# Patient Record
Sex: Male | Born: 1945
Health system: Southern US, Community
[De-identification: ages and names within clinical notes are randomized; demographics above are authoritative.]

## PROBLEM LIST (undated history)

## (undated) DIAGNOSIS — T424X1A Poisoning by benzodiazepines, accidental (unintentional), initial encounter: Secondary | ICD-10-CM

## (undated) DIAGNOSIS — I639 Cerebral infarction, unspecified: Secondary | ICD-10-CM

## (undated) DIAGNOSIS — B1920 Unspecified viral hepatitis C without hepatic coma: Secondary | ICD-10-CM

## (undated) DIAGNOSIS — M47812 Spondylosis without myelopathy or radiculopathy, cervical region: Secondary | ICD-10-CM

## (undated) DIAGNOSIS — M5416 Radiculopathy, lumbar region: Secondary | ICD-10-CM

## (undated) DIAGNOSIS — F1910 Other psychoactive substance abuse, uncomplicated: Secondary | ICD-10-CM

## (undated) DIAGNOSIS — Z87442 Personal history of urinary calculi: Secondary | ICD-10-CM

## (undated) DIAGNOSIS — K219 Gastro-esophageal reflux disease without esophagitis: Secondary | ICD-10-CM

## (undated) DIAGNOSIS — G8929 Other chronic pain: Secondary | ICD-10-CM

## (undated) DIAGNOSIS — F419 Anxiety disorder, unspecified: Secondary | ICD-10-CM

## (undated) DIAGNOSIS — E782 Mixed hyperlipidemia: Secondary | ICD-10-CM

## (undated) DIAGNOSIS — G510 Bell's palsy: Secondary | ICD-10-CM

## (undated) DIAGNOSIS — N400 Enlarged prostate without lower urinary tract symptoms: Secondary | ICD-10-CM

## (undated) DIAGNOSIS — R2681 Unsteadiness on feet: Secondary | ICD-10-CM

## (undated) DIAGNOSIS — F329 Major depressive disorder, single episode, unspecified: Secondary | ICD-10-CM

## (undated) DIAGNOSIS — M47816 Spondylosis without myelopathy or radiculopathy, lumbar region: Secondary | ICD-10-CM

## (undated) DIAGNOSIS — C689 Malignant neoplasm of urinary organ, unspecified: Secondary | ICD-10-CM

## (undated) DIAGNOSIS — I739 Peripheral vascular disease, unspecified: Secondary | ICD-10-CM

## (undated) DIAGNOSIS — K759 Inflammatory liver disease, unspecified: Secondary | ICD-10-CM

## (undated) DIAGNOSIS — I10 Essential (primary) hypertension: Secondary | ICD-10-CM

## (undated) DIAGNOSIS — I700 Atherosclerosis of aorta: Secondary | ICD-10-CM

## (undated) DIAGNOSIS — R7303 Prediabetes: Secondary | ICD-10-CM

## (undated) DIAGNOSIS — M5136 Other intervertebral disc degeneration, lumbar region: Secondary | ICD-10-CM

## (undated) DIAGNOSIS — M51369 Other intervertebral disc degeneration, lumbar region without mention of lumbar back pain or lower extremity pain: Secondary | ICD-10-CM

## (undated) DIAGNOSIS — Z7902 Long term (current) use of antithrombotics/antiplatelets: Secondary | ICD-10-CM

## (undated) DIAGNOSIS — M549 Dorsalgia, unspecified: Secondary | ICD-10-CM

## (undated) DIAGNOSIS — E119 Type 2 diabetes mellitus without complications: Secondary | ICD-10-CM

## (undated) DIAGNOSIS — I6523 Occlusion and stenosis of bilateral carotid arteries: Secondary | ICD-10-CM

## (undated) DIAGNOSIS — F32A Depression, unspecified: Secondary | ICD-10-CM

## (undated) DIAGNOSIS — D649 Anemia, unspecified: Secondary | ICD-10-CM

## (undated) DIAGNOSIS — M1990 Unspecified osteoarthritis, unspecified site: Secondary | ICD-10-CM

## (undated) DIAGNOSIS — Z972 Presence of dental prosthetic device (complete) (partial): Secondary | ICD-10-CM

## (undated) DIAGNOSIS — N200 Calculus of kidney: Secondary | ICD-10-CM

## (undated) DIAGNOSIS — Z91148 Patient's other noncompliance with medication regimen for other reason: Secondary | ICD-10-CM

## (undated) HISTORY — DX: Long term (current) use of antithrombotics/antiplatelets: Z79.02

## (undated) HISTORY — DX: Spondylosis without myelopathy or radiculopathy, cervical region: M47.812

## (undated) HISTORY — PX: KIDNEY STONE SURGERY: SHX686

## (undated) HISTORY — DX: Patient's other noncompliance with medication regimen for other reason: Z91.148

## (undated) HISTORY — PX: CHOLECYSTECTOMY: SHX55

## (undated) NOTE — *Deleted (*Deleted)
08/11/2020 12:13 PM   SHELL YANDOW [birth year] [id number]  Referring provider: Barbette Reichmann, MD [address] [facility] [place],  [place] [address]  No chief complaint on file.   HPI: Mr. Beauregard is a 75 year old male with high risk hematuria, nephrolithiasis and BPH with LU TS who is s/p UroLift yesterday presents today for follow up.   History of hematuria (high risk) Smoker.  CTU 06/2018 normal adrenal glands. Bilateral renal collecting system calculi. Largest stone is in the lower pole right collecting system at 8 mm. There is also a 5 mm stone at the right ureteropelvic junction and a left renal pelvic 8 mm stone. No hydroureter or ureteric calculi. No bladder calculi.  Too small to characterize lesions within both kidneys. An upper pole left renal lesion measures 1.0 cm and is most consistent with a minimally complex cyst. Good renal collecting system opacification on delayed images. Good ureteric opacification, without filling defect.  No enhancing bladder mass or filling defect on delayed images.   Cystoscopy 06/2018 with Dr. Lonna Cobb NED.     Nephrolithiasis Stones found on CTU 06/2018 Bilateral renal collecting system calculi. Largest stone is in the lower pole right collecting system at 8 mm. There is also a 5 mm stone at the right ureteropelvic junction and a left renal pelvic 8 mm stone.  Had left URS in 07/2018 with Dr. Lonna Cobb and left ESWL in 08/2018.  Right ESWL in 09/2018 with Dr. Richardo Hanks.  CT renal stone 12/23/2019 no adrenal nodule. Multiple, at least 4, nonobstructing stones in the left kidney, largest in the lower pole measuring 7 mm. No ureteral calculi or hydronephrosis. Scattered low-density lesions in the left kidney incompletely characterized without contrast but likely cysts. No right hydronephrosis or renal calculi. Urinary bladder is decompressed by Foley catheter and not well assessed.  BPH WITH LUTS  (prostate and/or bladder) IPSS score:  *** PVR: ***   Previous score: ***   Previous PVR: ***   Major complaint(s):  x *** years.  Patient denies any modifying or aggravating factors.  Patient denies any gross hematuria, dysuria or suprapubic/flank pain.  Patient denies any fevers, chills, nausea or vomiting.  Currently taking: ***.  His has had UroLift 12/2019  He has a family history of PCa, with ***.   He does not have a family history of PCa.***    Score:  1-7 Mild 8-19 Moderate 20-35 Severe   PMH: Past Medical History:  Diagnosis Date  . Anxiety   . Arthritis    DDD, lumbar radiculopathy  . Bell's palsy    hx of, right side  . Bell's palsy   . Chronic back pain    followed by pain clinic at [facility]  . Depression    hx of depression  . GERD (gastroesophageal reflux disease)   . Hepatitis    hx of hepatitis C treated with Harvoni per patient  . History of kidney stones   . Hypertension   . Kidney stone    hx of  . Pre-diabetes   . Stroke (HCC) 02/2018   loss of peripheral vision and balance unsteady  . Substance abuse [facility])    heroin    Surgical History: Past Surgical History:  Procedure Laterality Date  . ANTERIOR LUMBAR FUSION  05/13/2012   Procedure: ANTERIOR LUMBAR FUSION 1 LEVEL;  Surgeon: Venita Lick, MD;  Location: [facility] OR;  Service: Orthopedics;  Laterality: N/A;  ALIF L5-S1  . CHOLECYSTECTOMY    .  CYSTOSCOPY W/ RETROGRADES Left 08/04/2018   Procedure: CYSTOSCOPY WITH RETROGRADE PYELOGRAM;  Surgeon: Riki Altes, MD;  Location: [facility] ORS;  Service: Urology;  Laterality: Left;  . CYSTOSCOPY WITH INSERTION OF UROLIFT N/A 01/18/2020   Procedure: CYSTOSCOPY WITH INSERTION OF UROLIFT;  Surgeon: Riki Altes, MD;  Location: [facility] ORS;  Service: Urology;  Laterality: N/A;  . CYSTOSCOPY WITH STENT PLACEMENT Left 08/04/2018   Procedure: CYSTOSCOPY WITH STENT PLACEMENT;  Surgeon: Riki Altes, MD;  Location: [facility] ORS;  Service: Urology;  Laterality: Left;  . EXTRACORPOREAL SHOCK WAVE  LITHOTRIPSY  2012  . EXTRACORPOREAL SHOCK WAVE LITHOTRIPSY Left 08/20/2018   Procedure: EXTRACORPOREAL SHOCK WAVE LITHOTRIPSY (ESWL);  Surgeon: Vanna Scotland, MD;  Location: [facility] ORS;  Service: Urology;  Laterality: Left; (cancelled)  . EXTRACORPOREAL SHOCK WAVE LITHOTRIPSY Left 08/27/2018   Procedure: EXTRACORPOREAL SHOCK WAVE LITHOTRIPSY (ESWL);  Surgeon: Riki Altes, MD;  Location: [facility] ORS;  Service: Urology;  Laterality: Left;  . EXTRACORPOREAL SHOCK WAVE LITHOTRIPSY Right 10/15/2018   Procedure: EXTRACORPOREAL SHOCK WAVE LITHOTRIPSY (ESWL);  Surgeon: Sondra Come, MD;  Location: [facility] ORS;  Service: Urology;  Laterality: Right;  . KIDNEY STONE SURGERY    . LUMBAR FUSION  05/13/2012    Home Medications:  Allergies as of 08/11/2020   No Known Allergies     Medication List       Accurate as of August 10, 2020 12:13 PM. If you have any questions, ask your nurse or doctor.        aspirin EC 81 MG tablet Take 1 tablet (81 mg total) by mouth daily.   atorvastatin 40 MG tablet Commonly known as: LIPITOR Take 1 tablet (40 mg total) by mouth daily at 6 PM. What changed: when to take this   baclofen 10 MG tablet Commonly known as: LIORESAL Take 10 mg by mouth 3 (three) times daily.   citalopram 10 MG tablet Commonly known as: CELEXA Take 10 mg by mouth daily.   cloNIDine 0.1 MG tablet Commonly known as: CATAPRES Take 0.1 mg by mouth 2 (two) times a week.   clopidogrel 75 MG tablet Commonly known as: PLAVIX Take 1 tablet (75 mg total) by mouth daily.   hydrOXYzine 25 MG capsule Commonly known as: VISTARIL TAKE 1 CAPSULE (25 MG TOTAL) BY MOUTH 2 (TWO) TIMES DAILY AS NEEDED FOR SEVERE ANXIETY SYMPTOMS What changed:   when to take this  additional instructions   LORazepam 0.5 MG tablet Commonly known as: ATIVAN Take 0.5 mg by mouth daily.   mometasone 0.1 % cream Commonly known as: ELOCON Apply 1 application topically See admin instructions. INTO EARS  DAILY   phenazopyridine 200 MG tablet Commonly known as: PYRIDIUM Take 1 tablet (200 mg total) by mouth 3 (three) times daily as needed (burning with urination).   tamsulosin 0.4 MG Caps capsule Commonly known as: Flomax Take 1 capsule (0.4 mg total) by mouth daily.   tamsulosin 0.4 MG Caps capsule Commonly known as: FLOMAX TAKE 1 CAPSULE BY MOUTH EVERY DAY   tiZANidine 4 MG tablet Commonly known as: ZANAFLEX Take 4 mg by mouth in the morning, at noon, and at bedtime.   traMADol 50 MG tablet Commonly known as: ULTRAM Take 50 mg by mouth 2 (two) times daily.   traZODone 150 MG tablet Commonly known as: DESYREL Take 1 tablet (150 mg total) by mouth at bedtime.       Allergies: No Known Allergies  Family History: Family History  Problem Relation Age  of Onset  . Stroke Mother   . Diabetes Mother   . Lung cancer Father   . Diabetes Father     Social History:  reports that he has been smoking cigarettes. He has a 13.25 pack-year smoking history. He has never used smokeless tobacco. He reports previous drug use. Drugs: Heroin, Cocaine, and Marijuana. He reports that he does not drink alcohol.  ROS: Pertinent ROS in HPI  Physical Exam: There were no vitals taken for this visit.  Constitutional:  Well nourished. Alert and oriented, No acute distress. HEENT: [place] AT, moist mucus membranes.  Trachea midline Cardiovascular: No clubbing, cyanosis, or edema. Respiratory: Normal respiratory effort, no increased work of breathing. GI: Abdomen is soft, non tender, non distended, no abdominal masses. Liver and spleen not palpable.  No hernias appreciated.  Stool sample for occult testing is not indicated.   GU: No CVA tenderness.  No bladder fullness or masses.  Patient with circumcised/uncircumcised phallus. ***Foreskin easily retracted***  Urethral meatus is patent.  No penile discharge. No penile lesions or rashes. Scrotum without lesions, cysts, rashes and/or edema.  Testicles are  located scrotally bilaterally. No masses are appreciated in the testicles. Left and right epididymis are normal. Rectal: Patient with  normal sphincter tone. Anus and perineum without scarring or rashes. No rectal masses are appreciated. Prostate is approximately *** grams, *** nodules are appreciated. Seminal vesicles are normal. Skin: No rashes, bruises or suspicious lesions. Lymph: No inguinal adenopathy. Neurologic: Grossly intact, no focal deficits, moving all 4 extremities. Psychiatric: Normal mood and affect.  Laboratory Data: Lab Results  Component Value Date   WBC 9.9 04/24/2020   HGB 14.0 04/24/2020   HCT 40.5 04/24/2020   MCV 86.9 04/24/2020   PLT 175 04/24/2020   Lab Results  Component Value Date   CREATININE 0.85 06/03/2018      Component Value Date/Time   CHOL 200 03/13/2018 0702   HDL 20 (L) 03/13/2018 0702   CHOLHDL 10.0 03/13/2018 0702   VLDL 49 (H) 03/13/2018 0702   LDLCALC 131 (H) 03/13/2018 0702   Lab Results  Component Value Date   AST 23 03/12/2018   Lab Results  Component Value Date   ALT 15 (L) 03/12/2018   I have reviewed the labs.   Pertinent Imaging: ***  Assessment & Plan:    1. Dysuria Was concerned that he was in retention, but reassured him that his PVR was minimal - BLADDER SCAN AMB NON-IMAGING Patient was not able to provide an urine specimen at today's visit, he is given a cup and will bring a specimen back in the morning His symptoms seem to coincide to when he ran out of the tamsulosin, so I went ahead and sent a refill to his pharmacy  Has a follow up appointment on 02/10/2020 with Dr. Lonna Cobb    No follow-ups on file.  These notes generated with voice recognition software. I apologize for typographical errors.  Michiel Cowboy, PA-C  [facility] [address]  [address] [place], [place] [address] [phone]

---

## 2010-05-17 ENCOUNTER — Emergency Department (HOSPITAL_COMMUNITY): Admission: EM | Admit: 2010-05-17 | Discharge: 2010-05-17 | Payer: Self-pay | Admitting: Emergency Medicine

## 2010-07-11 ENCOUNTER — Encounter
Admission: RE | Admit: 2010-07-11 | Discharge: 2010-07-11 | Payer: Self-pay | Source: Home / Self Care | Attending: Physical Medicine & Rehabilitation | Admitting: Physical Medicine & Rehabilitation

## 2010-10-21 HISTORY — PX: EXTRACORPOREAL SHOCK WAVE LITHOTRIPSY: SHX1557

## 2010-11-30 ENCOUNTER — Ambulatory Visit (HOSPITAL_COMMUNITY): Payer: Medicare Other

## 2010-12-02 ENCOUNTER — Ambulatory Visit (HOSPITAL_COMMUNITY): Payer: Medicare Other

## 2010-12-03 ENCOUNTER — Ambulatory Visit (HOSPITAL_COMMUNITY)
Admission: RE | Admit: 2010-12-03 | Discharge: 2010-12-03 | Disposition: A | Discharge status: Home / Self Care | Payer: Medicare Other | Source: Ambulatory Visit | Attending: Urology | Admitting: Urology

## 2010-12-03 DIAGNOSIS — N200 Calculus of kidney: Secondary | ICD-10-CM | POA: Insufficient documentation

## 2012-04-08 ENCOUNTER — Other Ambulatory Visit: Payer: Self-pay

## 2012-04-22 ENCOUNTER — Other Ambulatory Visit: Payer: Self-pay

## 2012-04-27 ENCOUNTER — Encounter (HOSPITAL_COMMUNITY): Payer: Self-pay | Admitting: Pharmacy Technician

## 2012-04-27 DIAGNOSIS — M5136 Other intervertebral disc degeneration, lumbar region: Secondary | ICD-10-CM | POA: Diagnosis present

## 2012-04-27 NOTE — H&P (Signed)
[name] 04/27/2012 9:23 AM [facility] Patient #: [id number] DOB: [birth year] Divorced / Language: [place] / Race: Black or African American Male   History of Present Illness(Philicia Heyne Dierdre Highman, PA-C; 04/27/2012 10:10 AM) The patient is a 66 year old male who comes in today for a preoperative History and Physical. The patient is scheduled for a ALIF L5-S1 (discogenic low back pain with radiculopathy) to be performed by Dr. Debria Garret D. Shon Baton, MD at [facility] on May 13, 2012 at 0830 . Please see the hospital record for complete dictated history and physical.    Allergies(Sharon Gillian Shields; 04/27/2012 9:28 AM) No Known Drug Allergies. 07/04/2011   Family History(Sharon J Roxan Hockey; 04/27/2012 10:43 AM) Cancer. father Cerebrovascular Accident. mother Diabetes Mellitus. mother and father Hypertension. mother and father   Social History(Sharon Gillian Shields; 04/27/2012 10:43 AM) Tobacco use. Current every day smoker, Smokes < 1 pack of cigarettes per day. current every day smoker; smoke(d) less than 1/2 pack(s) per day Alcohol use. current drinker; only occasionally per week Children. 4 Current work status. disabled Drug/Alcohol Rehab (Currently). no Drug/Alcohol Rehab (Previously). no Exercise. Exercises never; does running / walking Illicit drug use. no Living situation. live with partner Marital status. partnered Pain Contract. yes Tobacco / smoke exposure. yes   Medication History(Sharon Gillian Shields; 04/27/2012 9:29 AM) TraZODone HCl (50MG  Tablet, Oral) Active. Spironolactone (25MG  Tablet, Oral) Active. Ultram (50MG  Tablet, Oral) Active.   Past Surgical History(Sharon Gillian Shields; 04/27/2012 10:43 AM) Gallbladder Surgery - Laparoscopic Lithotripsy Gallbladder Surgery. laporoscopic   Other Problems(Sharon Gillian Shields; 04/27/2012 10:43 AM) HEP. C Hypertension Anxiety Disorder Depression Hepatitis C High blood  pressure   Review of Systems(Sharon Gillian Shields; 04/27/2012 10:43 AM) General:Present- Appetite Loss. Not Present- Chills, Fever, Night Sweats, Fatigue, Feeling sick, Weight Gain and Weight Loss. Skin:Not Present- Itching, Rash, Skin Color Changes, Ulcer, Psoriasis and Change in Hair or Nails. HEENT:Not Present- Sensitivity to light, Hearing problems, Nose Bleed and Ringing in the Ears. Neck:Not Present- Swollen Glands and Neck Mass. Respiratory:Not Present- Snoring, Chronic Cough, Bloody sputum and Dyspnea. Cardiovascular:Not Present- Shortness of Breath, Chest Pain, Swelling of Extremities, Leg Cramps and Palpitations. Gastrointestinal:Not Present- Bloody Stool, Heartburn, Abdominal Pain, Vomiting, Nausea and Incontinence of Stool. Male Genitourinary:Not Present- Blood in Urine, Frequency, Incontinence and Nocturia. Musculoskeletal:Present- Back Pain. Not Present- Muscle Weakness, Muscle Pain, Joint Stiffness, Joint Swelling and Joint Pain. Neurological:Not Present- Tingling, Numbness, Burning, Tremor, Headaches and Dizziness. Psychiatric:Present- Anxiety and Depression. Not Present- Memory Loss. Endocrine:Not Present- Cold Intolerance, Heat Intolerance, Excessive hunger and Excessive Thirst. Hematology:Not Present- Abnormal Bleeding, Anemia, Blood Clots and Easy Bruising.   Vitals(Sharon Gillian Shields; 04/27/2012 9:31 AM) 04/27/2012 9:30 AM Weight: 150 lb Height: 63 in Body Surface Area: 1.74 m Body Mass Index: 26.57 kg/m BP: 164/95 (Sitting, Left Arm, Standard)    Physical Exam(Paisley Grajeda J Elisandro Jarrett, PA-C; 04/27/2012 10:35 AM) The physical exam findings are as follows:   General General Appearance- pleasant. Not in acute distress. Orientation- Oriented X3. Build & Nutrition- Well nourished and Well developed. Posture- Normal posture. Gait- Normal. Mental Status- Alert.   Integumentary Global Assessment:Upon inspection and palpation of skin surfaces of the -  Abdomen: no scars, rashes or lesions. Lumbar Spine- Skin examination of the lumbar spine is without deformity, skin lesions, lacerations or abrasions.   Head and Neck Neck Global Assessment- supple. no lymphadenopathy and no nucchal rigidty.   Eye Pupil- Bilateral- Normal, Direct reaction to light normal, Equal and Regular. Motion- Bilateral- EOMI.   Chest and  Lung Exam Auscultation: Breath sounds:- Clear.   Cardiovascular Auscultation:Rhythm- Regular rate and rhythm. Heart Sounds- Normal heart sounds.   Abdomen Palpation/Percussion:Palpation and Percussion of the abdomen reveal - Non Tender, No Rebound tenderness and Soft.   Peripheral Vascular Lower Extremity:Inspection- Bilateral- Inspection Normal. Palpation:Posterior tibial pulse- Bilateral- 1+. Dorsalis pedis pulse- Bilateral- 1+.   Neurologic Sensation:Lower Extremity- Bilateral- sensation is intact in the lower extremity. Reflexes:Patellar Reflex- Bilateral- 2+. Achilles Reflex- Bilateral- 2+. Babinski- Bilateral- Babinski not present. Clonus- Bilateral- clonus not present. Hoffman's Sign- Bilateral- Hoffman's sign not present. Testing:Seated Straight Leg Raise- Bilateral- Seated straight leg raise negative.   Musculoskeletal Spine/Ribs/Pelvis Lumbosacral Spine:Inspection and Palpation- Tenderness- generalized. bony and soft tissue palpation of the lumbar spine and SI joint does not recreate their typical pain. Strength and Tone: Strength:Hip Flexion- Bilateral- 5/5. Knee Extension- Bilateral- 5/5. Knee Flexion- Bilateral- 5/5. Ankle Dorsiflexion- Bilateral- 5/5. Ankle Plantarflexion- Bilateral- 5/5. Heel walk- Bilateral- able to heel walk without difficulty. Toe Walk- Bilateral- able to walk on toes without difficulty. Heel-Toe Walk- Bilateral- able to heel-toe walk without difficulty. ROM- Flexion- severely decreased range of motion and painful.  Extension- severely decreased range of motion and painful. Pain:- neither flexion or extension is more painful than the other. Waddell's Signs- no Waddell's signs present. Lower Extremity Range of Motion:- No true hip, knee or ankle pain with range of motion. Gait and Station:Assistive Devices- no assistive devices.   Assessment & Plan(Orville Widmann J Micha Dosanjh, PA-C; 04/27/2012 10:45 AM) Lumbar/Lumbosacral Disc Degeneration (722.52)  Chronic pain syndrome (338.4)  Lumbago (724.2)  Note: Unfortunately conservative measures consisting of observation, activity modification, physical therapy, oral pain medications and injection therapy have failed to alleviate his pain. Given the ongoing nature of his symptoms and the significant decrease in his quality of life, he wishes to proceed with surgery. Risks/benefits/alternatives to the procedure and expectations following the procedure have been reviewed with the patient by Dr. Shon Baton.  MRI of the lumbar spine from May of this year demonstrates mild central stenosis, mild recess stenosis and foraminal stenosis at L3-4 and L4-5. There is moderate, in my opinion severe, foraminal stenosis at L5-S1 with a significant loss of disk space height and retrolisthesis at L5 on S1. Please see the scanned report for the remaining specifics.   He has been medically cleared by Dr. Sandi Mealy. Please see the scanned clearance form for the specifics. His blood pressure today is slightly elevated but he does report that it is otherwise under control. He has been fitted for a lumbar corsett brace. He knows to bring this with him on the morning of surgery. He has not yet been scheduled for a pre-op appointment with the hospital. I have advised him to contact us if he has not heard from them by mid week next week so that we may follow up with them regarding this.   All of his questions have been encouraged, addressed and answered. Plan, at this time, is to proceed with  surgery as scheduled.   Signed electronically by Gwinda Maine, PA-C (04/27/2012 10:46 AM)  [name] 03/13/2012 1:45 PM [facility] Patient #: [id number] DOB: Nov 15, 1945 Divorced / Language: [place] / Race: Black or African American Male   History of Present Illness(Lori Zipporah Plants; 03/13/2012 1:47 PM) The patient is a 66 year old male who presents today for follow up of their back. The patient is being followed for their central back pain. Symptoms reported today include: pain (about the lower lumbar region with radiation into the  left groin and mid hamstring ) and weakness (bilat. lower ext. ), while the patient does not report symptoms of: numbness. The patient states that they are doing poorly. Current treatment includes: relative rest, activity modification and pain medications. The following medication has been used for pain control: Percocet (7.5/325mg ) and Ultram. The patient presents today following MRI.    Subjective Transcription(DAHARI Sheela Stack, MD; 03/19/2012 8:55 PM)  Thurlow returns today for a followup. He indicates he saw his primary care physician and he has been cleared for surgical intervention. He continues to have severe debilitating back pain.        Allergies(Lori W Randa Lynn; 03/13/2012 1:47 PM) No Known Drug Allergies. 07/04/2011   Social History(Lori W Randa Lynn; 03/13/2012 1:47 PM) Tobacco use. Current every day smoker, Smokes < 1 pack of cigarettes per day.   Medication History(Lori W Randa Lynn; 03/13/2012 1:48 PM) Percocet (7.5-325MG  Tablet, Oral one pill po tid prn pain, Taken 11/27/2010 to 07/04/2011) Inactive. TraZODone HCl (50MG  Tablet, Oral) Active. Ultram (50MG  Tablet, Oral) Active.   Objective Transcription(DAHARI D BROOKS, MD; 03/19/2012 8:55 PM)  RADIOGRAPHS:  The new MRI dated 03-03-12 demonstrates mild central stenosis, mild recess stenosis and foraminal stenosis at L3-4 and L4-5. There is moderate, in my opinion  severe, foraminal stenosis at L5-S1 with a significant loss of disk space height and retrolisthesis at L5 on S1.        Assessment & Plan(DAHARI D BROOKS, MD; 03/13/2012 2:24 PM) Spondylosis, lumbosacral (721.3)  Lumbar/Lumbosacral Disc Degeneration (722.52)   Plans Transcription(DAHARI D BROOKS, MD; 03/19/2012 8:55 PM)  At this point despite injection therapy, physical therapy, activity modification and medications he continues to have significant discogenic back and episodic radicular leg pain. As a result of this, having failed conservative management, he would like to proceed with surgery. I still do believe the L5-S1 disk is the predominant source of pain. While there are changes at L4-5 and L3-4 I do not think they are significant enough that they would be major pain generators.    I did review the risk with him to include infection, bleeding, nerve damage, death , stroke, paralysis, ongoing or worse pain, loss of bowel and/or bladder control, injury to the bowel and bladder, need for further surgery, leakage of spinal fluid and blood clots.    At this point, he has expressed an understanding of the surgical procedure. The surgery itself will be an anterior lumbar interbody fusion at L5-S1 with the assistance of a vascular surgeon for the approach. He will more than likely need a home health service after the surgery. He will also require ten days of Lovenox and eventually physical therapy. He will probably be in the hospital for  3-4 nights with a discharge to home. He has expressed an understanding of the risks, benefits and reason for surgery.    We will plan on proceeding hopefully in the next month to six weeks.    Miscellaneous Transcription(DAHARI Sheela Stack, MD; 03/19/2012 8:55 PM)  Debria Garret D. Shon Baton, MD/jgc    T: 03-19-12  D: 03-13-12    Signed electronically by Alvy Beal, MD (03/13/2012 5:42 PM)

## 2012-05-07 ENCOUNTER — Encounter (HOSPITAL_COMMUNITY)
Admission: RE | Admit: 2012-05-07 | Discharge: 2012-05-07 | Disposition: A | Discharge status: Home / Self Care | Payer: Medicare Other | Source: Ambulatory Visit | Attending: Orthopedic Surgery | Admitting: Orthopedic Surgery

## 2012-05-07 ENCOUNTER — Encounter (HOSPITAL_COMMUNITY)
Admission: RE | Admit: 2012-05-07 | Discharge: 2012-05-07 | Disposition: A | Discharge status: Home / Self Care | Payer: Medicare Other | Source: Ambulatory Visit | Attending: Physician Assistant | Admitting: Physician Assistant

## 2012-05-07 ENCOUNTER — Encounter (HOSPITAL_COMMUNITY): Payer: Self-pay

## 2012-05-07 HISTORY — DX: Anxiety disorder, unspecified: F41.9

## 2012-05-07 HISTORY — DX: Inflammatory liver disease, unspecified: K75.9

## 2012-05-07 HISTORY — DX: Calculus of kidney: N20.0

## 2012-05-07 HISTORY — DX: Unspecified osteoarthritis, unspecified site: M19.90

## 2012-05-07 HISTORY — DX: Bell's palsy: G51.0

## 2012-05-07 HISTORY — DX: Depression, unspecified: F32.A

## 2012-05-07 HISTORY — DX: Essential (primary) hypertension: I10

## 2012-05-07 HISTORY — DX: Major depressive disorder, single episode, unspecified: F32.9

## 2012-05-07 LAB — DIFFERENTIAL
Basophils Absolute: 0 10*3/uL (ref 0.0–0.1)
Basophils Relative: 0 % (ref 0–1)
Eosinophils Absolute: 0.1 10*3/uL (ref 0.0–0.7)
Eosinophils Relative: 1 % (ref 0–5)
Lymphocytes Relative: 18 % (ref 12–46)
Lymphs Abs: 1.7 10*3/uL (ref 0.7–4.0)
Monocytes Absolute: 0.5 10*3/uL (ref 0.1–1.0)
Monocytes Relative: 5 % (ref 3–12)
Neutro Abs: 7.2 10*3/uL (ref 1.7–7.7)
Neutrophils Relative %: 76 % (ref 43–77)

## 2012-05-07 LAB — CBC
HCT: 43.9 % (ref 39.0–52.0)
Hemoglobin: 14.9 g/dL (ref 13.0–17.0)
MCH: 31.6 pg (ref 26.0–34.0)
MCHC: 33.9 g/dL (ref 30.0–36.0)
MCV: 93.2 fL (ref 78.0–100.0)
Platelets: 149 10*3/uL — ABNORMAL LOW (ref 150–400)
RBC: 4.71 MIL/uL (ref 4.22–5.81)
RDW: 13.4 % (ref 11.5–15.5)
WBC: 9.4 10*3/uL (ref 4.0–10.5)

## 2012-05-07 LAB — COMPREHENSIVE METABOLIC PANEL
AST: 33 U/L (ref 0–37)
Albumin: 3.4 g/dL — ABNORMAL LOW (ref 3.5–5.2)
BUN: 10 mg/dL (ref 6–23)
Calcium: 9.1 mg/dL (ref 8.4–10.5)
Chloride: 102 mEq/L (ref 96–112)
Creatinine, Ser: 0.79 mg/dL (ref 0.50–1.35)
GFR calc non Af Amer: >90 mL/min (ref >90)
Total Bilirubin: 0.2 mg/dL — ABNORMAL LOW (ref 0.3–1.2)

## 2012-05-07 LAB — TYPE AND SCREEN
ABO/RH(D): A POS
Antibody Screen: NEGATIVE

## 2012-05-07 LAB — SURGICAL PCR SCREEN
MRSA, PCR: NEGATIVE
Staphylococcus aureus: NEGATIVE

## 2012-05-07 LAB — PROTIME-INR: INR: 1.08 (ref 0.00–1.49)

## 2012-05-07 NOTE — Pre-Procedure Instructions (Signed)
20 [name]  05/07/2012   Your procedure is scheduled on:  Wednesday May 13, 2012  Report to [facility] at 0630 AM.  Call this number if you have problems the morning of surgery: [phone]   Remember:   Do not eat food or drink liquids :After Midnight.      Take these medicines the morning of surgery with A SIP OF WATER: none   Do not wear jewelry, make-up or nail polish.  Do not wear lotions, powders, or perfumes. You may wear deodorant.  Do not shave 48 hours prior to surgery. Men may shave face and neck.  Do not bring valuables to the hospital.  Contacts, dentures or bridgework may not be worn into surgery.  Leave suitcase in the car. After surgery it may be brought to your room.  For patients admitted to the hospital, checkout time is 11:00 AM the day of discharge.   Patients discharged the day of surgery will not be allowed to drive home.  Name and phone number of your driver: *[name] [phone]  Special Instructions: Incentive Spirometry - Practice and bring it with you on the day of surgery. and CHG Shower Use Special Wash: 1/2 bottle night before surgery and 1/2 bottle morning of surgery.   Please read over the following fact sheets that you were given: Pain Booklet, Coughing and Deep Breathing, Blood Transfusion Information, MRSA Information and Surgical Site Infection Prevention

## 2012-05-12 MED ORDER — CEFAZOLIN SODIUM-DEXTROSE 2-3 GM-% IV SOLR
2.0000 g | INTRAVENOUS | Status: AC
  Administered 2012-05-13: 2 g via INTRAVENOUS
  Filled 2012-05-12: qty 50

## 2012-05-13 ENCOUNTER — Encounter (HOSPITAL_COMMUNITY): Payer: Self-pay | Admitting: Anesthesiology

## 2012-05-13 ENCOUNTER — Inpatient Hospital Stay (HOSPITAL_COMMUNITY): Payer: Medicare Other | Admitting: Anesthesiology

## 2012-05-13 ENCOUNTER — Encounter (HOSPITAL_COMMUNITY)
Admission: RE | Disposition: A | Discharge status: Home / Self Care | Payer: Self-pay | Source: Ambulatory Visit | Attending: Orthopedic Surgery

## 2012-05-13 ENCOUNTER — Inpatient Hospital Stay (HOSPITAL_COMMUNITY): Payer: Medicare Other

## 2012-05-13 ENCOUNTER — Inpatient Hospital Stay (HOSPITAL_COMMUNITY)
Admission: RE | Admit: 2012-05-13 | Discharge: 2012-05-14 | DRG: 460 | Disposition: A | Discharge status: Home Health | Payer: Medicare Other | Source: Ambulatory Visit | Attending: Orthopedic Surgery | Admitting: Orthopedic Surgery

## 2012-05-13 ENCOUNTER — Encounter (HOSPITAL_COMMUNITY): Payer: Self-pay | Admitting: Surgery

## 2012-05-13 DIAGNOSIS — Z833 Family history of diabetes mellitus: Secondary | ICD-10-CM

## 2012-05-13 DIAGNOSIS — Z8249 Family history of ischemic heart disease and other diseases of the circulatory system: Secondary | ICD-10-CM

## 2012-05-13 DIAGNOSIS — F329 Major depressive disorder, single episode, unspecified: Secondary | ICD-10-CM | POA: Diagnosis present

## 2012-05-13 DIAGNOSIS — F411 Generalized anxiety disorder: Secondary | ICD-10-CM | POA: Diagnosis present

## 2012-05-13 DIAGNOSIS — I10 Essential (primary) hypertension: Secondary | ICD-10-CM | POA: Diagnosis present

## 2012-05-13 DIAGNOSIS — B1920 Unspecified viral hepatitis C without hepatic coma: Secondary | ICD-10-CM | POA: Diagnosis present

## 2012-05-13 DIAGNOSIS — M549 Dorsalgia, unspecified: Secondary | ICD-10-CM

## 2012-05-13 DIAGNOSIS — M51379 Other intervertebral disc degeneration, lumbosacral region without mention of lumbar back pain or lower extremity pain: Principal | ICD-10-CM | POA: Diagnosis present

## 2012-05-13 DIAGNOSIS — F3289 Other specified depressive episodes: Secondary | ICD-10-CM | POA: Diagnosis present

## 2012-05-13 DIAGNOSIS — Z823 Family history of stroke: Secondary | ICD-10-CM

## 2012-05-13 DIAGNOSIS — F17200 Nicotine dependence, unspecified, uncomplicated: Secondary | ICD-10-CM | POA: Diagnosis present

## 2012-05-13 DIAGNOSIS — Z01812 Encounter for preprocedural laboratory examination: Secondary | ICD-10-CM

## 2012-05-13 DIAGNOSIS — M5137 Other intervertebral disc degeneration, lumbosacral region: Principal | ICD-10-CM | POA: Diagnosis present

## 2012-05-13 DIAGNOSIS — M5136 Other intervertebral disc degeneration, lumbar region: Secondary | ICD-10-CM | POA: Diagnosis present

## 2012-05-13 HISTORY — PX: ANTERIOR LUMBAR FUSION: SHX1170

## 2012-05-13 HISTORY — PX: LUMBAR FUSION: SHX111

## 2012-05-13 SURGERY — ANTERIOR LUMBAR FUSION 1 LEVEL
Anesthesia: General | Site: Abdomen | Wound class: Clean

## 2012-05-13 MED ORDER — ONDANSETRON HCL 4 MG/2ML IJ SOLN
INTRAMUSCULAR | Status: DC | PRN
  Administered 2012-05-13: 4 mg via INTRAVENOUS

## 2012-05-13 MED ORDER — HYDROMORPHONE HCL PF 1 MG/ML IJ SOLN
0.2500 mg | INTRAMUSCULAR | Status: DC | PRN
  Administered 2012-05-13 (×4): 0.5 mg via INTRAVENOUS

## 2012-05-13 MED ORDER — SODIUM CHLORIDE 0.9 % IJ SOLN: 9.0000 mL | INTRAMUSCULAR | Status: DC | PRN

## 2012-05-13 MED ORDER — DEXTROSE 5 % IV SOLN
500.0000 mg | Freq: Four times a day (QID) | INTRAVENOUS | Status: DC | PRN
  Filled 2012-05-13: qty 5

## 2012-05-13 MED ORDER — LACTATED RINGERS IV SOLN
INTRAVENOUS | Status: DC | PRN
  Administered 2012-05-13 (×2): via INTRAVENOUS

## 2012-05-13 MED ORDER — LACTATED RINGERS IV SOLN
INTRAVENOUS | Status: DC
Start: 2012-05-13 — End: 2012-05-13

## 2012-05-13 MED ORDER — DOCUSATE SODIUM 100 MG PO CAPS
100.0000 mg | ORAL_CAPSULE | Freq: Two times a day (BID) | ORAL | Status: DC
  Administered 2012-05-13 – 2012-05-14 (×3): 100 mg via ORAL
  Filled 2012-05-13 (×2): qty 1

## 2012-05-13 MED ORDER — DEXAMETHASONE 4 MG PO TABS
4.0000 mg | ORAL_TABLET | Freq: Four times a day (QID) | ORAL | Status: DC
Start: 2012-05-13 — End: 2012-05-14
  Administered 2012-05-13 – 2012-05-14 (×4): 4 mg via ORAL
  Filled 2012-05-13 (×7): qty 1

## 2012-05-13 MED ORDER — ENOXAPARIN SODIUM 40 MG/0.4ML ~~LOC~~ SOLN
40.0000 mg | SUBCUTANEOUS | Status: DC
  Administered 2012-05-14: 40 mg via SUBCUTANEOUS
  Filled 2012-05-13 (×2): qty 0.4

## 2012-05-13 MED ORDER — NICOTINE 7 MG/24HR TD PT24
7.0000 mg | MEDICATED_PATCH | Freq: Every day | TRANSDERMAL | Status: DC
  Administered 2012-05-13 – 2012-05-14 (×2): 7 mg via TRANSDERMAL
  Filled 2012-05-13 (×3): qty 1

## 2012-05-13 MED ORDER — CEFAZOLIN SODIUM 1-5 GM-% IV SOLN
1.0000 g | Freq: Three times a day (TID) | INTRAVENOUS | Status: AC
  Administered 2012-05-13 – 2012-05-14 (×2): 1 g via INTRAVENOUS
  Filled 2012-05-13 (×2): qty 50

## 2012-05-13 MED ORDER — HYDROMORPHONE HCL PF 1 MG/ML IJ SOLN
INTRAMUSCULAR | Status: AC
  Administered 2012-05-13: 0.5 mg via INTRAVENOUS
  Filled 2012-05-13: qty 1

## 2012-05-13 MED ORDER — SODIUM CHLORIDE 0.9 % IV SOLN: 250.0000 mL | INTRAVENOUS | Status: DC

## 2012-05-13 MED ORDER — DEXAMETHASONE SODIUM PHOSPHATE 10 MG/ML IJ SOLN
10.0000 mg | Freq: Once | INTRAMUSCULAR | Status: AC
  Administered 2012-05-13: 10 mg via INTRAVENOUS
  Filled 2012-05-13: qty 1

## 2012-05-13 MED ORDER — ONDANSETRON HCL 4 MG/2ML IJ SOLN: 4.0000 mg | INTRAMUSCULAR | Status: DC | PRN

## 2012-05-13 MED ORDER — SODIUM CHLORIDE 0.9 % IJ SOLN
3.0000 mL | Freq: Two times a day (BID) | INTRAMUSCULAR | Status: DC
Start: 2012-05-13 — End: 2012-05-14
  Administered 2012-05-13 – 2012-05-14 (×2): 3 mL via INTRAVENOUS

## 2012-05-13 MED ORDER — THROMBIN 20000 UNITS EX KIT
PACK | CUTANEOUS | Status: DC | PRN
  Administered 2012-05-13: 10:00:00 via TOPICAL

## 2012-05-13 MED ORDER — GLYCOPYRROLATE 0.2 MG/ML IJ SOLN
INTRAMUSCULAR | Status: DC | PRN
  Administered 2012-05-13: 0.6 mg via INTRAVENOUS
  Administered 2012-05-13: 0.2 mg via INTRAVENOUS

## 2012-05-13 MED ORDER — VECURONIUM BROMIDE 10 MG IV SOLR
INTRAVENOUS | Status: DC | PRN
  Administered 2012-05-13: 7 mg via INTRAVENOUS
  Administered 2012-05-13 (×2): 1 mg via INTRAVENOUS

## 2012-05-13 MED ORDER — ONDANSETRON HCL 4 MG/2ML IJ SOLN: 4.0000 mg | Freq: Four times a day (QID) | INTRAMUSCULAR | Status: DC | PRN

## 2012-05-13 MED ORDER — MENTHOL 3 MG MT LOZG: 1.0000 | LOZENGE | OROMUCOSAL | Status: DC | PRN

## 2012-05-13 MED ORDER — ZOLPIDEM TARTRATE 5 MG PO TABS
5.0000 mg | ORAL_TABLET | Freq: Every evening | ORAL | Status: DC | PRN
  Administered 2012-05-13: 5 mg via ORAL
  Filled 2012-05-13: qty 1

## 2012-05-13 MED ORDER — SPIRONOLACTONE 25 MG PO TABS
25.0000 mg | ORAL_TABLET | Freq: Two times a day (BID) | ORAL | Status: DC
  Administered 2012-05-13 – 2012-05-14 (×3): 25 mg via ORAL
  Filled 2012-05-13 (×4): qty 1

## 2012-05-13 MED ORDER — 0.9 % SODIUM CHLORIDE (POUR BTL) OPTIME
TOPICAL | Status: DC | PRN
  Administered 2012-05-13: 1000 mL

## 2012-05-13 MED ORDER — MORPHINE SULFATE 10 MG/ML IJ SOLN
INTRAMUSCULAR | Status: DC | PRN
  Administered 2012-05-13 (×3): 2 mg via INTRAVENOUS

## 2012-05-13 MED ORDER — NALOXONE HCL 0.4 MG/ML IJ SOLN: 0.4000 mg | INTRAMUSCULAR | Status: DC | PRN

## 2012-05-13 MED ORDER — ACETAMINOPHEN 10 MG/ML IV SOLN
INTRAVENOUS | Status: AC
  Filled 2012-05-13: qty 100

## 2012-05-13 MED ORDER — LIDOCAINE HCL (CARDIAC) 20 MG/ML IV SOLN
INTRAVENOUS | Status: DC | PRN
  Administered 2012-05-13: 40 mg via INTRAVENOUS

## 2012-05-13 MED ORDER — DEXAMETHASONE SODIUM PHOSPHATE 4 MG/ML IJ SOLN
4.0000 mg | Freq: Four times a day (QID) | INTRAMUSCULAR | Status: DC
  Filled 2012-05-13 (×7): qty 1

## 2012-05-13 MED ORDER — METHOCARBAMOL 500 MG PO TABS
500.0000 mg | ORAL_TABLET | Freq: Four times a day (QID) | ORAL | Status: DC | PRN
  Administered 2012-05-13 – 2012-05-14 (×2): 500 mg via ORAL
  Filled 2012-05-13 (×2): qty 1

## 2012-05-13 MED ORDER — FLEET ENEMA 7-19 GM/118ML RE ENEM: 1.0000 | ENEMA | Freq: Once | RECTAL | Status: AC | PRN

## 2012-05-13 MED ORDER — FENTANYL CITRATE 0.05 MG/ML IJ SOLN
INTRAMUSCULAR | Status: DC | PRN
  Administered 2012-05-13 (×3): 50 ug via INTRAVENOUS
  Administered 2012-05-13: 100 ug via INTRAVENOUS

## 2012-05-13 MED ORDER — LACTATED RINGERS IV SOLN
INTRAVENOUS | Status: DC
  Administered 2012-05-13: 14:00:00 via INTRAVENOUS

## 2012-05-13 MED ORDER — MORPHINE SULFATE (PF) 1 MG/ML IV SOLN
INTRAVENOUS | Status: DC
  Administered 2012-05-13: 3 mg via INTRAVENOUS
  Administered 2012-05-13: 7 mg via INTRAVENOUS
  Administered 2012-05-14 (×2): 3 mg via INTRAVENOUS
  Filled 2012-05-13 (×2): qty 25

## 2012-05-13 MED ORDER — OXYCODONE HCL 5 MG PO TABS
10.0000 mg | ORAL_TABLET | ORAL | Status: DC | PRN
  Administered 2012-05-14 (×2): 10 mg via ORAL
  Filled 2012-05-13 (×3): qty 2

## 2012-05-13 MED ORDER — PROPOFOL 10 MG/ML IV EMUL
INTRAVENOUS | Status: DC | PRN
  Administered 2012-05-13: 140 mg via INTRAVENOUS
  Administered 2012-05-13: 30 mg via INTRAVENOUS

## 2012-05-13 MED ORDER — NEOSTIGMINE METHYLSULFATE 1 MG/ML IJ SOLN
INTRAMUSCULAR | Status: DC | PRN
  Administered 2012-05-13: 1 mg via INTRAVENOUS
  Administered 2012-05-13: 4 mg via INTRAVENOUS

## 2012-05-13 MED ORDER — METHOCARBAMOL 100 MG/ML IJ SOLN
500.0000 mg | INTRAVENOUS | Status: AC
  Administered 2012-05-13: 500 mg via INTRAVENOUS
  Filled 2012-05-13: qty 5

## 2012-05-13 MED ORDER — LACTATED RINGERS IV SOLN
INTRAVENOUS | Status: DC | PRN
  Administered 2012-05-13 (×2): via INTRAVENOUS

## 2012-05-13 MED ORDER — SODIUM CHLORIDE 0.9 % IJ SOLN: 3.0000 mL | INTRAMUSCULAR | Status: DC | PRN

## 2012-05-13 MED ORDER — ACETAMINOPHEN 10 MG/ML IV SOLN
1000.0000 mg | Freq: Once | INTRAVENOUS | Status: AC
  Administered 2012-05-13: 1000 mg via INTRAVENOUS
  Filled 2012-05-13: qty 100

## 2012-05-13 MED ORDER — DEXTROSE 5 % IV SOLN
INTRAVENOUS | Status: DC | PRN
  Administered 2012-05-13: 09:00:00 via INTRAVENOUS

## 2012-05-13 MED ORDER — MIDAZOLAM HCL 5 MG/5ML IJ SOLN
INTRAMUSCULAR | Status: DC | PRN
  Administered 2012-05-13 (×2): 1 mg via INTRAVENOUS

## 2012-05-13 MED ORDER — PHENOL 1.4 % MT LIQD: 1.0000 | OROMUCOSAL | Status: DC | PRN

## 2012-05-13 MED ORDER — DIPHENHYDRAMINE HCL 12.5 MG/5ML PO ELIX: 12.5000 mg | ORAL_SOLUTION | Freq: Four times a day (QID) | ORAL | Status: DC | PRN

## 2012-05-13 MED ORDER — DIPHENHYDRAMINE HCL 50 MG/ML IJ SOLN: 12.5000 mg | Freq: Four times a day (QID) | INTRAMUSCULAR | Status: DC | PRN

## 2012-05-13 SURGICAL SUPPLY — 89 items
APPLIER CLIP 11 MED OPEN (CLIP) ×2
BLADE SURG 10 STRL SS (BLADE) IMPLANT
BLADE SURG ROTATE 9660 (MISCELLANEOUS) ×2 IMPLANT
CLIP APPLIE 11 MED OPEN (CLIP) ×1 IMPLANT
CLIP LIGATING EXTRA MED SLVR (CLIP) IMPLANT
CLIP LIGATING EXTRA SM BLUE (MISCELLANEOUS) IMPLANT
CLOTH BEACON ORANGE TIMEOUT ST (SAFETY) ×4 IMPLANT
CORDS BIPOLAR (ELECTRODE) ×2 IMPLANT
COVER SURGICAL LIGHT HANDLE (MISCELLANEOUS) ×4 IMPLANT
DERMABOND ADVANCED (GAUZE/BANDAGES/DRESSINGS)
DERMABOND ADVANCED .7 DNX12 (GAUZE/BANDAGES/DRESSINGS) IMPLANT
DRAPE C-ARM 42X72 X-RAY (DRAPES) ×4 IMPLANT
DRAPE INCISE IOBAN 66X45 STRL (DRAPES) IMPLANT
DRAPE POUCH INSTRU U-SHP 10X18 (DRAPES) ×2 IMPLANT
DRAPE SURG 17X23 STRL (DRAPES) ×2 IMPLANT
DRAPE U-SHAPE 47X51 STRL (DRAPES) ×2 IMPLANT
DRSG MEPILEX BORDER 4X8 (GAUZE/BANDAGES/DRESSINGS) ×2 IMPLANT
DURAPREP 26ML APPLICATOR (WOUND CARE) ×2 IMPLANT
ELECT BLADE 4.0 EZ CLEAN MEGAD (MISCELLANEOUS) ×2
ELECT CAUTERY BLADE 6.4 (BLADE) ×2 IMPLANT
ELECT REM PT RETURN 9FT ADLT (ELECTROSURGICAL) ×2
ELECTRODE BLDE 4.0 EZ CLN MEGD (MISCELLANEOUS) ×1 IMPLANT
ELECTRODE REM PT RTRN 9FT ADLT (ELECTROSURGICAL) ×1 IMPLANT
GAUZE SPONGE 4X4 16PLY XRAY LF (GAUZE/BANDAGES/DRESSINGS) ×2 IMPLANT
GLOVE BIOGEL PI IND STRL 6.5 (GLOVE) ×1 IMPLANT
GLOVE BIOGEL PI IND STRL 8.5 (GLOVE) ×2 IMPLANT
GLOVE BIOGEL PI INDICATOR 6.5 (GLOVE) ×1
GLOVE BIOGEL PI INDICATOR 8.5 (GLOVE) ×2
GLOVE ECLIPSE 6.0 STRL STRAW (GLOVE) ×2 IMPLANT
GLOVE ECLIPSE 8.5 STRL (GLOVE) ×4 IMPLANT
GLOVE SS BIOGEL STRL SZ 7.5 (GLOVE) ×1 IMPLANT
GLOVE SUPERSENSE BIOGEL SZ 7.5 (GLOVE) ×1
GOWN PREVENTION PLUS XXLARGE (GOWN DISPOSABLE) ×4 IMPLANT
GOWN STRL NON-REIN LRG LVL3 (GOWN DISPOSABLE) ×6 IMPLANT
GRANULES STERILE 5CC (Bone Implant) ×2 IMPLANT
HEMOSTAT SNOW SURGICEL 2X4 (HEMOSTASIS) IMPLANT
INSERT FOGARTY 61MM (MISCELLANEOUS) IMPLANT
INSERT FOGARTY SM (MISCELLANEOUS) IMPLANT
INTERPLATE 39X14X12 (Plate) ×2 IMPLANT
KIT BASIN OR (CUSTOM PROCEDURE TRAY) ×2 IMPLANT
KIT ROOM TURNOVER OR (KITS) ×4 IMPLANT
LOOP VESSEL MAXI BLUE (MISCELLANEOUS) IMPLANT
LOOP VESSEL MINI RED (MISCELLANEOUS) IMPLANT
NEEDLE ASP BONE MRW 11GX15 (NEEDLE) ×2 IMPLANT
NEEDLE SPNL 18GX3.5 QUINCKE PK (NEEDLE) ×2 IMPLANT
NS IRRIG 1000ML POUR BTL (IV SOLUTION) ×2 IMPLANT
PACK LAMINECTOMY ORTHO (CUSTOM PROCEDURE TRAY) ×2 IMPLANT
PACK UNIVERSAL I (CUSTOM PROCEDURE TRAY) ×2 IMPLANT
PAD ARMBOARD 7.5X6 YLW CONV (MISCELLANEOUS) ×8 IMPLANT
PEEK SPACER INTERPLAT 35X14X12 (Peek) ×2 IMPLANT
PLATE SPINAL INTERPLT 39X14X12 (Plate) ×1 IMPLANT
PUTTY DBX 5CC (Putty) ×2 IMPLANT
SCREW BONE STANDARD (Screw) ×6 IMPLANT
SCREW BONE STD (Screw) ×3 IMPLANT
SCREW SPINAL STD (Orthopedic Implant) ×2 IMPLANT
SPONGE INTESTINAL PEANUT (DISPOSABLE) ×4 IMPLANT
SPONGE LAP 18X18 X RAY DECT (DISPOSABLE) IMPLANT
SPONGE LAP 4X18 X RAY DECT (DISPOSABLE) IMPLANT
SPONGE SURGIFOAM ABS GEL 100 (HEMOSTASIS) ×2 IMPLANT
STAPLER VISISTAT 35W (STAPLE) IMPLANT
STRIP CLOSURE SKIN 1/2X4 (GAUZE/BANDAGES/DRESSINGS) IMPLANT
SURGIFLO TRUKIT (HEMOSTASIS) IMPLANT
SUT MNCRL AB 3-0 PS2 18 (SUTURE) ×2 IMPLANT
SUT MNCRL AB 4-0 PS2 18 (SUTURE) IMPLANT
SUT PDS AB 1 CTX 36 (SUTURE) ×2 IMPLANT
SUT PROLENE 4 0 RB 1 (SUTURE)
SUT PROLENE 4-0 RB1 .5 CRCL 36 (SUTURE) IMPLANT
SUT PROLENE 5 0 C 1 24 (SUTURE) IMPLANT
SUT PROLENE 5 0 CC1 (SUTURE) IMPLANT
SUT PROLENE 6 0 C 1 30 (SUTURE) IMPLANT
SUT PROLENE 6 0 CC (SUTURE) IMPLANT
SUT SILK 0 TIES 10X30 (SUTURE) IMPLANT
SUT SILK 2 0 TIES 10X30 (SUTURE) ×2 IMPLANT
SUT SILK 2 0SH CR/8 30 (SUTURE) IMPLANT
SUT SILK 3 0 TIES 10X30 (SUTURE) ×2 IMPLANT
SUT SILK 3 0SH CR/8 30 (SUTURE) IMPLANT
SUT VIC AB 0 CT1 27 (SUTURE) ×1
SUT VIC AB 0 CT1 27XBRD ANBCTR (SUTURE) ×1 IMPLANT
SUT VIC AB 1 CT1 27 (SUTURE)
SUT VIC AB 1 CT1 27XBRD ANBCTR (SUTURE) IMPLANT
SUT VIC AB 2-0 CT1 18 (SUTURE) ×2 IMPLANT
SUT VIC AB 2-0 CT1 36 (SUTURE) ×2 IMPLANT
SUT VIC AB 3-0 SH 27 (SUTURE)
SUT VIC AB 3-0 SH 27X BRD (SUTURE) IMPLANT
SYR BULB IRRIGATION 50ML (SYRINGE) ×2 IMPLANT
TOWEL OR 17X24 6PK STRL BLUE (TOWEL DISPOSABLE) ×4 IMPLANT
TOWEL OR 17X26 10 PK STRL BLUE (TOWEL DISPOSABLE) ×4 IMPLANT
TRAY FOLEY CATH 14FRSI W/METER (CATHETERS) ×2 IMPLANT
WATER STERILE IRR 1000ML POUR (IV SOLUTION) ×2 IMPLANT

## 2012-05-13 NOTE — Progress Notes (Signed)
UR COMPLETED  

## 2012-05-13 NOTE — Op Note (Signed)
OPERATIVE REPORT  DATE OF SURGERY: 05/13/2012  PATIENT: [name], 66 y.o. male MRN: [id number]  DOB: 07-19-1946  PRE-OPERATIVE DIAGNOSIS: L5-S1 degenerative disc disease  POST-OPERATIVE DIAGNOSIS:  Same  PROCEDURE: Anterior exposure forALIF  SURGEON:  Gretta Began, M.D.  Co-surgeon for the exposure: D Brooks,MD  ANESTHESIA:  Gen.  EBL: 100 ml  Total I/O In: 2850 [I.V.:2850] Out: 340 [Urine:240; Blood:100]  BLOOD ADMINISTERED: None  DRAINS: None  SPECIMEN: None  COUNTS CORRECT:  YES  PLAN OF CARE: PACU stable   PATIENT DISPOSITION:  PACU - hemodynamically stable  PROCEDURE DETAILS: The patient was taken to the operating room and placed supine position where the abdomen was prepped and draped in usual sterile fashion AP and lateral C-arm were obtained to locate the level of the L5-S1 disc. An incision was made from the midline laterally to the left over this area several fingerbreadths below the umbilicus. The incision was continued down to the subcutaneous fat to the level of anterior rectus sheath. The fat was mobilized off the anterior rectus sheath. The anterior rectus sheath was then opened in line with the skin incision with electrocautery. The rectus muscle was mobilized circumferentially. The retroperitoneal space was entered bluntly below the level of the semilunar line and the peritoneal contents were mobilized to the right. The posterior rectus sheath was opened laterally to give better mobilization. Ureter was identified and preserved. Dissection was continued bluntly to the level of the L5-S1 disc. The middle sacral vessels were divided between ligaclips. Blunt dissection was used to give mobilization to the right and left of the L5-S1 disc. The Thompson retractor was then brought onto the field and the reverse lip 150 blades were positioned to the right and left of the L5-S1 disc. A marker needle was placed in the L5-S1 disc and C-arm was used to confirm this was  indeed the location of the L5-S1 disc. The remainder procedure we dictated by Dr. Serina Cowper, M.D. 05/13/2012 12:49 PM

## 2012-05-13 NOTE — Plan of Care (Signed)
Problem: Consults Goal: Diagnosis - Spinal Surgery Thoraco/Lumbar Spine Fusion     

## 2012-05-13 NOTE — Anesthesia Preprocedure Evaluation (Addendum)
Anesthesia Evaluation  Patient identified by MRN, date of birth, ID band Patient awake    Reviewed: Allergy & Precautions, H&P , NPO status , Patient's Chart, lab work & pertinent test results, reviewed documented beta blocker date and time   Airway Mallampati: II TM Distance: >3 FB Neck ROM: full    Dental  (+) Chipped and Dental Advisory Given   Pulmonary Current Smoker,          Cardiovascular hypertension, Pt. on medications     Neuro/Psych Anxiety Depression    GI/Hepatic (+) Hepatitis -, C  Endo/Other    Renal/GU      Musculoskeletal  (+) Arthritis -,   Abdominal   Peds  Hematology   Anesthesia Other Findings Some chips and missing teeth; pt states he needs dentures, denies any loose teeth  Reproductive/Obstetrics                         Anesthesia Physical Anesthesia Plan  ASA: II  Anesthesia Plan: General   Post-op Pain Management:    Induction: Intravenous  Airway Management Planned: Oral ETT  Additional Equipment:   Intra-op Plan:   Post-operative Plan: Extubation in OR  Informed Consent: I have reviewed the patients History and Physical, chart, labs and discussed the procedure including the risks, benefits and alternatives for the proposed anesthesia with the patient or authorized representative who has indicated his/her understanding and acceptance.     Plan Discussed with: CRNA and Surgeon  Anesthesia Plan Comments:         Anesthesia Quick Evaluation

## 2012-05-13 NOTE — Anesthesia Postprocedure Evaluation (Signed)
Anesthesia Post Note  Patient: [name]  Procedure(s) Performed: Procedure(s) (LRB): ANTERIOR LUMBAR FUSION 1 LEVEL (N/A) ABDOMINAL EXPOSURE (N/A)  Anesthesia type: General  Patient location: PACU  Post pain: Pain level controlled and Adequate analgesia  Post assessment: Post-op Vital signs reviewed, Patient's Cardiovascular Status Stable, Respiratory Function Stable, Patent Airway and Pain level controlled  Last Vitals:  Filed Vitals:   05/13/12 1225  BP: 148/79  Pulse: 82  Temp:   Resp: 8    Post vital signs: Reviewed and stable  Level of consciousness: awake, alert  and oriented  Complications: No apparent anesthesia complications

## 2012-05-13 NOTE — Preoperative (Signed)
Beta Blockers   Reason not to administer Beta Blockers:Not Applicable 

## 2012-05-13 NOTE — Transfer of Care (Signed)
Immediate Anesthesia Transfer of Care Note  Patient: [name]  Procedure(s) Performed: Procedure(s) (LRB): ANTERIOR LUMBAR FUSION 1 LEVEL (N/A) ABDOMINAL EXPOSURE (N/A)  Patient Location: PACU  Anesthesia Type: General  Level of Consciousness: awake, alert , oriented and patient cooperative  Airway & Oxygen Therapy: Patient Spontanous Breathing and Patient connected to nasal cannula oxygen  Post-op Assessment: Report given to PACU RN, Post -op Vital signs reviewed and stable and Patient moving all extremities  Post vital signs: Reviewed and stable  Complications: No apparent anesthesia complications

## 2012-05-13 NOTE — H&P (Signed)
No change to clinical exam H+P reviewed 

## 2012-05-13 NOTE — Op Note (Signed)
NAMEASLAN, HIMES NO.:  [id number]  MEDICAL RECORD NO.:  [id number]  LOCATION:  [facility]                         FACILITY:  [facility]  PHYSICIAN:  Alvy Beal, MD    DATE OF BIRTH:  07-17-1946  DATE OF PROCEDURE: DATE OF DISCHARGE:                              OPERATIVE REPORT   PREOPERATIVE DIAGNOSIS:  Discogenic lumbar disease, L5-S1.  POSTOPERATIVE DIAGNOSIS:  Discogenic lumbar disease, L5-S1.  OPERATIVE PROCEDURE:  Anterior lumbar interbody fusion, L5-S1.  SURGEON:  Larina Earthly, M.D.  FIRST ASSISTANT:  Norval Gable, [place].  HISTORY:  This is a very pleasant gentleman who has been having severe debilitating back, buttock, and bilateral hamstring pain for sometime now.  Despite longstanding conservative management consisting of physical therapy, activity modification, injections and medications, he continued to deteriorate.  As a result of the failure of conservative management, he elected to proceed with surgery.  All appropriate risks, benefits, and alternatives were discussed with the patient and consent was obtained.  OPERATIVE NOTE:  The patient was brought to the operating room and placed supine on the operating table.  After successful induction of general anesthesia and endotracheal intubation, TEDs, SCDs, and Foley were inserted.  The patient was placed supine on the table and the abdomen was prepped and draped in a standard fashion.  Time-out was then done to confirm patient, procedure, and all other pertinent important data.  Once this was done, Dr. Arbie Cookey and myself scrubbed in, and I assisted Dr. Arbie Cookey in performing a standard anterior retroperitoneal approach to the lumbar spine.  Please refer to his dictation for specifics on the approach.  Once the [facility] retractor blades were properly positioned, a needle marker was placed into the L5-S1 disk and intraoperative lateral fluoroscopy confirmed that we are at the appropriate level.  Once  we confirmed we were at L5-S1, I then incised the disk space and then using a combination of pituitary rongeurs, curettes, and Kerrison rongeurs, I removed all the disk material.  I then released the annulus from the posterior aspect of the vertebral body of L5 and S1 using a fine nerve hook and a 2-mm Kerrison rongeur.  This allowed me to remove the bulk of the posterior annulus to expose the posterior longitudinal ligament.  At this point, a centralized disk herniation that was seen preoperatively on the MRI was removed.  I can now freely pass my nerve hook behind the vertebral bodies of L5 and S1 without encountering any resistance.  Once I had a complete diskectomy done, I then rasped the endplates until I had bleeding subchondral bone.  Using a rasp trial, I placed 14/12 degree lordotic RSP spacer in and I felt as though this provided the best fit.  I then created a defect in the anterosuperior aspect of the S1 vertebral body in order to harvest 5 mL of intramural blood.  Once this was done, it was mixed with chronOS.  The size 14/12 degree lordotic large PEEK cage was then packed with combination of chronOS and DBX.  It was then [facility] into position.  Once I had an excellent position, I then placed 0 profile plate and malleted into  position.  I then secured the plate with bone screws, 2 into L5, 1 into S1.  All screws had excellent purchase.  I placed a locking plate to prevent the screw from backing up.  At this point, I irrigated the wound copiously with normal saline and then sequentially removed the Thompson blades retractors right side and left-side, superior and inferior.  At no point was there any hemorrhage.  Once I confirmed hemostasis, I irrigated again and then closed the rectus sheath with running #1 PDS suture.  I then closed the deep layer with running #1, superficial with 2-0 Vicryl sutures, and a 3- 0 Monocryl for the skin.  Steri-Strips and dry dressing were  applied. Final intraoperative x-rays were also satisfactory and there was no surgical instrumentation left in the wound except for the hardware that was implanted.  The patient was then extubated and transferred to PACU without incident.     Alvy Beal, MD     DDB/MEDQ  D:  05/13/2012  T:  05/13/2012  Job:  [id number]  cc:   Larina Earthly, M.D.

## 2012-05-13 NOTE — Brief Op Note (Signed)
05/13/2012  10:57 AM  PATIENT:  [name]  66 y.o. male  PRE-OPERATIVE DIAGNOSIS:  discogenic backpain with Radiculopathy  POST-OPERATIVE DIAGNOSIS:  discogenic back pain with radiculopathy  PROCEDURE:  Procedure(s) (LRB): ANTERIOR LUMBAR FUSION 1 LEVEL (N/A) ABDOMINAL EXPOSURE (N/A)  SURGEON:  Surgeon(s) and Role: Panel 1:    * Venita Lick, MD - Primary  Panel 2:    * Larina Earthly, MD - Primary  PHYSICIAN ASSISTANT:   ASSISTANTS: Dr Gretta Began - approach surgeon   Norval Gable - PA assist   ANESTHESIA:   general  EBL:  Total I/O In: 2050 [I.V.:2050] Out: 340 [Urine:240; Blood:100]  BLOOD ADMINISTERED:none  DRAINS: none   LOCAL MEDICATIONS USED:  MARCAINE    and NONE  SPECIMEN:  No Specimen  DISPOSITION OF SPECIMEN:  N/A  COUNTS:  YES  TOURNIQUET:  * No tourniquets in log *  DICTATION: .Other Dictation: Dictation Number [id number]  PLAN OF CARE: Admit to inpatient   PATIENT DISPOSITION:  PACU - hemodynamically stable.

## 2012-05-14 ENCOUNTER — Inpatient Hospital Stay (HOSPITAL_COMMUNITY): Payer: Medicare Other

## 2012-05-14 ENCOUNTER — Encounter (HOSPITAL_COMMUNITY): Payer: Self-pay | Admitting: General Practice

## 2012-05-14 LAB — BASIC METABOLIC PANEL
BUN: 13 mg/dL (ref 6–23)
CO2: 28 mEq/L (ref 19–32)
Chloride: 100 mEq/L (ref 96–112)
Creatinine, Ser: 0.72 mg/dL (ref 0.50–1.35)
Glucose, Bld: 134 mg/dL — ABNORMAL HIGH (ref 70–99)

## 2012-05-14 LAB — CBC
HCT: 40.4 % (ref 39.0–52.0)
MCV: 92 fL (ref 78.0–100.0)
RBC: 4.39 MIL/uL (ref 4.22–5.81)
WBC: 18.7 10*3/uL — ABNORMAL HIGH (ref 4.0–10.5)

## 2012-05-14 MED ORDER — POLYETHYLENE GLYCOL 3350 17 G PO PACK: 17.0000 g | PACK | Freq: Every day | ORAL | Status: AC

## 2012-05-14 MED ORDER — ONDANSETRON HCL 4 MG PO TABS: 4.0000 mg | ORAL_TABLET | Freq: Three times a day (TID) | ORAL | Status: AC | PRN

## 2012-05-14 MED ORDER — OXYCODONE-ACETAMINOPHEN 10-325 MG PO TABS: 1.0000 | ORAL_TABLET | Freq: Four times a day (QID) | ORAL | Status: AC | PRN

## 2012-05-14 MED ORDER — METHOCARBAMOL 500 MG PO TABS: 500.0000 mg | ORAL_TABLET | Freq: Three times a day (TID) | ORAL | Status: AC

## 2012-05-14 MED ORDER — ENOXAPARIN SODIUM 40 MG/0.4ML ~~LOC~~ SOLN: 40.0000 mg | SUBCUTANEOUS | Status: DC

## 2012-05-14 MED FILL — Heparin Sodium (Porcine) Inj 1000 Unit/ML: INTRAMUSCULAR | Qty: 30 | Status: AC

## 2012-05-14 MED FILL — Sodium Chloride IV Soln 0.9%: INTRAVENOUS | Qty: 1000 | Status: AC

## 2012-05-14 NOTE — Care Management Note (Signed)
    Page 1 of 2   05/14/2012     11:33:57 AM   CARE MANAGEMENT NOTE 05/14/2012  Patient:  [name], [name]   Account Number:  [id number]  Date Initiated:  05/14/2012  Documentation initiated by:  Anette Guarneri  Subjective/Objective Assessment:   POD#1 s/p ALIF  plans to d/c home with wife  PT eval. pending     Action/Plan:   home with [facility] services if needed   Anticipated DC Date:  05/14/2012   Anticipated DC Plan:  HOME W HOME HEALTH SERVICES      DC Planning Services  CM consult      Choice offered to / List presented to:  C-1 Patient        HH arranged  HH-2 PT      [facility] agency  [facility]   Status of service:  Completed, signed off Medicare Important Message given?   (If response is "NO", the following Medicare IM given date fields will be blank) Date Medicare IM given:   Date Additional Medicare IM given:    Discharge Disposition:  HOME W HOME HEALTH SERVICES  Per UR Regulation:  Reviewed for med. necessity/level of care/duration of stay  If discussed at Long Length of Stay Meetings, dates discussed:    Comments:  05/14/12 11:30  Anette Guarneri RN/CM HHPT ordered, contacted [place] for HHPT.    05/14/12  10:42  Anette Guarneri RN/CM Spoke with patient and wife regarding d/c plans. Plan for patient to return home, wife will assist, patient choice for [facility] services if needed is [place] OT eval-no f/u OT recommended. PT eval pending, this CM will f/u for PT recommendations.

## 2012-05-14 NOTE — Discharge Summary (Signed)
Patient ID: [name] MRN: [id number] DOB/AGE: 1945/11/30 66 y.o.  Admit date: 05/13/2012 Discharge date: 05/14/2012   Admission Diagnoses:  Principal Problem:  *Discogenic low back pain   Discharge Diagnoses:  Principal Problem:  *Discogenic low back pain  status post Procedure(s): ANTERIOR LUMBAR FUSION 1 LEVEL ABDOMINAL EXPOSURE  Past Medical History  Diagnosis Date  . Hypertension   . Anxiety   . Depression     hx of depression  . Kidney stone     hx of  . Arthritis   . Hepatitis     hx of hepatitis C  . Bell's palsy     hx of, right side    Surgeries: Procedure(s): ANTERIOR LUMBAR FUSION 1 LEVEL ABDOMINAL EXPOSURE on 05/13/2012   Consultants: Dr. Gretta Began; Vascular surgery  Discharged Condition: Improved  Hospital Course: [name] is an 66 y.o. male who was admitted 05/13/2012 for operative treatment of Discogenic low back pain. Patient failed conservative treatments (please see the history and physical for the specifics) and had severe unremitting pain that affects sleep, daily activities and work/hobbies. After pre-op clearance, the patient was taken to the operating room on 05/13/2012 and underwent  Procedure(s): ANTERIOR LUMBAR FUSION 1 LEVEL ABDOMINAL EXPOSURE.    Patient was given perioperative antibiotics: Anti-infectives     Start     Dose/Rate Route Frequency Ordered Stop   05/13/12 1700   ceFAZolin (ANCEF) IVPB 1 g/50 mL premix        1 g 100 mL/hr over 30 Minutes Intravenous Every 8 hours 05/13/12 1412 05/14/12 0147   05/12/12 1420   ceFAZolin (ANCEF) IVPB 2 g/50 mL premix        2 g 100 mL/hr over 30 Minutes Intravenous 60 min pre-op 05/12/12 1420 05/13/12 0900           Patient was given sequential compression devices and early ambulation to prevent DVT.   Patient benefited maximally from hospital stay and there were no complications. At the time of discharge, the patient was urinating/moving their bowels without  difficulty, tolerating a regular diet, pain is controlled with oral pain medications and they have been cleared by PT/OT.   Recent vital signs: Patient Vitals for the past 24 hrs:  BP Temp Pulse Resp SpO2  05/14/12 0612 115/62 mmHg 98 F (36.7 C) 82  18  97 %  05/14/12 0200 114/59 mmHg 98.1 F (36.7 C) 82  18  96 %  05/13/12 2301 119/86 mmHg 98.4 F (36.9 C) 91  11  97 %  05/13/12 1600 - - - 14  97 %  05/13/12 1405 131/81 mmHg 98.1 F (36.7 C) 92  18  97 %  05/13/12 1340 131/72 mmHg 98.9 F (37.2 C) 84  10  99 %     Recent laboratory studies:  [facility] 05/14/12 0727  WBC 18.7*  HGB 13.9  HCT 40.4  PLT 180  NA 139  K 4.3  CL 100  CO2 28  BUN 13  CREATININE 0.72  GLUCOSE 134*  INR --  CALCIUM 9.6     Discharge Medications:   Medication List  As of 05/14/2012  1:37 PM   STOP taking these medications         traMADol 50 MG tablet         TAKE these medications         B-12 PO   Take 1 tablet by mouth daily.      enoxaparin 40  MG/0.4ML injection   Commonly known as: LOVENOX   Inject 0.4 mLs (40 mg total) into the skin daily. For 10 (ten) days      methocarbamol 500 MG tablet   Commonly known as: ROBAXIN   Take 1 tablet (500 mg total) by mouth 3 (three) times daily. MAX 3 pills daily      ondansetron 4 MG tablet   Commonly known as: ZOFRAN   Take 1 tablet (4 mg total) by mouth every 8 (eight) hours as needed for nausea. MAX 3 pills daily      oxyCODONE-acetaminophen 10-325 MG per tablet   Commonly known as: PERCOCET   Take 1 tablet by mouth every 6 (six) hours as needed for pain. MAX 4 pills daily      polyethylene glycol packet   Commonly known as: MIRALAX / GLYCOLAX   Take 17 g by mouth daily. Take 1 packet daily until bowels become regular      spironolactone 25 MG tablet   Commonly known as: ALDACTONE   Take 25 mg by mouth 2 (two) times daily.      traZODone 100 MG tablet   Commonly known as: DESYREL   Take 100 mg by mouth at bedtime.       vitamin E 400 UNIT capsule   Take 800 Units by mouth daily.            Diagnostic Studies:  Chest 2 View  05/07/2012  *RADIOLOGY REPORT*  Clinical Data: Preop.  CHEST - 2 VIEW  Comparison: None.  Findings: Trachea is midline.  Heart size normal.  Probable linear scarring at the left lung base.  Lungs are otherwise clear.  No pleural fluid.  IMPRESSION: No acute findings.  Original Report Authenticated By: Reyes Ivan, M.D.   Dg Lumbar Spine 2-3 Views  05/14/2012  *RADIOLOGY REPORT*  Clinical Data: Status post lumbar spinal fusion.  LUMBAR SPINE - 2-3 VIEW  Comparison: Intraoperative radiographs 05/13/2012.  Findings: There is an interbody graft in place at the L5-S1 level, with fixation screws extending into the L5 and S1 vertebral bodies. Alignment is anatomic.  No acute displaced fractures or compression type fractures are noted.  Mild multilevel degenerative disc disease.  IMPRESSION: Status post anterior lumbar fusion and L5-S1, as above, without acute complicating features.  Original Report Authenticated By: Florencia Reasons, M.D.   Dg Lumbar Spine 2-3 Views  05/13/2012  *RADIOLOGY REPORT*  Clinical Data: Low back pain.  DG C-ARM 1-60 MIN,LUMBAR SPINE - 2-3 VIEW  Comparison: No cross-sectional priors  Findings: C-arm films document L5-S1 ALIF.  No gross adverse features.  IMPRESSION: As above.  Original Report Authenticated By: Elsie Stain, M.D.   Dg Lumbar Spine 2-3 Views  05/07/2012  *RADIOLOGY REPORT*  Clinical Data: Low back pain.  Lumbar disc herniation.  LUMBAR SPINE - 2-3 VIEW  Comparison: None.  Findings: No evidence of acute fracture, spondylolysis, or spondylolisthesis.  Mild vertebral endplate osteophyte formation is noted at levels of L3-4, L5, and L5-S1, without significant space narrowing.  No other significant bone abnormality identified.  IMPRESSION: No acute findings.  Mild lower lumbar vertebral osteophytosis.  Original Report Authenticated By: Danae Orleans, M.D.     Dg C-arm 1-60 Min  05/13/2012  *RADIOLOGY REPORT*  Clinical Data: Low back pain.  DG C-ARM 1-60 MIN,LUMBAR SPINE - 2-3 VIEW  Comparison: No cross-sectional priors  Findings: C-arm films document L5-S1 ALIF.  No gross adverse features.  IMPRESSION: As above.  Original Report  Authenticated By: Elsie Stain, M.D.   Dg Or Local Abdomen  05/13/2012  *RADIOLOGY REPORT*  Clinical Data: Anterior lumbar fusion L5-S1.  Evaluate for retained instruments.  OR LOCAL ABDOMEN  Comparison: AP lumbar spine films 05/07/2012.  Findings: Anterior lumbar interbody fusion device overlies the L5- S1 level.  Small surgical clips are seen in the pelvis.  No surgical instrument is identified.  IMPRESSION: As above.  Critical Value/emergent results were called by telephone at the time of interpretation on 05/13/2012 at 11:10 a.m. to OR nurse, who verbally acknowledged these results.  Original Report Authenticated By: Elsie Stain, M.D.    Discharge Orders    Future Orders Please Complete By Expires   Diet - low sodium heart healthy      Call MD / Call 911      Comments:   If you experience chest pain or shortness of breath, CALL 911 and be transported to the hospital emergency room.  If you develope a fever above 101 F, pus (white drainage) or increased drainage or redness at the wound, or calf pain, call your surgeon's office.   Constipation Prevention      Comments:   Drink plenty of fluids.  Prune juice may be helpful.  You may use a stool softener, such as Colace (over the counter) 100 mg twice a day.  Use MiraLax (over the counter) for constipation as needed.   Increase activity slowly as tolerated      Discharge instructions      Comments:   Keep incision clean and dry.  Leave steri strips in place.  May shower 5 days from surgery; pat to dry following shower.  May redress with clean, dry dressing if you would like.  Do not apply any lotion/cream/ointment to the incision.   Driving restrictions       Comments:   No driving for 2 weeks.  Dr Shon Baton will discuss addition driving restrictions at your first post-op visit in 2 weeks.   Lifting restrictions      Comments:   No lifting anything greater than 5 pounds.  DO NOT reach overhead (above shoulder height).  NO bending, stooping or squatting.  Dr. Shon Baton will discuss additional lifting restrictions at your first post-op visit in 2 weeks.   Face-to-face encounter (required for Medicare/Medicaid patients)      Comments:   I Gwinda Maine certify that this patient is under my care and that I, or a nurse practitioner or physician's assistant working with me, had a face-to-face encounter that meets the physician face-to-face encounter requirements with this patient on 05/14/2012.   Questions: Responses:   The encounter with the patient was in whole, or in part, for the following medical condition, which is the primary reason for home health care anterior lumbar interbody fusion   I certify that, based on my findings, the following services are medically necessary home health services Physical therapy   My clinical findings support the need for the above services OTHER SEE COMMENTS   Further, I certify that my clinical findings support that this patient is homebound (i.e. absences from home require considerable and taxing effort and are for medical reasons or religious services or infrequently or of short duration when for other reasons) Ambulates short distances less than 300 feet   To provide the following care/treatments PT    OT      Follow-up Information    Follow up with Alvy Beal, MD in 2 weeks.   Contact  information:   [facility] [address] [place] [address] [phone]          Discharge Plan:  discharge to Home with home health  Disposition: stable at the time of discharge     Signed: Gwinda Maine for Dr. Venita Lick [facility] [phone] 05/14/2012, 1:37 PM

## 2012-05-14 NOTE — Progress Notes (Signed)
Referral received for SNF. Chart reviewed and CSW has spoken with RNCM who indicates that patient is for DC to home with Home Health and DME.  CSW to sign off. Please re-consult if CSW needs arise.  Tyechia Allmendinger T. Dejuana Weist, LCSWA  [phone]  

## 2012-05-14 NOTE — Progress Notes (Signed)
Patient ID: [name], male   DOB: 1946/08/28, 66 y.o.   MRN: [id number] The patient is comfortable sitting up on the edge of the bed and eating breakfast. He has had no nausea. Port mild to moderate left lower quadrant abdominal soreness. His feet are well-perfused no significant swelling.  Stable status post anterior exposure for L5-S1 disc surgery. Plan per Dr. Shon Baton. Will not follow actively. Please call if we can assist.

## 2012-05-14 NOTE — Evaluation (Addendum)
Physical Therapy Evaluation and Discharge.  Patient Details Name: [name] MRN: [id number] DOB: 1946-02-11 Today's Date: 05/14/2012 Time: 3474-2595 PT Time Calculation (min): 11 min  PT Assessment / Plan / Recommendation Clinical Impression  Pt is a 66 y/o male who is s/p single level lumbar fusion. Educated pt and spouse on back precautions. Pt presents with no futher acute PT needs.      PT Assessment  All further PT needs can be met in the next venue of care    Follow Up Recommendations  Home health PT    Barriers to Discharge        Equipment Recommendations  None recommended by PT    Recommendations for Other Services     Frequency      Precautions / Restrictions Precautions Precautions: Back Precaution Booklet Issued: Yes (comment) Required Braces or Orthoses: Spinal Brace Spinal Brace: Applied in sitting position;Lumbar corset   Pertinent Vitals/Pain 2/10 low back at site of surgery. No intervention required per pt.       Mobility  Bed Mobility Bed Mobility: Rolling Left;Left Sidelying to Sit;Sit to Sidelying Left Rolling Left: 7: Independent Left Sidelying to Sit: 7: Independent Sit to Sidelying Left: 7: Independent Details for Bed Mobility Assistance: Pt able to explain and demonstrate proper technique to adhere to back precautions.  Transfers Transfers: Sit to Stand;Stand to Sit Sit to Stand: 7: Independent;From bed;From chair/3-in-1;With upper extremity assist Stand to Sit: 7: Independent;With upper extremity assist;To bed;To chair/3-in-1;To elevated surface Details for Transfer Assistance: Demonstrates safety and independence with transfers.  Ambulation/Gait Ambulation/Gait Assistance: 5: Supervision Ambulation Distance (Feet): 150 Feet Assistive device: None Ambulation/Gait Assistance Details: Pt a little unsteady initially but improved with practice.  Gait Pattern: Within Functional Limits Gait velocity: Decreased Stairs: Yes Stairs Assistance:  5: Supervision Stairs Assistance Details (indicate cue type and reason): supervision for safety Stair Management Technique: One rail Right Number of Stairs: 2  Wheelchair Mobility Wheelchair Mobility: No    Exercises     PT Diagnosis: Abnormality of gait  PT Problem List: Decreased mobility PT Treatment Interventions:     PT Goals    Visit Information  Last PT Received On: 05/14/12 Assistance Needed: +1    Subjective Data      Prior Functioning  Home Living Lives With: Spouse Available Help at Discharge: Family;Available 24 hours/day Type of Home: House Home Access: Stairs to enter [employer] of Steps: 4 Entrance Stairs-Rails: Right;Left;Can reach both Home Layout: One level Bathroom Shower/Tub: Engineer, manufacturing systems: Handicapped height Home Adaptive Equipment: None Prior Function Level of Independence: Independent Able to Take Stairs?: Yes Driving: Yes Communication Communication: No difficulties    Cognition  Overall Cognitive Status: Appears within functional limits for tasks assessed/performed Arousal/Alertness: Awake/alert Orientation Level: Appears intact for tasks assessed Behavior During Session: [facility] for tasks performed    Extremity/Trunk Assessment Right Upper Extremity Assessment RUE ROM/Strength/Tone: [facility] for tasks assessed Left Upper Extremity Assessment LUE ROM/Strength/Tone: WFL for tasks assessed Right Lower Extremity Assessment RLE ROM/Strength/Tone: WFL for tasks assessed RLE Sensation: WFL - Light Touch;WFL - Proprioception RLE Coordination: WFL - gross motor Left Lower Extremity Assessment LLE ROM/Strength/Tone: WFL for tasks assessed LLE Sensation: WFL - Proprioception;WFL - Light Touch LLE Coordination: WFL - gross motor Trunk Assessment Trunk Assessment: Normal   Balance Balance Balance Assessed: No  End of Session PT - End of Session Equipment Utilized During Treatment: Gait belt;Back brace Activity  Tolerance: Patient tolerated treatment well Patient left: in chair;with call bell/phone  within reach;with family/visitor present Nurse Communication: Mobility status  GP     Wong Steadham 05/14/2012, 2:03 PM  Grove Defina L. Estefan Pattison DPT [phone]

## 2012-05-14 NOTE — Evaluation (Addendum)
Occupational Therapy One Time Evaluation Patient Details Name: [name] MRN: [id number] DOB: Sep 30, 1946 Today's Date: 05/14/2012 Time: 4098-1191 OT Time Calculation (min): 21 min  OT Assessment / Plan / Recommendation Clinical Impression  Pt is s/p ANTERIOR LUMBAR FUSION 1 LEVEL and is overall supervision level with ADL and wife/family able to provide this. No DME needs or follow up needs.    OT Assessment  Patient does not need any further OT services    Follow Up Recommendations  No OT follow up    Barriers to Discharge      Equipment Recommendations  None recommended by OT    Recommendations for Other Services    Frequency       Precautions / Restrictions Precautions Precautions: Back Precaution Booklet Issued: Yes (comment) Required Braces or Orthoses: Spinal Brace Spinal Brace: Applied in sitting position;Lumbar corset Restrictions Weight Bearing Restrictions: No        ADL  Eating/Feeding: Simulated;Independent Where Assessed - Eating/Feeding: Chair Grooming: Performed;Supervision/safety Where Assessed - Grooming: Unsupported standing Upper Body Bathing: Simulated;Chest;Right arm;Left arm;Abdomen;Supervision/safety Where Assessed - Upper Body Bathing: Unsupported standing Lower Body Bathing: Simulated;Minimal assistance;Other (comment) (without adaptive equipment) Where Assessed - Lower Body Bathing: Supported sit to stand Upper Body Dressing: Simulated;Supervision/safety;Other (comment) (assist with brace in sitting) Where Assessed - Upper Body Dressing: Unsupported sitting Lower Body Dressing: Simulated;Moderate assistance Where Assessed - Lower Body Dressing: Supported sit to Pharmacist, hospital: Performed;Supervision/safety;Other (comment) (min verbal cues for back precautions) Toilet Transfer Method: Other (comment) (ambulating. no device.) Toilet Transfer Equipment: Comfort height toilet Toileting - Clothing Manipulation and Hygiene:  Simulated;Supervision/safety Where Assessed - Engineer, mining and Hygiene: Sit to stand from 3-in-1 or toilet Tub/Shower Transfer: Simulated;Supervision/safety Tub/Shower Transfer Method: Other (comment) (simulate side step over tub) Equipment Used: Back brace ADL Comments: Issued handout on back precautions and reviewed all with pt/wife. Pt doing well and wants to discharge home soon. Pt needed intermittant cues for back precautions. Wife to help with LB ADL.    OT Diagnosis:    OT Problem List:   OT Treatment Interventions:     OT Goals    Visit Information  Last OT Received On: 05/14/12 Assistance Needed: +1    Subjective Data  Subjective: I want to go home Patient Stated Goal: home   Prior Functioning  Vision/Perception  Home Living Lives With: Spouse Available Help at Discharge: Family;Available 24 hours/day Type of Home: House Home Access: Stairs to enter [employer] of Steps: 4 Entrance Stairs-Rails: Right;Left;Can reach both Home Layout: One level Bathroom Shower/Tub: Engineer, manufacturing systems: Handicapped height Home Adaptive Equipment: None Prior Function Level of Independence: Independent Able to Take Stairs?: Yes Communication Communication: No difficulties      Cognition  Overall Cognitive Status: Appears within functional limits for tasks assessed/performed Arousal/Alertness: Awake/alert Orientation Level: Appears intact for tasks assessed Behavior During Session: [facility] for tasks performed    Extremity/Trunk Assessment Right Upper Extremity Assessment RUE ROM/Strength/Tone: [facility] for tasks assessed Left Upper Extremity Assessment LUE ROM/Strength/Tone: WFL for tasks assessed   Mobility Bed Mobility Bed Mobility: Rolling Left Rolling Left: 5: Supervision Left Sidelying to Sit: 5: Supervision;HOB flat Details for Bed Mobility Assistance: verbal cues for log roll technique. Transfers Transfers: Sit to Stand;Stand to  Sit Sit to Stand: 5: Supervision;With upper extremity assist;From bed Stand to Sit: 5: Supervision;With upper extremity assist;To chair/3-in-1 Details for Transfer Assistance: Did not use UEs to stand from toilet. min verbal cues for back precautions to adhere   Exercise  Balance    End of Session OT - End of Session Equipment Utilized During Treatment: Back brace Activity Tolerance: Patient tolerated treatment well Patient left: in chair;with call bell/phone within reach;with family/visitor present  GO     Lennox Laity [phone] 05/14/2012, 10:18 AM

## 2012-05-14 NOTE — Progress Notes (Addendum)
    Subjective: Procedure(s) (LRB): ANTERIOR LUMBAR FUSION 1 LEVEL (N/A) ABDOMINAL EXPOSURE (N/A) 1 Day Post-Op  Patient reports pain as 3 on 0-10 scale.  Reports no leg pain and incisional abdominal pain   Positive void Negative bowel movement Positive flatus Negative chest pain or shortness of breath  Objective: Vital signs in last 24 hours: Temp:  [97.5 F (36.4 C)-98.9 F (37.2 C)] 98 F (36.7 C) (07/25 0612) Pulse Rate:  [81-92] 82  (07/25 0612) Resp:  [8-22] 18  (07/25 0612) BP: (114-154)/(59-86) 115/62 mmHg (07/25 0612) SpO2:  [95 %-100 %] 97 % (07/25 0612) FiO2 (%):  [95 %] 95 % (07/24 1600)  Intake/Output from previous day: 07/24 0701 - 07/25 0700 In: 3728.3 [I.V.:3728.3] Out: 2740 [Urine:2640; Blood:100]   Basename 05/14/12 0727  WBC 18.7*  RBC 4.39  HCT 40.4  PLT 180    Basename 05/14/12 0727  NA 139  K 4.3  CL 100  CO2 28  BUN 13  CREATININE 0.72  GLUCOSE 134*  CALCIUM 9.6   No results found for this basename: LABPT:2,INR:2 in the last 72 hours  ABD soft Sensation intact distally Intact pulses distally Dorsiflexion/Plantar flexion intact Incision: dressing C/D/I Compartment soft  Assessment/Plan: Patient doing exceptionally well so far; reports minimal pain and discomfort  xrays per Dr. Shon Baton Mobilization with physical therapy Continue care DC per Dr. Shon Baton and once patient has met therapy goals    [name] 05/14/2012, 8:25 AM  xrays satisfactory Doing well overall Possible d/c today if cleared by PT

## 2012-07-28 ENCOUNTER — Encounter (HOSPITAL_COMMUNITY): Payer: Self-pay

## 2012-07-28 ENCOUNTER — Emergency Department (HOSPITAL_COMMUNITY)
Admission: EM | Admit: 2012-07-28 | Discharge: 2012-07-29 | Disposition: A | Discharge status: Home / Self Care | Payer: Medicare Other | Attending: Emergency Medicine | Admitting: Emergency Medicine

## 2012-07-28 DIAGNOSIS — B1920 Unspecified viral hepatitis C without hepatic coma: Secondary | ICD-10-CM | POA: Insufficient documentation

## 2012-07-28 DIAGNOSIS — F329 Major depressive disorder, single episode, unspecified: Secondary | ICD-10-CM | POA: Insufficient documentation

## 2012-07-28 DIAGNOSIS — F3289 Other specified depressive episodes: Secondary | ICD-10-CM | POA: Insufficient documentation

## 2012-07-28 DIAGNOSIS — M129 Arthropathy, unspecified: Secondary | ICD-10-CM | POA: Insufficient documentation

## 2012-07-28 DIAGNOSIS — F1110 Opioid abuse, uncomplicated: Secondary | ICD-10-CM | POA: Insufficient documentation

## 2012-07-28 DIAGNOSIS — I10 Essential (primary) hypertension: Secondary | ICD-10-CM | POA: Insufficient documentation

## 2012-07-28 DIAGNOSIS — M545 Low back pain, unspecified: Secondary | ICD-10-CM | POA: Insufficient documentation

## 2012-07-28 DIAGNOSIS — Z79899 Other long term (current) drug therapy: Secondary | ICD-10-CM | POA: Insufficient documentation

## 2012-07-28 DIAGNOSIS — F411 Generalized anxiety disorder: Secondary | ICD-10-CM | POA: Insufficient documentation

## 2012-07-28 HISTORY — DX: Other psychoactive substance abuse, uncomplicated: F19.10

## 2012-07-28 LAB — COMPREHENSIVE METABOLIC PANEL WITH GFR
ALT: 29 U/L (ref 0–53)
Albumin: 3.7 g/dL (ref 3.5–5.2)
Alkaline Phosphatase: 79 U/L (ref 39–117)
BUN: 9 mg/dL (ref 6–23)
Calcium: 9 mg/dL (ref 8.4–10.5)
GFR calc Af Amer: >90 mL/min (ref >90)
Glucose, Bld: 88 mg/dL (ref 70–99)
Potassium: 4.1 meq/L (ref 3.5–5.1)
Sodium: 136 meq/L (ref 135–145)
Total Protein: 7.2 g/dL (ref 6.0–8.3)

## 2012-07-28 LAB — CBC
HCT: 41.8 % (ref 39.0–52.0)
Hemoglobin: 13.8 g/dL (ref 13.0–17.0)
MCH: 29.8 pg (ref 26.0–34.0)
MCHC: 33 g/dL (ref 30.0–36.0)
MCV: 90.3 fL (ref 78.0–100.0)
Platelets: 187 10*3/uL (ref 150–400)
RBC: 4.63 MIL/uL (ref 4.22–5.81)
RDW: 14.4 % (ref 11.5–15.5)
WBC: 8.6 10*3/uL (ref 4.0–10.5)

## 2012-07-28 LAB — RAPID URINE DRUG SCREEN, HOSP PERFORMED
Amphetamines: NOT DETECTED
Barbiturates: NOT DETECTED
Benzodiazepines: POSITIVE — AB
Cocaine: NOT DETECTED
Opiates: NOT DETECTED
Tetrahydrocannabinol: NOT DETECTED

## 2012-07-28 LAB — COMPREHENSIVE METABOLIC PANEL
AST: 30 U/L (ref 0–37)
CO2: 24 mEq/L (ref 19–32)
Chloride: 99 mEq/L (ref 96–112)
Creatinine, Ser: 0.74 mg/dL (ref 0.50–1.35)
GFR calc non Af Amer: >90 mL/min (ref >90)
Total Bilirubin: 0.3 mg/dL (ref 0.3–1.2)

## 2012-07-28 LAB — ETHANOL: Alcohol, Ethyl (B): <11 mg/dL (ref 0–11)

## 2012-07-28 LAB — SALICYLATE LEVEL: Salicylate Lvl: <2 mg/dL — ABNORMAL LOW (ref 2.8–20.0)

## 2012-07-28 LAB — ACETAMINOPHEN LEVEL: Acetaminophen (Tylenol), Serum: <15 ug/mL (ref 10–30)

## 2012-07-28 MED ORDER — NICOTINE 21 MG/24HR TD PT24
21.0000 mg | MEDICATED_PATCH | Freq: Every day | TRANSDERMAL | Status: DC
  Administered 2012-07-28: 21 mg via TRANSDERMAL
  Filled 2012-07-28: qty 1

## 2012-07-28 MED ORDER — MELOXICAM 7.5 MG PO TABS
7.5000 mg | ORAL_TABLET | Freq: Every day | ORAL | Status: DC
  Administered 2012-07-28 – 2012-07-29 (×2): 7.5 mg via ORAL
  Filled 2012-07-28 (×2): qty 1

## 2012-07-28 MED ORDER — LORAZEPAM 1 MG PO TABS
1.0000 mg | ORAL_TABLET | Freq: Three times a day (TID) | ORAL | Status: DC | PRN
  Administered 2012-07-28: 1 mg via ORAL
  Filled 2012-07-28: qty 1

## 2012-07-28 MED ORDER — ALUM & MAG HYDROXIDE-SIMETH 200-200-20 MG/5ML PO SUSP: 30.0000 mL | ORAL | Status: DC | PRN

## 2012-07-28 MED ORDER — CLONIDINE HCL 0.1 MG PO TABS
0.1000 mg | ORAL_TABLET | Freq: Two times a day (BID) | ORAL | Status: DC
  Administered 2012-07-28 – 2012-07-29 (×2): 0.1 mg via ORAL
  Filled 2012-07-28 (×3): qty 1

## 2012-07-28 MED ORDER — IBUPROFEN 200 MG PO TABS: 600.0000 mg | ORAL_TABLET | Freq: Three times a day (TID) | ORAL | Status: DC | PRN

## 2012-07-28 MED ORDER — ONDANSETRON HCL 4 MG PO TABS: 4.0000 mg | ORAL_TABLET | Freq: Three times a day (TID) | ORAL | Status: DC | PRN

## 2012-07-28 MED ORDER — TRAZODONE HCL 100 MG PO TABS
100.0000 mg | ORAL_TABLET | Freq: Every day | ORAL | Status: DC
  Administered 2012-07-28: 100 mg via ORAL
  Filled 2012-07-28: qty 1

## 2012-07-28 MED ORDER — ACETAMINOPHEN 325 MG PO TABS: 650.0000 mg | ORAL_TABLET | ORAL | Status: DC | PRN

## 2012-07-28 MED ORDER — SPIRONOLACTONE 25 MG PO TABS
25.0000 mg | ORAL_TABLET | Freq: Two times a day (BID) | ORAL | Status: DC
  Administered 2012-07-28 – 2012-07-29 (×2): 25 mg via ORAL
  Filled 2012-07-28 (×5): qty 1

## 2012-07-28 NOTE — ED Notes (Signed)
NOTE for ACT Team:  [name], pt's daughter, requests to be called by ACT team-- daughter, states she wants to give more and "factual" information about her father, states "father sometimes will not tell the actual information".  Daughter can be contacted at (home)[phone] or (cell)[phone].

## 2012-07-28 NOTE — ED Notes (Signed)
Family contact- daughter [name] cell [phone], home [phone]. Please have ACT call

## 2012-07-28 NOTE — ED Provider Notes (Signed)
History     CSN: [id number]  Arrival date & time 07/28/12  1342   None     Chief Complaint  Patient presents with  . Medical Clearance    opiate additction    (Consider location/radiation/quality/duration/timing/severity/associated sxs/prior treatment) HPI Comments: Patient here requesting detox from opiate medications. Patient required opiates after back surgery 3 months ago -- however he states he has had problems in the past as well. Patient states he took percocet 7.5mg  up to 10 times per day. States last intake was 3 days ago. Denies abuse of other substances. Denies other medical complaints. Onset insidious. No treatments PTA. No SI/HI.   The history is provided by the patient and a relative.    Past Medical History  Diagnosis Date  . Hypertension   . Anxiety   . Depression     hx of depression  . Kidney stone     hx of  . Arthritis   . Hepatitis     hx of hepatitis C  . Bell's palsy     hx of, right side  . Substance abuse     heroin    Past Surgical History  Procedure Date  . Cholecystectomy   . Kidney stone surgery   . Lumbar fusion 05/13/2012  . Anterior lumbar fusion 05/13/2012    Procedure: ANTERIOR LUMBAR FUSION 1 LEVEL;  Surgeon: Venita Lick, MD;  Location: [facility] OR;  Service: Orthopedics;  Laterality: N/A;  ALIF L5-S1    No family history on file.  History  Substance Use Topics  . Smoking status: Current Every Day Smoker -- 0.5 packs/day for 45 years    Types: Cigarettes  . Smokeless tobacco: Never Used  . Alcohol Use: Yes      Review of Systems  Constitutional: Negative for fever and unexpected weight change.  HENT: Negative for sore throat and rhinorrhea.   Eyes: Negative for redness.  Respiratory: Negative for cough.   Cardiovascular: Negative for chest pain.  Gastrointestinal: Negative for nausea, vomiting, abdominal pain, diarrhea and constipation.       Neg for fecal incontinence  Genitourinary: Negative for dysuria, hematuria,  flank pain and difficulty urinating.       Negative for urinary incontinence or retention  Musculoskeletal: Positive for back pain. Negative for myalgias.  Skin: Negative for rash.  Neurological: Negative for weakness, numbness and headaches.       Negative for saddle paresthesias     Allergies  Review of patient's allergies indicates no known allergies.  Home Medications   Current Outpatient Rx  Name Route Sig Dispense Refill  . CLONIDINE HCL 0.1 MG PO TABS Oral Take 0.1 mg by mouth 2 (two) times daily.    . MELOXICAM 7.5 MG PO TABS Oral Take 7.5 mg by mouth daily.    . OXYCODONE-ACETAMINOPHEN 7.5-325 MG PO TABS Oral Take 1 tablet by mouth every 6 (six) hours as needed. For pain.    Marland Kitchen SPIRONOLACTONE 25 MG PO TABS Oral Take 25 mg by mouth 2 (two) times daily.    . TRAZODONE HCL 100 MG PO TABS Oral Take 100 mg by mouth at bedtime.      There were no vitals taken for this visit.  Physical Exam  Nursing note and vitals reviewed. Constitutional: He appears well-developed and well-nourished.  HENT:  Head: Normocephalic and atraumatic.  Eyes: Conjunctivae normal are normal.  Neck: Normal range of motion.  Abdominal: Soft. There is no tenderness. There is no CVA tenderness.  Musculoskeletal: Normal  range of motion. He exhibits no tenderness.       Lumbar back: He exhibits tenderness. He exhibits no bony tenderness.       Back:       No step-off noted with palpation of spine.   Neurological: He is alert. He has normal reflexes. No sensory deficit. He exhibits normal muscle tone.       5/5 strength in entire lower extremities bilaterally. No sensation deficit.   Skin: Skin is warm and dry.  Psychiatric: He has a normal mood and affect.    ED Course  Procedures (including critical care time)  Labs Reviewed  SALICYLATE LEVEL - Abnormal; Notable for the following:    Salicylate Lvl <2.0 (*)     All other components within normal limits  URINE RAPID DRUG SCREEN (HOSP PERFORMED) -  Abnormal; Notable for the following:    Benzodiazepines POSITIVE (*)     All other components within normal limits  COMPREHENSIVE METABOLIC PANEL  ETHANOL  ACETAMINOPHEN LEVEL  CBC   No results found.   1. Opiate abuse, continuous     3:08 PM Patient seen and examined. Work-up initiated. Medications ordered.   Vital signs reviewed and are as follows: Filed Vitals:   07/28/12 1606  BP: 161/67  Pulse: 57  Temp: 97.7 F (36.5 C)  Resp: 16   4:28 PM ACT informed, will see.   12:57 AM Handoff to Dr. Silverio Lay.    MDM  Opiate detox.         Renne Crigler, [place] 07/29/12 [phone]

## 2012-07-28 NOTE — ED Notes (Signed)
Pt presents with no acute distress.  Here for detox from opiate addiction- last medication 2 days ago-  Per family- Pt has filled 90 pills on Sunday. The following Saturday pt had consumed 50 pills.  Pt admits to ingestion 5-6 pills daily 5/325 percocet

## 2012-07-28 NOTE — ED Notes (Signed)
RUE:AV40<JW> Expected date:<BR> Expected time:<BR> Means of arrival:<BR> Comments:<BR> Triage conference room

## 2012-07-29 MED ORDER — ZOLPIDEM TARTRATE 5 MG PO TABS: 5.0000 mg | ORAL_TABLET | Freq: Every evening | ORAL | Status: DC | PRN

## 2012-07-29 MED ORDER — CLONIDINE HCL 0.1 MG PO TABS: 0.2000 mg | ORAL_TABLET | Freq: Four times a day (QID) | ORAL | Status: DC | PRN

## 2012-07-29 MED ORDER — ONDANSETRON 8 MG PO TBDP: 8.0000 mg | ORAL_TABLET | Freq: Three times a day (TID) | ORAL | Status: DC | PRN

## 2012-07-29 NOTE — ED Notes (Signed)
Report received-airway intact-no s/s's of distress-will continue to monitor 

## 2012-07-29 NOTE — ED Notes (Signed)
Patient called for ride home.

## 2012-07-29 NOTE — ED Provider Notes (Signed)
Medical screening examination/treatment/procedure(s) were conducted as a shared visit with non-physician practitioner(s) and myself.  I personally evaluated the patient during the encounter  The patient presents today with narcotic abuse.  There is no indication for involuntary commitment for inpatient treatment.  I think the patient is best managed as an outpatient for his opioid abuse.  The patient will be discharged home with a prescription for clonidine, Ambien, and antiemitics.   8:28 AM Spoke with pt and his daughter. Outpatient plan outlined. No HI or SI.   Lyanne Co, MD 07/29/12 [phone]

## 2013-03-22 ENCOUNTER — Other Ambulatory Visit: Payer: Self-pay | Admitting: Internal Medicine

## 2013-03-22 DIAGNOSIS — R1011 Right upper quadrant pain: Secondary | ICD-10-CM

## 2013-03-30 ENCOUNTER — Other Ambulatory Visit: Payer: Medicare Other

## 2013-04-01 ENCOUNTER — Ambulatory Visit
Admission: RE | Admit: 2013-04-01 | Discharge: 2013-04-01 | Disposition: A | Discharge status: Home / Self Care | Payer: Medicare HMO | Source: Ambulatory Visit | Attending: Internal Medicine | Admitting: Internal Medicine

## 2013-04-01 DIAGNOSIS — R1011 Right upper quadrant pain: Secondary | ICD-10-CM

## 2014-01-06 ENCOUNTER — Emergency Department: Payer: Self-pay | Admitting: Internal Medicine

## 2014-08-16 DIAGNOSIS — M5136 Other intervertebral disc degeneration, lumbar region: Secondary | ICD-10-CM | POA: Insufficient documentation

## 2014-08-16 DIAGNOSIS — M5417 Radiculopathy, lumbosacral region: Secondary | ICD-10-CM | POA: Insufficient documentation

## 2015-07-11 ENCOUNTER — Other Ambulatory Visit: Payer: Self-pay | Admitting: Internal Medicine

## 2015-07-11 DIAGNOSIS — R9389 Abnormal findings on diagnostic imaging of other specified body structures: Secondary | ICD-10-CM

## 2015-07-11 DIAGNOSIS — R053 Chronic cough: Secondary | ICD-10-CM

## 2015-07-11 DIAGNOSIS — R05 Cough: Secondary | ICD-10-CM

## 2015-07-14 ENCOUNTER — Ambulatory Visit
Admission: RE | Admit: 2015-07-14 | Discharge: 2015-07-14 | Disposition: A | Discharge status: Home / Self Care | Payer: Medicare PPO | Source: Ambulatory Visit | Attending: Internal Medicine | Admitting: Internal Medicine

## 2015-07-14 DIAGNOSIS — K449 Diaphragmatic hernia without obstruction or gangrene: Secondary | ICD-10-CM | POA: Diagnosis not present

## 2015-07-14 DIAGNOSIS — N62 Hypertrophy of breast: Secondary | ICD-10-CM | POA: Insufficient documentation

## 2015-07-14 DIAGNOSIS — R938 Abnormal findings on diagnostic imaging of other specified body structures: Secondary | ICD-10-CM | POA: Diagnosis present

## 2015-07-14 DIAGNOSIS — R9389 Abnormal findings on diagnostic imaging of other specified body structures: Secondary | ICD-10-CM

## 2015-07-14 DIAGNOSIS — R053 Chronic cough: Secondary | ICD-10-CM

## 2015-07-14 DIAGNOSIS — R05 Cough: Secondary | ICD-10-CM | POA: Insufficient documentation

## 2015-07-14 MED ORDER — IOHEXOL 300 MG/ML  SOLN
75.0000 mL | Freq: Once | INTRAMUSCULAR | Status: AC | PRN
  Administered 2015-07-14: 75 mL via INTRAVENOUS

## 2015-11-16 ENCOUNTER — Encounter (HOSPITAL_COMMUNITY): Payer: Self-pay

## 2016-01-11 ENCOUNTER — Ambulatory Visit: Payer: Medicare PPO | Attending: Pain Medicine | Admitting: Pain Medicine

## 2016-01-11 ENCOUNTER — Encounter: Payer: Self-pay | Admitting: Pain Medicine

## 2016-01-11 VITALS — BP 105/71 | HR 78 | Temp 98.2°F | Resp 18 | Ht 62.0 in | Wt 152.0 lb

## 2016-01-11 DIAGNOSIS — G8929 Other chronic pain: Secondary | ICD-10-CM | POA: Diagnosis not present

## 2016-01-11 DIAGNOSIS — F418 Other specified anxiety disorders: Secondary | ICD-10-CM

## 2016-01-11 DIAGNOSIS — M25559 Pain in unspecified hip: Secondary | ICD-10-CM | POA: Diagnosis not present

## 2016-01-11 DIAGNOSIS — M25551 Pain in right hip: Secondary | ICD-10-CM

## 2016-01-11 DIAGNOSIS — M961 Postlaminectomy syndrome, not elsewhere classified: Secondary | ICD-10-CM | POA: Insufficient documentation

## 2016-01-11 DIAGNOSIS — M539 Dorsopathy, unspecified: Secondary | ICD-10-CM

## 2016-01-11 DIAGNOSIS — Z5181 Encounter for therapeutic drug level monitoring: Secondary | ICD-10-CM

## 2016-01-11 DIAGNOSIS — F32A Depression, unspecified: Secondary | ICD-10-CM | POA: Insufficient documentation

## 2016-01-11 DIAGNOSIS — F329 Major depressive disorder, single episode, unspecified: Secondary | ICD-10-CM | POA: Insufficient documentation

## 2016-01-11 DIAGNOSIS — I10 Essential (primary) hypertension: Secondary | ICD-10-CM | POA: Insufficient documentation

## 2016-01-11 DIAGNOSIS — M1611 Unilateral primary osteoarthritis, right hip: Secondary | ICD-10-CM | POA: Diagnosis not present

## 2016-01-11 DIAGNOSIS — F1190 Opioid use, unspecified, uncomplicated: Secondary | ICD-10-CM | POA: Insufficient documentation

## 2016-01-11 DIAGNOSIS — Z9889 Other specified postprocedural states: Secondary | ICD-10-CM | POA: Diagnosis not present

## 2016-01-11 DIAGNOSIS — M47896 Other spondylosis, lumbar region: Secondary | ICD-10-CM | POA: Insufficient documentation

## 2016-01-11 DIAGNOSIS — M47816 Spondylosis without myelopathy or radiculopathy, lumbar region: Secondary | ICD-10-CM | POA: Diagnosis not present

## 2016-01-11 DIAGNOSIS — M545 Low back pain, unspecified: Secondary | ICD-10-CM

## 2016-01-11 DIAGNOSIS — G4700 Insomnia, unspecified: Secondary | ICD-10-CM | POA: Insufficient documentation

## 2016-01-11 DIAGNOSIS — F17210 Nicotine dependence, cigarettes, uncomplicated: Secondary | ICD-10-CM | POA: Insufficient documentation

## 2016-01-11 DIAGNOSIS — Z981 Arthrodesis status: Secondary | ICD-10-CM | POA: Diagnosis not present

## 2016-01-11 DIAGNOSIS — F5104 Psychophysiologic insomnia: Secondary | ICD-10-CM | POA: Insufficient documentation

## 2016-01-11 DIAGNOSIS — Z87442 Personal history of urinary calculi: Secondary | ICD-10-CM | POA: Insufficient documentation

## 2016-01-11 DIAGNOSIS — M79606 Pain in leg, unspecified: Secondary | ICD-10-CM | POA: Diagnosis present

## 2016-01-11 DIAGNOSIS — Z720 Tobacco use: Secondary | ICD-10-CM | POA: Insufficient documentation

## 2016-01-11 DIAGNOSIS — M549 Dorsalgia, unspecified: Secondary | ICD-10-CM | POA: Diagnosis present

## 2016-01-11 DIAGNOSIS — F411 Generalized anxiety disorder: Secondary | ICD-10-CM

## 2016-01-11 DIAGNOSIS — Z79891 Long term (current) use of opiate analgesic: Secondary | ICD-10-CM | POA: Insufficient documentation

## 2016-01-11 DIAGNOSIS — Z79899 Other long term (current) drug therapy: Secondary | ICD-10-CM

## 2016-01-11 DIAGNOSIS — Z0189 Encounter for other specified special examinations: Secondary | ICD-10-CM

## 2016-01-11 NOTE — Progress Notes (Signed)
Safety precautions to be maintained throughout the outpatient stay will include: orient to surroundings, keep bed in low position, maintain call bell within reach at all times, provide assistance with transfer out of bed and ambulation.  
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
## 2016-01-14 DIAGNOSIS — G8929 Other chronic pain: Secondary | ICD-10-CM | POA: Insufficient documentation

## 2016-01-14 DIAGNOSIS — M7989 Other specified soft tissue disorders: Secondary | ICD-10-CM

## 2016-01-14 DIAGNOSIS — F418 Other specified anxiety disorders: Secondary | ICD-10-CM | POA: Insufficient documentation

## 2016-01-14 DIAGNOSIS — F419 Anxiety disorder, unspecified: Secondary | ICD-10-CM | POA: Insufficient documentation

## 2016-01-14 DIAGNOSIS — F411 Generalized anxiety disorder: Secondary | ICD-10-CM | POA: Insufficient documentation

## 2016-01-14 DIAGNOSIS — M25551 Pain in right hip: Secondary | ICD-10-CM

## 2016-01-14 DIAGNOSIS — M1611 Unilateral primary osteoarthritis, right hip: Secondary | ICD-10-CM | POA: Insufficient documentation

## 2016-01-14 DIAGNOSIS — M47816 Spondylosis without myelopathy or radiculopathy, lumbar region: Secondary | ICD-10-CM | POA: Insufficient documentation

## 2016-01-14 DIAGNOSIS — M79606 Pain in leg, unspecified: Secondary | ICD-10-CM | POA: Insufficient documentation

## 2016-01-14 DIAGNOSIS — F32A Depression, unspecified: Secondary | ICD-10-CM | POA: Insufficient documentation

## 2016-01-14 NOTE — Assessment & Plan Note (Signed)
According to the patient this surgery was done by Dr. Rolena Infante, [place]. According to the patient, before the surgery he was experiencing primarily low back pain with no lower extremity pain. Approximately 2 months after the surgery he started experiencing the slow onset of low back pain with pain now going into his legs.

## 2016-01-14 NOTE — Assessment & Plan Note (Signed)
Notes from Dr. Nelva Bush and Dr. Sharlet Salina hasn't been requested for the purpose of reviewing to see if either one of them suspected lumbar facet syndrome and whether or not they didn't any diagnostic blocks.

## 2016-01-14 NOTE — Assessment & Plan Note (Signed)
X-rays have been ordered to see if pathology can be identified. In any case, we were planning on doing diagnostic hip blocks to determine the extent of the pain coming from those joints.

## 2016-01-14 NOTE — Assessment & Plan Note (Addendum)
The pain pattern of both lower extremities is exactly the same. The pain goes down the posterior aspect of the leg to the area of the calf. The patient denies any pain into his foot. This type pain pattern is likely to be referred.

## 2016-01-18 LAB — COMPLIANCE DRUG ANALYSIS, UR: PDF: 0

## 2016-02-26 ENCOUNTER — Ambulatory Visit: Payer: Medicare PPO | Attending: Pain Medicine | Admitting: Pain Medicine

## 2016-02-26 ENCOUNTER — Encounter: Payer: Self-pay | Admitting: Pain Medicine

## 2016-02-26 VITALS — BP 111/84 | HR 101 | Temp 97.9°F | Resp 16 | Ht 62.0 in | Wt 152.0 lb

## 2016-02-26 DIAGNOSIS — F329 Major depressive disorder, single episode, unspecified: Secondary | ICD-10-CM | POA: Diagnosis not present

## 2016-02-26 DIAGNOSIS — M79605 Pain in left leg: Secondary | ICD-10-CM | POA: Diagnosis not present

## 2016-02-26 DIAGNOSIS — K449 Diaphragmatic hernia without obstruction or gangrene: Secondary | ICD-10-CM | POA: Insufficient documentation

## 2016-02-26 DIAGNOSIS — M79604 Pain in right leg: Secondary | ICD-10-CM | POA: Insufficient documentation

## 2016-02-26 DIAGNOSIS — Z981 Arthrodesis status: Secondary | ICD-10-CM | POA: Diagnosis not present

## 2016-02-26 DIAGNOSIS — M549 Dorsalgia, unspecified: Secondary | ICD-10-CM | POA: Diagnosis present

## 2016-02-26 DIAGNOSIS — R918 Other nonspecific abnormal finding of lung field: Secondary | ICD-10-CM | POA: Diagnosis not present

## 2016-02-26 DIAGNOSIS — F411 Generalized anxiety disorder: Secondary | ICD-10-CM | POA: Insufficient documentation

## 2016-02-26 DIAGNOSIS — M79606 Pain in leg, unspecified: Secondary | ICD-10-CM

## 2016-02-26 DIAGNOSIS — N62 Hypertrophy of breast: Secondary | ICD-10-CM | POA: Insufficient documentation

## 2016-02-26 DIAGNOSIS — E663 Overweight: Secondary | ICD-10-CM | POA: Insufficient documentation

## 2016-02-26 DIAGNOSIS — Z5181 Encounter for therapeutic drug level monitoring: Secondary | ICD-10-CM

## 2016-02-26 DIAGNOSIS — M25551 Pain in right hip: Secondary | ICD-10-CM | POA: Insufficient documentation

## 2016-02-26 DIAGNOSIS — Z6827 Body mass index (BMI) 27.0-27.9, adult: Secondary | ICD-10-CM | POA: Insufficient documentation

## 2016-02-26 DIAGNOSIS — G8929 Other chronic pain: Secondary | ICD-10-CM | POA: Diagnosis not present

## 2016-02-26 DIAGNOSIS — Z9049 Acquired absence of other specified parts of digestive tract: Secondary | ICD-10-CM | POA: Insufficient documentation

## 2016-02-26 DIAGNOSIS — Z79891 Long term (current) use of opiate analgesic: Secondary | ICD-10-CM | POA: Diagnosis not present

## 2016-02-26 DIAGNOSIS — I10 Essential (primary) hypertension: Secondary | ICD-10-CM | POA: Insufficient documentation

## 2016-02-26 DIAGNOSIS — M1288 Other specific arthropathies, not elsewhere classified, other specified site: Secondary | ICD-10-CM | POA: Insufficient documentation

## 2016-02-26 DIAGNOSIS — M961 Postlaminectomy syndrome, not elsewhere classified: Secondary | ICD-10-CM

## 2016-02-26 DIAGNOSIS — M545 Low back pain: Secondary | ICD-10-CM | POA: Insufficient documentation

## 2016-02-26 DIAGNOSIS — F17210 Nicotine dependence, cigarettes, uncomplicated: Secondary | ICD-10-CM | POA: Diagnosis not present

## 2016-02-26 DIAGNOSIS — M5417 Radiculopathy, lumbosacral region: Secondary | ICD-10-CM | POA: Insufficient documentation

## 2016-02-26 DIAGNOSIS — R05 Cough: Secondary | ICD-10-CM | POA: Insufficient documentation

## 2016-02-26 DIAGNOSIS — M539 Dorsopathy, unspecified: Secondary | ICD-10-CM

## 2016-02-26 DIAGNOSIS — G4700 Insomnia, unspecified: Secondary | ICD-10-CM | POA: Diagnosis not present

## 2016-02-26 MED ORDER — TRAMADOL HCL 50 MG PO TABS: 100.0000 mg | ORAL_TABLET | Freq: Four times a day (QID) | ORAL | Status: DC | PRN

## 2016-02-26 NOTE — Patient Instructions (Addendum)
Steps to Quit Smoking  Smoking tobacco can be harmful to your health and can affect almost every organ in your body. Smoking puts you, and those around you, at risk for developing many serious chronic diseases. Quitting smoking is difficult, but it is one of the best things that you can do for your health. It is never too late to quit. WHAT ARE THE BENEFITS OF QUITTING SMOKING? When you quit smoking, you lower your risk of developing serious diseases and conditions, such as:  Lung cancer or lung disease, such as COPD.  Heart disease.  Stroke.  Heart attack.  Infertility.  Osteoporosis and bone fractures. Additionally, symptoms such as coughing, wheezing, and shortness of breath may get better when you quit. You may also find that you get sick less often because your body is stronger at fighting off colds and infections. If you are pregnant, quitting smoking can help to reduce your chances of having a baby of low birth weight. HOW DO I GET READY TO QUIT? When you decide to quit smoking, create a plan to make sure that you are successful. Before you quit:  Pick a date to quit. Set a date within the next two weeks to give you time to prepare.  Write down the reasons why you are quitting. Keep this list in places where you will see it often, such as on your bathroom mirror or in your car or wallet.  Identify the people, places, things, and activities that make you want to smoke (triggers) and avoid them. Make sure to take these actions:  Throw away all cigarettes at home, at work, and in your car.  Throw away smoking accessories, such as ashtrays and lighters.  Clean your car and make sure to empty the ashtray.  Clean your home, including curtains and carpets.  Tell your family, friends, and coworkers that you are quitting. Support from your loved ones can make quitting easier.  Talk with your health care provider about your options for quitting smoking.  Find out what treatment  options are covered by your health insurance. WHAT STRATEGIES CAN I USE TO QUIT SMOKING?  Talk with your healthcare provider about different strategies to quit smoking. Some strategies include:  Quitting smoking altogether instead of gradually lessening how much you smoke over a period of time. Research shows that quitting "cold turkey" is more successful than gradually quitting.  Attending in-person counseling to help you build problem-solving skills. You are more likely to have success in quitting if you attend several counseling sessions. Even short sessions of 10 minutes can be effective.  Finding resources and support systems that can help you to quit smoking and remain smoke-free after you quit. These resources are most helpful when you use them often. They can include:  Online chats with a counselor.  Telephone quitlines.  Printed self-help materials.  Support groups or group counseling.  Text messaging programs.  Mobile phone applications.  Taking medicines to help you quit smoking. (If you are pregnant or breastfeeding, talk with your health care provider first.) Some medicines contain nicotine and some do not. Both types of medicines help with cravings, but the medicines that include nicotine help to relieve withdrawal symptoms. Your health care provider may recommend:  Nicotine patches, gum, or lozenges.  Nicotine inhalers or sprays.  Non-nicotine medicine that is taken by mouth. Talk with your health care provider about combining strategies, such as taking medicines while you are also receiving in-person counseling. Using these two strategies together makes   you more likely to succeed in quitting than if you used either strategy on its own. If you are pregnant or breastfeeding, talk with your health care provider about finding counseling or other support strategies to quit smoking. Do not take medicine to help you quit smoking unless told to do so by your health care  provider. WHAT THINGS CAN I DO TO MAKE IT EASIER TO QUIT? Quitting smoking might feel overwhelming at first, but there is a lot that you can do to make it easier. Take these important actions:  Reach out to your family and friends and ask that they support and encourage you during this time. Call telephone quitlines, reach out to support groups, or work with a counselor for support.  Ask people who smoke to avoid smoking around you.  Avoid places that trigger you to smoke, such as bars, parties, or smoke-break areas at work.  Spend time around people who do not smoke.  Lessen stress in your life, because stress can be a smoking trigger for some people. To lessen stress, try:  Exercising regularly.  Deep-breathing exercises.  Yoga.  Meditating.  Performing a body scan. This involves closing your eyes, scanning your body from head to toe, and noticing which parts of your body are particularly tense. Purposefully relax the muscles in those areas.  Download or purchase mobile phone or tablet apps (applications) that can help you stick to your quit plan by providing reminders, tips, and encouragement. There are many free apps, such as QuitGuide from the [employer] for [employer] and Prevention). You can find other support for quitting smoking (smoking cessation) through smokefree.gov and other websites. HOW WILL I FEEL WHEN I QUIT SMOKING? Within the first 24 hours of quitting smoking, you may start to feel some withdrawal symptoms. These symptoms are usually most noticeable 2-3 days after quitting, but they usually do not last beyond 2-3 weeks. Changes or symptoms that you might experience include:  Mood swings.  Restlessness, anxiety, or irritation.  Difficulty concentrating.  Dizziness.  Strong cravings for sugary foods in addition to nicotine.  Mild weight gain.  Constipation.  Nausea.  Coughing or a sore throat.  Changes in how your medicines work in your  body.  A depressed mood.  Difficulty sleeping (insomnia). After the first 2-3 weeks of quitting, you may start to notice more positive results, such as:  Improved sense of smell and taste.  Decreased coughing and sore throat.  Slower heart rate.  Lower blood pressure.  Clearer skin.  The ability to breathe more easily.  Fewer sick days. Quitting smoking is very challenging for most people. Do not get discouraged if you are not successful the first time. Some people need to make many attempts to quit before they achieve long-term success. Do your best to stick to your quit plan, and talk with your health care provider if you have any questions or concerns.   This information is not intended to replace advice given to you by your health care provider. Make sure you discuss any questions you have with your health care provider.   Document Released: 10/01/2001 Document Revised: 02/21/2015 Document Reviewed: 02/21/2015 Elsevier Interactive Patient Education 2016 [place] Can Quit Smoking If you are ready to quit smoking or are thinking about it, congratulations! You have chosen to help yourself be healthier and live longer! There are lots of different ways to quit smoking. Nicotine gum, nicotine patches, a nicotine inhaler, or nicotine nasal spray can help with  physical craving. Hypnosis, support groups, and medicines help break the habit of smoking. TIPS TO GET OFF AND STAY OFF CIGARETTES  Learn to predict your moods. Do not let a bad situation be your excuse to have a cigarette. Some situations in your life might tempt you to have a cigarette.  Ask friends and co-workers not to smoke around you.  Make your home smoke-free.  Never have "just one" cigarette. It leads to wanting another and another. Remind yourself of your decision to quit.  On a card, make a list of your reasons for not smoking. Read it at least the same number of times a day as you have a cigarette. Tell  yourself everyday, "I do not want to smoke. I choose not to smoke."  Ask someone at home or work to help you with your plan to quit smoking.  Have something planned after you eat or have a cup of coffee. Take a walk or get other exercise to perk you up. This will help to keep you from overeating.  Try a relaxation exercise to calm you down and decrease your stress. Remember, you may be tense and nervous the first two weeks after you quit. This will pass.  Find new activities to keep your hands busy. Play with a pen, coin, or rubber band. Doodle or draw things on paper.  Brush your teeth right after eating. This will help cut down the craving for the taste of tobacco after meals. You can try mouthwash too.  Try gum, breath mints, or diet candy to keep something in your mouth. IF YOU SMOKE AND WANT TO QUIT:  Do not stock up on cigarettes. Never buy a carton. Wait until one pack is finished before you buy another.  Never carry cigarettes with you at work or at home.  Keep cigarettes as far away from you as possible. Leave them with someone else.  Never carry matches or a lighter with you.  Ask yourself, "Do I need this cigarette or is this just a reflex?"  Bet with someone that you can quit. Put cigarette money in a piggy bank every morning. If you smoke, you give up the money. If you do not smoke, by the end of the week, you keep the money.  Keep trying. It takes 21 days to change a habit!  Talk to your doctor about using medicines to help you quit. These include nicotine replacement gum, lozenges, or skin patches.   This information is not intended to replace advice given to you by your health care provider. Make sure you discuss any questions you have with your health care provider.   Document Released: 08/03/2009 Document Revised: 12/30/2011 Document Reviewed: 08/03/2009 Elsevier Interactive Patient Education 2016 [employer] Tramadol tablets What is this medicine? TRAMADOL  (TRA ma dole) is a pain reliever. It is used to treat moderate to severe pain in adults. This medicine may be used for other purposes; ask your health care provider or pharmacist if you have questions. What should I tell my health care provider before I take this medicine? They need to know if you have any of these conditions: -brain tumor -depression -drug abuse or addiction -head injury -if you frequently drink alcohol containing drinks -kidney disease or trouble passing urine -liver disease -lung disease, asthma, or breathing problems -seizures or epilepsy -suicidal thoughts, plans, or attempt; a previous suicide attempt by you or a family member -an unusual or allergic reaction to tramadol, codeine, other medicines, foods, dyes, or  preservatives -pregnant or trying to get pregnant -breast-feeding How should I use this medicine? Take this medicine by mouth with a full glass of water. Follow the directions on the prescription label. If the medicine upsets your stomach, take it with food or milk. Do not take more medicine than you are told to take. Talk to your pediatrician regarding the use of this medicine in children. Special care may be needed. Overdosage: If you think you have taken too much of this medicine contact a poison control center or emergency room at once. NOTE: This medicine is only for you. Do not share this medicine with others. What if I miss a dose? If you miss a dose, take it as soon as you can. If it is almost time for your next dose, take only that dose. Do not take double or extra doses. What may interact with this medicine? Do not take this medicine with any of the following medications: -MAOIs like Carbex, Eldepryl, Marplan, Nardil, and Parnate This medicine may also interact with the following medications: -alcohol or medicines that contain alcohol -antihistamines -benzodiazepines -bupropion -carbamazepine or  oxcarbazepine -clozapine -cyclobenzaprine -digoxin -furazolidone -linezolid -medicines for depression, anxiety, or psychotic disturbances -medicines for migraine headache like almotriptan, eletriptan, frovatriptan, naratriptan, rizatriptan, sumatriptan, zolmitriptan -medicines for pain like pentazocine, buprenorphine, butorphanol, meperidine, nalbuphine, and propoxyphene -medicines for sleep -muscle relaxants -naltrexone -phenobarbital -phenothiazines like perphenazine, thioridazine, chlorpromazine, mesoridazine, fluphenazine, prochlorperazine, promazine, and trifluoperazine -procarbazine -warfarin This list may not describe all possible interactions. Give your health care provider a list of all the medicines, herbs, non-prescription drugs, or dietary supplements you use. Also tell them if you smoke, drink alcohol, or use illegal drugs. Some items may interact with your medicine. What should I watch for while using this medicine? Tell your doctor or health care professional if your pain does not go away, if it gets worse, or if you have new or a different type of pain. You may develop tolerance to the medicine. Tolerance means that you will need a higher dose of the medicine for pain relief. Tolerance is normal and is expected if you take this medicine for a long time. Do not suddenly stop taking your medicine because you may develop a severe reaction. Your body becomes used to the medicine. This does NOT mean you are addicted. Addiction is a behavior related to getting and using a drug for a non-medical reason. If you have pain, you have a medical reason to take pain medicine. Your doctor will tell you how much medicine to take. If your doctor wants you to stop the medicine, the dose will be slowly lowered over time to avoid any side effects. You may get drowsy or dizzy. Do not drive, use machinery, or do anything that needs mental alertness until you know how this medicine affects you. Do not  stand or sit up quickly, especially if you are an older patient. This reduces the risk of dizzy or fainting spells. Alcohol can increase or decrease the effects of this medicine. Avoid alcoholic drinks. You may have constipation. Try to have a bowel movement at least every 2 to 3 days. If you do not have a bowel movement for 3 days, call your doctor or health care professional. Your mouth may get dry. Chewing sugarless gum or sucking hard candy, and drinking plenty of water may help. Contact your doctor if the problem does not go away or is severe. What side effects may I notice from receiving this medicine? Side effects that  you should report to your doctor or health care professional as soon as possible: -allergic reactions like skin rash, itching or hives, swelling of the face, lips, or tongue -breathing difficulties, wheezing -confusion -itching -light headedness or fainting spells -redness, blistering, peeling or loosening of the skin, including inside the mouth -seizures Side effects that usually do not require medical attention (report to your doctor or health care professional if they continue or are bothersome): -constipation -dizziness -drowsiness -headache -nausea, vomiting This list may not describe all possible side effects. Call your doctor for medical advice about side effects. You may report side effects to FDA at 1-800-FDA-1088. Where should I keep my medicine? Keep out of the reach of children. This medicine may cause accidental overdose and death if it taken by other adults, children, or pets. Mix any unused medicine with a substance like cat litter or coffee grounds. Then throw the medicine away in a sealed container like a sealed bag or a coffee can with a lid. Do not use the medicine after the expiration date. Store at room temperature between 15 and 30 degrees C (59 and 86 degrees F). NOTE: This sheet is a summary. It may not cover all possible information. If you have  questions about this medicine, talk to your doctor, pharmacist, or health care provider.    2016, Elsevier/[employer]. (2013-12-03 15:42:09)

## 2016-02-26 NOTE — Progress Notes (Signed)
Safety precautions to be maintained throughout the outpatient stay will include: orient to surroundings, keep bed in low position, maintain call bell within reach at all times, provide assistance with transfer out of bed and ambulation. Pill count- this is patient's second visit after initial evaluation.

## 2016-02-26 NOTE — Progress Notes (Signed)
Patient's Name: [name]  Patient type: Established  MRN: [id number]  Service setting: Ambulatory outpatient  DOB: February 13, 1946  Location: [facility]  DOS: 02/26/2016  Primary Care Physician: Cameron Harrier, MD  Note by: [name]. [name], M.D, DABA, DABAPM, DABPM, [name], FIPP  Referring Physician: Tracie Harrier, MD  Specialty: Board-Certified Interventional Pain Management  Last Visit to Pain Management: 01/11/2016   Primary Reason(s) for Visit: Encounter for evaluation before starting prescription medication management (Level of risk: moderate) CC: Back Pain and Leg Pain   HPI  [name] is a 70 y.o. year old, male patient, who returns today as an established patient. He has Discogenic low back pain; Clinical depression; BP (high blood pressure); Insomnia, psychophysiological; Lumbosacral radiculitis; Current tobacco use; Chronic low back pain (Location of Primary Source of Pain) (Central); Chronic pain; Lumbar spondylosis; Long term current use of opiate analgesic; Long term prescription opiate use; Opiate use (15 MME/Day); Encounter for therapeutic drug level monitoring; Encounter for pain management planning; Failed back surgical syndrome (S/P anterior fusion at L5-S1); Lumbar facet arthropathy; Chronic lower extremity pain (Location of Secondary source of pain) (Bilateral) (R>L); Chronic hip pain (Right); Osteoarthritis of hip (Right); Generalized anxiety disorder; Depression with anxiety; and Lumbar facet syndrome on his problem list.. His primarily concern today is the Back Pain and Leg Pain   Pain Assessment: Self-Reported Pain Score: 6 , clinically he looks like a 1-2/10. Reported level is inconsistent with clinical obrservations Pain Type: Chronic pain Pain Location: Back Pain Orientation: Lower Pain Descriptors / Indicators: Aching, Constant, Radiating Pain Frequency: Constant  The patient returns to the clinics today for follow-up evaluation  after his initial visit. He did see the medical psychologist, but that was seen only thing he did. He did not have any of the x-rays done ordered to rule out work that I had ordered. Today I started to talk to him about adjusting his medication and switching him from the Ultracet 37.5/325 one every 6 hours to the tramadol 50 mg 2 tablets every 6 hours. Unfortunately, soon as I started and without letting me even finish, he was already saying that that was not working. I asked him if he had been at these higher doses a he indicated that he had not so this triggered also to have a conversation about what exactly was at that he thought that we were going to do for him. Apparently he was on the impression that I would be taking over his medication and simply write for him some pain medicine. I told him that I would be treating him with a combination of interventional therapies and medication management which I would have to adjust. The first step would be to get rid of the Tylenol so as not to stress the liver. He said that the injections would not help and I asked him if I had done any for him and he indicated that I had not. But that he had had some injections done at [facility] by Dr. Nelva Montgomery and approximately 15 injections by [name]. Today I will have him sign some release forms and will try to get the notes from those injections to see what is said that they had done for him. In any case, I make sure for him to understand that my specialty is interventional therapy and that in terms of what I can do for him, it would involve a combination of interventional treatments with or without some medication management.  Date of Last  Visit: 01/11/16 Service Provided on Last Visit: Evaluation (initial patient evaluation)  Controlled Substance Pharmacotherapy Assessment & REMS (Risk Evaluation and Mitigation Strategy)  Analgesic: Ultracet 37.5/325 one every 6 hours (150 mg/day of tramadol) Pill Count: The  patient did not bring any of his pills. MME/day: 15 mg/day.  Pharmacokinetics: Onset of action (Liberation/Absorption): Within expected pharmacological parameters Time to Peak effect (Distribution): Timing and results are as within normal expected parameters Duration of action (Metabolism/Excretion): Within normal limits for medication Pharmacodynamics: Analgesic Effect: More than 50% Activity Facilitation: Medication(s) allow patient to sit, stand, walk, and do the basic ADLs Perceived Effectiveness: Described as relatively effective, allowing for increase in activities of daily living (ADL) Side-effects or Adverse reactions: None reported Monitoring: [place] PMP: Online review of the past 103-month period conducted. Compliant with practice rules and regulations UDS Results/interpretation: The patient's last UDS was done on 01/11/2016 aching back abnormal due to the presence of undeclared tizanidine, as well as the absence of oxycodone. Medication Assessment Form: Reviewed. Patient indicates being compliant with therapy Treatment compliance: Compliant Risk Assessment: Aberrant Behavior: extensive time discussing medication  Substance Use Disorder (SUD) Risk Level: Moderate Risk of opioid abuse or dependence: 0.7-3.0% with doses ? 36 MME/day and 6.1-26% with doses ? 120 MME/day. Opioid Risk Tool (ORT) Score:  3 Low Risk for SUD (Score <3) Depression Scale Score: PHQ-2: PHQ-2 Total Score: 0 No depression (0) PHQ-9: PHQ-9 Total Score: 0 No depression (0-4)  Pharmacologic Plan: Today we will discontinue the CENTER and start tramadol 50 mg 2 tablets every 6 hours (400 mg/day)  Laboratory Chemistry  Inflammation Markers No results found for: ESRSEDRATE, CRP  Renal Function Lab Results  Component Value Date   BUN 9 07/28/2012   CREATININE 0.74 07/28/2012   GFRAA >90 07/28/2012   GFRNONAA >90 07/28/2012    Hepatic Function Lab Results  Component Value Date   AST 30 07/28/2012   ALT 29  07/28/2012   ALBUMIN 3.7 07/28/2012    Electrolytes Lab Results  Component Value Date   NA 136 07/28/2012   K 4.1 07/28/2012   CL 99 07/28/2012   CALCIUM 9.0 07/28/2012    Pain Modulating Vitamins No results found for: VD25OH, VD125OH2TOT, H157544, [id number], VITAMINB12  Coagulation Parameters Lab Results  Component Value Date   INR 1.08 05/07/2012   LABPROT 14.2 05/07/2012    Note: I personally reviewed the above data. The patient did not have the x-rays or lab work that I ordered on his first visit.  Recent Diagnostic Imaging  Ct Chest W Contrast  07/14/2015  CLINICAL DATA:  Persistent cough for several weeks. Abnormal chest x-ray. EXAM: CT CHEST WITH CONTRAST TECHNIQUE: Multidetector CT imaging of the chest was performed during intravenous contrast administration. CONTRAST:  47mL OMNIPAQUE IOHEXOL 300 MG/ML  SOLN COMPARISON:  07/05/2015 [facility] chest x-ray FINDINGS: No supraclavicular or axillary adenopathy. Small scattered nonenlarged mediastinal, hilar and subcarinal lymph nodes. Normal heart size. No pericardial or pleural effusion. Small hiatal hernia noted. Debris within the distal esophagus suspicious for reflux. Wall thickening of the mid to lower esophagus can be seen with esophagitis. Intact thoracic aorta. Mild wall thickening without significant plaque formation. Major branch vessels remain patent. No dissection or mediastinal hemorrhage. Included upper abdomen demonstrates prior cholecystectomy. Small nonenlarged gastro hepatic ligament lymph nodes noted, image 48. No definite adenopathy. Symmetric gynecomastia present. Lung windows demonstrate minor anterior upper lobe subpleural scarring versus early fibrosis. No significant pulmonary nodule, mass, airspace process, collapse or consolidation. Negative for edema.  Minor left basilar subsegmental atelectasis versus scarring. Trachea central airways are patent. No pneumothorax. degenerative changes of the spine  diffusely. No acute osseous finding fracture. Sternum appears intact. IMPRESSION: No significant pulmonary nodule demonstrated by chest CT. Scattered mediastinal and hilar lymph nodes suspect reactive. Small hiatal hernia with distal esophageal wall thickening and intraluminal contents, suspect reflux esophagitis. Gynecomastia Prior cholecystectomy Minor upper lobe subpleural scarring/early fibrosis Left basilar atelectasis versus scarring Electronically Signed   By: Jerilynn Mages.  Shick M.D.   On: 07/14/2015 15:59    Meds  The patient has a current medication list which includes the following prescription(s): lorazepam, tizanidine, trazodone, and tramadol.  Current Outpatient Prescriptions on File Prior to Visit  Medication Sig  . LORazepam (ATIVAN) 0.5 MG tablet Take 0.5 mg by mouth daily as needed for anxiety. Reported on 02/26/2016  . traZODone (DESYREL) 100 MG tablet Take 100 mg by mouth at bedtime.   No current facility-administered medications on file prior to visit.    ROS  Constitutional: Denies any fever or chills Gastrointestinal: No reported hemesis, hematochezia, vomiting, or acute GI distress Musculoskeletal: Denies any acute onset joint swelling, redness, loss of ROM, or weakness Neurological: No reported episodes of acute onset apraxia, aphasia, dysarthria, agnosia, amnesia, paralysis, loss of coordination, or loss of consciousness  Allergies  Mr. Gaye has No Known Allergies.  [place]  Medical:  Mr. Pasternack  has a past medical history of Anxiety; Depression; Kidney stone; Arthritis; Hepatitis; Bell's palsy; Substance abuse; and Hypertension. Family: family history is not on file. Surgical:  has past surgical history that includes Cholecystectomy; Kidney stone surgery; Lumbar fusion (05/13/2012); and Anterior lumbar fusion (05/13/2012). Tobacco:  reports that he has been smoking Cigarettes.  He has a 26.5 pack-year smoking history. He does not have any smokeless tobacco history on  file. Alcohol:  reports that he drinks alcohol. Drug:  reports that he does not use illicit drugs.  Constitutional Exam  Vitals: Blood pressure 111/84, pulse 101, temperature 97.9 F (36.6 C), temperature source Oral, resp. rate 16, height 5\' 2"  (1.575 m), weight 152 lb (68.947 kg), SpO2 98 %. General appearance: Well nourished, well developed, and well hydrated. In no acute distress Calculated BMI/Body habitus: Body mass index is 27.79 kg/(m^2). (25-29.9 kg/m2) Overweight - 20% higher incidence of chronic pain Psych/Mental status: Alert and oriented x 3 (person, place, & time) Eyes: PERLA Respiratory: No evidence of acute respiratory distress  Cervical Spine Exam  Inspection: No masses, redness, or swelling Alignment: Symmetrical ROM: Functional: Adequate ROM Active: Unrestricted ROM Stability: No instability detected Muscle strength & Tone: Functionally intact Sensory: Unimpaired Palpation: No complaints of tenderness  Upper Extremity (UE) Exam    Side: Right upper extremity  Side: Left upper extremity  Inspection: No masses, redness, swelling, or asymmetry  Inspection: No masses, redness, swelling, or asymmetry  ROM:  ROM:  Functional: Adequate ROM  Functional: Adequate ROM  Active: Unrestricted ROM  Active: Unrestricted ROM  Muscle strength & Tone: Functionally intact  Muscle strength & Tone: Functionally intact  Sensory: Unimpaired  Sensory: Unimpaired  Palpation: Non-contributory  Palpation: Non-contributory   Thoracic Spine Exam  Inspection: No masses, redness, or swelling Alignment: Symmetrical ROM: Functional: Adequate ROM Active: Unrestricted ROM Stability: No instability detected Sensory: Unimpaired Muscle strength & Tone: Functionally intact Palpation: No complaints of tenderness  Lumbar Spine Exam  Inspection: No masses, redness, or swelling Alignment: Symmetrical ROM: Functional: Adequate ROM Active: Unrestricted ROM Stability: No instability  detected Muscle strength & Tone: Functionally intact Sensory:  Unimpaired Palpation: No complaints of tenderness Provocative Tests: Lumbar Hyperextension and rotation test: Positive bilaterally for lumbar facet pain Patrick's Maneuver: Positive bilaterally for sacroiliac joint pain and positive on the right side for hip joint pain.  Gait & Posture Assessment  Gait: Antalgic Posture: WNL  Lower Extremity Exam    Side: Right lower extremity  Side: Left lower extremity  Inspection: No masses, redness, swelling, or asymmetry ROM:  Inspection: No masses, redness, swelling, or asymmetry ROM:  Functional: Adequate ROM  Functional: Adequate ROM  Active: Unrestricted ROM  Active: Unrestricted ROM  Muscle strength & Tone: Functionally intact  Muscle strength & Tone: Functionally intact  Sensory: Unimpaired  Sensory: Unimpaired  Palpation: Non-contributory  Palpation: Non-contributory   Assessment & Plan  Primary Diagnosis & Pertinent Problem List: The primary encounter diagnosis was Chronic pain. Diagnoses of Chronic low back pain (Location of Primary Source of Pain) (Central), Chronic pain of lower extremity, unspecified laterality, Failed back surgical syndrome (S/P anterior fusion at L5-S1), Encounter for therapeutic drug level monitoring, and Long term current use of opiate analgesic were also pertinent to this visit.  Visit Diagnosis: 1. Chronic pain   2. Chronic low back pain (Location of Primary Source of Pain) (Central)   3. Chronic pain of lower extremity, unspecified laterality   4. Failed back surgical syndrome (S/P anterior fusion at L5-S1)   5. Encounter for therapeutic drug level monitoring   6. Long term current use of opiate analgesic     Problems updated and reviewed during this visit: Problem  Opiate use (15 MME/Day)    Problem-specific Plan(s): No problem-specific assessment & plan notes found for this encounter.  No new assessment & plan notes have been filed  under this hospital service since the last note was generated. Service: Pain Management   Plan of Care   Problem List Items Addressed This Visit      High   Chronic low back pain (Location of Primary Source of Pain) (Central) (Chronic)   Relevant Medications   tiZANidine (ZANAFLEX) 4 MG tablet   traMADol (ULTRAM) 50 MG tablet   Chronic lower extremity pain (Location of Secondary source of pain) (Bilateral) (R>L) (Chronic)   Chronic pain - Primary (Chronic)   Relevant Medications   tiZANidine (ZANAFLEX) 4 MG tablet   traMADol (ULTRAM) 50 MG tablet   Failed back surgical syndrome (S/P anterior fusion at L5-S1) (Chronic)   Relevant Medications   tiZANidine (ZANAFLEX) 4 MG tablet   traMADol (ULTRAM) 50 MG tablet     Medium   Encounter for therapeutic drug level monitoring   Long term current use of opiate analgesic (Chronic)       Pharmacotherapy (Medications Ordered): Meds ordered this encounter  Medications  . traMADol (ULTRAM) 50 MG tablet    Sig: Take 2 tablets (100 mg total) by mouth every 6 (six) hours as needed for severe pain.    Dispense:  240 tablet    Refill:  0    Do not place this medication, or any other prescription from our practice, on "Automatic Refill". Patient may have prescription filled one day early if pharmacy is closed on scheduled refill date. Do not fill until:  To last until:    [facility] & Procedure Ordered: No orders of the defined types were placed in this encounter.    Imaging Ordered: None  Interventional Therapies: Scheduled:  None at this point.    Considering:   1. Diagnostic bilateral lumbar facet block under fluoroscopic guidance and IV  sedation, possibly followed by radiofrequency ablation.  2. Diagnostic bilateral sacroiliac joint injection under fluoroscopic guidance and IV sedation, possibly followed by radiofrequency ablation.  3. Diagnostic right-sided intra-articular hip joint injection under fluoroscopic guidance, with or  without sedation.    PRN Procedures:  None at this time.    Referral(s) or Consult(s): None at this time.  New Prescriptions   TRAMADOL (ULTRAM) 50 MG TABLET    Take 2 tablets (100 mg total) by mouth every 6 (six) hours as needed for severe pain.    Medications administered during this visit: Mr. Charvat had no medications administered during this visit.  Requested PM Follow-up: Return in about 1 month (around 03/28/2016) for Medication Management, (1-Mo).  Future Appointments Date Time Provider [place]  03/27/2016 8:40 AM Milinda Pointer, MD [facility] None    Primary Care Physician: Cameron Harrier, MD Location: [facility] Note by: [name]. [name], M.D, DABA, DABAPM, DABPM, DABIPP, FIPP  Pain Score Disclaimer: We use the NRS-11 scale. This is a self-reported, subjective measurement of pain severity with only modest accuracy. It is used primarily to identify changes within a particular patient. It must be understood that outpatient pain scales are significantly less accurate that those used for research, where they can be applied under ideal controlled circumstances with minimal exposure to variables. In reality, the score is likely to be a combination of pain intensity and pain affect, where pain affect describes the degree of emotional arousal or changes in action readiness caused by the sensory experience of pain. Factors such as social and work situation, setting, emotional state, anxiety levels, expectation, and prior pain experience may influence pain perception and show large inter-individual differences that may also be affected by time variables.  Patient instructions provided during this appointment: Patient Instructions  Steps to Quit Smoking  Smoking tobacco can be harmful to your health and can affect almost every organ in your body. Smoking puts you, and those around you, at risk for developing many serious chronic diseases.  Quitting smoking is difficult, but it is one of the best things that you can do for your health. It is never too late to quit. WHAT ARE THE BENEFITS OF QUITTING SMOKING? When you quit smoking, you lower your risk of developing serious diseases and conditions, such as:  Lung cancer or lung disease, such as COPD.  Heart disease.  Stroke.  Heart attack.  Infertility.  Osteoporosis and bone fractures. Additionally, symptoms such as coughing, wheezing, and shortness of breath may get better when you quit. You may also find that you get sick less often because your body is stronger at fighting off colds and infections. If you are pregnant, quitting smoking can help to reduce your chances of having a baby of low birth weight. HOW DO I GET READY TO QUIT? When you decide to quit smoking, create a plan to make sure that you are successful. Before you quit:  Pick a date to quit. Set a date within the next two weeks to give you time to prepare.  Write down the reasons why you are quitting. Keep this list in places where you will see it often, such as on your bathroom mirror or in your car or wallet.  Identify the people, places, things, and activities that make you want to smoke (triggers) and avoid them. Make sure to take these actions:  Throw away all cigarettes at home, at work, and in your car.  Throw away smoking accessories, such as ashtrays  and lighters.  Clean your car and make sure to empty the ashtray.  Clean your home, including curtains and carpets.  Tell your family, friends, and coworkers that you are quitting. Support from your loved ones can make quitting easier.  Talk with your health care provider about your options for quitting smoking.  Find out what treatment options are covered by your health insurance. WHAT STRATEGIES CAN I USE TO QUIT SMOKING?  Talk with your healthcare provider about different strategies to quit smoking. Some strategies include:  Quitting smoking  altogether instead of gradually lessening how much you smoke over a period of time. Research shows that quitting "cold [place]" is more successful than gradually quitting.  Attending in-person counseling to help you build problem-solving skills. You are more likely to have success in quitting if you attend several counseling sessions. Even short sessions of 10 minutes can be effective.  Finding resources and support systems that can help you to quit smoking and remain smoke-free after you quit. These resources are most helpful when you use them often. They can include:  Online chats with a Social worker.  Telephone quitlines.  Printed Furniture conservator/restorer.  Support groups or group counseling.  Text messaging programs.  Mobile phone applications.  Taking medicines to help you quit smoking. (If you are pregnant or breastfeeding, talk with your health care provider first.) Some medicines contain nicotine and some do not. Both types of medicines help with cravings, but the medicines that include nicotine help to relieve withdrawal symptoms. Your health care provider may recommend:  Nicotine patches, gum, or lozenges.  Nicotine inhalers or sprays.  Non-nicotine medicine that is taken by mouth. Talk with your health care provider about combining strategies, such as taking medicines while you are also receiving in-person counseling. Using these two strategies together makes you more likely to succeed in quitting than if you used either strategy on its own. If you are pregnant or breastfeeding, talk with your health care provider about finding counseling or other support strategies to quit smoking. Do not take medicine to help you quit smoking unless told to do so by your health care provider. WHAT THINGS CAN I DO TO MAKE IT EASIER TO QUIT? Quitting smoking might feel overwhelming at first, but there is a lot that you can do to make it easier. Take these important actions:  Reach out to your family  and friends and ask that they support and encourage you during this time. Call telephone quitlines, reach out to support groups, or work with a counselor for support.  Ask people who smoke to avoid smoking around you.  Avoid places that trigger you to smoke, such as bars, parties, or smoke-break areas at work.  Spend time around people who do not smoke.  Lessen stress in your life, because stress can be a smoking trigger for some people. To lessen stress, try:  Exercising regularly.  Deep-breathing exercises.  Yoga.  Meditating.  Performing a body scan. This involves closing your eyes, scanning your body from head to toe, and noticing which parts of your body are particularly tense. Purposefully relax the muscles in those areas.  Download or purchase mobile phone or tablet apps (applications) that can help you stick to your quit plan by providing reminders, tips, and encouragement. There are many free apps, such as QuitGuide from the [employer] Office manager for Disease Control and Prevention). You can find other support for quitting smoking (smoking cessation) through smokefree.gov and other websites. HOW WILL I FEEL WHEN I  QUIT SMOKING? Within the first 24 hours of quitting smoking, you may start to feel some withdrawal symptoms. These symptoms are usually most noticeable 2-3 days after quitting, but they usually do not last beyond 2-3 weeks. Changes or symptoms that you might experience include:  Mood swings.  Restlessness, anxiety, or irritation.  Difficulty concentrating.  Dizziness.  Strong cravings for sugary foods in addition to nicotine.  Mild weight gain.  Constipation.  Nausea.  Coughing or a sore throat.  Changes in how your medicines work in your body.  A depressed mood.  Difficulty sleeping (insomnia). After the first 2-3 weeks of quitting, you may start to notice more positive results, such as:  Improved sense of smell and taste.  Decreased coughing and sore  throat.  Slower heart rate.  Lower blood pressure.  Clearer skin.  The ability to breathe more easily.  Fewer sick days. Quitting smoking is very challenging for most people. Do not get discouraged if you are not successful the first time. Some people need to make many attempts to quit before they achieve long-term success. Do your best to stick to your quit plan, and talk with your health care provider if you have any questions or concerns.   This information is not intended to replace advice given to you by your health care provider. Make sure you discuss any questions you have with your health care provider.   Document Released: 10/01/2001 Document Revised: 02/21/2015 Document Reviewed: 02/21/2015 Elsevier Interactive Patient Education 2016 [place] Can Quit Smoking If you are ready to quit smoking or are thinking about it, congratulations! You have chosen to help yourself be healthier and live longer! There are lots of different ways to quit smoking. Nicotine gum, nicotine patches, a nicotine inhaler, or nicotine nasal spray can help with physical craving. Hypnosis, support groups, and medicines help break the habit of smoking. TIPS TO GET OFF AND STAY OFF CIGARETTES  Learn to predict your moods. Do not let a bad situation be your excuse to have a cigarette. Some situations in your life might tempt you to have a cigarette.  Ask friends and co-workers not to smoke around you.  Make your home smoke-free.  Never have "just one" cigarette. It leads to wanting another and another. Remind yourself of your decision to quit.  On a card, make a list of your reasons for not smoking. Read it at least the same number of times a day as you have a cigarette. Tell yourself everyday, "I do not want to smoke. I choose not to smoke."  Ask someone at home or work to help you with your plan to quit smoking.  Have something planned after you eat or have a cup of coffee. Take a walk or get  other exercise to perk you up. This will help to keep you from overeating.  Try a relaxation exercise to calm you down and decrease your stress. Remember, you may be tense and nervous the first two weeks after you quit. This will pass.  Find new activities to keep your hands busy. Play with a pen, coin, or rubber band. Doodle or draw things on paper.  Brush your teeth right after eating. This will help cut down the craving for the taste of tobacco after meals. You can try mouthwash too.  Try gum, breath mints, or diet candy to keep something in your mouth. IF YOU SMOKE AND WANT TO QUIT:  Do not stock up on cigarettes. Never buy a carton.  Wait until one pack is finished before you buy another.  Never carry cigarettes with you at work or at home.  Keep cigarettes as far away from you as possible. Leave them with someone else.  Never carry matches or a lighter with you.  Ask yourself, "Do I need this cigarette or is this just a reflex?"  Bet with someone that you can quit. Put cigarette money in a piggy bank every morning. If you smoke, you give up the money. If you do not smoke, by the end of the week, you keep the money.  Keep trying. It takes 21 days to change a habit!  Talk to your doctor about using medicines to help you quit. These include nicotine replacement gum, lozenges, or skin patches.   This information is not intended to replace advice given to you by your health care provider. Make sure you discuss any questions you have with your health care provider.   Document Released: 08/03/2009 Document Revised: 12/30/2011 Document Reviewed: 08/03/2009 Elsevier Interactive Patient Education 2016 Elsevier Inc. Tramadol tablets What is this medicine? TRAMADOL (TRA ma dole) is a pain reliever. It is used to treat moderate to severe pain in adults. This medicine may be used for other purposes; ask your health care provider or pharmacist if you have questions. What should I tell my  health care provider before I take this medicine? They need to know if you have any of these conditions: -brain tumor -depression -drug abuse or addiction -head injury -if you frequently drink alcohol containing drinks -kidney disease or trouble passing urine -liver disease -lung disease, asthma, or breathing problems -seizures or epilepsy -suicidal thoughts, plans, or attempt; a previous suicide attempt by you or a family member -an unusual or allergic reaction to tramadol, codeine, other medicines, foods, dyes, or preservatives -pregnant or trying to get pregnant -breast-feeding How should I use this medicine? Take this medicine by mouth with a full glass of water. Follow the directions on the prescription label. If the medicine upsets your stomach, take it with food or milk. Do not take more medicine than you are told to take. Talk to your pediatrician regarding the use of this medicine in children. Special care may be needed. Overdosage: If you think you have taken too much of this medicine contact a poison control center or emergency room at once. NOTE: This medicine is only for you. Do not share this medicine with others. What if I miss a dose? If you miss a dose, take it as soon as you can. If it is almost time for your next dose, take only that dose. Do not take double or extra doses. What may interact with this medicine? Do not take this medicine with any of the following medications: -MAOIs like Carbex, Eldepryl, Marplan, Nardil, and Parnate This medicine may also interact with the following medications: -alcohol or medicines that contain alcohol -antihistamines -benzodiazepines -bupropion -carbamazepine or oxcarbazepine -clozapine -cyclobenzaprine -digoxin -furazolidone -linezolid -medicines for depression, anxiety, or psychotic disturbances -medicines for migraine headache like almotriptan, eletriptan, frovatriptan, naratriptan, rizatriptan, sumatriptan,  zolmitriptan -medicines for pain like pentazocine, buprenorphine, butorphanol, meperidine, nalbuphine, and propoxyphene -medicines for sleep -muscle relaxants -naltrexone -phenobarbital -phenothiazines like perphenazine, thioridazine, chlorpromazine, mesoridazine, fluphenazine, prochlorperazine, promazine, and trifluoperazine -procarbazine -warfarin This list may not describe all possible interactions. Give your health care provider a list of all the medicines, herbs, non-prescription drugs, or dietary supplements you use. Also tell them if you smoke, drink alcohol, or use illegal drugs. Some items may  interact with your medicine. What should I watch for while using this medicine? Tell your doctor or health care professional if your pain does not go away, if it gets worse, or if you have new or a different type of pain. You may develop tolerance to the medicine. Tolerance means that you will need a higher dose of the medicine for pain relief. Tolerance is normal and is expected if you take this medicine for a long time. Do not suddenly stop taking your medicine because you may develop a severe reaction. Your body becomes used to the medicine. This does NOT mean you are addicted. Addiction is a behavior related to getting and using a drug for a non-medical reason. If you have pain, you have a medical reason to take pain medicine. Your doctor will tell you how much medicine to take. If your doctor wants you to stop the medicine, the dose will be slowly lowered over time to avoid any side effects. You may get drowsy or dizzy. Do not drive, use machinery, or do anything that needs mental alertness until you know how this medicine affects you. Do not stand or sit up quickly, especially if you are an older patient. This reduces the risk of dizzy or fainting spells. Alcohol can increase or decrease the effects of this medicine. Avoid alcoholic drinks. You may have constipation. Try to have a bowel movement at  least every 2 to 3 days. If you do not have a bowel movement for 3 days, call your doctor or health care professional. Your mouth may get dry. Chewing sugarless gum or sucking hard candy, and drinking plenty of water may help. Contact your doctor if the problem does not go away or is severe. What side effects may I notice from receiving this medicine? Side effects that you should report to your doctor or health care professional as soon as possible: -allergic reactions like skin rash, itching or hives, swelling of the face, lips, or tongue -breathing difficulties, wheezing -confusion -itching -light headedness or fainting spells -redness, blistering, peeling or loosening of the skin, including inside the mouth -seizures Side effects that usually do not require medical attention (report to your doctor or health care professional if they continue or are bothersome): -constipation -dizziness -drowsiness -headache -nausea, vomiting This list may not describe all possible side effects. Call your doctor for medical advice about side effects. You may report side effects to FDA at 1-800-FDA-1088. Where should I keep my medicine? Keep out of the reach of children. This medicine may cause accidental overdose and death if it taken by other adults, children, or pets. Mix any unused medicine with a substance like cat litter or coffee grounds. Then throw the medicine away in a sealed container like a sealed bag or a coffee can with a lid. Do not use the medicine after the expiration date. Store at room temperature between 15 and 30 degrees C (59 and 86 degrees F). NOTE: This sheet is a summary. It may not cover all possible information. If you have questions about this medicine, talk to your doctor, pharmacist, or health care provider.    2016, Elsevier/Gold Standard. (2013-12-03 15:42:09)

## 2016-03-27 ENCOUNTER — Encounter: Payer: Self-pay | Admitting: Pain Medicine

## 2016-04-02 ENCOUNTER — Telehealth: Payer: Self-pay | Admitting: Pain Medicine

## 2016-04-02 NOTE — Telephone Encounter (Signed)
Patient's Name: [name]  Patient type: Established  MRN: [id number]  Service setting: Telephone Encounter  DOB: 1946-06-12  Location: [facility]  DOS: 04/02/2016  Primary Care Physician: Tracie Harrier, MD  Note by: Kathlen Brunswick. Dossie Arbour, M.D, DABA, DABAPM, DABPM, DABIPP, FIPP  Referring Physician: No ref. provider found  Specialty: Board-Certified Interventional Pain Management     Type of Phone Encounter: [employer] patient relations.   Re: Apparently the patient felt that I had been rude and that I had talked down to him on the last appointment. To the best of my recollection this was not accurate as I never do this with any my patient's. I am firm and clear when I speak to my patients but it is very troubling possible to predict if someone is going to interpret something else.  [place] ID: [employer] patient relations.  [place] Phone Number: [phone]. Option 2.  Reference #: [id number]  [place] FAX: N/A  Topic Discussed: Patient's complaint.  Response: Disagreed with the patient's perception of the situation.

## 2016-08-23 ENCOUNTER — Other Ambulatory Visit: Payer: Self-pay | Admitting: Neurological Surgery

## 2016-08-23 DIAGNOSIS — M5416 Radiculopathy, lumbar region: Secondary | ICD-10-CM

## 2016-09-11 ENCOUNTER — Ambulatory Visit
Admission: RE | Admit: 2016-09-11 | Discharge: 2016-09-11 | Disposition: A | Discharge status: Home / Self Care | Payer: Medicare PPO | Source: Ambulatory Visit | Attending: Neurological Surgery | Admitting: Neurological Surgery

## 2016-09-11 DIAGNOSIS — M5116 Intervertebral disc disorders with radiculopathy, lumbar region: Secondary | ICD-10-CM | POA: Diagnosis not present

## 2016-09-11 DIAGNOSIS — M48061 Spinal stenosis, lumbar region without neurogenic claudication: Secondary | ICD-10-CM | POA: Diagnosis not present

## 2016-09-11 DIAGNOSIS — Z9889 Other specified postprocedural states: Secondary | ICD-10-CM | POA: Diagnosis not present

## 2016-09-11 DIAGNOSIS — M5416 Radiculopathy, lumbar region: Secondary | ICD-10-CM | POA: Diagnosis present

## 2016-09-11 DIAGNOSIS — M5417 Radiculopathy, lumbosacral region: Secondary | ICD-10-CM | POA: Diagnosis present

## 2016-10-31 DIAGNOSIS — Z720 Tobacco use: Secondary | ICD-10-CM | POA: Diagnosis not present

## 2016-10-31 DIAGNOSIS — J40 Bronchitis, not specified as acute or chronic: Secondary | ICD-10-CM | POA: Diagnosis not present

## 2016-11-26 DIAGNOSIS — I10 Essential (primary) hypertension: Secondary | ICD-10-CM | POA: Diagnosis not present

## 2016-11-26 DIAGNOSIS — F329 Major depressive disorder, single episode, unspecified: Secondary | ICD-10-CM | POA: Diagnosis not present

## 2016-11-26 DIAGNOSIS — Z720 Tobacco use: Secondary | ICD-10-CM | POA: Diagnosis not present

## 2016-11-26 DIAGNOSIS — F5102 Adjustment insomnia: Secondary | ICD-10-CM | POA: Diagnosis not present

## 2016-11-26 DIAGNOSIS — M5136 Other intervertebral disc degeneration, lumbar region: Secondary | ICD-10-CM | POA: Diagnosis not present

## 2016-11-26 DIAGNOSIS — Z0000 Encounter for general adult medical examination without abnormal findings: Secondary | ICD-10-CM | POA: Diagnosis not present

## 2016-11-26 DIAGNOSIS — E7800 Pure hypercholesterolemia, unspecified: Secondary | ICD-10-CM | POA: Diagnosis not present

## 2017-01-20 DIAGNOSIS — M79604 Pain in right leg: Secondary | ICD-10-CM | POA: Diagnosis not present

## 2017-01-20 DIAGNOSIS — M79605 Pain in left leg: Secondary | ICD-10-CM | POA: Diagnosis not present

## 2017-01-20 DIAGNOSIS — R600 Localized edema: Secondary | ICD-10-CM | POA: Diagnosis not present

## 2017-01-20 DIAGNOSIS — G8929 Other chronic pain: Secondary | ICD-10-CM | POA: Diagnosis not present

## 2017-01-29 DIAGNOSIS — I10 Essential (primary) hypertension: Secondary | ICD-10-CM | POA: Diagnosis not present

## 2017-01-29 DIAGNOSIS — M5417 Radiculopathy, lumbosacral region: Secondary | ICD-10-CM | POA: Diagnosis not present

## 2017-01-29 DIAGNOSIS — G8929 Other chronic pain: Secondary | ICD-10-CM | POA: Diagnosis not present

## 2017-01-29 DIAGNOSIS — R600 Localized edema: Secondary | ICD-10-CM | POA: Diagnosis not present

## 2017-01-29 DIAGNOSIS — F5102 Adjustment insomnia: Secondary | ICD-10-CM | POA: Diagnosis not present

## 2017-01-29 DIAGNOSIS — M5440 Lumbago with sciatica, unspecified side: Secondary | ICD-10-CM | POA: Diagnosis not present

## 2017-01-29 DIAGNOSIS — R0602 Shortness of breath: Secondary | ICD-10-CM | POA: Diagnosis not present

## 2017-01-29 DIAGNOSIS — Z8619 Personal history of other infectious and parasitic diseases: Secondary | ICD-10-CM | POA: Diagnosis not present

## 2017-02-04 ENCOUNTER — Encounter (INDEPENDENT_AMBULATORY_CARE_PROVIDER_SITE_OTHER): Payer: Medicare HMO | Admitting: Vascular Surgery

## 2017-02-10 ENCOUNTER — Encounter (INDEPENDENT_AMBULATORY_CARE_PROVIDER_SITE_OTHER): Payer: Self-pay | Admitting: Vascular Surgery

## 2017-02-10 ENCOUNTER — Ambulatory Visit (INDEPENDENT_AMBULATORY_CARE_PROVIDER_SITE_OTHER): Payer: Medicare HMO | Admitting: Vascular Surgery

## 2017-02-10 VITALS — BP 124/72 | HR 74 | Resp 16 | Ht 62.0 in | Wt 159.0 lb

## 2017-02-10 DIAGNOSIS — M7989 Other specified soft tissue disorders: Secondary | ICD-10-CM | POA: Diagnosis not present

## 2017-02-10 DIAGNOSIS — G8929 Other chronic pain: Secondary | ICD-10-CM | POA: Diagnosis not present

## 2017-02-10 DIAGNOSIS — M545 Low back pain: Secondary | ICD-10-CM | POA: Diagnosis not present

## 2017-02-10 DIAGNOSIS — Z720 Tobacco use: Secondary | ICD-10-CM | POA: Diagnosis not present

## 2017-02-10 DIAGNOSIS — M79606 Pain in leg, unspecified: Secondary | ICD-10-CM | POA: Diagnosis not present

## 2017-02-10 NOTE — Progress Notes (Signed)
Subjective:    Patient ID: [name], male    DOB: 1946-05-23, 71 y.o.   MRN: [id number] Chief Complaint  Patient presents with  . New Evaluation    Leg pain   Presents as a new patient referred by Dr. Ginette Pitman for evaluation of pain and swelling in the lower extremity. Patient states spinal fusion surgery in 2016. Since then he has been experiencing bilateral lower extremity pain. States he can only walk 10-20 feet before he has to stop due to pain radiating down both lower extremities. Pain is located in his calf. This has been progressively worsening. He states he has been told it is "sciatica". Denies rest pain or ulceration to his lower extremity. He also experiences bilateral swelling. States swelling is constant. Does not worsen during the day or with standing. He was given a diuretic which helped with the swelling. Denies any fever, nausea or vomiting.    Review of Systems  Constitutional: Negative.   HENT: Negative.   Eyes: Negative.   Respiratory: Negative.   Cardiovascular: Positive for leg swelling.       Lower Extremity Pain  Gastrointestinal: Negative.   Endocrine: Negative.   Genitourinary: Negative.   Musculoskeletal: Negative.   Skin: Negative.   Allergic/Immunologic: Negative.   Neurological: Negative.   Hematological: Negative.   Psychiatric/Behavioral: Negative.       Objective:   Physical Exam  Constitutional: He is oriented to person, place, and time. He appears well-developed and well-nourished. No distress.  HENT:  Head: Normocephalic and atraumatic.  Right Ear: External ear normal.  Left Ear: External ear normal.  Eyes: Pupils are equal, round, and reactive to light.  Neck: Normal range of motion.  Cardiovascular: Normal rate, regular rhythm, normal heart sounds and intact distal pulses.   Pulses:      Radial pulses are 2+ on the right side, and 2+ on the left side.       Dorsalis pedis pulses are 1+ on the right side, and 1+ on the left side.     Posterior tibial pulses are 1+ on the right side, and 1+ on the left side.  Bilateral feet are warm  Pulmonary/Chest: Effort normal.  Musculoskeletal: Normal range of motion. He exhibits edema (Mild edema - bilateral feet. L>R).  Neurological: He is alert and oriented to person, place, and time.  Skin: Skin is warm and dry. He is not diaphoretic.  Psychiatric: He has a normal mood and affect. His behavior is normal. Judgment and thought content normal.  Vitals reviewed.  BP 124/72 (BP Location: Right Arm)   Pulse 74   Resp 16   Ht 5\' 2"  (1.575 m)   Wt 159 lb (72.1 kg)   BMI 29.08 kg/m   Past Medical History:  Diagnosis Date  . Anxiety   . Arthritis   . Bell's palsy    hx of, right side  . Depression    hx of depression  . Hepatitis    hx of hepatitis C  . Hypertension   . Kidney stone    hx of  . Substance abuse    heroin   Social History   Social History  . Marital status: Single    Spouse name: N/A  . Number of children: N/A  . Years of education: N/A   Occupational History  . Not on file.   Social History Main Topics  . Smoking status: Current Every Day Smoker    Packs/day: 0.50    Years:  53.00    Types: Cigarettes  . Smokeless tobacco: Never Used  . Alcohol use 0.0 oz/week  . Drug use: No  . Sexual activity: Yes   Other Topics Concern  . Not on file   Social History Narrative   ** Merged History Encounter **       Past Surgical History:  Procedure Laterality Date  . ANTERIOR LUMBAR FUSION  05/13/2012   Procedure: ANTERIOR LUMBAR FUSION 1 LEVEL;  Surgeon: Melina Schools, MD;  Location: [facility];  Service: Orthopedics;  Laterality: N/A;  ALIF L5-S1  . CHOLECYSTECTOMY    . KIDNEY STONE SURGERY    . LUMBAR FUSION  05/13/2012   No family history on file.  No Known Allergies     Assessment & Plan:  Presents as a new patient referred by Dr. Ginette Pitman for evaluation of pain and swelling in the lower extremity. Patient states spinal fusion surgery in  2016. Since then he has been experiencing bilateral lower extremity pain. States he can only walk 10-20 feet before he has to stop due to pain radiating down both lower extremities. Pain is located in his calf. This has been progressively worsening. He states he has been told it is "sciatica". Denies rest pain or ulceration to his lower extremity. He also experiences bilateral swelling. States swelling is constant. Does not worsen during the day or with standing. He was given a diuretic which helped with the swelling. Denies any fever, nausea or vomiting.   1. Chronic bilateral low back pain, with sciatica presence unspecified - Stable Fusion in 2016. This may be a contributing factor. Patient states seen two orthopod's which told him the pain was not coming from his spine.   2. Current tobacco use - Stable I have discussed (approximately 5 minutes) with the patient the role of tobacco in the pathogenesis of atherosclerosis and its effect on the progression of the disease, impact on the durability of interventions and its limitations on the formation of collateral pathways. I have recommended absolute tobacco cessation. I have discussed various options available for assistance with tobacco cessation including over the counter methods (Nicotine gum, patch and lozenges). We also discussed prescription options (Chantix, Nicotine Inhaler / Nasal Spray). The patient is not interested in pursuing any prescription tobacco cessation options at this time. The patient voices their understanding.   3. Pain and swelling of lower extremity, unspecified laterality - New Symptoms concerning for PAD. Patient with multiple risk factors. Will order ABI to rule out any contributing PAD Patient with lower extremity edema Will order venous duplex to rule out reflux what may be contributing The patient was encouraged to wear graduated compression stockings (20-30 mmHg) on a daily basis. The patient was instructed to begin  wearing the stockings first thing in the morning and removing them in the evening. The patient was instructed specifically not to sleep in the stockings. Prescription given. In addition, behavioral modification including elevation during the day will be initiated.  Current Outpatient Prescriptions on File Prior to Visit  Medication Sig Dispense Refill  . LORazepam (ATIVAN) 0.5 MG tablet Take 0.5 mg by mouth daily as needed for anxiety. Reported on 02/26/2016    . tiZANidine (ZANAFLEX) 4 MG tablet Take by mouth.    . traMADol (ULTRAM) 50 MG tablet Take 2 tablets (100 mg total) by mouth every 6 (six) hours as needed for severe pain. 240 tablet 0  . traZODone (DESYREL) 100 MG tablet Take 100 mg by mouth at bedtime.  No current facility-administered medications on file prior to visit.     There are no Patient Instructions on file for this visit. No Follow-up on file.   Kymiah Araiza A Oralee Rapaport, PA-C

## 2017-02-19 ENCOUNTER — Encounter (INDEPENDENT_AMBULATORY_CARE_PROVIDER_SITE_OTHER): Payer: Medicare HMO | Admitting: Vascular Surgery

## 2017-03-06 ENCOUNTER — Other Ambulatory Visit: Payer: Self-pay | Admitting: Student

## 2017-03-06 DIAGNOSIS — B182 Chronic viral hepatitis C: Secondary | ICD-10-CM

## 2017-03-06 DIAGNOSIS — Z5320 Procedure and treatment not carried out because of patient's decision for unspecified reasons: Secondary | ICD-10-CM | POA: Diagnosis not present

## 2017-03-11 ENCOUNTER — Ambulatory Visit
Admission: RE | Admit: 2017-03-11 | Discharge: 2017-03-11 | Disposition: A | Discharge status: Home / Self Care | Payer: Medicare PPO | Source: Ambulatory Visit | Attending: Student | Admitting: Student

## 2017-03-11 DIAGNOSIS — B182 Chronic viral hepatitis C: Secondary | ICD-10-CM

## 2017-03-12 ENCOUNTER — Ambulatory Visit
Admission: RE | Admit: 2017-03-12 | Discharge: 2017-03-12 | Disposition: A | Discharge status: Home / Self Care | Payer: Medicare PPO | Source: Ambulatory Visit | Attending: Student | Admitting: Student

## 2017-03-12 DIAGNOSIS — N200 Calculus of kidney: Secondary | ICD-10-CM | POA: Insufficient documentation

## 2017-03-12 DIAGNOSIS — Z9049 Acquired absence of other specified parts of digestive tract: Secondary | ICD-10-CM | POA: Insufficient documentation

## 2017-03-12 DIAGNOSIS — K739 Chronic hepatitis, unspecified: Secondary | ICD-10-CM | POA: Diagnosis not present

## 2017-03-12 DIAGNOSIS — B182 Chronic viral hepatitis C: Secondary | ICD-10-CM | POA: Insufficient documentation

## 2017-03-19 DIAGNOSIS — Z720 Tobacco use: Secondary | ICD-10-CM | POA: Diagnosis not present

## 2017-03-19 DIAGNOSIS — B182 Chronic viral hepatitis C: Secondary | ICD-10-CM | POA: Diagnosis not present

## 2017-03-19 DIAGNOSIS — F5102 Adjustment insomnia: Secondary | ICD-10-CM | POA: Diagnosis not present

## 2017-03-19 DIAGNOSIS — I10 Essential (primary) hypertension: Secondary | ICD-10-CM | POA: Diagnosis not present

## 2017-03-19 DIAGNOSIS — M5136 Other intervertebral disc degeneration, lumbar region: Secondary | ICD-10-CM | POA: Diagnosis not present

## 2017-03-19 DIAGNOSIS — F329 Major depressive disorder, single episode, unspecified: Secondary | ICD-10-CM | POA: Diagnosis not present

## 2017-03-26 DIAGNOSIS — B182 Chronic viral hepatitis C: Secondary | ICD-10-CM | POA: Diagnosis not present

## 2017-03-26 DIAGNOSIS — R3129 Other microscopic hematuria: Secondary | ICD-10-CM | POA: Diagnosis not present

## 2017-03-26 DIAGNOSIS — M48062 Spinal stenosis, lumbar region with neurogenic claudication: Secondary | ICD-10-CM | POA: Diagnosis not present

## 2017-03-26 DIAGNOSIS — F5102 Adjustment insomnia: Secondary | ICD-10-CM | POA: Diagnosis not present

## 2017-03-26 DIAGNOSIS — I10 Essential (primary) hypertension: Secondary | ICD-10-CM | POA: Diagnosis not present

## 2017-03-26 DIAGNOSIS — M5417 Radiculopathy, lumbosacral region: Secondary | ICD-10-CM | POA: Diagnosis not present

## 2017-03-26 DIAGNOSIS — Z720 Tobacco use: Secondary | ICD-10-CM | POA: Diagnosis not present

## 2017-03-26 DIAGNOSIS — R739 Hyperglycemia, unspecified: Secondary | ICD-10-CM | POA: Diagnosis not present

## 2017-04-17 ENCOUNTER — Ambulatory Visit (INDEPENDENT_AMBULATORY_CARE_PROVIDER_SITE_OTHER): Payer: Medicare HMO | Admitting: Vascular Surgery

## 2017-04-17 ENCOUNTER — Encounter (INDEPENDENT_AMBULATORY_CARE_PROVIDER_SITE_OTHER): Payer: Self-pay | Admitting: Vascular Surgery

## 2017-04-17 ENCOUNTER — Ambulatory Visit (INDEPENDENT_AMBULATORY_CARE_PROVIDER_SITE_OTHER): Payer: Medicare HMO

## 2017-04-17 VITALS — BP 142/79 | HR 85 | Resp 14 | Ht 62.0 in | Wt 155.0 lb

## 2017-04-17 DIAGNOSIS — M79606 Pain in leg, unspecified: Secondary | ICD-10-CM

## 2017-04-17 DIAGNOSIS — I10 Essential (primary) hypertension: Secondary | ICD-10-CM

## 2017-04-17 DIAGNOSIS — I739 Peripheral vascular disease, unspecified: Secondary | ICD-10-CM | POA: Diagnosis not present

## 2017-04-17 DIAGNOSIS — M545 Low back pain: Secondary | ICD-10-CM

## 2017-04-17 DIAGNOSIS — M7989 Other specified soft tissue disorders: Secondary | ICD-10-CM

## 2017-04-17 DIAGNOSIS — G8929 Other chronic pain: Secondary | ICD-10-CM

## 2017-04-20 DIAGNOSIS — I739 Peripheral vascular disease, unspecified: Secondary | ICD-10-CM | POA: Insufficient documentation

## 2017-04-20 NOTE — Progress Notes (Signed)
MRN : [id number]  [name] is a 71 y.o. ([birth year]) male who presents with chief complaint of  Chief Complaint  Patient presents with  . Re-evaluation    U/S follow up  .  History of Present Illness: The patient is seen for evaluation of painful lower extremities. Patient notes the pain is variable and not always associated with activity.  The pain is somewhat consistent day to day occurring on most days. The patient notes the pain also occurs with standing and routinely seems worse as the day wears on. The pain has been progressive over the past several years. The patient states these symptoms are causing  a profound negative impact on quality of life and daily activities.  The patient denies rest pain or dangling of an extremity off the side of the bed during the night for relief. No open wounds or sores at this time. No history of DVT or phlebitis. No prior interventions or surgeries.  There is a  history of back problems and DJD of the lumbar and sacral spine.   ABI's Rt=0.66 and Lt=0.53 Venous duplex bilateral lower extremities no DVT no reflux  Current Meds  Medication Sig  . cloNIDine (CATAPRES) 0.1 MG tablet   . LORazepam (ATIVAN) 0.5 MG tablet Take 0.5 mg by mouth daily as needed for anxiety. Reported on 02/26/2016  . tiZANidine (ZANAFLEX) 4 MG tablet Take by mouth.  . traMADol-acetaminophen (ULTRACET) 37.5-325 MG tablet TK 1 T PO Q 8 H PRN P  . traZODone (DESYREL) 100 MG tablet Take 100 mg by mouth at bedtime.  . [DISCONTINUED] Ledipasvir-Sofosbuvir 90-400 MG TABS Take by mouth.  . [DISCONTINUED] traMADol (ULTRAM) 50 MG tablet Take 2 tablets (100 mg total) by mouth every 6 (six) hours as needed for severe pain.    Past Medical History:  Diagnosis Date  . Anxiety   . Arthritis   . Bell's palsy    hx of, right side  . Depression    hx of depression  . Hepatitis    hx of hepatitis C  . Hypertension   . Kidney stone    hx of  . Substance abuse    heroin     Past Surgical History:  Procedure Laterality Date  . ANTERIOR LUMBAR FUSION  05/13/2012   Procedure: ANTERIOR LUMBAR FUSION 1 LEVEL;  Surgeon: Melina Schools, MD;  Location: [facility];  Service: Orthopedics;  Laterality: N/A;  ALIF L5-S1  . CHOLECYSTECTOMY    . KIDNEY STONE SURGERY    . LUMBAR FUSION  05/13/2012    Social History Social History  Substance Use Topics  . Smoking status: Current Every Day Smoker    Packs/day: 0.50    Years: 53.00    Types: Cigarettes  . Smokeless tobacco: Never Used  . Alcohol use 0.0 oz/week    Family History No family history on file.  No Known Allergies   REVIEW OF SYSTEMS (Negative unless checked)  Constitutional: [] Weight loss  [] Fever  [] Chills Cardiac: [] Chest pain   [] Chest pressure   [] Palpitations   [] Shortness of breath when laying flat   [] Shortness of breath with exertion. Vascular:  [x] Pain in legs with walking   [x] Pain in legs at rest  [] History of DVT   [] Phlebitis   [x] Swelling in legs   [] Varicose veins   [] Non-healing ulcers Pulmonary:   [] Uses home oxygen   [] Productive cough   [] Hemoptysis   [] Wheeze  [] COPD   [] Asthma Neurologic:  [] Dizziness   [] Seizures   []   History of stroke   [] History of TIA  [] Aphasia   [] Vissual changes   [] Weakness or numbness in arm   [] Weakness or numbness in leg Musculoskeletal:   [] Joint swelling   [x] Joint pain   [x] Low back pain Hematologic:  [] Easy bruising  [] Easy bleeding   [] Hypercoagulable state   [] Anemic Gastrointestinal:  [] Diarrhea   [] Vomiting  [] Gastroesophageal reflux/heartburn   [] Difficulty swallowing. Genitourinary:  [] Chronic kidney disease   [] Difficult urination  [] Frequent urination   [] Blood in urine Skin:  [] Rashes   [] Ulcers  Psychological:  [] History of anxiety   []  History of major depression.  Physical Examination  Vitals:   04/17/17 1415  BP: (!) 142/79  Pulse: 85  Resp: 14  Weight: 155 lb (70.3 kg)  Height: 5\' 2"  (1.575 m)   Body mass index is 28.35  kg/m. Gen: WD/WN, NAD Head: [place]/AT, No temporalis wasting.  Ear/Nose/Throat: Hearing grossly intact, nares w/o erythema or drainage Eyes: PER, EOMI, sclera nonicteric.  Neck: Supple, no large masses.   Pulmonary:  Good air movement, no audible wheezing bilaterally, no use of accessory muscles.  Cardiac: RRR, no JVD Vascular: 2+ edema mild venous disease/skin changes Vessel Right Left  Radial Palpable Palpable  Ulnar Palpable Palpable  Brachial Palpable Palpable  Carotid Palpable Palpable  Femoral Trace Palpable Trace Palpable  Popliteal Not Palpable Not Palpable  PT Not Palpable Not Palpable  DP Not Palpable Not Palpable  Gastrointestinal: Non-distended. No guarding/no peritoneal signs.  Musculoskeletal: M/S 5/5 throughout.  No deformity or atrophy.  Neurologic: CN 2-12 intact. Symmetrical.  Speech is fluent. Motor exam as listed above. Psychiatric: Judgment intact, Mood & affect appropriate for pt's clinical situation. Dermatologic: mild rashes no ulcers noted.  No changes consistent with cellulitis. Lymph : No lichenification or skin changes of chronic lymphedema.  CBC Lab Results  Component Value Date   WBC 8.6 07/28/2012   HGB 13.8 07/28/2012   HCT 41.8 07/28/2012   MCV 90.3 07/28/2012   PLT 187 07/28/2012    BMET    Component Value Date/Time   NA 136 07/28/2012 1400   K 4.1 07/28/2012 1400   CL 99 07/28/2012 1400   CO2 24 07/28/2012 1400   GLUCOSE 88 07/28/2012 1400   BUN 9 07/28/2012 1400   CREATININE 0.74 07/28/2012 1400   CALCIUM 9.0 07/28/2012 1400   GFRNONAA >90 07/28/2012 1400   GFRAA >90 07/28/2012 1400   CrCl cannot be calculated (Patient's most recent lab result is older than the maximum 21 days allowed.).  COAG Lab Results  Component Value Date   INR 1.08 05/07/2012    Radiology No results found.  Assessment/Plan 1. Pain and swelling of lower extremity, unspecified laterality Recommend:  Patient should undergo arterial duplex of the lower  extremity ASAP because there has been a significant deterioration in the patient's lower extremity symptoms.  The patient states they are having increased pain and a marked decrease in the distance that they can walk.  The risks and benefits as well as the alternatives were discussed in detail with the patient.  All questions were answered.  Patient agrees to proceed and understands this could be a prelude to angiography and intervention.  The patient will follow up with me in the office to review the studies.   - VAS US AORTA/IVC/ILIACS; Future - VAS [place] LOWER EXTREMITY ARTERIAL DUPLEX; Future  2. PAD (peripheral artery disease) ([place]) See #1  - VAS US AORTA/IVC/ILIACS; Future - VAS [place] LOWER EXTREMITY ARTERIAL DUPLEX; Future  3. Chronic bilateral low back pain, with sciatica presence unspecified This is still a very likely major component of his leg pain but given the abnormal ABI's arterial disease is certainly present  4. Essential hypertension Continue antihypertensive medications as already ordered, these medications have been reviewed and there are no changes at this time.   Hortencia Pilar, MD  04/20/2017 5:10 PM

## 2017-04-25 DIAGNOSIS — B182 Chronic viral hepatitis C: Secondary | ICD-10-CM | POA: Diagnosis not present

## 2017-05-15 DIAGNOSIS — B182 Chronic viral hepatitis C: Secondary | ICD-10-CM | POA: Diagnosis not present

## 2017-06-02 DIAGNOSIS — B182 Chronic viral hepatitis C: Secondary | ICD-10-CM | POA: Diagnosis not present

## 2017-06-05 ENCOUNTER — Encounter (INDEPENDENT_AMBULATORY_CARE_PROVIDER_SITE_OTHER): Payer: Medicare HMO

## 2017-06-05 ENCOUNTER — Ambulatory Visit (INDEPENDENT_AMBULATORY_CARE_PROVIDER_SITE_OTHER): Payer: Medicare HMO | Admitting: Vascular Surgery

## 2017-06-24 DIAGNOSIS — Z720 Tobacco use: Secondary | ICD-10-CM | POA: Diagnosis not present

## 2017-06-24 DIAGNOSIS — R05 Cough: Secondary | ICD-10-CM | POA: Diagnosis not present

## 2017-06-24 DIAGNOSIS — B182 Chronic viral hepatitis C: Secondary | ICD-10-CM | POA: Diagnosis not present

## 2017-06-24 DIAGNOSIS — I10 Essential (primary) hypertension: Secondary | ICD-10-CM | POA: Diagnosis not present

## 2017-06-24 DIAGNOSIS — M5417 Radiculopathy, lumbosacral region: Secondary | ICD-10-CM | POA: Diagnosis not present

## 2017-06-24 DIAGNOSIS — F5102 Adjustment insomnia: Secondary | ICD-10-CM | POA: Diagnosis not present

## 2017-07-09 DIAGNOSIS — B182 Chronic viral hepatitis C: Secondary | ICD-10-CM | POA: Diagnosis not present

## 2017-07-21 ENCOUNTER — Encounter (INDEPENDENT_AMBULATORY_CARE_PROVIDER_SITE_OTHER): Payer: Self-pay

## 2017-07-21 ENCOUNTER — Ambulatory Visit (INDEPENDENT_AMBULATORY_CARE_PROVIDER_SITE_OTHER): Payer: Self-pay | Admitting: Vascular Surgery

## 2017-08-06 DIAGNOSIS — R3129 Other microscopic hematuria: Secondary | ICD-10-CM | POA: Diagnosis not present

## 2017-08-06 DIAGNOSIS — I10 Essential (primary) hypertension: Secondary | ICD-10-CM | POA: Diagnosis not present

## 2017-08-06 DIAGNOSIS — M48062 Spinal stenosis, lumbar region with neurogenic claudication: Secondary | ICD-10-CM | POA: Diagnosis not present

## 2017-08-06 DIAGNOSIS — R739 Hyperglycemia, unspecified: Secondary | ICD-10-CM | POA: Diagnosis not present

## 2017-08-06 DIAGNOSIS — M5417 Radiculopathy, lumbosacral region: Secondary | ICD-10-CM | POA: Diagnosis not present

## 2017-08-06 DIAGNOSIS — F5102 Adjustment insomnia: Secondary | ICD-10-CM | POA: Diagnosis not present

## 2017-08-06 DIAGNOSIS — Z720 Tobacco use: Secondary | ICD-10-CM | POA: Diagnosis not present

## 2017-08-25 DIAGNOSIS — F329 Major depressive disorder, single episode, unspecified: Secondary | ICD-10-CM | POA: Diagnosis not present

## 2017-08-25 DIAGNOSIS — Z23 Encounter for immunization: Secondary | ICD-10-CM | POA: Diagnosis not present

## 2017-08-25 DIAGNOSIS — M5136 Other intervertebral disc degeneration, lumbar region: Secondary | ICD-10-CM | POA: Diagnosis not present

## 2017-08-25 DIAGNOSIS — Z0000 Encounter for general adult medical examination without abnormal findings: Secondary | ICD-10-CM | POA: Diagnosis not present

## 2017-08-25 DIAGNOSIS — Z720 Tobacco use: Secondary | ICD-10-CM | POA: Diagnosis not present

## 2017-08-25 DIAGNOSIS — I10 Essential (primary) hypertension: Secondary | ICD-10-CM | POA: Diagnosis not present

## 2017-11-10 DIAGNOSIS — B182 Chronic viral hepatitis C: Secondary | ICD-10-CM | POA: Diagnosis not present

## 2017-12-18 DIAGNOSIS — I10 Essential (primary) hypertension: Secondary | ICD-10-CM | POA: Diagnosis not present

## 2017-12-18 DIAGNOSIS — Z720 Tobacco use: Secondary | ICD-10-CM | POA: Diagnosis not present

## 2017-12-18 DIAGNOSIS — M5136 Other intervertebral disc degeneration, lumbar region: Secondary | ICD-10-CM | POA: Diagnosis not present

## 2017-12-18 DIAGNOSIS — F329 Major depressive disorder, single episode, unspecified: Secondary | ICD-10-CM | POA: Diagnosis not present

## 2017-12-18 DIAGNOSIS — Z0000 Encounter for general adult medical examination without abnormal findings: Secondary | ICD-10-CM | POA: Diagnosis not present

## 2018-02-18 DIAGNOSIS — I639 Cerebral infarction, unspecified: Secondary | ICD-10-CM

## 2018-02-18 HISTORY — DX: Cerebral infarction, unspecified: I63.9

## 2018-03-10 ENCOUNTER — Other Ambulatory Visit: Payer: Self-pay | Admitting: Internal Medicine

## 2018-03-10 DIAGNOSIS — I10 Essential (primary) hypertension: Secondary | ICD-10-CM

## 2018-03-10 DIAGNOSIS — R42 Dizziness and giddiness: Secondary | ICD-10-CM

## 2018-03-10 DIAGNOSIS — R2681 Unsteadiness on feet: Secondary | ICD-10-CM

## 2018-03-10 DIAGNOSIS — H5319 Other subjective visual disturbances: Secondary | ICD-10-CM

## 2018-03-12 ENCOUNTER — Observation Stay
Admission: EM | Admit: 2018-03-12 | Discharge: 2018-03-13 | Disposition: A | Discharge status: Home / Self Care | Payer: Medicare HMO | Attending: Internal Medicine | Admitting: Internal Medicine

## 2018-03-12 ENCOUNTER — Other Ambulatory Visit: Payer: Self-pay

## 2018-03-12 ENCOUNTER — Emergency Department: Payer: Medicare HMO

## 2018-03-12 ENCOUNTER — Encounter: Payer: Self-pay | Admitting: *Deleted

## 2018-03-12 DIAGNOSIS — F329 Major depressive disorder, single episode, unspecified: Secondary | ICD-10-CM | POA: Insufficient documentation

## 2018-03-12 DIAGNOSIS — G8929 Other chronic pain: Secondary | ICD-10-CM | POA: Insufficient documentation

## 2018-03-12 DIAGNOSIS — I639 Cerebral infarction, unspecified: Secondary | ICD-10-CM | POA: Diagnosis present

## 2018-03-12 DIAGNOSIS — M549 Dorsalgia, unspecified: Secondary | ICD-10-CM | POA: Diagnosis not present

## 2018-03-12 DIAGNOSIS — I63531 Cerebral infarction due to unspecified occlusion or stenosis of right posterior cerebral artery: Secondary | ICD-10-CM

## 2018-03-12 DIAGNOSIS — F17210 Nicotine dependence, cigarettes, uncomplicated: Secondary | ICD-10-CM | POA: Insufficient documentation

## 2018-03-12 DIAGNOSIS — Z79899 Other long term (current) drug therapy: Secondary | ICD-10-CM | POA: Diagnosis not present

## 2018-03-12 DIAGNOSIS — F411 Generalized anxiety disorder: Secondary | ICD-10-CM | POA: Diagnosis not present

## 2018-03-12 DIAGNOSIS — I6930 Unspecified sequelae of cerebral infarction: Secondary | ICD-10-CM | POA: Diagnosis present

## 2018-03-12 DIAGNOSIS — R42 Dizziness and giddiness: Secondary | ICD-10-CM | POA: Diagnosis present

## 2018-03-12 DIAGNOSIS — I10 Essential (primary) hypertension: Secondary | ICD-10-CM | POA: Insufficient documentation

## 2018-03-12 DIAGNOSIS — I739 Peripheral vascular disease, unspecified: Secondary | ICD-10-CM | POA: Insufficient documentation

## 2018-03-12 DIAGNOSIS — I6329 Cerebral infarction due to unspecified occlusion or stenosis of other precerebral arteries: Secondary | ICD-10-CM

## 2018-03-12 HISTORY — DX: Cerebral infarction due to unspecified occlusion or stenosis of right posterior cerebral artery: I63.531

## 2018-03-12 LAB — COMPREHENSIVE METABOLIC PANEL
ALBUMIN: 4.3 g/dL (ref 3.5–5.0)
ALT: 15 U/L — ABNORMAL LOW (ref 17–63)
AST: 23 U/L (ref 15–41)
Alkaline Phosphatase: 63 U/L (ref 38–126)
Anion gap: 6 (ref 5–15)
BILIRUBIN TOTAL: 0.6 mg/dL (ref 0.3–1.2)
BUN: 13 mg/dL (ref 6–20)
CHLORIDE: 105 mmol/L (ref 101–111)
CO2: 27 mmol/L (ref 22–32)
Calcium: 9 mg/dL (ref 8.9–10.3)
Creatinine, Ser: 0.8 mg/dL (ref 0.61–1.24)
GFR calc Af Amer: >60 mL/min (ref >60)
GFR calc non Af Amer: >60 mL/min (ref >60)
GLUCOSE: 116 mg/dL — AB (ref 65–99)
POTASSIUM: 3.8 mmol/L (ref 3.5–5.1)
Sodium: 138 mmol/L (ref 135–145)
TOTAL PROTEIN: 8 g/dL (ref 6.5–8.1)

## 2018-03-12 LAB — CBC
HCT: 43.6 % (ref 40.0–52.0)
Hemoglobin: 14.9 g/dL (ref 13.0–18.0)
MCH: 31.3 pg (ref 26.0–34.0)
MCHC: 34.1 g/dL (ref 32.0–36.0)
MCV: 91.7 fL (ref 80.0–100.0)
Platelets: 195 10*3/uL (ref 150–440)
RBC: 4.75 MIL/uL (ref 4.40–5.90)
RDW: 14.3 % (ref 11.5–14.5)
WBC: 10.2 10*3/uL (ref 3.8–10.6)

## 2018-03-12 LAB — TROPONIN I: Troponin I: <0.03 ng/mL (ref <0.03)

## 2018-03-12 MED ORDER — CLONIDINE HCL 0.1 MG PO TABS
0.1000 mg | ORAL_TABLET | Freq: Two times a day (BID) | ORAL | Status: DC
  Administered 2018-03-13: 08:00:00 0.1 mg via ORAL
  Filled 2018-03-12: qty 1

## 2018-03-12 MED ORDER — ASPIRIN EC 81 MG PO TBEC
81.0000 mg | DELAYED_RELEASE_TABLET | Freq: Every day | ORAL | Status: DC
  Administered 2018-03-13 (×2): 81 mg via ORAL
  Filled 2018-03-12: qty 1

## 2018-03-12 MED ORDER — NICOTINE 21 MG/24HR TD PT24
21.0000 mg | MEDICATED_PATCH | Freq: Every day | TRANSDERMAL | Status: DC
  Administered 2018-03-12 – 2018-03-13 (×2): 21 mg via TRANSDERMAL
  Filled 2018-03-12 (×3): qty 1

## 2018-03-12 MED ORDER — ONDANSETRON HCL 4 MG PO TABS
4.0000 mg | ORAL_TABLET | Freq: Four times a day (QID) | ORAL | Status: DC | PRN
Start: 2018-03-12 — End: 2018-03-13

## 2018-03-12 MED ORDER — ATORVASTATIN CALCIUM 20 MG PO TABS
40.0000 mg | ORAL_TABLET | Freq: Every day | ORAL | Status: DC
  Administered 2018-03-12: 23:00:00 40 mg via ORAL
  Filled 2018-03-12: qty 2

## 2018-03-12 MED ORDER — TRAMADOL-ACETAMINOPHEN 37.5-325 MG PO TABS
1.0000 | ORAL_TABLET | Freq: Three times a day (TID) | ORAL | Status: DC | PRN
  Administered 2018-03-13 (×2): 1 via ORAL
  Filled 2018-03-12 (×2): qty 1

## 2018-03-12 MED ORDER — TIZANIDINE HCL 4 MG PO TABS
4.0000 mg | ORAL_TABLET | Freq: Three times a day (TID) | ORAL | Status: DC | PRN
  Administered 2018-03-13: 06:00:00 4 mg via ORAL
  Filled 2018-03-12: qty 1

## 2018-03-12 MED ORDER — ACETAMINOPHEN 650 MG RE SUPP: 650.0000 mg | Freq: Four times a day (QID) | RECTAL | Status: DC | PRN

## 2018-03-12 MED ORDER — MECLIZINE HCL 25 MG PO TABS
25.0000 mg | ORAL_TABLET | Freq: Three times a day (TID) | ORAL | Status: DC
  Administered 2018-03-13: 25 mg via ORAL
  Filled 2018-03-12 (×4): qty 1

## 2018-03-12 MED ORDER — ONDANSETRON HCL 4 MG/2ML IJ SOLN: 4.0000 mg | Freq: Four times a day (QID) | INTRAMUSCULAR | Status: DC | PRN

## 2018-03-12 MED ORDER — ENOXAPARIN SODIUM 40 MG/0.4ML ~~LOC~~ SOLN
40.0000 mg | SUBCUTANEOUS | Status: DC
  Administered 2018-03-12: 23:00:00 40 mg via SUBCUTANEOUS
  Filled 2018-03-12: qty 0.4

## 2018-03-12 MED ORDER — ACETAMINOPHEN 325 MG PO TABS: 650.0000 mg | ORAL_TABLET | Freq: Four times a day (QID) | ORAL | Status: DC | PRN

## 2018-03-12 MED ORDER — TRAZODONE HCL 50 MG PO TABS
150.0000 mg | ORAL_TABLET | Freq: Every day | ORAL | Status: DC
  Administered 2018-03-12: 23:00:00 150 mg via ORAL
  Filled 2018-03-12: qty 3

## 2018-03-12 MED ORDER — ASPIRIN 81 MG PO CHEW
324.0000 mg | CHEWABLE_TABLET | Freq: Once | ORAL | Status: AC
  Administered 2018-03-12: 324 mg via ORAL
  Filled 2018-03-12: qty 4

## 2018-03-12 MED ORDER — LORAZEPAM 0.5 MG PO TABS
0.5000 mg | ORAL_TABLET | Freq: Every day | ORAL | Status: DC | PRN
  Administered 2018-03-13: 06:00:00 0.5 mg via ORAL
  Filled 2018-03-12 (×2): qty 1

## 2018-03-12 NOTE — ED Notes (Signed)
Patient transported to MRI 

## 2018-03-12 NOTE — ED Notes (Signed)
MRI contacted Pt by phone to complete screening questions. Pt awaiting MRI

## 2018-03-12 NOTE — ED Triage Notes (Signed)
Pt to ED reporting dizziness for the past week. That was treated with with Antivert without relief or change in symptoms. Pt reports he has also has been having headaches and a loss of peripheral vision bilaterally for the past 4 days. Pt sent from PCP to have a MRI performed.   Dizziness reported but pt able to ambulate from wheelchair to bed without trouble. Pt verbalized, "I can walk as long as I can see where I am going"

## 2018-03-12 NOTE — H&P (Signed)
[facility] [place] NAME: [name]    MR#:  [id number]  DATE OF BIRTH:  [birth year]  DATE OF ADMISSION:  03/12/2018  PRIMARY CARE PHYSICIAN: Tracie Harrier, MD   REQUESTING/REFERRING PHYSICIAN: Dr. Delman Kitten  CHIEF COMPLAINT:   Chief Complaint  Patient presents with  . Dizziness  . Loss of Vision    HISTORY OF PRESENT ILLNESS:  [name]  is a 72 y.o. male with a known history of otitis C, hypertension, depression, tobacco abuse, anxiety, osteoarthritis who presents to the hospital due to dizziness, loss of vision on the right eye in the periphery over the past few days.  Patient was seen by his primary care physician who thought his symptoms were related to vertigo and was put on some meclizine but it did not improve his symptoms.  He called the primary care physician back who then ordered a MRI of his brain which was positive for an acute CVA.  He was sent over to the ER for further evaluation.  Hospitalist services were asked for admission.  Patient denies any headache, nausea, vomiting, focal weakness or numbness anywhere in his body.  He denies any other associated symptoms presently.  PAST MEDICAL HISTORY:   Past Medical History:  Diagnosis Date  . Anxiety   . Arthritis   . Bell's palsy    hx of, right side  . Depression    hx of depression  . Hepatitis    hx of hepatitis C  . Hypertension   . Kidney stone    hx of  . Substance abuse ([place])    heroin    PAST SURGICAL HISTORY:   Past Surgical History:  Procedure Laterality Date  . ANTERIOR LUMBAR FUSION  05/13/2012   Procedure: ANTERIOR LUMBAR FUSION 1 LEVEL;  Surgeon: Melina Schools, MD;  Location: [place];  Service: Orthopedics;  Laterality: N/A;  ALIF L5-S1  . CHOLECYSTECTOMY    . KIDNEY STONE SURGERY    . LUMBAR FUSION  05/13/2012    SOCIAL HISTORY:   Social History   Tobacco Use  . Smoking status: Current Every Day Smoker    Packs/day: 0.50    Years:  53.00    Pack years: 26.50    Types: Cigarettes  . Smokeless tobacco: Never Used  Substance Use Topics  . Alcohol use: Yes    Alcohol/week: 0.0 oz    FAMILY HISTORY:   Family History  Problem Relation Age of Onset  . Stroke Mother   . Lung cancer Father     DRUG ALLERGIES:  No Known Allergies  REVIEW OF SYSTEMS:   Review of Systems  Constitutional: Negative for fever and weight loss.  HENT: Negative for congestion, nosebleeds and tinnitus.   Eyes: Negative for blurred vision, double vision and redness.  Respiratory: Negative for cough, hemoptysis and shortness of breath.   Cardiovascular: Negative for chest pain, orthopnea, leg swelling and PND.  Gastrointestinal: Negative for abdominal pain, diarrhea, melena, nausea and vomiting.  Genitourinary: Negative for dysuria, hematuria and urgency.  Musculoskeletal: Negative for falls and joint pain.  Neurological: Positive for dizziness. Negative for tingling, sensory change, focal weakness, seizures, weakness and headaches.  Endo/Heme/Allergies: Negative for polydipsia. Does not bruise/bleed easily.  Psychiatric/Behavioral: Negative for depression and memory loss. The patient is not nervous/anxious.     MEDICATIONS AT HOME:   Prior to Admission medications   Medication Sig Start Date End Date Taking? Authorizing Provider  cloNIDine (CATAPRES) 0.1 MG  tablet Take 0.1 mg by mouth 2 (two) times daily.  12/30/16  Yes [provider]  LORazepam (ATIVAN) 0.5 MG tablet Take 0.5 mg by mouth daily as needed for anxiety. Reported on 02/26/2016   Yes [provider]  meclizine (ANTIVERT) 25 MG tablet Take 1 tablet by mouth 3 (three) times daily. 03/06/18  Yes [provider]  tiZANidine (ZANAFLEX) 4 MG tablet Take 4 mg by mouth every 8 (eight) hours as needed.  02/16/16  Yes [provider]  traZODone (DESYREL) 150 MG tablet Take 150 mg by mouth at bedtime.    Yes [provider]   traMADol-acetaminophen (ULTRACET) 37.5-325 MG tablet TAKE 1 TABLET EVERY 8 HOURS AS NEEDED 03/18/17   [provider]      VITAL SIGNS:  Blood pressure 134/78, temperature 97.8 F (36.6 C), resp. rate 11.  PHYSICAL EXAMINATION:  Physical Exam  GENERAL:  72 y.o.-year-old patient lying in the bed with no acute distress.  EYES: Pupils equal, round, reactive to light and accommodation. No scleral icterus. Extraocular muscles intact.  HEENT: Head atraumatic, normocephalic. Oropharynx and nasopharynx clear. No oropharyngeal erythema, moist oral mucosa  NECK:  Supple, no jugular venous distention. No thyroid enlargement, no tenderness.  LUNGS: Normal breath sounds bilaterally, no wheezing, rales, rhonchi. No use of accessory muscles of respiration.  CARDIOVASCULAR: S1, S2 RRR. No murmurs, rubs, gallops, clicks.  ABDOMEN: Soft, nontender, nondistended. Bowel sounds present. No organomegaly or mass.  EXTREMITIES: No pedal edema, cyanosis, or clubbing. + 2 pedal & radial pulses b/l.   NEUROLOGIC: Cranial nerves II through XII are intact. No focal Motor or sensory deficits appreciated b/l.  + right eye lateral visual field loss.   PSYCHIATRIC: The patient is alert and oriented x 3.  SKIN: No obvious rash, lesion, or ulcer.   LABORATORY PANEL:   CBC Recent Labs  Lab 03/12/18 1511  WBC 10.2  HGB 14.9  HCT 43.6  PLT 195   ------------------------------------------------------------------------------------------------------------------  Chemistries  Recent Labs  Lab 03/12/18 1511  NA 138  K 3.8  CL 105  CO2 27  GLUCOSE 116*  BUN 13  CREATININE 0.80  CALCIUM 9.0  AST 23  ALT 15*  ALKPHOS 63  BILITOT 0.6   ------------------------------------------------------------------------------------------------------------------  Cardiac Enzymes Recent Labs  Lab 03/12/18 1511  TROPONINI <0.03    ------------------------------------------------------------------------------------------------------------------  RADIOLOGY:  Mr Brain Wo Contrast  Result Date: 03/12/2018 CLINICAL DATA:  Dizziness EXAM: MRI HEAD WITHOUT CONTRAST TECHNIQUE: Multiplanar, multiecho pulse sequences of the brain and surrounding structures were obtained without intravenous contrast. COMPARISON:  None. FINDINGS: BRAIN: The midline structures are normal. Right PCA territory acute infarction. No hemorrhage or midline shift. Associated cytotoxic edema without significant mass effect. There is no mass lesion or other mass effect. There is no hydrocephalus, dural abnormality or extra-axial collection. Normal appearance of the white matter. No age-advanced or lobar predominant atrophy. No chronic microhemorrhage or superficial siderosis. VASCULAR: Major intracranial arterial and venous sinus flow voids are preserved. SKULL AND UPPER CERVICAL SPINE: The visualized skull base, calvarium, upper cervical spine and extracranial soft tissues are normal. SINUSES/ORBITS: No fluid levels or advanced mucosal thickening. No mastoid or middle ear effusion. The orbits are normal. IMPRESSION: Acute infarct of the right PCA territory without hemorrhage or significant mass effect. Electronically Signed   By: Ulyses Jarred M.D.   On: 03/12/2018 18:16     IMPRESSION AND PLAN:   72 year old male with past medical history of hypertension, tobacco abuse, history of hepatitis C,  polysubstance abuse, anxiety who presents to the hospital due to dizziness and right lateral visual field loss and noted to have an acute stroke on MRI.  1.  Acute CVA-patient is positive for an acute infarct of the right PCA territory.  This is likely the cause of patient's dizziness and right lateral visual field loss. - Patient's risk factors for stroke are ongoing tobacco abuse along with essential hypertension. - We will start the patient on aspirin, high-dose  intensity statin.  Check lipid profile. -We will get a neurology consult.  Check an echocardiogram, carotid duplex.  2.  Essential hypertension-continue clonidine.  3.  Anxiety-continue Ativan.  4.  History of chronic back pain-continue tizanidine, Ultracet.  5.  Tobacco abuse-place the patient on nicotine patch.    All the records are reviewed and case discussed with ED provider. Management plans discussed with the patient, family and they are in agreement.  CODE STATUS: Full code  TOTAL TIME TAKING CARE OF THIS PATIENT: 40 minutes.    Henreitta Leber M.D on 03/12/2018 at 7:23 PM  Between 7am to 6pm - Pager - [phone]  After 6pm go to [email or web address] - password [email or web address] [facility]  Office  [phone]  CC: Primary care physician; Tracie Harrier, MD

## 2018-03-12 NOTE — ED Provider Notes (Signed)
[facility] Emergency Department Provider Note   ____________________________________________   First MD Initiated Contact with Patient 03/12/18 1447     (approximate)  I have reviewed the triage vital signs and the nursing notes.   HISTORY  Chief Complaint Dizziness and Loss of Vision   HPI [name] is a 72 y.o. male presents for evaluation of feeling of slight dizziness for about 1 week as well as difficulty with his "peripheral vision".  Patient reports about a week ago he noticed that he felt like he could not see things too well, especially on the left side of him.  Also felt like his been feeling a little lightheaded at times or feeling a little off balance.  He denies any falls or injury.  Size primary care doctor about a week ago and was prescribed Antivert, but reports it has not really helped.  He saw his doctor again today and they recommended he come to make sure he did not have a stroke.  He has not had any changes in his vision.  Is 89 trouble walking except for his balance is off.  No weakness in his arms or legs or face.  No chest pain or trouble breathing.  Describes symptoms as seeming worse when he tries to look at things at the corner of his eye, but can see things in front of them clearly like the TV.    Past Medical History:  Diagnosis Date  . Anxiety   . Arthritis   . Bell's palsy    hx of, right side  . Depression    hx of depression  . Hepatitis    hx of hepatitis C  . Hypertension   . Kidney stone    hx of  . Substance abuse [facility])    heroin    Patient Active Problem List   Diagnosis Date Noted  . PAD (peripheral artery disease) ([place]) 04/20/2017  . Pain and swelling of lower extremity 01/14/2016  . Chronic hip pain (Right) 01/14/2016  . Osteoarthritis of hip (Right) 01/14/2016  . Generalized anxiety disorder 01/14/2016  . Depression with anxiety 01/14/2016  . Lumbar facet syndrome 01/14/2016  . Clinical  depression 01/11/2016  . BP (high blood pressure) 01/11/2016  . Insomnia, psychophysiological 01/11/2016  . Current tobacco use 01/11/2016  . Chronic low back pain (Location of Primary Source of Pain) ([place]) 01/11/2016  . Chronic pain 01/11/2016  . Lumbar spondylosis 01/11/2016  . Long term current use of opiate analgesic 01/11/2016  . Long term prescription opiate use 01/11/2016  . Opiate use (15 MME/Day) 01/11/2016  . Encounter for therapeutic drug level monitoring 01/11/2016  . Encounter for pain management planning 01/11/2016  . Failed back surgical syndrome (S/P anterior fusion at L5-S1) 01/11/2016  . Lumbar facet arthropathy 01/11/2016  . Lumbosacral radiculitis 08/16/2014  . Discogenic low back pain 04/27/2012    Past Surgical History:  Procedure Laterality Date  . ANTERIOR LUMBAR FUSION  05/13/2012   Procedure: ANTERIOR LUMBAR FUSION 1 LEVEL;  Surgeon: Melina Schools, MD;  Location: [place];  Service: Orthopedics;  Laterality: N/A;  ALIF L5-S1  . CHOLECYSTECTOMY    . KIDNEY STONE SURGERY    . LUMBAR FUSION  05/13/2012    Prior to Admission medications   Medication Sig Start Date End Date Taking? Authorizing Provider  amitriptyline (ELAVIL) 25 MG tablet TK 1 T PO NIGHTLY 03/26/17   [provider]  cloNIDine (CATAPRES) 0.1 MG tablet  12/30/16   [provider]  furosemide (LASIX) 20 MG tablet  01/20/17   [provider]  gabapentin (NEURONTIN) 300 MG capsule Take by mouth. 01/20/17 01/20/18  [provider]  LORazepam (ATIVAN) 0.5 MG tablet Take 0.5 mg by mouth daily as needed for anxiety. Reported on 02/26/2016    [provider]  mirtazapine (REMERON) 15 MG tablet Take by mouth. 01/29/17 01/29/18  [provider]  nortriptyline (PAMELOR) 10 MG capsule Take by mouth. 04/01/17 04/01/18  [provider]  tiZANidine (ZANAFLEX) 4 MG tablet Take by mouth. 02/16/16   [provider]  traMADol-acetaminophen (ULTRACET)  37.5-325 MG tablet TK 1 T PO Q 8 H PRN P 03/18/17   [provider]  traZODone (DESYREL) 100 MG tablet Take 100 mg by mouth at bedtime.    [provider]    Allergies Patient has no known allergies.  History reviewed. No pertinent family history.  Social History Social History   Tobacco Use  . Smoking status: Current Every Day Smoker    Packs/day: 0.50    Years: 53.00    Pack years: 26.50    Types: Cigarettes  . Smokeless tobacco: Never Used  Substance Use Topics  . Alcohol use: Yes    Alcohol/week: 0.0 oz  . Drug use: No    Types: Heroin, Cocaine, Marijuana    Review of Systems Constitutional: No fever/chills Eyes: See HPI ENT: No sore throat. Cardiovascular: Denies chest pain. Respiratory: Denies shortness of breath. Gastrointestinal: No abdominal pain.  No nausea, no vomiting.  No diarrhea.  No constipation. Genitourinary: Negative for dysuria. Musculoskeletal: Negative for back pain. Skin: Negative for rash. Neurological: Negative for headaches, focal weakness or numbness.    ____________________________________________   PHYSICAL EXAM:  VITAL SIGNS: ED Triage Vitals  Enc Vitals Group     BP 03/12/18 1451 112/74     Pulse --      Resp 03/12/18 1451 16     Temp 03/12/18 1457 97.8 F (36.6 C)     Temp src --      SpO2 --      Weight --      Height --      Head Circumference --      Peak Flow --      Pain Score 03/12/18 1451 0     Pain Loc --      Pain Edu? --      Excl. in [place]? --     Constitutional: Alert and oriented. Well appearing and in no acute distress. Eyes: Conjunctivae are normal.  Vision, right-sided extraocular fields appear normal, appears the patient may have a  left-sided hemianopsia homonymous. Head: Atraumatic. Nose: No congestion/rhinnorhea. Mouth/Throat: Mucous membranes are moist. Neck: No stridor.   Cardiovascular: Normal rate, regular rhythm. Grossly normal heart sounds.  Good peripheral  circulation. Respiratory: Normal respiratory effort.  No retractions. Lungs CTAB. Gastrointestinal: Soft and nontender. No distention. Musculoskeletal: No lower extremity tenderness nor edema. Neurologic:  Normal speech and language. No gross focal neurologic deficits are appreciated.  Skin:  Skin is warm, dry and intact. No rash noted. Psychiatric: Mood and affect are normal. Speech and behavior are normal.  ____________________________________________   LABS (all labs ordered are listed, but only abnormal results are displayed)  Labs Reviewed  COMPREHENSIVE METABOLIC PANEL - Abnormal; Notable for the following components:      Result Value   Glucose, Bld 116 (*)    ALT 15 (*)    All other components within normal limits  CBC  TROPONIN I   ____________________________________________  EKG  Reviewed and entered by me at 1500 Heart rate 60 QRS 99 QTc 420 Normal sinus rhythm, no evidence of ischemia or ectopy ____________________________________________  RADIOLOGY MRI brain reviewed, positive for ischemic stroke involving the right posterior communicating artery territory ____________________________________________   PROCEDURES  Procedure(s) performed: None  Procedures  Critical Care performed: Yes, see critical care note(s)  CRITICAL CARE Performed by: Delman Kitten   Total critical care time: 35 minutes  Critical care time was exclusive of separately billable procedures and treating other patients.  Critical care was necessary to treat or prevent imminent or life-threatening deterioration.  Critical care was time spent personally by me on the following activities: development of treatment plan with patient and/or surrogate as well as nursing, discussions with consultants, evaluation of patient's response to treatment, examination of patient, obtaining history from patient or surrogate, ordering and performing treatments and interventions, ordering and review of  laboratory studies, ordering and review of radiographic studies, pulse oximetry and re-evaluation of patient's condition.  Patient evidence of acute stroke, presenting with primarily balance issues and loss of left-sided peripheral vision.  Patient will require admission, treatment salicylates.  He is well outside the TPA window with symptoms starting a few days ago.  Negative for evidence of large vessel occlusion by exam ____________________________________________   INITIAL IMPRESSION / ASSESSMENT AND PLAN / ED COURSE  Pertinent labs & imaging results that were available during my care of the patient were reviewed by me and considered in my medical decision making (see chart for details).  Patient presents for evaluation of intermittent dizziness for about a week, but on exam today seems to have visual changes including what appears to be a left-sided homonymous hemianopsia.  This raises my concern for possible central process, somewhat unclear but could certainly represent a small stroke.  Ordered MRI, discussed case with Dr. Doy Mince who recommends MRI for further evaluation.  Patient agreeable with plan    Clinical Course as of Mar 12 1848  Thu Mar 12, 2018  1834 VAN negative. Positive stroke.    [MQ]    Clinical Course User Index [MQ] Delman Kitten, MD   Discussed case with Dr. Lucky Rathke, on-call for [facility] health.  He advises admission, aspirin for now, further inpatient work-up and neurology consultation tomorrow.  Patient agreeable with plan, discussed case with Dr. Verdell Carmine will admit for hospitalist service and further stroke work-up  ____________________________________________   FINAL CLINICAL IMPRESSION(S) / ED DIAGNOSES  Final diagnoses:  Cerebrovascular accident (CVA) due to occlusion of right posterior communicating artery ([place])      NEW MEDICATIONS STARTED DURING THIS VISIT:  New Prescriptions   No medications on file     Note:  This document was prepared using  Dragon voice recognition software and may include unintentional dictation errors.     Delman Kitten, MD 03/12/18 1850

## 2018-03-12 NOTE — ED Notes (Addendum)
Attempted to call report, nurse unavailable.

## 2018-03-13 ENCOUNTER — Observation Stay: Payer: Medicare HMO

## 2018-03-13 ENCOUNTER — Observation Stay (HOSPITAL_BASED_OUTPATIENT_CLINIC_OR_DEPARTMENT_OTHER)
Admit: 2018-03-13 | Discharge: 2018-03-13 | Disposition: A | Discharge status: Home / Self Care | Payer: Medicare HMO | Attending: Specialist | Admitting: Specialist

## 2018-03-13 DIAGNOSIS — K119 Disease of salivary gland, unspecified: Secondary | ICD-10-CM

## 2018-03-13 DIAGNOSIS — I6329 Cerebral infarction due to unspecified occlusion or stenosis of other precerebral arteries: Secondary | ICD-10-CM

## 2018-03-13 DIAGNOSIS — I340 Nonrheumatic mitral (valve) insufficiency: Secondary | ICD-10-CM

## 2018-03-13 DIAGNOSIS — I672 Cerebral atherosclerosis: Secondary | ICD-10-CM

## 2018-03-13 HISTORY — DX: Cerebral atherosclerosis: I67.2

## 2018-03-13 HISTORY — DX: Disease of salivary gland, unspecified: K11.9

## 2018-03-13 LAB — LIPID PANEL
Cholesterol: 200 mg/dL (ref 0–200)
HDL: 20 mg/dL — ABNORMAL LOW (ref >40)
LDL Cholesterol: 131 mg/dL — ABNORMAL HIGH (ref 0–99)
Total CHOL/HDL Ratio: 10 RATIO
Triglycerides: 247 mg/dL — ABNORMAL HIGH (ref <150)
VLDL: 49 mg/dL — ABNORMAL HIGH (ref 0–40)

## 2018-03-13 LAB — ECHOCARDIOGRAM COMPLETE
Height: 62 in
WEIGHTICAEL: 2416 [oz_av]

## 2018-03-13 MED ORDER — ASPIRIN EC 81 MG PO TBEC: 81.0000 mg | DELAYED_RELEASE_TABLET | Freq: Every day | ORAL | 0 refills | Status: DC

## 2018-03-13 MED ORDER — PERFLUTREN LIPID MICROSPHERE
1.0000 mL | INTRAVENOUS | Status: AC | PRN
Start: 2018-03-13 — End: 2018-03-13
  Filled 2018-03-13: qty 10

## 2018-03-13 MED ORDER — STROKE: EARLY STAGES OF RECOVERY BOOK
Freq: Once | Status: AC
  Administered 2018-03-13: 04:00:00

## 2018-03-13 MED ORDER — ATORVASTATIN CALCIUM 40 MG PO TABS: 40.0000 mg | ORAL_TABLET | Freq: Every day | ORAL | 0 refills | Status: DC

## 2018-03-13 MED ORDER — IOPAMIDOL (ISOVUE-370) INJECTION 76%
75.0000 mL | Freq: Once | INTRAVENOUS | Status: AC | PRN
  Administered 2018-03-13: 11:00:00 75 mL via INTRAVENOUS

## 2018-03-13 MED ORDER — NICOTINE 14 MG/24HR TD PT24: 14.0000 mg | MEDICATED_PATCH | Freq: Every day | TRANSDERMAL | 0 refills | Status: AC

## 2018-03-13 MED ORDER — CLOPIDOGREL BISULFATE 75 MG PO TABS: 75.0000 mg | ORAL_TABLET | Freq: Every day | ORAL | 0 refills | Status: DC

## 2018-03-13 MED ORDER — CLOPIDOGREL BISULFATE 75 MG PO TABS
75.0000 mg | ORAL_TABLET | Freq: Every day | ORAL | Status: DC
  Administered 2018-03-13: 16:00:00 75 mg via ORAL
  Filled 2018-03-13: qty 1

## 2018-03-13 NOTE — Discharge Instructions (Signed)
Heart healthy diet. °Activity as tolerated. °

## 2018-03-13 NOTE — Progress Notes (Signed)
PT Cancellation Note  Patient Details Name: KIRKE BREACH MRN: [id number] DOB: 10-17-1946   Cancelled Treatment:    Reason Eval/Treat Not Completed: Patient at procedure or test/unavailable.  PT consult received.  Chart reviewed.  Pt currently off floor for testing.  Will re-attempt PT evaluation at a later date/time as medically appropriate.  Leitha Bleak, PT 03/13/18, 11:15 AM [phone]

## 2018-03-13 NOTE — Care Management Note (Signed)
Case Management Note  Patient Details  Name: [name] MRN: [id number] Date of Birth: 18-Jun-1946  Subjective/Objective:  Admitted to [facility] under observation status with the diagnosis of CVA., Lives with friend, Pamala Hurry [phone]). Seen Dr. Ginette Pitman yesterday. Prescriptions [facility]. Home Health per [facility] 04/2012. No skilled nursing. No home oxygen. No medical equipment in the home. Takes care of all basic activities of daily living himself. Last fall was 2 months ago. Lost 2 pounds, but in general has a good appetite.                  Action/Plan: Will continue for discharge plans   Expected Discharge Date:                  Expected Discharge Plan:     In-House Referral:     Discharge planning Services     Post Acute Care Choice:    Choice offered to:     DME Arranged:    DME Agency:     HH Arranged:    HH Agency:     Status of Service:     If discussed at [employer] of Stay Meetings, dates discussed:    Additional Comments:  Shelbie Ammons, RN MSN CCM Care Management [phone] 03/13/2018, 10:21 AM

## 2018-03-13 NOTE — Consult Note (Signed)
Reason for Consult:L eye vision loss  Referring Physician: Dr. Darvin Neighbours   CC: L eye vision loss   HPI: [name] is an 72 y.o. male with a known history of otitis C, hypertension, depression, tobacco abuse, anxiety, osteoarthritis who presents to the hospital due to dizziness, loss of vision on the L eye in the periphery over the past few days.  Pt has R PCA infarct.     Past Medical History:  Diagnosis Date  . Anxiety   . Arthritis   . Bell's palsy    hx of, right side  . Depression    hx of depression  . Hepatitis    hx of hepatitis C  . Hypertension   . Kidney stone    hx of  . Substance abuse [facility])    heroin    Past Surgical History:  Procedure Laterality Date  . ANTERIOR LUMBAR FUSION  05/13/2012   Procedure: ANTERIOR LUMBAR FUSION 1 LEVEL;  Surgeon: Melina Schools, MD;  Location: [place];  Service: Orthopedics;  Laterality: N/A;  ALIF L5-S1  . CHOLECYSTECTOMY    . KIDNEY STONE SURGERY    . LUMBAR FUSION  05/13/2012    Family History  Problem Relation Age of Onset  . Stroke Mother   . Lung cancer Father     Social History:  reports that he has been smoking cigarettes.  He has a 26.50 pack-year smoking history. He has never used smokeless tobacco. He reports that he drank alcohol. He reports that he does not use drugs.  No Known Allergies  Medications: I have reviewed the patient's current medications.  ROS: History obtained from the patient  General ROS: negative for - chills, fatigue, fever, night sweats, weight gain or weight loss Psychological ROS: negative for - behavioral disorder, hallucinations, memory difficulties, mood swings or suicidal ideation Ophthalmic ROS: negative for - blurry vision, double vision, eye pain or loss of vision ENT ROS: negative for - epistaxis, nasal discharge, oral lesions, sore throat, tinnitus or vertigo Allergy and Immunology ROS: negative for - hives or itchy/watery eyes Hematological and Lymphatic ROS: negative for -  bleeding problems, bruising or swollen lymph nodes Endocrine ROS: negative for - galactorrhea, hair pattern changes, polydipsia/polyuria or temperature intolerance Respiratory ROS: negative for - cough, hemoptysis, shortness of breath or wheezing Cardiovascular ROS: negative for - chest pain, dyspnea on exertion, edema or irregular heartbeat Gastrointestinal ROS: negative for - abdominal pain, diarrhea, hematemesis, nausea/vomiting or stool incontinence Genito-Urinary ROS: negative for - dysuria, hematuria, incontinence or urinary frequency/urgency Musculoskeletal ROS: negative for - joint swelling or muscular weakness Neurological ROS: as noted in HPI Dermatological ROS: negative for rash and skin lesion changes  Physical Examination: Blood pressure (!) 141/80, pulse 68, temperature 98 F (36.7 C), temperature source Oral, resp. rate 15, height 5\' 2"  (1.575 m), weight 151 lb (68.5 kg), SpO2 97 %.    Neurological Examination   Mental Status: Alert, oriented, thought content appropriate.  Speech fluent without evidence of aphasia.  Able to follow 3 step commands without difficulty. Cranial Nerves: II: Discs flat bilaterally; Loss of vision on L side.  III,IV, VI: ptosis not present, extra-ocular motions intact bilaterally V,VII: smile symmetric, facial light touch sensation normal bilaterally VIII: hearing normal bilaterally IX,X: gag reflex present XI: bilateral shoulder shrug XII: midline tongue extension Motor: Right : Upper extremity   5/5    Left:     Upper extremity   5/5  Lower extremity   5/5  Lower extremity   5/5 Tone and bulk:normal tone throughout; no atrophy noted Sensory: Pinprick and light touch intact throughout, bilaterally Deep Tendon Reflexes: 1+ and symmetric throughout Plantars: Right: downgoing   Left: downgoing Cerebellar: normal finger-to-nose, normal rapid alternating movements and normal heel-to-shin test Gait: not tested      Laboratory Studies:    Basic Metabolic Panel: Recent Labs  Lab 03/12/18 1511  NA 138  K 3.8  CL 105  CO2 27  GLUCOSE 116*  BUN 13  CREATININE 0.80  CALCIUM 9.0    Liver Function Tests: Recent Labs  Lab 03/12/18 1511  AST 23  ALT 15*  ALKPHOS 63  BILITOT 0.6  PROT 8.0  ALBUMIN 4.3   No results for input(s): LIPASE, AMYLASE in the last 168 hours. No results for input(s): AMMONIA in the last 168 hours.  CBC: Recent Labs  Lab 03/12/18 1511  WBC 10.2  HGB 14.9  HCT 43.6  MCV 91.7  PLT 195    Cardiac Enzymes: Recent Labs  Lab 03/12/18 1511  TROPONINI <0.03    BNP: Invalid input(s): POCBNP  CBG: No results for input(s): GLUCAP in the last 168 hours.  Microbiology: Results for orders placed or performed during the hospital encounter of 05/07/12  Surgical pcr screen     Status: None   Collection Time: 05/07/12  9:54 AM  Result Value Ref Range Status   MRSA, PCR NEGATIVE NEGATIVE Final   Staphylococcus aureus NEGATIVE NEGATIVE Final    Comment:        The Xpert SA Assay (FDA approved for NASAL specimens only), is one component of a comprehensive surveillance program.  It is not intended to diagnose infection nor to guide or monitor treatment.    Coagulation Studies: No results for input(s): LABPROT, INR in the last 72 hours.  Urinalysis: No results for input(s): COLORURINE, LABSPEC, PHURINE, GLUCOSEU, HGBUR, BILIRUBINUR, KETONESUR, PROTEINUR, UROBILINOGEN, NITRITE, LEUKOCYTESUR in the last 168 hours.  Invalid input(s): APPERANCEUR  Lipid Panel:     Component Value Date/Time   CHOL 200 03/13/2018 0702   TRIG 247 (H) 03/13/2018 0702   HDL 20 (L) 03/13/2018 0702   CHOLHDL 10.0 03/13/2018 0702   VLDL 49 (H) 03/13/2018 0702   LDLCALC 131 (H) 03/13/2018 0702    HgbA1C: No results found for: HGBA1C  Urine Drug Screen:      Component Value Date/Time   LABOPIA NONE DETECTED 07/28/2012 1427   COCAINSCRNUR NONE DETECTED 07/28/2012 1427   LABBENZ POSITIVE (A)  07/28/2012 1427   AMPHETMU NONE DETECTED 07/28/2012 1427   THCU NONE DETECTED 07/28/2012 1427   LABBARB NONE DETECTED 07/28/2012 1427    Alcohol Level: No results for input(s): ETH in the last 168 hours.  Other results: EKG: normal EKG, normal sinus rhythm, unchanged from previous tracings.  Imaging: Mr Brain Wo Contrast  Result Date: 03/12/2018 CLINICAL DATA:  Dizziness EXAM: MRI HEAD WITHOUT CONTRAST TECHNIQUE: Multiplanar, multiecho pulse sequences of the brain and surrounding structures were obtained without intravenous contrast. COMPARISON:  None. FINDINGS: BRAIN: The midline structures are normal. Right PCA territory acute infarction. No hemorrhage or midline shift. Associated cytotoxic edema without significant mass effect. There is no mass lesion or other mass effect. There is no hydrocephalus, dural abnormality or extra-axial collection. Normal appearance of the white matter. No age-advanced or lobar predominant atrophy. No chronic microhemorrhage or superficial siderosis. VASCULAR: Major intracranial arterial and venous sinus flow voids are preserved. SKULL AND UPPER CERVICAL SPINE: The visualized skull base, calvarium, upper  cervical spine and extracranial soft tissues are normal. SINUSES/ORBITS: No fluid levels or advanced mucosal thickening. No mastoid or middle ear effusion. The orbits are normal. IMPRESSION: Acute infarct of the right PCA territory without hemorrhage or significant mass effect. Electronically Signed   By: Ulyses Jarred M.D.   On: 03/12/2018 18:16     Assessment/Plan:  72 y.o. male with a known history of otitis C, hypertension, depression, tobacco abuse, anxiety, osteoarthritis who presents to the hospital due to dizziness, loss of vision on the L eye in the periphery over the past few days.  Pt has R PCA infarct.    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  - Needs CTA h/n to see if any posterior circulation stenosis as this  is posterior circulation stroke - pt/ot - no driving as unable to see in L side periphery  - Will determine if there is a need for dual anti platelet therapy based on CTA  Junia Nygren   03/13/2018, 10:06 AM

## 2018-03-13 NOTE — Care Management (Signed)
Physical therapy evaluation completed. No follow-up recommendations. Occupational therapy evaluation completed. Outpatient therapy recommended. Referral signed per Dr. Darvin Neighbours. Faxed to Rehab department Discharge to home today per Dr. Darvin Neighbours. Shelbie Ammons RN MSN CCM Care management [phone]

## 2018-03-13 NOTE — Evaluation (Addendum)
Occupational Therapy Evaluation Patient Details Name: [name] MRN: [id number] DOB: 02/07/1946 Today's Date: 03/13/2018    History of Present Illness Pt is a 72 y.o. male presenting to hospital 03/12/18 with dizziness x1 week and loss of peripheral vision.  Imaging showing acute infarct R PCA territory.  Pt admitted with acute CVA (L homonomous hemianopsia).  PMH includes Hepatitis C, Bell's palsy, depression, htn, substance abuse, chronic R hip and low back pain.   Clinical Impression   Pt seen for OT evaluation this date. Pt is retired and lives with his spouse (also retired) in a 1 story home with level entry. Pt was independent with mobility, ADL, and IADL prior to admission with 1 fall reported in past 12 months (LOB during car transfer, scraped his knee). Pt currently presents with L visual field deficits in B eyes (<50% vision loss on L side in B eyes) which impacts his safety during functional mobility and ability to perform ADL and IADL tasks at [facility]. Pt/family educated in compensatory strategies for homonymous hemianopsia including visual tracking, visual scanning exercises, safety awareness, falls prevention, navigating the environment, strategies for reading, and community mobility. Educated in ability of OT driving rehab specialists to support safe return to driving if/when appropriate, as driving is very important to the patient. Pt verbalized understanding. Pt at modified level for mobility during assessment, demonstrating good safety awareness and use of compensatory strategies including head turns and increased awareness of L side to maximize safety. Strength and ROM WNL, intact sensation, proprioception, and coordination, with no neglect to L side upon assessment. Baseline independence with ADL tasks. Recommend OP OT services upon discharge which is anticipated this afternoon. Notified RNCM. No additional acute skilled OT needs at this time; all additional OT needs can be met at next  venue of care. Will sign off. Please re-consult if additional needs arise.    Follow Up Recommendations  Outpatient OT    Equipment Recommendations  None recommended by OT    Recommendations for Other Services       Precautions / Restrictions Precautions Precautions: Fall Restrictions Weight Bearing Restrictions: No      Mobility Bed Mobility Overal bed mobility: Independent               Transfers Overall transfer level: Independent Equipment used: None             General transfer comment: steady safe transfers    Balance Overall balance assessment: Independent Sitting-balance support: No upper extremity supported;Feet supported Sitting balance-Leahy Scale: Normal    Standing balance support: No upper extremity supported Standing balance-Leahy Scale: Normal                           ADL either performed or assessed with clinical judgement   ADL Overall ADL's : Modified independent                                       General ADL Comments: Pt able to perform ADL tasks with modified independence, compensating for [facility] visual deficits     Vision Baseline Vision/History: Wears glasses Wears Glasses: At all times Patient Visual Report: Peripheral vision impairment;Blurring of vision Vision Assessment?: Yes Eye Alignment: Within Functional Limits Ocular Range of Motion: Within Functional Limits Alignment/Gaze Preference: Within Defined Limits Tracking/Visual Pursuits: Able to track stimulus in all quads without difficulty Convergence: Within  functional limits Visual Fields: Left homonymous hemianopsia Additional Comments: at least 60% of full visual field intact     Perception     Praxis      Pertinent Vitals/Pain Pain Assessment: Faces Faces Pain Scale: Hurts a little bit Pain Location: chronic low back pain Pain Descriptors / Indicators: Grimacing;Aching Pain Intervention(s): Monitored during session;Limited  activity within patient's tolerance     Hand Dominance     Extremity/Trunk Assessment Upper Extremity Assessment Upper Extremity Assessment: Overall WFL for tasks assessed(grossly 4+/5 bilaterally, intact sensation, coordination, proprioception)   Lower Extremity Assessment Lower Extremity Assessment: Defer to PT evaluation;Overall WFL for tasks assessed(B LE strength at least 4+/5 hip flexion, knee flexion/extension, and DF; intact B LE sensation, tone, coordination, and proprioception)   Cervical / Trunk Assessment Cervical / Trunk Assessment: Normal   Communication Communication Communication: No difficulties   Cognition Arousal/Alertness: Awake/alert Behavior During Therapy: WFL for tasks assessed/performed Overall Cognitive Status: Within Functional Limits for tasks assessed                                     General Comments       Exercises Other Exercises Other Exercises: Pt instructed in compensatory strategies for L peripheral visual deficits to maximize safety and functional independence.  Other Exercises: Educated pt in ability of OT to address visual deficits including OT driving rehab specialists to support his return to driving if/when appropriate   Shoulder Instructions      Home Living Family/patient expects to be discharged to:: Private residence Living Arrangements: Spouse/significant other Available Help at Discharge: Family;Available 24 hours/day Type of Home: House Home Access: Level entry     Home Layout: One level     Bathroom Shower/Tub: Teacher, early years/pre: Handicapped height     Home Equipment: None          Prior Functioning/Environment Level of Independence: Independent        Comments: Retired.  Reports no falls in past 6 months.  Independent with ADL's.        OT Problem List: Impaired vision/perception      OT Treatment/Interventions: Self-care/ADL training;Neuromuscular  education;Visual/perceptual remediation/compensation;Therapeutic activities;Patient/family education    OT Goals(Current goals can be found in the care plan section) Acute Rehab OT Goals Patient Stated Goal: to go home OT Goal Formulation: With patient Time For Goal Achievement: 03/27/18 Potential to Achieve Goals: Good  OT Frequency: Min 1X/week   Barriers to D/C:            Co-evaluation              AM-PAC PT "6 Clicks" Daily Activity     Outcome Measure Help from another person eating meals?: None Help from another person taking care of personal grooming?: None Help from another person toileting, which includes using toliet, bedpan, or urinal?: None Help from another person bathing (including washing, rinsing, drying)?: None Help from another person to put on and taking off regular upper body clothing?: None Help from another person to put on and taking off regular lower body clothing?: None 6 Click Score: 24   End of Session    Activity Tolerance: Patient tolerated treatment well Patient left: in bed;with call bell/phone within reach  OT Visit Diagnosis: Other abnormalities of gait and mobility (R26.89);Low vision, both eyes (H54.2)  Time: 3007-6226 OT Time Calculation (min): 16 min Charges:  OT General Charges $OT Visit: 1 Visit OT Evaluation $OT Eval Low Complexity: 1 Low  Jeni Salles, MPH, MS, OTR/L ascom [phone] 03/13/18, 3:18 PM

## 2018-03-13 NOTE — Progress Notes (Signed)
Discharge teaching given to patient, patient verbalized understanding and had no questions. Patient IV removed. Patient will be transported home by family. All patient belongings gathered prior to leaving.  

## 2018-03-13 NOTE — Progress Notes (Signed)
*  PRELIMINARY RESULTS* Echocardiogram 2D Echocardiogram has been performed.  Sherrie Sport 03/13/2018, 9:54 AM

## 2018-03-13 NOTE — Care Management Obs Status (Signed)
[place] NOTIFICATION   Patient Details  Name: CYNCERE SONTAG MRN: [id number] Date of Birth: Sep 15, 1946   Medicare Observation Status Notification Given:  Yes    Shelbie Ammons, RN 03/13/2018, 10:15 AM

## 2018-03-13 NOTE — Evaluation (Signed)
Physical Therapy Evaluation Patient Details Name: [name] MRN: [id number] DOB: 11/26/1945 Today's Date: 03/13/2018   History of Present Illness  Pt is a 72 y.o. male presenting to hospital 03/12/18 with dizziness x1 week and loss of peripheral vision.  Imaging showing acute infarct R PCA territory.  Pt admitted with acute CVA (L homonomous hemianopsia).  PMH includes Hepatitis C, Bell's palsy, depression, htn, substance abuse, chronic R hip and low back pain.  Clinical Impression  Pt demonstrates steady ambulation (no assistive device needed) and is able to easily compensate (without any cueing) to navigate around obstacles on L side.  No loss of balance during session with functional mobility or static/dynamic standing activities.  Decreased peripheral vision noted on L compared to R and pt reporting increased blurriness and decreased peripheral vision gradually since this morning (same symptoms as upon hospital admission but not any worse symptoms than when he came to hospital); nursing notified.  OT consulted (see OT note for further visual testing).  No PT needs identified in hospital or upon hospital discharge (CM notified).  Will discharge pt from PT in house.  Please re-consult PT if pt's status changes and acute PT needs are identified.    Follow Up Recommendations No PT follow up    Equipment Recommendations  None recommended by PT    Recommendations for Other Services OT consult     Precautions / Restrictions Precautions Precautions: Fall Restrictions Weight Bearing Restrictions: No      Mobility  Bed Mobility Overal bed mobility: Independent             General bed mobility comments: Supine to sit no difficulties noted  Transfers Overall transfer level: Independent Equipment used: None             General transfer comment: steady safe transfers  Ambulation/Gait Ambulation/Gait assistance: Modified independent (Device/Increase time) Ambulation Distance  (Feet): 120 Feet Assistive device: None Gait Pattern/deviations: WFL(Within Functional Limits) Gait velocity: appropriate cadence noted   General Gait Details: steady safe gait pattern; pt able to turn head to L (compensate) to navigate around obstacles on L side without any cueing.  Stairs Stairs: (Deferred d/t pt does not have stairs at home)          Wheelchair Mobility    Modified Rankin (Stroke Patients Only)       Balance Overall balance assessment: Independent Sitting-balance support: No upper extremity supported;Feet supported Sitting balance-Leahy Scale: Normal Sitting balance - Comments: steady sitting reaching outside BOS   Standing balance support: No upper extremity supported Standing balance-Leahy Scale: Normal Standing balance comment: steady standing reaching outside BOS B               High Level Balance Comments: Steady with standing static balance with feet shoulder width apart eyes opened; feet together eyes opened; and feet together with eyes closed.             Pertinent Vitals/Pain Pain Assessment: Faces Faces Pain Scale: Hurts little more Pain Location: chronic low back pain Pain Descriptors / Indicators: Grimacing Pain Intervention(s): Limited activity within patient's tolerance;Monitored during session;Repositioned;RN gave pain meds during session    [place] expects to be discharged to:: Private residence Living Arrangements: Spouse/significant other Available Help at Discharge: Family Type of Home: House Home Access: Level entry     Home Layout: One level Home Equipment: None      Prior Function Level of Independence: Independent         Comments: Retired.  Reports no falls in past 6 months.  Independent with ADL's.     Hand Dominance        Extremity/Trunk Assessment   Upper Extremity Assessment Upper Extremity Assessment: Defer to OT evaluation    Lower Extremity Assessment Lower  Extremity Assessment: (B LE strength at least 4+/5 hip flexion, knee flexion/extension, and DF; intact B LE sensation, tone, coordination, and proprioception)    Cervical / Trunk Assessment Cervical / Trunk Assessment: Normal  Communication   Communication: No difficulties  Cognition Arousal/Alertness: Awake/alert Behavior During Therapy: WFL for tasks assessed/performed Overall Cognitive Status: Within Functional Limits for tasks assessed                                        General Comments   Nursing cleared pt for participation in physical therapy.  Pt agreeable to PT session.    Exercises     Assessment/Plan    PT Assessment Patent does not need any further PT services  PT Problem List         PT Treatment Interventions      PT Goals (Current goals can be found in the Care Plan section)  Acute Rehab PT Goals Patient Stated Goal: to go home PT Goal Formulation: With patient Time For Goal Achievement: 03/27/18 Potential to Achieve Goals: Good    Frequency     Barriers to discharge        Co-evaluation               AM-PAC PT "6 Clicks" Daily Activity  Outcome Measure Difficulty turning over in bed (including adjusting bedclothes, sheets and blankets)?: None Difficulty moving from lying on back to sitting on the side of the bed? : None Difficulty sitting down on and standing up from a chair with arms (e.g., wheelchair, bedside commode, etc,.)?: None Help needed moving to and from a bed to chair (including a wheelchair)?: None Help needed walking in hospital room?: None Help needed climbing 3-5 steps with a railing? : None 6 Click Score: 24    End of Session Equipment Utilized During Treatment: Gait belt Activity Tolerance: Patient tolerated treatment well Patient left: in bed;with call bell/phone within reach Nurse Communication: Mobility status;Precautions;Other (comment)(Pt reporting vision gradually worsening during the day) PT  Visit Diagnosis: Other abnormalities of gait and mobility (R26.89)    Time: 4132-4401 PT Time Calculation (min) (ACUTE ONLY): 35 min   Charges:   PT Evaluation $PT Eval Low Complexity: 1 Low     PT G CodesLeitha Bleak, PT 03/13/18, 3:05 PM [phone]

## 2018-03-14 LAB — HIV ANTIBODY (ROUTINE TESTING W REFLEX): HIV Screen 4th Generation wRfx: NONREACTIVE

## 2018-03-20 ENCOUNTER — Ambulatory Visit: Payer: Medicare HMO

## 2018-04-01 NOTE — Discharge Summary (Signed)
[facility] NAME: [name]    MR#:  [id number]  DATE OF BIRTH:  Jul 27, 1946  DATE OF ADMISSION:  03/12/2018 ADMITTING PHYSICIAN: Henreitta Leber, MD  DATE OF DISCHARGE: 03/13/2018  6:05 PM  PRIMARY CARE PHYSICIAN: Tracie Harrier, MD   ADMISSION DIAGNOSIS:  Cerebrovascular accident (CVA) due to occlusion of right posterior communicating artery ([place]) [I63.29]  DISCHARGE DIAGNOSIS:  Active Problems:   CVA (cerebral vascular accident) ([place])   SECONDARY DIAGNOSIS:   Past Medical History:  Diagnosis Date  . Anxiety   . Arthritis   . Bell's palsy    hx of, right side  . Depression    hx of depression  . Hepatitis    hx of hepatitis C  . Hypertension   . Kidney stone    hx of  . Substance abuse ([place])    heroin     ADMITTING HISTORY   HISTORY OF PRESENT ILLNESS:  [name]  is a 72 y.o. male with a known history of otitis C, hypertension, depression, tobacco abuse, anxiety, osteoarthritis who presents to the hospital due to dizziness, loss of vision on the right eye in the periphery over the past few days.  Patient was seen by his primary care physician who thought his symptoms were related to vertigo and was put on some meclizine but it did not improve his symptoms.  He called the primary care physician back who then ordered a MRI of his brain which was positive for an acute CVA.  He was sent over to the ER for further evaluation.  Hospitalist services were asked for admission.  Patient denies any headache, nausea, vomiting, focal weakness or numbness anywhere in his body.  He denies any other associated symptoms presently.     HOSPITAL COURSE:   * Acute R PCA CVA With vision changes.  Symptoms slowly improving.  Worked with physical therapy and Occupational Therapy.  Outpatient services set up.  Follow-up with neurology as outpatient.  Started on aspirin and Plavix at discharge.  Statin added. Seen by neurology in  the hospital.  No dysphagia.  Ambulating well.  Patient stable for discharge home.  CONSULTS OBTAINED:  Treatment Team:  Leotis Pain, MD  DRUG ALLERGIES:  No Known Allergies  DISCHARGE MEDICATIONS:   Allergies as of 03/13/2018   No Known Allergies     Medication List    TAKE these medications   aspirin EC 81 MG tablet Take 1 tablet (81 mg total) by mouth daily.   atorvastatin 40 MG tablet Commonly known as:  LIPITOR Take 1 tablet (40 mg total) by mouth daily at 6 PM.   cloNIDine 0.1 MG tablet Commonly known as:  CATAPRES Take 0.1 mg by mouth 2 (two) times daily.   clopidogrel 75 MG tablet Commonly known as:  PLAVIX Take 1 tablet (75 mg total) by mouth daily.   LORazepam 0.5 MG tablet Commonly known as:  ATIVAN Take 0.5 mg by mouth daily as needed for anxiety. Reported on 02/26/2016   meclizine 25 MG tablet Commonly known as:  ANTIVERT Take 1 tablet by mouth 3 (three) times daily.   tiZANidine 4 MG tablet Commonly known as:  ZANAFLEX Take 4 mg by mouth every 8 (eight) hours as needed.   traMADol-acetaminophen 37.5-325 MG tablet Commonly known as:  ULTRACET TAKE 1 TABLET EVERY 8 HOURS AS NEEDED   traZODone 150 MG tablet Commonly known as:  DESYREL Take 150 mg by mouth at  bedtime.     ASK your doctor about these medications   nicotine 14 mg/24hr patch Commonly known as:  NICODERM CQ - dosed in mg/24 hours Place 1 patch (14 mg total) onto the skin daily for 15 days. Ask about: Should I take this medication?       Today   VITAL SIGNS:  Blood pressure (!) 141/80, pulse 68, temperature 98 F (36.7 C), temperature source Oral, resp. rate 15, height 5\' 2"  (1.575 m), weight 68.5 kg (151 lb), SpO2 97 %.  I/O:  No intake or output data in the 24 hours ending 04/01/18 1450  PHYSICAL EXAMINATION:  Physical Exam  GENERAL:  72 y.o.-year-old patient lying in the bed with no acute distress.  LUNGS: Normal breath sounds bilaterally, no wheezing,  rales,rhonchi or crepitation. No use of accessory muscles of respiration.  CARDIOVASCULAR: S1, S2 normal. No murmurs, rubs, or gallops.  ABDOMEN: Soft, non-tender, non-distended. Bowel sounds present. No organomegaly or mass.  NEUROLOGIC: Moves all 4 extremities. PSYCHIATRIC: The patient is alert and oriented x 3.  SKIN: No obvious rash, lesion, or ulcer.   DATA REVIEW:   CBC No results for input(s): WBC, HGB, HCT, PLT in the last 168 hours.  Chemistries  No results for input(s): NA, K, CL, CO2, GLUCOSE, BUN, CREATININE, CALCIUM, MG, AST, ALT, ALKPHOS, BILITOT in the last 168 hours.  Invalid input(s): GFRCGP  Cardiac Enzymes No results for input(s): TROPONINI in the last 168 hours.  Microbiology Results  Results for orders placed or performed during the hospital encounter of 05/07/12  Surgical pcr screen     Status: None   Collection Time: 05/07/12  9:54 AM  Result Value Ref Range Status   MRSA, PCR NEGATIVE NEGATIVE Final   Staphylococcus aureus NEGATIVE NEGATIVE Final    Comment:        The Xpert SA Assay (FDA approved for NASAL specimens only), is one component of a comprehensive surveillance program.  It is not intended to diagnose infection nor to guide or monitor treatment.    RADIOLOGY:  No results found.  Follow up with PCP in 1 week.  Management plans discussed with the patient, family and they are in agreement.  CODE STATUS:  Code Status History    Date Active Date Inactive Code Status Order ID Comments User Context   03/12/2018 2017 03/13/2018 2110 Full Code [id number]  Henreitta Leber, MD Inpatient   07/28/2012 1508 07/29/2012 1324 Full Code [id number]  Carlisle Cater, PA ED      TOTAL TIME TAKING CARE OF THIS PATIENT ON DAY OF DISCHARGE: more than 30 minutes.   Neita Carp M.D on 04/01/2018 at 2:50 PM  Between 7am to 6pm - Pager - [phone]  After 6pm go to [email or web address] - password [email or web address] [facility]  Office   [phone]  CC: Primary care physician; Tracie Harrier, MD  Note: This dictation was prepared with Dragon dictation along with smaller phrase technology. Any transcriptional errors that result from this process are unintentional.

## 2018-05-19 NOTE — Progress Notes (Signed)
05/21/2018 10:55 AM   [name] Oct 24, 1945 [id number]  Referring provider: Tracie Harrier, [facility] [place], [place] [address]  Chief Complaint  Patient presents with  . Hematuria    HPI: Patient is a 72 -year-old [place] American male who presents today as a referral from Dr. Tracie Harrier for microscopic hematuria.    Patient was found to have microscopic hematuria on 04/22/2018 with > 50 RBC's/hpf.      He has a history of nephrolithiasis.  He underwent ESWL in 2011.  Stone composition is unknown.  He does not have a prior history of recurrent urinary tract infections, trauma to the genitourinary tract, BPH or malignancies of the genitourinary tract.   He does not have a family medical history of nephrolithiasis, malignancies of the genitourinary tract or hematuria.   Today, he is having symptoms of dysuria and suprapubic pain that has been going on for awhile.  Patient denies any gross hematuria, dysuria or suprapubic/flank pain.  Patient denies any fevers, chills, nausea or vomiting.   His UA today demonstrates > 30 RBC's.    He is a smoker.    He has not worked with Sports administrator, trichloroethylene, etc.    He has HTN.   He has a high BMI.     PMH: Past Medical History:  Diagnosis Date  . Anxiety   . Arthritis   . Bell's palsy    hx of, right side  . Depression    hx of depression  . Hepatitis    hx of hepatitis C  . Hypertension   . Kidney stone    hx of  . Substance abuse [facility])    heroin    Surgical History: Past Surgical History:  Procedure Laterality Date  . ANTERIOR LUMBAR FUSION  05/13/2012   Procedure: ANTERIOR LUMBAR FUSION 1 LEVEL;  Surgeon: Melina Schools, MD;  Location: [facility];  Service: Orthopedics;  Laterality: N/A;  ALIF L5-S1  . CHOLECYSTECTOMY    . KIDNEY STONE SURGERY    . LUMBAR FUSION  05/13/2012    Home Medications:  Allergies as of 05/21/2018   No Known Allergies     Medication  List        Accurate as of 05/21/18 10:55 AM. Always use your most recent med list.          aspirin EC 81 MG tablet Take 1 tablet (81 mg total) by mouth daily.   atorvastatin 40 MG tablet Commonly known as:  LIPITOR Take 1 tablet (40 mg total) by mouth daily at 6 PM.   cloNIDine 0.1 MG tablet Commonly known as:  CATAPRES Take 0.1 mg by mouth 2 (two) times daily.   clopidogrel 75 MG tablet Commonly known as:  PLAVIX Take 1 tablet (75 mg total) by mouth daily.   FLUoxetine 20 MG capsule Commonly known as:  PROZAC Take by mouth daily.   LORazepam 0.5 MG tablet Commonly known as:  ATIVAN Take 0.5 mg by mouth daily as needed for anxiety. Reported on 02/26/2016   meclizine 25 MG tablet Commonly known as:  ANTIVERT Take 1 tablet by mouth 3 (three) times daily.   tiZANidine 4 MG tablet Commonly known as:  ZANAFLEX Take 4 mg by mouth every 8 (eight) hours as needed.   traMADol-acetaminophen 37.5-325 MG tablet Commonly known as:  ULTRACET TAKE 1 TABLET EVERY 8 HOURS AS NEEDED   traZODone 150 MG tablet Commonly known as:  DESYREL Take 150 mg  by mouth at bedtime.       Allergies: No Known Allergies  Family History: Family History  Problem Relation Age of Onset  . Stroke Mother   . Lung cancer Father     Social History:  reports that he has been smoking cigarettes.  He has a 106.00 pack-year smoking history. He has never used smokeless tobacco. He reports that he drank alcohol. He reports that he does not use drugs.  ROS: UROLOGY Frequent Urination?: No Hard to postpone urination?: No Burning/pain with urination?: Yes Get up at night to urinate?: No Leakage of urine?: No Urine stream starts and stops?: No Trouble starting stream?: No Do you have to strain to urinate?: No Blood in urine?: Yes Urinary tract infection?: No Sexually transmitted disease?: No Injury to kidneys or bladder?: No Painful intercourse?: No Weak stream?: No Erection problems?:  Yes Penile pain?: No  Gastrointestinal Nausea?: No Vomiting?: No Indigestion/heartburn?: No Diarrhea?: No Constipation?: No  Constitutional Fever: No Night sweats?: No Weight loss?: No Fatigue?: No  Skin Skin rash/lesions?: No Itching?: No  Eyes Blurred vision?: No Double vision?: No  Ears/Nose/Throat Sore throat?: No Sinus problems?: No  Hematologic/Lymphatic Swollen glands?: No Easy bruising?: No  Cardiovascular Leg swelling?: No Chest pain?: No  Respiratory Cough?: No Shortness of breath?: No  Endocrine Excessive thirst?: No  Musculoskeletal Back pain?: Yes Joint pain?: Yes  Neurological Headaches?: Yes Dizziness?: Yes  Psychologic Depression?: Yes Anxiety?: Yes  Physical Exam: There were no vitals taken for this visit.  Constitutional:  Well nourished. Alert and oriented, No acute distress. HEENT: [place] AT, moist mucus membranes.  Trachea midline, no masses. Cardiovascular: No clubbing, cyanosis, or edema. Respiratory: Normal respiratory effort, no increased work of breathing. GI: Abdomen is soft, non tender, non distended, no abdominal masses. Liver and spleen not palpable.  No hernias appreciated.  Stool sample for occult testing is not indicated.   GU: No CVA tenderness.  No bladder fullness or masses.  Patient with circumcised phallus.  Urethral meatus is patent.  No penile discharge. No penile lesions or rashes. Scrotum without lesions, cysts, rashes and/or edema.  Testicles are located scrotally bilaterally. No masses are appreciated in the testicles. Left and right epididymis are normal. Rectal: Patient with  normal sphincter tone. Anus and perineum without scarring or rashes. No rectal masses are appreciated. Prostate is approximately 35 grams, no nodules are appreciated. Seminal vesicles are normal. Skin: No rashes, bruises or suspicious lesions. Lymph: No cervical or inguinal adenopathy. Neurologic: Grossly intact, no focal deficits, moving  all 4 extremities. Psychiatric: Normal mood and affect.  Laboratory Data: Lab Results  Component Value Date   WBC 10.2 03/12/2018   HGB 14.9 03/12/2018   HCT 43.6 03/12/2018   MCV 91.7 03/12/2018   PLT 195 03/12/2018    Lab Results  Component Value Date   CREATININE 0.80 03/12/2018    No results found for: PSA  No results found for: TESTOSTERONE  No results found for: HGBA1C  No results found for: TSH     Component Value Date/Time   CHOL 200 03/13/2018 0702   HDL 20 (L) 03/13/2018 0702   CHOLHDL 10.0 03/13/2018 0702   VLDL 49 (H) 03/13/2018 0702   LDLCALC 131 (H) 03/13/2018 0702    Lab Results  Component Value Date   AST 23 03/12/2018   Lab Results  Component Value Date   ALT 15 (L) 03/12/2018   No components found for: ALKALINEPHOPHATASE No components found for: BILIRUBINTOTAL  No results  found for: ESTRADIOL   Urinalysis >30 RBC's.  See Epic.    I have reviewed the labs.  Assessment & Plan:    1. Microscopic hematuria Explained to the patient that there are a number of causes that can be associated with blood in the urine, such as stones,  BPH, UTI's, damage to the urinary tract and/or cancer. At this time, I felt that the patient warranted further urologic evaluation.   The AUA guidelines state that a CT urogram is the preferred imaging study to evaluate hematuria. I explained to the patient that a contrast material will be injected into a vein and that in rare instances, an allergic reaction can result and may even life threatening   The patient denies any allergies to contrast, iodine and/or seafood and is not taking metformin Following the imaging study,  I've recommended a cystoscopy. I described how this is performed, typically in an office setting with a flexible cystoscope. We described the risks, benefits, and possible side effects, the most common of which is a minor amount of blood in the urine and/or burning which usually resolves in 24 to 48  hours.   The patient had the opportunity to ask questions which were answered. Based upon this discussion, the patient is willing to proceed. Therefore, I've ordered: a CT Urogram and cystoscopy.  The patient will return following all of the above for discussion of the results.  UA > 30 RBC's  Urine culture pending  BUN + creatinine pending   Return for CT Urogram report and cystoscopy.  These notes generated with voice recognition software. I apologize for typographical errors.  Zara Council, PA-C  [facility] [address] [place]  [place], [place] [address] [phone]

## 2018-05-21 ENCOUNTER — Encounter: Payer: Self-pay | Admitting: Urology

## 2018-05-21 ENCOUNTER — Ambulatory Visit (INDEPENDENT_AMBULATORY_CARE_PROVIDER_SITE_OTHER): Payer: Medicare HMO | Admitting: Urology

## 2018-05-21 VITALS — BP 133/71 | HR 73 | Ht 62.0 in | Wt 146.4 lb

## 2018-05-21 DIAGNOSIS — R3129 Other microscopic hematuria: Secondary | ICD-10-CM | POA: Diagnosis not present

## 2018-05-21 LAB — MICROSCOPIC EXAMINATION
EPITHELIAL CELLS (NON RENAL): NONE SEEN /HPF (ref 0–10)
RBC, UA: >30 /hpf — ABNORMAL HIGH (ref 0–2)

## 2018-05-21 LAB — URINALYSIS, COMPLETE
BILIRUBIN UA: NEGATIVE
GLUCOSE, UA: NEGATIVE
LEUKOCYTES UA: NEGATIVE
NITRITE UA: NEGATIVE
Urobilinogen, Ur: 0.2 mg/dL (ref 0.2–1.0)
pH, UA: 5.5 (ref 5.0–7.5)

## 2018-05-21 NOTE — Patient Instructions (Signed)
Hematuria, Adult Hematuria is blood in your urine. It can be caused by a bladder infection, kidney infection, prostate infection, kidney stone, or cancer of your urinary tract. Infections can usually be treated with medicine, and a kidney stone usually will pass through your urine. If neither of these is the cause of your hematuria, further workup to find out the reason may be needed. It is very important that you tell your health care provider about any blood you see in your urine, even if the blood stops without treatment or happens without causing pain. Blood in your urine that happens and then stops and then happens again can be a symptom of a very serious condition. Also, pain is not a symptom in the initial stages of many urinary cancers. Follow these instructions at home:  Drink lots of fluid, 3-4 quarts a day. If you have been diagnosed with an infection, cranberry juice is especially recommended, in addition to large amounts of water.  Avoid caffeine, tea, and carbonated beverages because they tend to irritate the bladder.  Avoid alcohol because it may irritate the prostate.  Take all medicines as directed by your health care provider.  If you were prescribed an antibiotic medicine, finish it all even if you start to feel better.  If you have been diagnosed with a kidney stone, follow your health care provider's instructions regarding straining your urine to catch the stone.  Empty your bladder often. Avoid holding urine for long periods of time.  After a bowel movement, women should cleanse front to back. Use each tissue only once.  Empty your bladder before and after sexual intercourse if you are a male. Contact a health care provider if:  You develop back pain.  You have a fever.  You have a feeling of sickness in your stomach (nausea) or vomiting.  Your symptoms are not better in 3 days. Return sooner if you are getting worse. Get help right away if:  You develop  severe vomiting and are unable to keep the medicine down.  You develop severe back or abdominal pain despite taking your medicines.  You begin passing a large amount of blood or clots in your urine.  You feel extremely weak or faint, or you pass out. This information is not intended to replace advice given to you by your health care provider. Make sure you discuss any questions you have with your health care provider. Document Released: 10/07/2005 Document Revised: 03/14/2016 Document Reviewed: 06/07/2013 Elsevier Interactive Patient Education  2017 [employer]  CT Scan A CT scan (computed tomography scan) is an imaging scan. It uses X-rays and a computer to make detailed pictures of different areas inside the body. A CT scan can give more information than a regular X-ray exam. A CT scan provides data about internal organs, soft tissue structures, blood vessels, and bones. In this procedure, the pictures will be taken in a large machine that has an opening (CT scanner). Tell a health care provider about:  Any allergies you have.  All medicines you are taking, including vitamins, herbs, eye drops, creams, and over-the-counter medicines.  Any blood disorders you have.  Any surgeries you have had.  Any medical conditions you have.  Whether you are pregnant or may be pregnant. What are the risks? Generally, this is a safe procedure. However, problems may occur, including:  An allergic reaction to dyes.  Development of cancer from excessive exposure to radiation from multiple CT scans. This is rare.  What happens before   the procedure? Staying hydrated Follow instructions from your health care provider about hydration, which may include:  Up to 2 hours before the procedure - you may continue to drink clear liquids, such as water, clear fruit juice, black coffee, and plain tea.  Eating and drinking restrictions Follow instructions from your health care provider about eating and  drinking, which may include:  24 hours before the procedure - stop drinking caffeinated beverages, such as energy drinks, tea, soda, coffee, and hot chocolate.  8 hours before the procedure - stop eating heavy meals or foods such as meat, fried foods, or fatty foods.  6 hours before the procedure - stop eating light meals or foods, such as toast or cereal.  6 hours before the procedure - stop drinking milk or drinks that contain milk.  2 hours before the procedure - stop drinking clear liquids.  General instructions  Remove any jewelry.  Ask your health care provider about changing or stopping your regular medicines. This is especially important if you are taking diabetes medicines or blood thinners. What happens during the procedure?  You will lie on a table with your arms above your head.  An IV tube may be inserted into one of your veins.  The contrast dye may be injected into the IV tube. You may feel warm or have a metallic taste in your mouth.  The table you will be lying on will move into the CT scanner.  You will be able to see, hear, and talk to the person running the machine while you are in it. Follow that person's instructions.  The CT scanner will move around you to take pictures. Do not move while it is scanning. Staying still helps the scanner to get a good image.  When the best possible pictures have been taken, the machine will be turned off. The table will be moved out of the machine.  The IV tube will be removed. The procedure may vary among health care providers and hospitals. What happens after the procedure?  It is up to you to get the results of your procedure. Ask your health care provider, or the department that is doing the procedure, when your results will be ready. Summary  A CT scan is an imaging scan.  A CT scan uses X-rays and a computer to make detailed pictures of different areas of your body.  Follow instructions from your health care  provider about eating and drinking before the procedure.  You will be able to see, hear, and talk to the person running the machine while you are in it. Follow that person's instructions. This information is not intended to replace advice given to you by your health care provider. Make sure you discuss any questions you have with your health care provider. Document Released: 11/14/2004 Document Revised: 11/09/2016 Document Reviewed: 11/09/2016 Elsevier Interactive Patient Education  2018 [employer]  Cystoscopy Cystoscopy is a procedure that is used to help diagnose and sometimes treat conditions that affect that lower urinary tract. The lower urinary tract includes the bladder and the tube that drains urine from the bladder out of the body (urethra). Cystoscopy is performed with a thin, tube-shaped instrument with a light and camera at the end (cystoscope). The cystoscope may be hard (rigid) or flexible, depending on the goal of the procedure.The cystoscope is inserted through the urethra, into the bladder. Cystoscopy may be recommended if you have:  Urinary tractinfections that keep coming back (recurring).  Blood in the urine (hematuria).    Loss of bladder control (urinary incontinence) or an overactive bladder.  Unusual cells found in a urine sample.  A blockage in the urethra.  Painful urination.  An abnormality in the bladder found during an intravenous pyelogram (IVP) or CT scan.  Cystoscopy may also be done to remove a sample of tissue to be examined under a microscope (biopsy). Tell a health care provider about:  Any allergies you have.  All medicines you are taking, including vitamins, herbs, eye drops, creams, and over-the-counter medicines.  Any problems you or family members have had with anesthetic medicines.  Any blood disorders you have.  Any surgeries you have had.  Any medical conditions you have.  Whether you are pregnant or may be pregnant. What are the  risks? Generally, this is a safe procedure. However, problems may occur, including:  Infection.  Bleeding.  Allergic reactions to medicines.  Damage to other structures or organs.  What happens before the procedure?  Ask your health care provider about: ? Changing or stopping your regular medicines. This is especially important if you are taking diabetes medicines or blood thinners. ? Taking medicines such as aspirin and ibuprofen. These medicines can thin your blood. Do not take these medicines before your procedure if your health care provider instructs you not to.  Follow instructions from your health care provider about eating or drinking restrictions.  You may be given antibiotic medicine to help prevent infection.  You may have an exam or testing, such as X-rays of the bladder, urethra, or kidneys.  You may have urine tests to check for signs of infection.  Plan to have someone take you home after the procedure. What happens during the procedure?  To reduce your risk of infection,your health care team will wash or sanitize their hands.  You will be given one or more of the following: ? A medicine to help you relax (sedative). ? A medicine to numb the area (local anesthetic).  The area around the opening of your urethra will be cleaned.  The cystoscope will be passed through your urethra into your bladder.  Germ-free (sterile)fluid will flow through the cystoscope to fill your bladder. The fluid will stretch your bladder so that your surgeon can clearly examine your bladder walls.  The cystoscope will be removed and your bladder will be emptied. The procedure may vary among health care providers and hospitals. What happens after the procedure?  You may have some soreness or pain in your abdomen and urethra. Medicines will be available to help you.  You may have some blood in your urine.  Do not drive for 24 hours if you received a sedative. This information is  not intended to replace advice given to you by your health care provider. Make sure you discuss any questions you have with your health care provider. Document Released: 10/04/2000 Document Revised: 02/15/2016 Document Reviewed: 08/24/2015 Elsevier Interactive Patient Education  2018 [employer]  

## 2018-05-22 DIAGNOSIS — H539 Unspecified visual disturbance: Secondary | ICD-10-CM | POA: Insufficient documentation

## 2018-05-22 DIAGNOSIS — Z8673 Personal history of transient ischemic attack (TIA), and cerebral infarction without residual deficits: Secondary | ICD-10-CM | POA: Insufficient documentation

## 2018-05-22 LAB — BUN+CREAT
BUN / CREAT RATIO: 13 (ref 10–24)
BUN: 10 mg/dL (ref 8–27)
CREATININE: 0.79 mg/dL (ref 0.76–1.27)
GFR calc Af Amer: 105 mL/min/{1.73_m2} (ref >59)
GFR calc non Af Amer: 91 mL/min/{1.73_m2} (ref >59)

## 2018-05-24 LAB — CULTURE, URINE COMPREHENSIVE

## 2018-06-03 ENCOUNTER — Other Ambulatory Visit: Payer: Self-pay

## 2018-06-03 ENCOUNTER — Encounter: Payer: Self-pay | Admitting: Emergency Medicine

## 2018-06-03 ENCOUNTER — Emergency Department: Payer: Medicare HMO

## 2018-06-03 ENCOUNTER — Emergency Department
Admission: EM | Admit: 2018-06-03 | Discharge: 2018-06-03 | Disposition: A | Discharge status: Home / Self Care | Payer: Medicare HMO | Attending: Student in an Organized Health Care Education/Training Program | Admitting: Student in an Organized Health Care Education/Training Program

## 2018-06-03 DIAGNOSIS — Z8673 Personal history of transient ischemic attack (TIA), and cerebral infarction without residual deficits: Secondary | ICD-10-CM | POA: Insufficient documentation

## 2018-06-03 DIAGNOSIS — Z7902 Long term (current) use of antithrombotics/antiplatelets: Secondary | ICD-10-CM | POA: Diagnosis not present

## 2018-06-03 DIAGNOSIS — F17210 Nicotine dependence, cigarettes, uncomplicated: Secondary | ICD-10-CM | POA: Diagnosis not present

## 2018-06-03 DIAGNOSIS — R42 Dizziness and giddiness: Secondary | ICD-10-CM

## 2018-06-03 DIAGNOSIS — E860 Dehydration: Secondary | ICD-10-CM

## 2018-06-03 DIAGNOSIS — Z7982 Long term (current) use of aspirin: Secondary | ICD-10-CM | POA: Diagnosis not present

## 2018-06-03 DIAGNOSIS — I10 Essential (primary) hypertension: Secondary | ICD-10-CM | POA: Diagnosis not present

## 2018-06-03 DIAGNOSIS — R55 Syncope and collapse: Secondary | ICD-10-CM

## 2018-06-03 DIAGNOSIS — Z79899 Other long term (current) drug therapy: Secondary | ICD-10-CM | POA: Insufficient documentation

## 2018-06-03 DIAGNOSIS — W19XXXA Unspecified fall, initial encounter: Secondary | ICD-10-CM

## 2018-06-03 LAB — CBC
HEMATOCRIT: 40 % (ref 40.0–52.0)
HEMOGLOBIN: 13.5 g/dL (ref 13.0–18.0)
MCH: 31.8 pg (ref 26.0–34.0)
MCHC: 33.8 g/dL (ref 32.0–36.0)
MCV: 94 fL (ref 80.0–100.0)
Platelets: 233 10*3/uL (ref 150–440)
RBC: 4.26 MIL/uL — ABNORMAL LOW (ref 4.40–5.90)
RDW: 14.9 % — ABNORMAL HIGH (ref 11.5–14.5)
WBC: 12 10*3/uL — ABNORMAL HIGH (ref 3.8–10.6)

## 2018-06-03 LAB — URINALYSIS, COMPLETE (UACMP) WITH MICROSCOPIC
BACTERIA UA: NONE SEEN
Bilirubin Urine: NEGATIVE
GLUCOSE, UA: NEGATIVE mg/dL
KETONES UR: 20 mg/dL — AB
Leukocytes, UA: NEGATIVE
NITRITE: NEGATIVE
PROTEIN: 100 mg/dL — AB
RBC / HPF: >50 RBC/hpf — ABNORMAL HIGH (ref 0–5)
Specific Gravity, Urine: 1.027 (ref 1.005–1.030)
pH: 6 (ref 5.0–8.0)

## 2018-06-03 LAB — TROPONIN I: Troponin I: <0.03 ng/mL (ref <0.03)

## 2018-06-03 LAB — BASIC METABOLIC PANEL
ANION GAP: 11 (ref 5–15)
BUN: 21 mg/dL (ref 8–23)
CHLORIDE: 104 mmol/L (ref 98–111)
CO2: 23 mmol/L (ref 22–32)
Calcium: 9.2 mg/dL (ref 8.9–10.3)
Creatinine, Ser: 0.85 mg/dL (ref 0.61–1.24)
GFR calc Af Amer: >60 mL/min (ref >60)
GLUCOSE: 173 mg/dL — AB (ref 70–99)
POTASSIUM: 4.2 mmol/L (ref 3.5–5.1)
Sodium: 138 mmol/L (ref 135–145)

## 2018-06-03 IMAGING — CT CT ANGIO NECK
1 of 11 series · 5 of 33 positions shown · IV contrast (iopamidol)
Comparison: MRI brain 03/12/2018.

CLINICAL DATA: Known carotid stenosis.  Right PCA infarct.

EXAM:
CT ANGIOGRAPHY HEAD AND NECK
TECHNIQUE: Multidetector CT imaging of the head and neck was performed using
the standard protocol during bolus administration of intravenous
contrast. Multiplanar CT image reconstructions and MIPs were
obtained to evaluate the vascular anatomy. Carotid stenosis
measurements (when applicable) are obtained utilizing NASCET
criteria, using the distal internal carotid diameter as the
denominator.
CONTRAST:  75mL 4B8VWB-A2Q IOPAMIDOL (4B8VWB-A2Q) INJECTION 76%

[Series 7: ax thin · axial · 0.48mm/px · z∈[-276,-51]mm · 5 of 339 slices shown]
[im 57/339  soft-tissue]
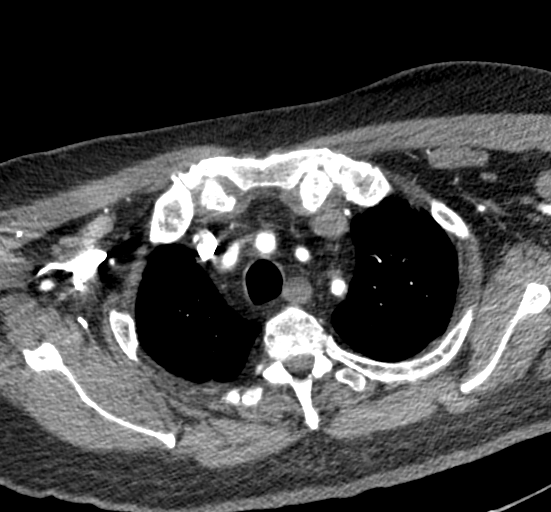
[im 113/339  bone]
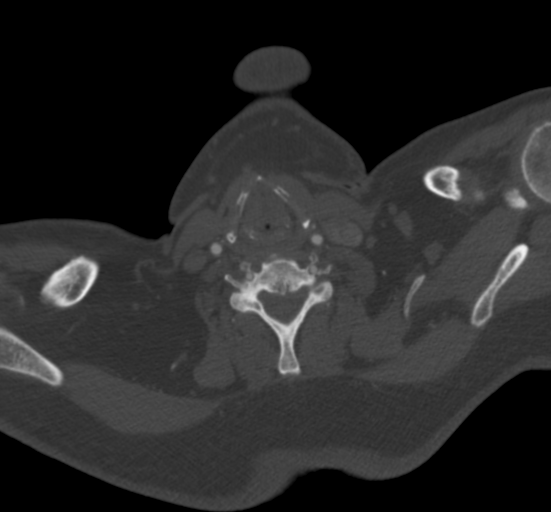
[im 170/339  soft-tissue]
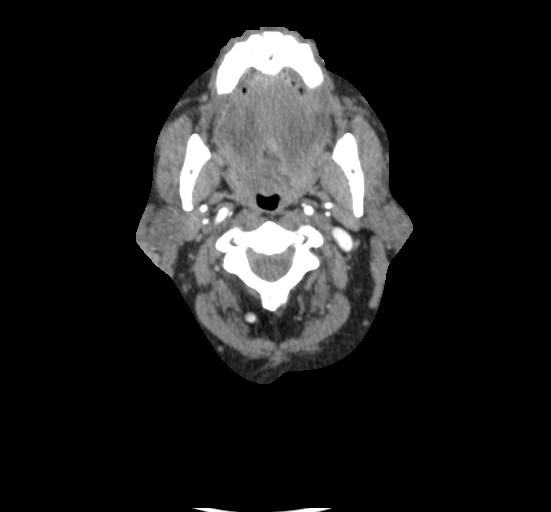
[im 226/339  bone]
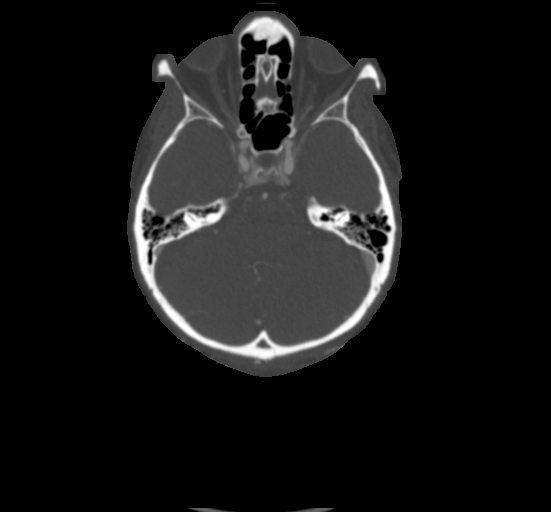
[im 282/339  soft-tissue]
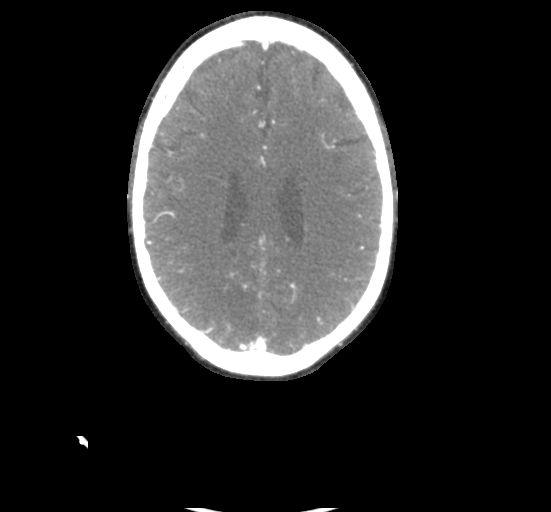

[5 of 33 positions shown; findings below may reference images not displayed]

FINDINGS: CT HEAD FINDINGS

Brain: Noncontrast imaging of the brain demonstrates expected
evolution of a nonhemorrhagic right medial occipital lobe infarct.
No other focal acute or chronic cortical infarct is present. Basal
ganglia are intact. Brainstem and cerebellum are normal.

Ventricles are of normal size. No significant extra-axial fluid
collection is present.

Vascular: No hyperdense vessel or unexpected calcification.

Skull: Calvarium is intact. No focal lytic or blastic lesions are
present.

Sinuses: The paranasal sinuses and mastoid air cells are clear.

Orbits: Globes and orbits are within normal limits.

Review of the MIP images confirms the above findings

CTA NECK FINDINGS

Aortic arch: A 3 vessel arch configuration is present. There is some
motion artifact. No definite stenosis is present at the arch.
Atherosclerotic changes are present in the distal arch without
aneurysm or stenosis.

Right carotid system: The right common carotid artery is within
normal limits. Atherosclerotic irregularity is present with
noncalcified plaque at the proximal right ICA. There is no
significant stenosis. Mild tortuosity is present in the cervical
right ICA without significant stenosis.

Left carotid system: The left common carotid artery is within normal
limits. Noncalcified plaque is present at the proximal left internal
carotid artery. The lumen is narrowed to 2 mm approximately 1 cm
from the bifurcation. This compares with a more distal luminal
measurement of 4 mm. The cervical left ICA is otherwise within
normal limits.

Vertebral arteries: The vertebral arteries are codominant. Both
arteries originate from the subclavian arteries without significant
stenosis. There is no significant stenosis of either vertebral
artery in the neck.

Skeleton: Chronic endplate changes are present at C5-6.
Uncovertebral spurring at C5-6 results in mild foraminal narrowing
bilaterally. AP alignment is anatomic. Vertebral body heights are
maintained. No focal lytic or blastic lesions are present.

Other neck: Bilateral posterior parotid heterogeneous hyperdense
lesions are noted. The right-sided lesion measures 1.3 x 1.8 x
cm. The left-sided lesion is smaller, measuring 1.5 x 0.9 x 1.2 cm.
Parotid glands are otherwise within normal limits. The submandibular
glands are unremarkable. No significant cervical adenopathy is
present. The thyroid is normal.

Upper chest: The lung apices demonstrate mild peripheral
interstitial. No focal nodule or mass lesion is present. Crest inlet
is otherwise within normal limits.

Review of the MIP images confirms the above findings

CTA HEAD FINDINGS

Anterior circulation: Internal carotid arteries are within normal
limits from the skull base through the ICA termini. The A1 and M1
segments are normal. Anterior communicating artery is patent. MCA
bifurcations are intact. There is segmental narrowing of distal ACA
and MCA branch vessels bilaterally. A moderate proximal M2 stenosis
is present in the posterior division approximately 2 cm from the
bifurcation. The more distal vessel opacifies normally.

Posterior circulation: Vertebral arteries are codominant. PICA
origins are visualized and normal. Basilar artery is normal. Both
posterior cerebral arteries originate from the basilar tip. There is
a moderate stenosis in the distal right P2 segment which likely
accounts for the right PCA territory infarct. The left P2 segment is
more robust. There is attenuation of distal PCA branch vessels
bilaterally.

Venous sinuses: The dural sinuses are patent. Straight sinus and
deep cerebral veins are intact. Cortical veins are within normal
limits.

Anatomic variants: None

Delayed phase: There is mild enhancement of the infarct territory,
as expected. No other pathologic enhancement is present.

Review of the MIP images confirms the above findings
IMPRESSION: 1. Acute/subacute right medial occipital lobe PCA territory
nonhemorrhagic infarct.
2. 50% stenosis of the proximal left internal carotid artery.
3. Mild tortuosity of the cervical internal carotid arteries
bilaterally without other significant stenosis. This is most
commonly seen in the setting of chronic hypertension.
4. Proximal inferior division right M2 segment stenosis with more
normal distal opacification.
5. Moderate stenosis of the distal right P2 segment likely
accounting for the area of infarction. No significant occlusion is
evident.
6. Diffuse small vessel disease compatible with intracranial
atherosclerosis.
7. Bilateral posterior parotid hyperdense lesions. These appear
benign and likely represent Warthin's tumors.

These results were called by telephone at the time of interpretation
on 03/13/2018 at [DATE] to Dr. NICA CELESTIN , who verbally
acknowledged these results.

## 2018-06-03 MED ORDER — SODIUM CHLORIDE 0.9 % IV BOLUS
1000.0000 mL | Freq: Once | INTRAVENOUS | Status: AC
  Administered 2018-06-03: 1000 mL via INTRAVENOUS

## 2018-06-03 NOTE — ED Triage Notes (Signed)
Pt arrived via POV with family with reports of dizziness and falling into the fridge. Pt states he gets periods of dizziness intermittently with ambulation.    Pt states he took clonidine this morning around 0600.    Pt states he took tramadol for his back pain PTA.  Pt has hx of CVA 2 months and has been off balance since then.

## 2018-06-03 NOTE — ED Notes (Signed)
Provider in room with patient at this time.

## 2018-06-03 NOTE — ED Notes (Signed)
Spoke with pt about wait times and what to expect next. Advised pt that I am available for further questions if needed.  

## 2018-06-03 NOTE — ED Notes (Addendum)
FIRST NURSE NOTE:  Pt's daughter states pt frequently abuses narcotics and requested that he not be administered any while he is here.  Daughter states they are actively trying to find a treatment center for him and would welcome any recommendations.

## 2018-06-03 NOTE — ED Notes (Signed)
Pt is resting in bed and stated that he is ready to go home. Respirations even/unlabored. Nadn.

## 2018-06-04 NOTE — ED Provider Notes (Signed)
[facility] Provider Note    First MD Initiated Contact with Patient 06/03/18 2123     (approximate)  I have reviewed the triage vital signs and the nursing notes.   HISTORY  Chief Complaint Dizziness and Fall    HPI MELISSA PULIDO is a 72 y.o. male presents the ER with chief complaint of dizziness and lightheadedness with near syncopal episode that occurred after the patient got back from shopping with his family.  Denies any chest pain or palpitations.  States he has had decreased p.o. intake today.  States he fell against the refrigerator after he was feeling he needed something to drink.  Denies any nausea or vomiting.  No lateralizing weakness.  Just felt generalized weakness.   Past Medical History:  Diagnosis Date  . Anxiety   . Arthritis   . Bell's palsy    hx of, right side  . Depression    hx of depression  . Hepatitis    hx of hepatitis C  . Hypertension   . Kidney stone    hx of  . Substance abuse ([place])    heroin   Family History  Problem Relation Age of Onset  . Stroke Mother   . Lung cancer Father    Past Surgical History:  Procedure Laterality Date  . ANTERIOR LUMBAR FUSION  05/13/2012   Procedure: ANTERIOR LUMBAR FUSION 1 LEVEL;  Surgeon: Melina Schools, MD;  Location: [facility];  Service: Orthopedics;  Laterality: N/A;  ALIF L5-S1  . CHOLECYSTECTOMY    . KIDNEY STONE SURGERY    . LUMBAR FUSION  05/13/2012   Patient Active Problem List   Diagnosis Date Noted  . CVA (cerebral vascular accident) ([place]) 03/12/2018  . PAD (peripheral artery disease) ([place]) 04/20/2017  . Chronic hepatitis C without hepatic coma ([place]) 03/12/2017  . Pain and swelling of lower extremity 01/14/2016  . Chronic hip pain (Right) 01/14/2016  . Osteoarthritis of hip (Right) 01/14/2016  . Generalized anxiety disorder 01/14/2016  . Depression with anxiety 01/14/2016  . Lumbar facet syndrome 01/14/2016  . Clinical depression 01/11/2016    . BP (high blood pressure) 01/11/2016  . Insomnia, psychophysiological 01/11/2016  . Current tobacco use 01/11/2016  . Chronic low back pain (Location of Primary Source of Pain) ([place]) 01/11/2016  . Chronic pain 01/11/2016  . Lumbar spondylosis 01/11/2016  . Long term current use of opiate analgesic 01/11/2016  . Long term prescription opiate use 01/11/2016  . Opiate use (15 MME/Day) 01/11/2016  . Encounter for therapeutic drug level monitoring 01/11/2016  . Encounter for pain management planning 01/11/2016  . Failed back surgical syndrome (S/P anterior fusion at L5-S1) 01/11/2016  . Lumbar facet arthropathy 01/11/2016  . Lumbosacral radiculitis 08/16/2014  . DDD (degenerative disc disease), lumbar 08/16/2014  . Discogenic low back pain 04/27/2012      Prior to Admission medications   Medication Sig Start Date End Date Taking? Authorizing Provider  aspirin EC 81 MG tablet Take 1 tablet (81 mg total) by mouth daily. 03/13/18   Hillary Bow, MD  atorvastatin (LIPITOR) 40 MG tablet Take 1 tablet (40 mg total) by mouth daily at 6 PM. 03/13/18   Hillary Bow, MD  cloNIDine (CATAPRES) 0.1 MG tablet Take 0.1 mg by mouth 2 (two) times daily.  12/30/16   [provider]  clopidogrel (PLAVIX) 75 MG tablet Take 1 tablet (75 mg total) by mouth daily. 03/13/18   Hillary Bow, MD  FLUoxetine (PROZAC) 20  MG capsule Take by mouth daily. 05/10/18   [provider]  LORazepam (ATIVAN) 0.5 MG tablet Take 0.5 mg by mouth daily as needed for anxiety. Reported on 02/26/2016    [provider]  meclizine (ANTIVERT) 25 MG tablet Take 1 tablet by mouth 3 (three) times daily. 03/06/18   [provider]  tiZANidine (ZANAFLEX) 4 MG tablet Take 4 mg by mouth every 8 (eight) hours as needed.  02/16/16   [provider]  traMADol-acetaminophen (ULTRACET) 37.5-325 MG tablet TAKE 1 TABLET EVERY 8 HOURS AS NEEDED 03/18/17   [provider]  traZODone (DESYREL) 150  MG tablet Take 150 mg by mouth at bedtime.     [provider]    Allergies Patient has no known allergies.    Social History Social History   Tobacco Use  . Smoking status: Current Every Day Smoker    Packs/day: 2.00    Years: 53.00    Pack years: 106.00    Types: Cigarettes  . Smokeless tobacco: Never Used  Substance Use Topics  . Alcohol use: Not Currently    Alcohol/week: 0.0 standard drinks  . Drug use: No    Types: Heroin, Cocaine, Marijuana    Review of Systems Patient denies headaches, rhinorrhea, blurry vision, numbness, shortness of breath, chest pain, edema, cough, abdominal pain, nausea, vomiting, diarrhea, dysuria, fevers, rashes or hallucinations unless otherwise stated above in HPI. ____________________________________________   PHYSICAL EXAM:  VITAL SIGNS: Vitals:   06/03/18 2200 06/03/18 2313  BP: 112/66 (!) 168/77  Pulse: 67 60  Resp:    Temp:    SpO2:  98%    Constitutional: Alert and oriented.  Eyes: Conjunctivae are normal.  Head: Atraumatic. Nose: No congestion/rhinnorhea. Mouth/Throat: Mucous membranes are moist.   Neck: No stridor. Painless ROM.  Cardiovascular: Normal rate, regular rhythm. Grossly normal heart sounds.  Good peripheral circulation. Respiratory: Normal respiratory effort.  No retractions. Lungs CTAB. Gastrointestinal: Soft and nontender. No distention. No abdominal bruits. No CVA tenderness. Genitourinary:  Musculoskeletal: No lower extremity tenderness nor edema.  No joint effusions. Neurologic:  Normal speech and language. No gross focal neurologic deficits are appreciated. No facial droop Skin:  Skin is warm, dry and intact. No rash noted. Psychiatric: Mood and affect are normal. Speech and behavior are normal.  ____________________________________________   LABS (all labs ordered are listed, but only abnormal results are displayed)  Results for orders placed or performed during the hospital encounter of  06/03/18 (from the past 24 hour(s))  Basic metabolic panel     Status: Abnormal   Collection Time: 06/03/18  6:31 PM  Result Value Ref Range   Sodium 138 135 - 145 mmol/L   Potassium 4.2 3.5 - 5.1 mmol/L   Chloride 104 98 - 111 mmol/L   CO2 23 22 - 32 mmol/L   Glucose, Bld 173 (H) 70 - 99 mg/dL   BUN 21 8 - 23 mg/dL   Creatinine, Ser 0.85 0.61 - 1.24 mg/dL   Calcium 9.2 8.9 - 10.3 mg/dL   GFR calc non Af Amer >60 >60 mL/min   GFR calc Af Amer >60 >60 mL/min   Anion gap 11 5 - 15  CBC     Status: Abnormal   Collection Time: 06/03/18  6:31 PM  Result Value Ref Range   WBC 12.0 (H) 3.8 - 10.6 K/uL   RBC 4.26 (L) 4.40 - 5.90 MIL/uL   Hemoglobin 13.5 13.0 - 18.0 g/dL   HCT 40.0 40.0 -  52.0 %   MCV 94.0 80.0 - 100.0 fL   MCH 31.8 26.0 - 34.0 pg   MCHC 33.8 32.0 - 36.0 g/dL   RDW 14.9 (H) 11.5 - 14.5 %   Platelets 233 150 - 440 K/uL  Urinalysis, Complete w Microscopic     Status: Abnormal   Collection Time: 06/03/18  6:31 PM  Result Value Ref Range   Color, Urine AMBER (A) YELLOW   APPearance HAZY (A) CLEAR   Specific Gravity, Urine 1.027 1.005 - 1.030   pH 6.0 5.0 - 8.0   Glucose, UA NEGATIVE NEGATIVE mg/dL   Hgb urine dipstick LARGE (A) NEGATIVE   Bilirubin Urine NEGATIVE NEGATIVE   Ketones, ur 20 (A) NEGATIVE mg/dL   Protein, ur 100 (A) NEGATIVE mg/dL   Nitrite NEGATIVE NEGATIVE   Leukocytes, UA NEGATIVE NEGATIVE   RBC / HPF >50 (H) 0 - 5 RBC/hpf   WBC, UA 6-10 0 - 5 WBC/hpf   Bacteria, UA NONE SEEN NONE SEEN   Squamous Epithelial / LPF 0-5 0 - 5   Mucus PRESENT    Hyaline Casts, UA PRESENT   Troponin I     Status: None   Collection Time: 06/03/18 10:26 PM  Result Value Ref Range   Troponin I <0.03 <0.03 ng/mL   ____________________________________________  EKG My review and personal interpretation at Time: 18:29   Indication: near syncope  Rate: 65  Rhythm: sinus Axis: normal Other: nonspecific st abn, no  stemi ____________________________________________  RADIOLOGY  I personally reviewed all radiographic images ordered to evaluate for the above acute complaints and reviewed radiology reports and findings.  These findings were personally discussed with the patient.  Please see medical record for radiology report.  ____________________________________________   PROCEDURES  Procedure(s) performed:  Procedures    Critical Care performed: no ____________________________________________   INITIAL IMPRESSION / ASSESSMENT AND PLAN / ED COURSE  Pertinent labs & imaging results that were available during my care of the patient were reviewed by me and considered in my medical decision making (see chart for details).   DDX: Dehydration, dysrhythmia, lecture light abnormality, anemia, CVA, head injury, subdural hematoma, hypoglycemia  KADIEN LINEMAN is a 72 y.o. who presents to the ED with symptoms as described above.  Patient initially with mildly low blood pressure.  This improved with IV fluids and hydration.  Denies any chest pain or pressure.  EKG with nonspecific changes but no end enzyme elevation after gradient 6 hours after onset.  Borderline leukocytosis without evidence of infectious process.  CT head shows no evidence of acute intracranial abnormality.  No evidence of CHF.  Patient appears well-hydrated well-perfused.  He is ambulating with a steady gait.  Symptoms brief and did not recur after observation in the ER for over 5 hours.  Did offer observation in the hospital given his near syncopal event and age the patient feels comfortable and prefer to go home which seems reasonable given his reassuring work-up here in the ER.  Discussed signs and symptoms for which she should return immediately to hospital.      As part of my medical decision making, I reviewed the following data within the [facility] notes reviewed and incorporated, Labs reviewed, notes from  prior ED visits.  ____________________________________________   FINAL CLINICAL IMPRESSION(S) / ED DIAGNOSES  Final diagnoses:  Dizziness  Near syncope  Fall, initial encounter  Dehydration      NEW MEDICATIONS STARTED DURING THIS VISIT:  Discharge Medication  List as of 06/03/2018 11:26 PM       Note:  This document was prepared using Dragon voice recognition software and may include unintentional dictation errors.    Merlyn Lot, MD 06/04/18 [phone]

## 2018-06-24 ENCOUNTER — Ambulatory Visit
Admission: RE | Admit: 2018-06-24 | Discharge: 2018-06-24 | Disposition: A | Discharge status: Home / Self Care | Payer: Medicare HMO | Source: Ambulatory Visit | Attending: Urology | Admitting: Urology

## 2018-06-24 DIAGNOSIS — N200 Calculus of kidney: Secondary | ICD-10-CM | POA: Diagnosis not present

## 2018-06-24 DIAGNOSIS — M879 Osteonecrosis, unspecified: Secondary | ICD-10-CM | POA: Insufficient documentation

## 2018-06-24 DIAGNOSIS — I700 Atherosclerosis of aorta: Secondary | ICD-10-CM | POA: Diagnosis not present

## 2018-06-24 DIAGNOSIS — B182 Chronic viral hepatitis C: Secondary | ICD-10-CM | POA: Diagnosis not present

## 2018-06-24 DIAGNOSIS — R3129 Other microscopic hematuria: Secondary | ICD-10-CM | POA: Insufficient documentation

## 2018-06-24 MED ORDER — IOPAMIDOL (ISOVUE-300) INJECTION 61%
125.0000 mL | Freq: Once | INTRAVENOUS | Status: AC | PRN
  Administered 2018-06-24: 125 mL via INTRAVENOUS

## 2018-06-29 ENCOUNTER — Encounter: Payer: Self-pay | Admitting: Urology

## 2018-06-29 ENCOUNTER — Ambulatory Visit (INDEPENDENT_AMBULATORY_CARE_PROVIDER_SITE_OTHER): Payer: Medicare HMO | Admitting: Urology

## 2018-06-29 VITALS — BP 131/78 | HR 74 | Ht 62.0 in | Wt 139.4 lb

## 2018-06-29 DIAGNOSIS — R3129 Other microscopic hematuria: Secondary | ICD-10-CM

## 2018-06-29 MED ORDER — LIDOCAINE HCL URETHRAL/MUCOSAL 2 % EX GEL: 1.0000 "application " | Freq: Once | CUTANEOUS | Status: DC

## 2018-06-30 LAB — URINALYSIS, COMPLETE
Bilirubin, UA: NEGATIVE
GLUCOSE, UA: NEGATIVE
Ketones, UA: NEGATIVE
Leukocytes, UA: NEGATIVE
NITRITE UA: NEGATIVE
Specific Gravity, UA: >1.03 — ABNORMAL HIGH (ref 1.005–1.030)
UUROB: 0.2 mg/dL (ref 0.2–1.0)
pH, UA: 5 (ref 5.0–7.5)

## 2018-06-30 LAB — MICROSCOPIC EXAMINATION

## 2018-07-01 ENCOUNTER — Ambulatory Visit
Admission: RE | Admit: 2018-07-01 | Discharge: 2018-07-01 | Disposition: A | Discharge status: Home / Self Care | Payer: Medicare HMO | Source: Ambulatory Visit | Attending: Urology | Admitting: Urology

## 2018-07-01 ENCOUNTER — Other Ambulatory Visit: Payer: Self-pay

## 2018-07-01 DIAGNOSIS — R3129 Other microscopic hematuria: Secondary | ICD-10-CM | POA: Insufficient documentation

## 2018-07-01 DIAGNOSIS — N200 Calculus of kidney: Secondary | ICD-10-CM | POA: Diagnosis not present

## 2018-07-02 ENCOUNTER — Encounter: Payer: Self-pay | Admitting: Urology

## 2018-07-02 ENCOUNTER — Telehealth: Payer: Self-pay | Admitting: Radiology

## 2018-07-02 NOTE — Telephone Encounter (Signed)
Appointment made. Patient aware. 

## 2018-07-02 NOTE — Telephone Encounter (Signed)
He has a dense left renal pelvic stone and multiple left renal calculi.  His best option would be ureteroscopy.  Would recommend a follow-up appointment to discuss the procedure and schedule.

## 2018-07-02 NOTE — Telephone Encounter (Signed)
Patient requests call back regarding KUB performed 07/01/2018 & plan for possible surgery. Please advise. Patient may be reached at [phone].

## 2018-07-02 NOTE — Progress Notes (Signed)
   07/02/18  CC:  Chief Complaint  Patient presents with  . Cysto   Indications: Microhematuria  HPI: 72 year old male who saw [name] on 8/1 for microscopic hematuria.  He has a previous history of stone disease.  He has no complaints today.  CTU was remarkable for bilateral nephrolithiasis.  The largest stone was in the right lower pole measuring 8 mm.  There was a 5 mm calculus at the right UPJ and a left renal pelvic stone measuring 8 mm.  No hydronephrosis was noted.  There were several small lesions in both kidneys consistent with cyst.  A 10 mm left renal lesion was felt consistent with a minimally complex cyst.  Blood pressure 131/78, pulse 74, height 5\' 2"  (1.575 m), weight 139 lb 6.4 oz (63.2 kg). NED. A&Ox3.    Cystoscopy Procedure Note  Patient identification was confirmed, informed consent was obtained, and patient was prepped using Betadine solution.  Lidocaine jelly was administered per urethral meatus.    Preoperative abx where received prior to procedure.     Pre-Procedure: - Inspection reveals a normal caliber ureteral meatus.  Procedure: The flexible cystoscope was introduced without difficulty - No urethral strictures/lesions are present. - Moderate enlargement prostate  - Moderate elevation bladder neck - Bilateral ureteral orifices identified - Bladder mucosa  reveals no ulcers, tumors, or lesions - No bladder stones - No trabeculation  Retroflexion shows no intravesical median lobe   Post-Procedure: - Patient tolerated the procedure well  Assessment/ Plan: No significant lower tract abnormalities on cystoscopy.  He has bilateral nephrolithiasis and bilateral renal pelvic calculi which is the most likely source of his microhematuria.  Would recommend treatment for renal pelvic stones.  We discussed potential treatment options including ureteroscopy and shockwave lithotripsy.  A KUB was ordered to see if the calculi are visualized on plain film.   He will follow-up for further discussion of treatment options.

## 2018-07-08 ENCOUNTER — Ambulatory Visit (INDEPENDENT_AMBULATORY_CARE_PROVIDER_SITE_OTHER): Payer: Medicare HMO | Admitting: Urology

## 2018-07-08 ENCOUNTER — Other Ambulatory Visit: Payer: Self-pay | Admitting: Radiology

## 2018-07-08 ENCOUNTER — Encounter: Payer: Self-pay | Admitting: Urology

## 2018-07-08 VITALS — BP 171/76 | HR 87 | Ht 62.0 in | Wt 140.2 lb

## 2018-07-08 DIAGNOSIS — N200 Calculus of kidney: Secondary | ICD-10-CM

## 2018-07-08 NOTE — Progress Notes (Signed)
07/08/2018 8:41 AM   [name] 1946/05/16 [id number]  Referring provider: Tracie Harrier, [place] [place], [place] [address]  Chief Complaint  Patient presents with  . Pain  . Follow-up    HPI: 72 year old male recently seen for microhematuria.  CT urogram showed bilateral nephrolithiasis.  He had nonobstructing bilateral renal pelvic calculi approximately 7 mm on the left and 5 mm on the right.  We discussed treatment options at the time of his cystoscopy and a KUB was obtained.  The right renal pelvic calculi had migrated to the lower pole.  The left renal pelvic calculi had not changed position.  He is asymptomatic.  He complains of generalized lower abdominal discomfort which is mild.  He presents today to discuss treatment options.  He did have a CVA in May 2019 and is on Plavix.   PMH: Past Medical History:  Diagnosis Date  . Anxiety   . Arthritis   . Bell's palsy    hx of, right side  . Depression    hx of depression  . Hepatitis    hx of hepatitis C  . Hypertension   . Kidney stone    hx of  . Substance abuse [facility])    heroin    Surgical History: Past Surgical History:  Procedure Laterality Date  . ANTERIOR LUMBAR FUSION  05/13/2012   Procedure: ANTERIOR LUMBAR FUSION 1 LEVEL;  Surgeon: Melina Schools, MD;  Location: [facility];  Service: Orthopedics;  Laterality: N/A;  ALIF L5-S1  . CHOLECYSTECTOMY    . KIDNEY STONE SURGERY    . LUMBAR FUSION  05/13/2012    Home Medications:  Allergies as of 07/08/2018   No Known Allergies     Medication List        Accurate as of 07/08/18  8:41 AM. Always use your most recent med list.          aspirin EC 81 MG tablet Take 1 tablet (81 mg total) by mouth daily.   atorvastatin 40 MG tablet Commonly known as:  LIPITOR Take 1 tablet (40 mg total) by mouth daily at 6 PM.   cloNIDine 0.1 MG tablet Commonly known as:  CATAPRES Take 0.1 mg by mouth 2 (two) times daily.     clopidogrel 75 MG tablet Commonly known as:  PLAVIX Take 1 tablet (75 mg total) by mouth daily.   FLUoxetine 20 MG capsule Commonly known as:  PROZAC Take by mouth daily.   LORazepam 0.5 MG tablet Commonly known as:  ATIVAN Take 0.5 mg by mouth daily as needed for anxiety. Reported on 02/26/2016   meclizine 25 MG tablet Commonly known as:  ANTIVERT Take 1 tablet by mouth 3 (three) times daily.   oxyCODONE-acetaminophen 5-325 MG tablet Commonly known as:  PERCOCET/ROXICET TAKE ONE TABLET BY MOUTH TWO TIMES DAILY AS NEEDED FOR PAIN   tiZANidine 4 MG tablet Commonly known as:  ZANAFLEX Take 4 mg by mouth every 8 (eight) hours as needed.   traMADol-acetaminophen 37.5-325 MG tablet Commonly known as:  ULTRACET TAKE 1 TABLET EVERY 8 HOURS AS NEEDED   traZODone 150 MG tablet Commonly known as:  DESYREL Take 150 mg by mouth at bedtime.       Allergies: No Known Allergies  Family History: Family History  Problem Relation Age of Onset  . Stroke Mother   . Lung cancer Father     Social History:  reports that he has been smoking cigarettes. He has  a 106.00 pack-year smoking history. He has never used smokeless tobacco. He reports that he drank alcohol. He reports that he does not use drugs.  ROS: UROLOGY Frequent Urination?: No Hard to postpone urination?: No Burning/pain with urination?: No Get up at night to urinate?: No Leakage of urine?: No Urine stream starts and stops?: No Trouble starting stream?: No Do you have to strain to urinate?: No Blood in urine?: No Urinary tract infection?: No Sexually transmitted disease?: No Injury to kidneys or bladder?: No Painful intercourse?: No Weak stream?: No Erection problems?: No Penile pain?: No  Gastrointestinal Nausea?: Yes Vomiting?: No Indigestion/heartburn?: No Diarrhea?: No Constipation?: No  Constitutional Fever: No Night sweats?: No Weight loss?: Yes Fatigue?: No  Skin Skin rash/lesions?:  No Itching?: No  Eyes Blurred vision?: Yes Double vision?: No  Ears/Nose/Throat Sore throat?: No Sinus problems?: No  Hematologic/Lymphatic Swollen glands?: No Easy bruising?: No  Cardiovascular Leg swelling?: No Chest pain?: No  Respiratory Cough?: No Shortness of breath?: No  Endocrine Excessive thirst?: No  Musculoskeletal Back pain?: Yes Joint pain?: No  Neurological Headaches?: No Dizziness?: No  Psychologic Depression?: No Anxiety?: Yes  Physical Exam: BP (!) 171/76 (BP Location: Left Arm, Patient Position: Sitting, Cuff Size: Normal)   Pulse 87   Ht 5\' 2"  (1.575 m)   Wt 140 lb 3.2 oz (63.6 kg)   BMI 25.64 kg/m   Constitutional:  Alert and oriented, No acute distress. HEENT: [place] AT, moist mucus membranes.  Trachea midline, no masses. Cardiovascular: No clubbing, cyanosis, or edema.  RRR Respiratory: Normal respiratory effort, no increased work of breathing.  Lungs clear GI: Abdomen is soft, nontender, nondistended, no abdominal masses GU: No CVA tenderness Lymph: No cervical or inguinal lymphadenopathy. Skin: No rashes, bruises or suspicious lesions. Neurologic: Grossly intact, no focal deficits, moving all 4 extremities. Psychiatric: Normal mood and affect.   Assessment & Plan:   72 year old male with bilateral nephrolithiasis.  He has a 7 mm left renal pelvic calculus along with left nephrolithiasis and right lower pole calculi.  The previously seen right renal calculus was in the renal pelvis.  Based on renal pelvic/prior renal pelvic location treatment is recommended.  We discussed options of shockwave lithotripsy and ureteroscopy.  He was informed that it is unlikely all of the stones and one kidney could be treated with shockwave lithotripsy however could be treated with ureteroscopy.  We also discussed bilateral ureteroscopy.  After discussing options he has elected unilateral left ureteroscopy and follow-up right ureteroscopy at a later  date.  He has diffuse mild lower abdominal pain and was informed it is unlikely this is secondary to his stone and this would probably not resolve.  It may be GI in etiology.  Will obtain clearance from his PCP regarding his recent CVA and whether Plavix can be discontinued preprocedure.  The indications and nature of the planned procedure were discussed as well as the potential  benefits and expected outcome.  Alternatives have been discussed in detail. The most common complications and side effects were discussed including but not limited to infection/sepsis; blood loss; damage to urethra, bladder, ureter, kidney; need for multiple surgeries; need for prolonged stent placement as well as general anesthesia risks. Although uncommon he was also informed of the possibility that the calculus may not be able to be treated due to inability to obtain access to the upper ureter. In that event she would require stent placement and a follow-up procedure after a period of stent dilation. All of  his questions were answered and he desires to proceed.    Abbie Sons, [place] [address] [place], [place] [address] [phone]

## 2018-07-08 NOTE — H&P (View-Only) (Signed)
07/08/2018 8:41 AM   [name] 1945-12-02 [id number]  Referring provider: Tracie Harrier, [facility] [place], [place] [address]  Chief Complaint  Patient presents with  . Pain  . Follow-up    HPI: 72 year old male recently seen for microhematuria.  CT urogram showed bilateral nephrolithiasis.  He had nonobstructing bilateral renal pelvic calculi approximately 7 mm on the left and 5 mm on the right.  We discussed treatment options at the time of his cystoscopy and a KUB was obtained.  The right renal pelvic calculi had migrated to the lower pole.  The left renal pelvic calculi had not changed position.  He is asymptomatic.  He complains of generalized lower abdominal discomfort which is mild.  He presents today to discuss treatment options.  He did have a CVA in May 2019 and is on Plavix.   PMH: Past Medical History:  Diagnosis Date  . Anxiety   . Arthritis   . Bell's palsy    hx of, right side  . Depression    hx of depression  . Hepatitis    hx of hepatitis C  . Hypertension   . Kidney stone    hx of  . Substance abuse [facility])    heroin    Surgical History: Past Surgical History:  Procedure Laterality Date  . ANTERIOR LUMBAR FUSION  05/13/2012   Procedure: ANTERIOR LUMBAR FUSION 1 LEVEL;  Surgeon: Melina Schools, MD;  Location: [facility];  Service: Orthopedics;  Laterality: N/A;  ALIF L5-S1  . CHOLECYSTECTOMY    . KIDNEY STONE SURGERY    . LUMBAR FUSION  05/13/2012    Home Medications:  Allergies as of 07/08/2018   No Known Allergies     Medication List        Accurate as of 07/08/18  8:41 AM. Always use your most recent med list.          aspirin EC 81 MG tablet Take 1 tablet (81 mg total) by mouth daily.   atorvastatin 40 MG tablet Commonly known as:  LIPITOR Take 1 tablet (40 mg total) by mouth daily at 6 PM.   cloNIDine 0.1 MG tablet Commonly known as:  CATAPRES Take 0.1 mg by mouth 2 (two) times daily.     clopidogrel 75 MG tablet Commonly known as:  PLAVIX Take 1 tablet (75 mg total) by mouth daily.   FLUoxetine 20 MG capsule Commonly known as:  PROZAC Take by mouth daily.   LORazepam 0.5 MG tablet Commonly known as:  ATIVAN Take 0.5 mg by mouth daily as needed for anxiety. Reported on 02/26/2016   meclizine 25 MG tablet Commonly known as:  ANTIVERT Take 1 tablet by mouth 3 (three) times daily.   oxyCODONE-acetaminophen 5-325 MG tablet Commonly known as:  PERCOCET/ROXICET TAKE ONE TABLET BY MOUTH TWO TIMES DAILY AS NEEDED FOR PAIN   tiZANidine 4 MG tablet Commonly known as:  ZANAFLEX Take 4 mg by mouth every 8 (eight) hours as needed.   traMADol-acetaminophen 37.5-325 MG tablet Commonly known as:  ULTRACET TAKE 1 TABLET EVERY 8 HOURS AS NEEDED   traZODone 150 MG tablet Commonly known as:  DESYREL Take 150 mg by mouth at bedtime.       Allergies: No Known Allergies  Family History: Family History  Problem Relation Age of Onset  . Stroke Mother   . Lung cancer Father     Social History:  reports that he has been smoking cigarettes. He has  a 106.00 pack-year smoking history. He has never used smokeless tobacco. He reports that he drank alcohol. He reports that he does not use drugs.  ROS: UROLOGY Frequent Urination?: No Hard to postpone urination?: No Burning/pain with urination?: No Get up at night to urinate?: No Leakage of urine?: No Urine stream starts and stops?: No Trouble starting stream?: No Do you have to strain to urinate?: No Blood in urine?: No Urinary tract infection?: No Sexually transmitted disease?: No Injury to kidneys or bladder?: No Painful intercourse?: No Weak stream?: No Erection problems?: No Penile pain?: No  Gastrointestinal Nausea?: Yes Vomiting?: No Indigestion/heartburn?: No Diarrhea?: No Constipation?: No  Constitutional Fever: No Night sweats?: No Weight loss?: Yes Fatigue?: No  Skin Skin rash/lesions?:  No Itching?: No  Eyes Blurred vision?: Yes Double vision?: No  Ears/Nose/Throat Sore throat?: No Sinus problems?: No  Hematologic/Lymphatic Swollen glands?: No Easy bruising?: No  Cardiovascular Leg swelling?: No Chest pain?: No  Respiratory Cough?: No Shortness of breath?: No  Endocrine Excessive thirst?: No  Musculoskeletal Back pain?: Yes Joint pain?: No  Neurological Headaches?: No Dizziness?: No  Psychologic Depression?: No Anxiety?: Yes  Physical Exam: BP (!) 171/76 (BP Location: Left Arm, Patient Position: Sitting, Cuff Size: Normal)   Pulse 87   Ht 5\' 2"  (1.575 m)   Wt 140 lb 3.2 oz (63.6 kg)   BMI 25.64 kg/m   Constitutional:  Alert and oriented, No acute distress. HEENT: [place] AT, moist mucus membranes.  Trachea midline, no masses. Cardiovascular: No clubbing, cyanosis, or edema.  RRR Respiratory: Normal respiratory effort, no increased work of breathing.  Lungs clear GI: Abdomen is soft, nontender, nondistended, no abdominal masses GU: No CVA tenderness Lymph: No cervical or inguinal lymphadenopathy. Skin: No rashes, bruises or suspicious lesions. Neurologic: Grossly intact, no focal deficits, moving all 4 extremities. Psychiatric: Normal mood and affect.   Assessment & Plan:   72 year old male with bilateral nephrolithiasis.  He has a 7 mm left renal pelvic calculus along with left nephrolithiasis and right lower pole calculi.  The previously seen right renal calculus was in the renal pelvis.  Based on renal pelvic/prior renal pelvic location treatment is recommended.  We discussed options of shockwave lithotripsy and ureteroscopy.  He was informed that it is unlikely all of the stones and one kidney could be treated with shockwave lithotripsy however could be treated with ureteroscopy.  We also discussed bilateral ureteroscopy.  After discussing options he has elected unilateral left ureteroscopy and follow-up right ureteroscopy at a later  date.  He has diffuse mild lower abdominal pain and was informed it is unlikely this is secondary to his stone and this would probably not resolve.  It may be GI in etiology.  Will obtain clearance from his PCP regarding his recent CVA and whether Plavix can be discontinued preprocedure.  The indications and nature of the planned procedure were discussed as well as the potential  benefits and expected outcome.  Alternatives have been discussed in detail. The most common complications and side effects were discussed including but not limited to infection/sepsis; blood loss; damage to urethra, bladder, ureter, kidney; need for multiple surgeries; need for prolonged stent placement as well as general anesthesia risks. Although uncommon he was also informed of the possibility that the calculus may not be able to be treated due to inability to obtain access to the upper ureter. In that event she would require stent placement and a follow-up procedure after a period of stent dilation. All of  his questions were answered and he desires to proceed.    Abbie Sons, [place] [address] [place],  [id number] [phone]

## 2018-07-23 DIAGNOSIS — M961 Postlaminectomy syndrome, not elsewhere classified: Secondary | ICD-10-CM | POA: Insufficient documentation

## 2018-07-28 ENCOUNTER — Other Ambulatory Visit: Payer: Self-pay

## 2018-07-28 ENCOUNTER — Encounter
Admission: RE | Admit: 2018-07-28 | Discharge: 2018-07-28 | Disposition: A | Discharge status: Home / Self Care | Payer: Medicare HMO | Source: Ambulatory Visit | Attending: Urology | Admitting: Urology

## 2018-07-28 HISTORY — DX: Cerebral infarction, unspecified: I63.9

## 2018-07-28 HISTORY — DX: Personal history of urinary calculi: Z87.442

## 2018-07-28 HISTORY — DX: Gastro-esophageal reflux disease without esophagitis: K21.9

## 2018-07-28 NOTE — Patient Instructions (Signed)
Your procedure is scheduled on: Tuesday, August 04, 2018 Report to Day Surgery on the 2nd floor of the [facility]. To find out your arrival time, please call [phone] between 1PM - 3PM on: Monday, August 03, 2018  REMEMBER: Instructions that are not followed completely may result in serious medical risk, up to and including death; or upon the discretion of your surgeon and anesthesiologist your surgery may need to be rescheduled.  Do not eat food after midnight the night before surgery.  No gum chewing, lozengers or hard candies.  You may however, drink CLEAR liquids up to 2 hours before you are scheduled to arrive for your surgery. Do not drink anything within 2 hours of the start of your surgery.  Clear liquids include: - water  - apple juice without pulp - gatorade - black coffee or tea (Do NOT add milk or creamers to the coffee or tea) Do NOT drink anything that is not on this list.  No Alcohol for 24 hours before or after surgery.  No Smoking including e-cigarettes for 24 hours prior to surgery.  No chewable tobacco products for at least 6 hours prior to surgery.  No nicotine patches on the day of surgery.  On the morning of surgery brush your teeth with toothpaste and water, you may rinse your mouth with mouthwash if you wish. Do not swallow any toothpaste or mouthwash.  Notify your doctor if there is any change in your medical condition (cold, fever, infection).  Do not wear jewelry, make-up, hairpins, clips or nail polish.  Do not wear lotions, powders, or perfumes. You may wear deodorant.  Do not shave 48 hours prior to surgery. Men may shave face and neck.  Contacts and dentures may not be worn into surgery.  Do not bring valuables to the hospital, including drivers license, insurance or credit cards.  [facility] is not responsible for any belongings or valuables.   TAKE THESE MEDICATIONS THE MORNING OF SURGERY:  1.  CLONIDINE 2.  TRAMADOL (IF NEEDED  FOR PAIN)  Follow recommendations from Cardiologist, or PCP regarding stopping Aspirin and Plavix. STOP 7 DAYS PRIOR TO SURGERY; WHICH IS TODAY!!  NOW!  Stop Anti-inflammatories (NSAIDS) such as Advil, Aleve, Ibuprofen, Motrin, Naproxen, Naprosyn and Aspirin based products such as Excedrin, Goodys Powder, BC Powder. (May take Tylenol or Acetaminophen if needed.)  Stop ANY OVER THE COUNTER supplements until after surgery. (May continue Vitamin B.)  Wear comfortable clothing (specific to your surgery type) to the hospital.  If you are being discharged the day of surgery, you will not be allowed to drive home. You will need a responsible adult to drive you home and stay with you that night.   If you are taking public transportation, you will need to have a responsible adult with you. Please confirm with your physician that it is acceptable to use public transportation.   Please call [phone] if you have any questions about these instructions.

## 2018-07-29 ENCOUNTER — Ambulatory Visit
Admission: RE | Admit: 2018-07-29 | Discharge: 2018-07-29 | Disposition: A | Discharge status: Home / Self Care | Payer: Medicare HMO | Source: Ambulatory Visit | Attending: Urology | Admitting: Urology

## 2018-07-29 DIAGNOSIS — I10 Essential (primary) hypertension: Secondary | ICD-10-CM | POA: Insufficient documentation

## 2018-07-29 DIAGNOSIS — Z5309 Procedure and treatment not carried out because of other contraindication: Secondary | ICD-10-CM | POA: Insufficient documentation

## 2018-07-29 DIAGNOSIS — N200 Calculus of kidney: Secondary | ICD-10-CM | POA: Insufficient documentation

## 2018-07-29 DIAGNOSIS — M1990 Unspecified osteoarthritis, unspecified site: Secondary | ICD-10-CM | POA: Insufficient documentation

## 2018-07-29 DIAGNOSIS — F17200 Nicotine dependence, unspecified, uncomplicated: Secondary | ICD-10-CM | POA: Diagnosis not present

## 2018-07-29 DIAGNOSIS — K219 Gastro-esophageal reflux disease without esophagitis: Secondary | ICD-10-CM | POA: Diagnosis not present

## 2018-07-29 DIAGNOSIS — F418 Other specified anxiety disorders: Secondary | ICD-10-CM | POA: Insufficient documentation

## 2018-07-30 LAB — URINE CULTURE
Culture: NO GROWTH
Special Requests: NORMAL

## 2018-08-03 MED ORDER — CEFAZOLIN SODIUM-DEXTROSE 2-4 GM/100ML-% IV SOLN
2.0000 g | INTRAVENOUS | Status: AC
  Administered 2018-08-04: 2 g via INTRAVENOUS

## 2018-08-04 ENCOUNTER — Ambulatory Visit
Admission: RE | Admit: 2018-08-04 | Discharge: 2018-08-04 | Disposition: A | Discharge status: Home / Self Care | Payer: Medicare HMO | Source: Ambulatory Visit | Attending: Urology | Admitting: Urology

## 2018-08-04 ENCOUNTER — Ambulatory Visit: Payer: Medicare HMO | Admitting: Certified Registered Nurse Anesthetist

## 2018-08-04 ENCOUNTER — Encounter: Payer: Self-pay | Admitting: Urology

## 2018-08-04 ENCOUNTER — Encounter
Admission: RE | Disposition: A | Discharge status: Home / Self Care | Payer: Self-pay | Source: Ambulatory Visit | Attending: Urology

## 2018-08-04 DIAGNOSIS — F1110 Opioid abuse, uncomplicated: Secondary | ICD-10-CM | POA: Insufficient documentation

## 2018-08-04 DIAGNOSIS — Z7982 Long term (current) use of aspirin: Secondary | ICD-10-CM | POA: Diagnosis not present

## 2018-08-04 DIAGNOSIS — B1920 Unspecified viral hepatitis C without hepatic coma: Secondary | ICD-10-CM | POA: Diagnosis not present

## 2018-08-04 DIAGNOSIS — Z823 Family history of stroke: Secondary | ICD-10-CM | POA: Diagnosis not present

## 2018-08-04 DIAGNOSIS — F17210 Nicotine dependence, cigarettes, uncomplicated: Secondary | ICD-10-CM | POA: Insufficient documentation

## 2018-08-04 DIAGNOSIS — G510 Bell's palsy: Secondary | ICD-10-CM | POA: Insufficient documentation

## 2018-08-04 DIAGNOSIS — F329 Major depressive disorder, single episode, unspecified: Secondary | ICD-10-CM | POA: Insufficient documentation

## 2018-08-04 DIAGNOSIS — I10 Essential (primary) hypertension: Secondary | ICD-10-CM | POA: Insufficient documentation

## 2018-08-04 DIAGNOSIS — Z8673 Personal history of transient ischemic attack (TIA), and cerebral infarction without residual deficits: Secondary | ICD-10-CM | POA: Insufficient documentation

## 2018-08-04 DIAGNOSIS — Z981 Arthrodesis status: Secondary | ICD-10-CM | POA: Insufficient documentation

## 2018-08-04 DIAGNOSIS — M1990 Unspecified osteoarthritis, unspecified site: Secondary | ICD-10-CM | POA: Insufficient documentation

## 2018-08-04 DIAGNOSIS — F419 Anxiety disorder, unspecified: Secondary | ICD-10-CM | POA: Diagnosis not present

## 2018-08-04 DIAGNOSIS — N200 Calculus of kidney: Secondary | ICD-10-CM | POA: Diagnosis not present

## 2018-08-04 DIAGNOSIS — Z7902 Long term (current) use of antithrombotics/antiplatelets: Secondary | ICD-10-CM | POA: Diagnosis not present

## 2018-08-04 DIAGNOSIS — Z87442 Personal history of urinary calculi: Secondary | ICD-10-CM | POA: Diagnosis not present

## 2018-08-04 DIAGNOSIS — Z9049 Acquired absence of other specified parts of digestive tract: Secondary | ICD-10-CM | POA: Insufficient documentation

## 2018-08-04 DIAGNOSIS — Z801 Family history of malignant neoplasm of trachea, bronchus and lung: Secondary | ICD-10-CM | POA: Diagnosis not present

## 2018-08-04 DIAGNOSIS — Z79899 Other long term (current) drug therapy: Secondary | ICD-10-CM | POA: Insufficient documentation

## 2018-08-04 DIAGNOSIS — R3129 Other microscopic hematuria: Secondary | ICD-10-CM | POA: Insufficient documentation

## 2018-08-04 HISTORY — PX: CYSTOSCOPY WITH STENT PLACEMENT: SHX5790

## 2018-08-04 HISTORY — PX: CYSTOSCOPY W/ RETROGRADES: SHX1426

## 2018-08-04 LAB — URINE DRUG SCREEN, QUALITATIVE (ARMC ONLY)
Amphetamines, Ur Screen: NOT DETECTED
BARBITURATES, UR SCREEN: NOT DETECTED
Benzodiazepine, Ur Scrn: NOT DETECTED
COCAINE METABOLITE, UR ~~LOC~~: NOT DETECTED
Cannabinoid 50 Ng, Ur ~~LOC~~: NOT DETECTED
MDMA (ECSTASY) UR SCREEN: NOT DETECTED
METHADONE SCREEN, URINE: NOT DETECTED
OPIATE, UR SCREEN: NOT DETECTED
Phencyclidine (PCP) Ur S: NOT DETECTED
Tricyclic, Ur Screen: POSITIVE — AB

## 2018-08-04 SURGERY — CYSTOSCOPY, WITH STENT INSERTION
Anesthesia: General | Site: Ureter | Laterality: Left

## 2018-08-04 MED ORDER — LIDOCAINE HCL (PF) 2 % IJ SOLN
INTRAMUSCULAR | Status: AC
  Filled 2018-08-04: qty 10

## 2018-08-04 MED ORDER — SUCCINYLCHOLINE CHLORIDE 20 MG/ML IJ SOLN
INTRAMUSCULAR | Status: AC
  Filled 2018-08-04: qty 1

## 2018-08-04 MED ORDER — DEXAMETHASONE SODIUM PHOSPHATE 10 MG/ML IJ SOLN
INTRAMUSCULAR | Status: DC | PRN
  Administered 2018-08-04: 5 mg via INTRAVENOUS

## 2018-08-04 MED ORDER — FENTANYL CITRATE (PF) 100 MCG/2ML IJ SOLN
INTRAMUSCULAR | Status: DC | PRN
  Administered 2018-08-04 (×2): 25 ug via INTRAVENOUS

## 2018-08-04 MED ORDER — IOPAMIDOL (ISOVUE-200) INJECTION 41%
INTRAVENOUS | Status: DC | PRN
  Administered 2018-08-04: 15 mL via INTRAVENOUS

## 2018-08-04 MED ORDER — OXYCODONE HCL 5 MG/5ML PO SOLN: 5.0000 mg | Freq: Once | ORAL | Status: AC | PRN

## 2018-08-04 MED ORDER — MIDAZOLAM HCL 2 MG/2ML IJ SOLN
INTRAMUSCULAR | Status: AC
  Filled 2018-08-04: qty 2

## 2018-08-04 MED ORDER — ROCURONIUM BROMIDE 50 MG/5ML IV SOLN
INTRAVENOUS | Status: AC
  Filled 2018-08-04: qty 1

## 2018-08-04 MED ORDER — PROMETHAZINE HCL 25 MG/ML IJ SOLN: 6.2500 mg | INTRAMUSCULAR | Status: DC | PRN

## 2018-08-04 MED ORDER — LACTATED RINGERS IV SOLN
INTRAVENOUS | Status: DC
  Administered 2018-08-04: 10:00:00 via INTRAVENOUS

## 2018-08-04 MED ORDER — MEPERIDINE HCL 50 MG/ML IJ SOLN: 6.2500 mg | INTRAMUSCULAR | Status: DC | PRN

## 2018-08-04 MED ORDER — PROPOFOL 10 MG/ML IV BOLUS
INTRAVENOUS | Status: AC
  Filled 2018-08-04: qty 20

## 2018-08-04 MED ORDER — FAMOTIDINE 20 MG PO TABS
ORAL_TABLET | ORAL | Status: AC
  Filled 2018-08-04: qty 1

## 2018-08-04 MED ORDER — OXYCODONE HCL 5 MG PO TABS
5.0000 mg | ORAL_TABLET | Freq: Once | ORAL | Status: AC | PRN
  Administered 2018-08-04: 5 mg via ORAL

## 2018-08-04 MED ORDER — FENTANYL CITRATE (PF) 100 MCG/2ML IJ SOLN
25.0000 ug | INTRAMUSCULAR | Status: DC | PRN
  Administered 2018-08-04 (×3): 50 ug via INTRAVENOUS

## 2018-08-04 MED ORDER — FENTANYL CITRATE (PF) 100 MCG/2ML IJ SOLN
INTRAMUSCULAR | Status: AC
  Filled 2018-08-04: qty 2

## 2018-08-04 MED ORDER — MIDAZOLAM HCL 2 MG/2ML IJ SOLN
INTRAMUSCULAR | Status: DC | PRN
  Administered 2018-08-04: 1 mg via INTRAVENOUS

## 2018-08-04 MED ORDER — DEXAMETHASONE SODIUM PHOSPHATE 10 MG/ML IJ SOLN
INTRAMUSCULAR | Status: AC
  Filled 2018-08-04: qty 1

## 2018-08-04 MED ORDER — FAMOTIDINE 20 MG PO TABS
20.0000 mg | ORAL_TABLET | Freq: Once | ORAL | Status: AC
  Administered 2018-08-04: 20 mg via ORAL

## 2018-08-04 MED ORDER — SUCCINYLCHOLINE CHLORIDE 20 MG/ML IJ SOLN
INTRAMUSCULAR | Status: DC | PRN
  Administered 2018-08-04: 100 mg via INTRAVENOUS

## 2018-08-04 MED ORDER — FENTANYL CITRATE (PF) 100 MCG/2ML IJ SOLN
INTRAMUSCULAR | Status: AC
  Administered 2018-08-04: 50 ug via INTRAVENOUS
  Filled 2018-08-04: qty 2

## 2018-08-04 MED ORDER — OXYCODONE HCL 5 MG PO TABS
ORAL_TABLET | ORAL | Status: AC
  Filled 2018-08-04: qty 1

## 2018-08-04 MED ORDER — ONDANSETRON HCL 4 MG/2ML IJ SOLN
INTRAMUSCULAR | Status: AC
  Filled 2018-08-04: qty 2

## 2018-08-04 MED ORDER — CEFAZOLIN SODIUM-DEXTROSE 2-4 GM/100ML-% IV SOLN
INTRAVENOUS | Status: AC
  Filled 2018-08-04: qty 100

## 2018-08-04 MED ORDER — LIDOCAINE HCL (CARDIAC) PF 100 MG/5ML IV SOSY
PREFILLED_SYRINGE | INTRAVENOUS | Status: DC | PRN
  Administered 2018-08-04: 80 mg via INTRAVENOUS

## 2018-08-04 MED ORDER — OXYBUTYNIN CHLORIDE 5 MG PO TABS: ORAL_TABLET | ORAL | 0 refills | Status: DC

## 2018-08-04 MED ORDER — ONDANSETRON HCL 4 MG/2ML IJ SOLN
INTRAMUSCULAR | Status: DC | PRN
  Administered 2018-08-04: 4 mg via INTRAVENOUS

## 2018-08-04 MED ORDER — ROCURONIUM BROMIDE 100 MG/10ML IV SOLN
INTRAVENOUS | Status: DC | PRN
  Administered 2018-08-04: 5 mg via INTRAVENOUS

## 2018-08-04 MED ORDER — PROPOFOL 10 MG/ML IV BOLUS
INTRAVENOUS | Status: DC | PRN
  Administered 2018-08-04: 150 mg via INTRAVENOUS

## 2018-08-04 MED ORDER — PHENYLEPHRINE HCL 10 MG/ML IJ SOLN
INTRAMUSCULAR | Status: DC | PRN
  Administered 2018-08-04: 100 ug via INTRAVENOUS

## 2018-08-04 SURGICAL SUPPLY — 30 items
BAG DRAIN CYSTO-URO LG1000N (MISCELLANEOUS) ×3 IMPLANT
BASKET ZERO TIP 1.9FR (BASKET) IMPLANT
BRUSH SCRUB EZ 1% IODOPHOR (MISCELLANEOUS) ×3 IMPLANT
CATH URETL 5X70 OPEN END (CATHETERS) IMPLANT
CNTNR SPEC 2.5X3XGRAD LEK (MISCELLANEOUS)
CONRAY 43 FOR UROLOGY 50M (MISCELLANEOUS) ×3 IMPLANT
CONT SPEC 4OZ STER OR WHT (MISCELLANEOUS)
CONTAINER SPEC 2.5X3XGRAD LEK (MISCELLANEOUS) IMPLANT
COVER WAND RF STERILE (DRAPES) ×3 IMPLANT
DRAPE UTILITY 15X26 TOWEL STRL (DRAPES) ×3 IMPLANT
FIBER LASER LITHO 273 (Laser) IMPLANT
GLOVE BIO SURGEON STRL SZ8 (GLOVE) ×3 IMPLANT
GOWN STRL REUS W/ TWL LRG LVL3 (GOWN DISPOSABLE) ×2 IMPLANT
GOWN STRL REUS W/ TWL XL LVL3 (GOWN DISPOSABLE) ×2 IMPLANT
GOWN STRL REUS W/TWL LRG LVL3 (GOWN DISPOSABLE) ×1
GOWN STRL REUS W/TWL XL LVL3 (GOWN DISPOSABLE) ×1
INFUSOR MANOMETER BAG 3000ML (MISCELLANEOUS) ×3 IMPLANT
INTRODUCER DILATOR DOUBLE (INTRODUCER) ×2 IMPLANT
KIT TURNOVER CYSTO (KITS) ×3 IMPLANT
PACK CYSTO AR (MISCELLANEOUS) ×3 IMPLANT
SENSORWIRE 0.038 NOT ANGLED (WIRE) ×3
SET CYSTO W/LG BORE CLAMP LF (SET/KITS/TRAYS/PACK) ×3 IMPLANT
SHEATH URETERAL 12FRX35CM (MISCELLANEOUS) IMPLANT
SOL .9 NS 3000ML IRR  AL (IV SOLUTION) ×1
SOL .9 NS 3000ML IRR UROMATIC (IV SOLUTION) ×2 IMPLANT
STENT URET 6FRX24 CONTOUR (STENTS) ×2 IMPLANT
STENT URET 6FRX26 CONTOUR (STENTS) IMPLANT
SURGILUBE 2OZ TUBE FLIPTOP (MISCELLANEOUS) ×3 IMPLANT
WATER STERILE IRR 1000ML POUR (IV SOLUTION) ×3 IMPLANT
WIRE SENSOR 0.038 NOT ANGLED (WIRE) ×2 IMPLANT

## 2018-08-04 NOTE — Anesthesia Preprocedure Evaluation (Signed)
Anesthesia Evaluation  Patient identified by MRN, date of birth, ID band Patient awake    Reviewed: Allergy & Precautions, NPO status , Patient's Chart, lab work & pertinent test results  History of Anesthesia Complications Negative for: history of anesthetic complications  Airway Mallampati: III  TM Distance: >3 FB Neck ROM: Full    Dental  (+) Loose, Missing, Poor Dentition,    Pulmonary neg sleep apnea, neg COPD, Current Smoker,    breath sounds clear to auscultation- rhonchi (-) wheezing      Cardiovascular hypertension, Pt. on medications (-) CAD, (-) Past MI, (-) Cardiac Stents and (-) CABG  Rhythm:Regular Rate:Normal - Systolic murmurs and - Diastolic murmurs    Neuro/Psych PSYCHIATRIC DISORDERS Anxiety Depression CVA (visual and balance problems)    GI/Hepatic GERD  ,(+) Hepatitis -, C  Endo/Other  negative endocrine ROSneg diabetes  Renal/GU Renal disease: hx of nephrolithiasis.     Musculoskeletal  (+) Arthritis ,   Abdominal (+) - obese,   Peds  Hematology negative hematology ROS (+)   Anesthesia Other Findings Past Medical History: No date: Anxiety No date: Arthritis No date: Bell's palsy     Comment:  hx of, right side No date: Depression     Comment:  hx of depression No date: GERD (gastroesophageal reflux disease) No date: Hepatitis     Comment:  hx of hepatitis C No date: History of kidney stones No date: Hypertension No date: Kidney stone     Comment:  hx of 02/2018: Stroke ([place])     Comment:  loss of peripheral vision and balance unsteady No date: Substance abuse ([place])     Comment:  heroin   Reproductive/Obstetrics                             Anesthesia Physical Anesthesia Plan  ASA: III  Anesthesia Plan: General   Post-op Pain Management:    Induction: Intravenous  PONV Risk Score and Plan: 0 and Ondansetron  Airway Management Planned: Oral  ETT  Additional Equipment:   Intra-op Plan:   Post-operative Plan: Extubation in OR  Informed Consent: I have reviewed the patients History and Physical, chart, labs and discussed the procedure including the risks, benefits and alternatives for the proposed anesthesia with the patient or authorized representative who has indicated his/her understanding and acceptance.   Dental advisory given  Plan Discussed with: CRNA and Anesthesiologist  Anesthesia Plan Comments:         Anesthesia Quick Evaluation

## 2018-08-04 NOTE — Transfer of Care (Signed)
Immediate Anesthesia Transfer of Care Note  Patient: [name]  Procedure(s) Performed: CYSTOSCOPY WITH STENT PLACEMENT (Left Ureter) CYSTOSCOPY WITH RETROGRADE PYELOGRAM (Left Ureter)  Patient Location: PACU  Anesthesia Type:General  Level of Consciousness: awake, alert  and oriented  Airway & Oxygen Therapy: Patient Spontanous Breathing  Post-op Assessment: Report given to RN and Post -op Vital signs reviewed and stable  Post vital signs: Reviewed and stable  Last Vitals:  Vitals Value Taken Time  BP 155/71 08/04/2018 11:36 AM  Temp    Pulse 73 08/04/2018 11:36 AM  Resp 15 08/04/2018 11:36 AM  SpO2 100 % 08/04/2018 11:36 AM  Vitals shown include unvalidated device data.  Last Pain:  Vitals:   08/04/18 0937  TempSrc: Oral  PainSc: 4          Complications: No apparent anesthesia complications

## 2018-08-04 NOTE — Anesthesia Postprocedure Evaluation (Signed)
Anesthesia Post Note  Patient: [name]  Procedure(s) Performed: CYSTOSCOPY WITH STENT PLACEMENT (Left Ureter) CYSTOSCOPY WITH RETROGRADE PYELOGRAM (Left Ureter)  Patient location during evaluation: PACU Anesthesia Type: General Level of consciousness: awake and alert and oriented Pain management: pain level controlled Vital Signs Assessment: post-procedure vital signs reviewed and stable Respiratory status: spontaneous breathing, nonlabored ventilation and respiratory function stable Cardiovascular status: blood pressure returned to baseline and stable Postop Assessment: no signs of nausea or vomiting Anesthetic complications: no     Last Vitals:  Vitals:   08/04/18 1233 08/04/18 1241  BP: (!) 192/76 (!) 172/72  Pulse: 62 70  Resp: 18   Temp: 36.6 C   SpO2: 99% 100%    Last Pain:  Vitals:   08/04/18 1233  TempSrc: Temporal  PainSc: 7                  Elspeth Blucher

## 2018-08-04 NOTE — Anesthesia Procedure Notes (Signed)
Procedure Name: Intubation Date/Time: 08/04/2018 10:40 AM Performed by: Johnna Acosta, CRNA Pre-anesthesia Checklist: Patient identified, Emergency Drugs available, Suction available, Patient being monitored and Timeout performed Patient Re-evaluated:Patient Re-evaluated prior to induction Oxygen Delivery Method: Circle system utilized Preoxygenation: Pre-oxygenation with 100% oxygen Induction Type: IV induction Ventilation: Mask ventilation without difficulty Laryngoscope Size: Miller and 2 Grade View: Grade I Tube type: Oral Tube size: 7.5 mm Number of attempts: 1 Airway Equipment and Method: Stylet Placement Confirmation: ETT inserted through vocal cords under direct vision,  positive ETCO2 and breath sounds checked- equal and bilateral Secured at: 20 cm Tube secured with: Tape Dental Injury: Teeth and Oropharynx as per pre-operative assessment

## 2018-08-04 NOTE — Anesthesia Post-op Follow-up Note (Signed)
Anesthesia QCDR form completed.        

## 2018-08-04 NOTE — Discharge Instructions (Addendum)
Ureteral Stent Implantation, Care After Refer to this sheet in the next few weeks. These instructions provide you with information about caring for yourself after your procedure. Your health care provider may also give you more specific instructions. Your treatment has been planned according to current medical practices, but problems sometimes occur. Call your health care provider if you have any problems or questions after your procedure. What can I expect after the procedure? After the procedure, it is common to have:  Nausea.  Mild pain when you urinate. You may feel this pain in your lower back or lower abdomen. Pain should stop within a few minutes after you urinate. This may last for up to 1 week.  A small amount of blood in your urine for several days.  Follow these instructions at home:  Medicines  Take over-the-counter and prescription medicines only as told by your health care provider.  If you were prescribed an antibiotic medicine, take it as told by your health care provider. Do not stop taking the antibiotic even if you start to feel better.  Do not drive for 24 hours if you received a sedative.  Do not drive or operate heavy machinery while taking prescription pain medicines. Activity  Return to your normal activities as told by your health care provider. Ask your health care provider what activities are safe for you.  Do not lift anything that is heavier than 10 lb (4.5 kg). Follow this limit for 1 week after your procedure, or for as long as told by your health care provider. General instructions  Watch for any blood in your urine. Call your health care provider if the amount of blood in your urine increases.  If you have a catheter: ? Follow instructions from your health care provider about taking care of your catheter and collection bag. ? Do not take baths, swim, or use a hot tub until your health care provider approves.  Drink enough fluid to keep your urine  clear or pale yellow.  Keep all follow-up visits as told by your health care provider. This is important. Contact a health care provider if:  You have pain that gets worse or does not get better with medicine, especially pain when you urinate.  You have difficulty urinating.  You feel nauseous or you vomit repeatedly during a period of more than 2 days after the procedure. Get help right away if:  Your urine is dark red or has blood clots in it.  You are leaking urine (have incontinence).  The end of the stent comes out of your urethra.  You cannot urinate.  You have sudden, sharp, or severe pain in your abdomen or lower back.  You have a fever. This information is not intended to replace advice given to you by your health care provider. Make sure you discuss any questions you have with your health care provider. Document Released: 06/09/2013 Document Revised: 03/14/2016 Document Reviewed: 04/21/2015 Elsevier Interactive Patient Education  2018 [employer]. Cystoscopy, Care After Refer to this sheet in the next few weeks. These instructions provide you with information about caring for yourself after your procedure. Your health care provider may also give you more specific instructions. Your treatment has been planned according to current medical practices, but problems sometimes occur. Call your health care provider if you have any problems or questions after your procedure. What can I expect after the procedure? After the procedure, it is common to have:  Mild pain when you urinate. Pain should  stop within a few minutes after you urinate. This may last for up to 1 week.  A small amount of blood in your urine for several days.  Feeling like you need to urinate but producing only a small amount of urine.  Follow these instructions at home:  Medicines  Take over-the-counter and prescription medicines only as told by your health care provider.  If you were prescribed an  antibiotic medicine, take it as told by your health care provider. Do not stop taking the antibiotic even if you start to feel better. General instructions   Return to your normal activities as told by your health care provider. Ask your health care provider what activities are safe for you.  Do not drive for 24 hours if you received a sedative.  Watch for any blood in your urine. If the amount of blood in your urine increases, call your health care provider.  Follow instructions from your health care provider about eating or drinking restrictions.  If a tissue sample was removed for testing (biopsy) during your procedure, it is your responsibility to get your test results. Ask your health care provider or the department performing the test when your results will be ready.  Drink enough fluid to keep your urine clear or pale yellow.  Keep all follow-up visits as told by your health care provider. This is important. Contact a health care provider if:  You have pain that gets worse or does not get better with medicine, especially pain when you urinate.  You have difficulty urinating. Get help right away if:  You have more blood in your urine.  You have blood clots in your urine.  You have abdominal pain.  You have a fever or chills.  You are unable to urinate. This information is not intended to replace advice given to you by your health care provider. Make sure you discuss any questions you have with your health care provider. Document Released: 04/26/2005 Document Revised: 03/14/2016 Document Reviewed: 08/24/2015 Elsevier Interactive Patient Education  [employer].

## 2018-08-04 NOTE — Op Note (Signed)
Preoperative diagnosis:  1. Left renal pelvic calculus  Postoperative diagnosis:  1. Left renal pelvic calculus  Procedure:  1. Cystoscopy 2. Left ureteral stent placement (6 FR) 24 cm 3. Left retrograde pyelography with interpretation   Surgeon: Nicki Reaper C. Stoioff, M.D.  Anesthesia: General  Complications: None  Intraoperative findings:  Left retrograde pyelogram: Filling defect in the left renal pelvis consistent with patient's known renal pelvic calculus.  No hydronephrosis noted.  Very small caliber ureter.  EBL: Minimal  Specimens: None  Indication: [name] is a 72 y.o. patient with a 5 mm left renal pelvic calculus. After reviewing the management options for treatment, he elected to proceed with the above surgical procedure(s). We have discussed the potential benefits and risks of the procedure, side effects of the proposed treatment, the likelihood of the patient achieving the goals of the procedure, and any potential problems that might occur during the procedure or recuperation. Informed consent has been obtained.  Description of procedure:  The patient was taken to the operating room and general anesthesia was induced.  The patient was placed in the dorsal lithotomy position, prepped and draped in the usual sterile fashion, and preoperative antibiotics were administered. A preoperative time-out was performed.   Cystourethroscopy was performed.  The patient's urethra was examined and was normal/ demonstrated moderate lateral lobe enlargement with prostatic calculi. The bladder was then systematically examined in its entirety. There was no evidence for any bladder tumors, stones, or other mucosal pathology.  Mild trabeculation present.  The ureteral orifices were normal appearing bilaterally with clear efflux.  Attention then turned to the left ureteral orifice and a 0.038 Sensor wire was advanced up the ureter into the renal pelvis under fluoroscopic guidance.  A  dual-lumen catheter was then advanced over the wire however would not advance beyond a few centimeters of the distal ureter when buckling of the catheter was encountered.  6 and 8 French ureteral dilators passed without difficulty however a 10 [place] dilator would not pass.  The dual-lumen catheter was repassed and retrograde pyelogram was performed with findings as described above.   It was elected at this point to place a ureteral stent for passive dilation.  A 6 French/24 cm ureteral stent was advance over the wire under fluoroscopic guidance.  The stent was positioned appropriately both proximally and distally under fluoroscopy.  The bladder was then emptied and the procedure ended.  The patient appeared to tolerate the procedure well and without complications.  The patient was able to be awakened and transferred to the recovery unit in satisfactory condition.    Cameron Giovanni, MD

## 2018-08-04 NOTE — Interval H&P Note (Signed)
History and Physical Interval Note:  08/04/2018 10:08 AM  [name]  has presented today for surgery, with the diagnosis of left nephrolithiasis  The various methods of treatment have been discussed with the patient and family. After consideration of risks, benefits and other options for treatment, the patient has consented to  Procedure(s): CYSTOSCOPY/URETEROSCOPY/HOLMIUM LASER/STENT PLACEMENT (Left) as a surgical intervention .  The patient's history has been reviewed, patient examined, no change in status, stable for surgery.  I have reviewed the patient's chart and labs.  Questions were answered to the patient's satisfaction.     [place]

## 2018-08-05 ENCOUNTER — Encounter: Payer: Self-pay | Admitting: Urology

## 2018-08-05 ENCOUNTER — Telehealth: Payer: Self-pay | Admitting: Urology

## 2018-08-05 NOTE — Telephone Encounter (Signed)
Pt called office stating he had surgery yesterday, Dr. Bernardo Heater performed the surgery, pt states he wasn't given anything for pain and has been in "excrutiating" pain ever since, states he has been in "fetal" position. States most Dr. Julienne Kass something for pain but pt didn't receive anything, wants to know if he can get something for his pain, he will come to pick it up, call him when it's ready. Please advise pt at [phone].

## 2018-08-07 MED ORDER — HYDROCODONE-ACETAMINOPHEN 5-325 MG PO TABS: 1.0000 | ORAL_TABLET | Freq: Four times a day (QID) | ORAL | 0 refills | Status: DC | PRN

## 2018-08-07 NOTE — Telephone Encounter (Signed)
Rx pain medication was sent to pharmacy

## 2018-08-07 NOTE — Telephone Encounter (Signed)
Spoke to patient and he has been using Ibuprofen and the Oxybutynin but the pain is still there.

## 2018-08-10 NOTE — Telephone Encounter (Signed)
Patient notified

## 2018-08-12 ENCOUNTER — Ambulatory Visit (INDEPENDENT_AMBULATORY_CARE_PROVIDER_SITE_OTHER): Payer: Medicare HMO | Admitting: Urology

## 2018-08-12 ENCOUNTER — Encounter: Payer: Self-pay | Admitting: Urology

## 2018-08-12 ENCOUNTER — Other Ambulatory Visit: Payer: Self-pay | Admitting: Radiology

## 2018-08-12 ENCOUNTER — Other Ambulatory Visit: Payer: Self-pay

## 2018-08-12 VITALS — BP 96/65 | HR 82 | Ht 62.0 in | Wt 141.8 lb

## 2018-08-12 DIAGNOSIS — N200 Calculus of kidney: Secondary | ICD-10-CM

## 2018-08-12 NOTE — H&P (View-Only) (Signed)
08/12/2018 9:49 AM   [name] 1946-04-06 [id number]  Referring provider: Tracie Harrier, [place] [facility] [place], [place] [address]  Chief Complaint  Patient presents with  . Nephrolithiasis    HPI: 72 year old male presents for follow-up of nephrolithiasis.  He had a 5 mm left renal pelvic calculus and underwent attempted ureteroscopic stone removal however he had a small caliber ureter and upper tract access could not be obtained.  A ureteral stent was placed.  He was actually scheduled today for cystoscopy with stent removal.  He has mild stent symptoms but is overall tolerating well.  His SSD is less than 10 cm however stone density is ~1400 HU.  He has had successful shockwave lithotripsy in the past.   PMH: Past Medical History:  Diagnosis Date  . Anxiety   . Arthritis   . Bell's palsy    hx of, right side  . Depression    hx of depression  . GERD (gastroesophageal reflux disease)   . Hepatitis    hx of hepatitis C  . History of kidney stones   . Hypertension   . Kidney stone    hx of  . Stroke ([place]) 02/2018   loss of peripheral vision and balance unsteady  . Substance abuse [facility])    heroin    Surgical History: Past Surgical History:  Procedure Laterality Date  . ANTERIOR LUMBAR FUSION  05/13/2012   Procedure: ANTERIOR LUMBAR FUSION 1 LEVEL;  Surgeon: Melina Schools, MD;  Location: [place];  Service: Orthopedics;  Laterality: N/A;  ALIF L5-S1  . CHOLECYSTECTOMY    . CYSTOSCOPY W/ RETROGRADES Left 08/04/2018   Procedure: CYSTOSCOPY WITH RETROGRADE PYELOGRAM;  Surgeon: Abbie Sons, MD;  Location: [facility];  Service: Urology;  Laterality: Left;  . CYSTOSCOPY WITH STENT PLACEMENT Left 08/04/2018   Procedure: CYSTOSCOPY WITH STENT PLACEMENT;  Surgeon: Abbie Sons, MD;  Location: [facility];  Service: Urology;  Laterality: Left;  . EXTRACORPOREAL SHOCK WAVE LITHOTRIPSY  2012  . KIDNEY STONE SURGERY    . LUMBAR FUSION   05/13/2012    Home Medications:  Allergies as of 08/12/2018   No Known Allergies     Medication List        Accurate as of 08/12/18  9:49 AM. Always use your most recent med list.          aspirin EC 81 MG tablet Take 1 tablet (81 mg total) by mouth daily.   atorvastatin 40 MG tablet Commonly known as:  LIPITOR Take 1 tablet (40 mg total) by mouth daily at 6 PM.   cloNIDine 0.1 MG tablet Commonly known as:  CATAPRES Take 0.1 mg by mouth daily.   clopidogrel 75 MG tablet Commonly known as:  PLAVIX Take 1 tablet (75 mg total) by mouth daily.   DULoxetine 30 MG capsule Commonly known as:  CYMBALTA 30 mg per day x 7 days, increase to 60 mg. Take with food.   gabapentin 300 MG capsule Commonly known as:  NEURONTIN Take by mouth.   HYDROcodone-acetaminophen 5-325 MG tablet Commonly known as:  NORCO/VICODIN Take 1 tablet by mouth every 6 (six) hours as needed for moderate pain.   LORazepam 0.5 MG tablet Commonly known as:  ATIVAN Take 0.5 mg by mouth daily as needed for anxiety. Reported on 02/26/2016   oxybutynin 5 MG tablet Commonly known as:  DITROPAN 1 tab tid prn frequency,urgency, bladder spasm   tiZANidine 4 MG tablet Commonly known  as:  ZANAFLEX Take 4 mg by mouth 2 (two) times daily.   traMADol-acetaminophen 37.5-325 MG tablet Commonly known as:  ULTRACET Take 1 tablet by mouth every 8 (eight) hours as needed for moderate pain.   traZODone 150 MG tablet Commonly known as:  DESYREL Take 150 mg by mouth at bedtime.   vitamin B-12 500 MCG tablet Commonly known as:  CYANOCOBALAMIN Take 500 mcg by mouth daily.       Allergies: No Known Allergies  Family History: Family History  Problem Relation Age of Onset  . Stroke Mother   . Diabetes Mother   . Lung cancer Father   . Diabetes Father     Social History:  reports that he has been smoking cigarettes. He has a 26.50 pack-year smoking history. He has never used smokeless tobacco. He reports  that he drank alcohol. He reports that he does not use drugs.  ROS: No change from 07/08/2018  Physical Exam: BP 96/65 (BP Location: Left Arm, Patient Position: Sitting, Cuff Size: Normal)   Pulse 82   Ht 5\' 2"  (1.575 m)   Wt 141 lb 12.8 oz (64.3 kg)   BMI 25.94 kg/m   Constitutional:  Alert and oriented, No acute distress. HEENT: [place] AT, moist mucus membranes.  Trachea midline, no masses. Cardiovascular: No clubbing, cyanosis, or edema.  RRR Respiratory: Normal respiratory effort, no increased work of breathing.  Lungs clear GI: Abdomen is soft, nontender, nondistended, no abdominal masses GU: No CVA tenderness Lymph: No cervical or inguinal lymphadenopathy. Skin: No rashes, bruises or suspicious lesions. Neurologic: Grossly intact, no focal deficits, moving all 4 extremities. Psychiatric: Normal mood and affect.    Assessment & Plan:   72 year old male with a 5 mm left renal pelvic calculus status post recent attempt at ureteroscopic removal.  Management options were discussed of follow-up ureteroscopy after a period of passive stent dilation.  The alternative of shockwave lithotripsy was also discussed.  He has a hard stone with density approximately 1400 HU which would make ESWL potentially less successful.  He would like to try shockwave lithotripsy and does understand that the stone may not fragment adequately.  He will need to be off Plavix prior to treatment.  The indications and nature of the planned procedure were discussed as well as the potential benefits and expected outcome.  Alternatives were discussed as described above.  The most common complications and side effects were discussed as outlined in the [facility] consent form.  It was stressed that there is no guarantee that lithotripsy will be successful and he could require retreatment or alternative treatment.  The rare instance of perirenal bleeding requiring hospitalization, transfusion and rarely surgery  were discussed.  He indicated all questions were answered to his satisfaction and desires to proceed.   Abbie Sons, [place] [address], [place], [place] [address] [phone]

## 2018-08-12 NOTE — Progress Notes (Signed)
08/12/2018 9:49 AM   [name] [birth year] [id number]  Referring provider: Tracie Harrier, [place] [facility] [place], [place] [address]  Chief Complaint  Patient presents with  . Nephrolithiasis    HPI: 72 year old male presents for follow-up of nephrolithiasis.  He had a 5 mm left renal pelvic calculus and underwent attempted ureteroscopic stone removal however he had a small caliber ureter and upper tract access could not be obtained.  A ureteral stent was placed.  He was actually scheduled today for cystoscopy with stent removal.  He has mild stent symptoms but is overall tolerating well.  His SSD is less than 10 cm however stone density is ~1400 HU.  He has had successful shockwave lithotripsy in the past.   PMH: Past Medical History:  Diagnosis Date  . Anxiety   . Arthritis   . Bell's palsy    hx of, right side  . Depression    hx of depression  . GERD (gastroesophageal reflux disease)   . Hepatitis    hx of hepatitis C  . History of kidney stones   . Hypertension   . Kidney stone    hx of  . Stroke ([place]) 02/2018   loss of peripheral vision and balance unsteady  . Substance abuse [facility])    heroin    Surgical History: Past Surgical History:  Procedure Laterality Date  . ANTERIOR LUMBAR FUSION  05/13/2012   Procedure: ANTERIOR LUMBAR FUSION 1 LEVEL;  Surgeon: Melina Schools, MD;  Location: [place];  Service: Orthopedics;  Laterality: N/A;  ALIF L5-S1  . CHOLECYSTECTOMY    . CYSTOSCOPY W/ RETROGRADES Left 08/04/2018   Procedure: CYSTOSCOPY WITH RETROGRADE PYELOGRAM;  Surgeon: Abbie Sons, MD;  Location: [facility];  Service: Urology;  Laterality: Left;  . CYSTOSCOPY WITH STENT PLACEMENT Left 08/04/2018   Procedure: CYSTOSCOPY WITH STENT PLACEMENT;  Surgeon: Abbie Sons, MD;  Location: [facility];  Service: Urology;  Laterality: Left;  . EXTRACORPOREAL SHOCK WAVE LITHOTRIPSY  2012  . KIDNEY STONE SURGERY    . LUMBAR FUSION   05/13/2012    Home Medications:  Allergies as of 08/12/2018   No Known Allergies     Medication List        Accurate as of 08/12/18  9:49 AM. Always use your most recent med list.          aspirin EC 81 MG tablet Take 1 tablet (81 mg total) by mouth daily.   atorvastatin 40 MG tablet Commonly known as:  LIPITOR Take 1 tablet (40 mg total) by mouth daily at 6 PM.   cloNIDine 0.1 MG tablet Commonly known as:  CATAPRES Take 0.1 mg by mouth daily.   clopidogrel 75 MG tablet Commonly known as:  PLAVIX Take 1 tablet (75 mg total) by mouth daily.   DULoxetine 30 MG capsule Commonly known as:  CYMBALTA 30 mg per day x 7 days, increase to 60 mg. Take with food.   gabapentin 300 MG capsule Commonly known as:  NEURONTIN Take by mouth.   HYDROcodone-acetaminophen 5-325 MG tablet Commonly known as:  NORCO/VICODIN Take 1 tablet by mouth every 6 (six) hours as needed for moderate pain.   LORazepam 0.5 MG tablet Commonly known as:  ATIVAN Take 0.5 mg by mouth daily as needed for anxiety. Reported on 02/26/2016   oxybutynin 5 MG tablet Commonly known as:  DITROPAN 1 tab tid prn frequency,urgency, bladder spasm   tiZANidine 4 MG tablet Commonly known  as:  ZANAFLEX Take 4 mg by mouth 2 (two) times daily.   traMADol-acetaminophen 37.5-325 MG tablet Commonly known as:  ULTRACET Take 1 tablet by mouth every 8 (eight) hours as needed for moderate pain.   traZODone 150 MG tablet Commonly known as:  DESYREL Take 150 mg by mouth at bedtime.   vitamin B-12 500 MCG tablet Commonly known as:  CYANOCOBALAMIN Take 500 mcg by mouth daily.       Allergies: No Known Allergies  Family History: Family History  Problem Relation Age of Onset  . Stroke Mother   . Diabetes Mother   . Lung cancer Father   . Diabetes Father     Social History:  reports that he has been smoking cigarettes. He has a 26.50 pack-year smoking history. He has never used smokeless tobacco. He reports  that he drank alcohol. He reports that he does not use drugs.  ROS: No change from 07/08/2018  Physical Exam: BP 96/65 (BP Location: Left Arm, Patient Position: Sitting, Cuff Size: Normal)   Pulse 82   Ht 5\' 2"  (1.575 m)   Wt 141 lb 12.8 oz (64.3 kg)   BMI 25.94 kg/m   Constitutional:  Alert and oriented, No acute distress. HEENT: [place] AT, moist mucus membranes.  Trachea midline, no masses. Cardiovascular: No clubbing, cyanosis, or edema.  RRR Respiratory: Normal respiratory effort, no increased work of breathing.  Lungs clear GI: Abdomen is soft, nontender, nondistended, no abdominal masses GU: No CVA tenderness Lymph: No cervical or inguinal lymphadenopathy. Skin: No rashes, bruises or suspicious lesions. Neurologic: Grossly intact, no focal deficits, moving all 4 extremities. Psychiatric: Normal mood and affect.    Assessment & Plan:   72 year old male with a 5 mm left renal pelvic calculus status post recent attempt at ureteroscopic removal.  Management options were discussed of follow-up ureteroscopy after a period of passive stent dilation.  The alternative of shockwave lithotripsy was also discussed.  He has a hard stone with density approximately 1400 HU which would make ESWL potentially less successful.  He would like to try shockwave lithotripsy and does understand that the stone may not fragment adequately.  He will need to be off Plavix prior to treatment.  The indications and nature of the planned procedure were discussed as well as the potential benefits and expected outcome.  Alternatives were discussed as described above.  The most common complications and side effects were discussed as outlined in the [facility] consent form.  It was stressed that there is no guarantee that lithotripsy will be successful and he could require retreatment or alternative treatment.  The rare instance of perirenal bleeding requiring hospitalization, transfusion and rarely surgery  were discussed.  He indicated all questions were answered to his satisfaction and desires to proceed.   Abbie Sons, [place] [address] [place], [place] [address] [phone]

## 2018-08-16 LAB — CULTURE, URINE COMPREHENSIVE

## 2018-08-17 ENCOUNTER — Encounter: Payer: Self-pay | Admitting: *Deleted

## 2018-08-19 MED ORDER — CIPROFLOXACIN HCL 500 MG PO TABS: 500.0000 mg | ORAL_TABLET | ORAL | Status: DC

## 2018-08-20 ENCOUNTER — Encounter: Payer: Self-pay | Admitting: Urology

## 2018-08-20 ENCOUNTER — Other Ambulatory Visit: Payer: Self-pay | Admitting: Radiology

## 2018-08-20 ENCOUNTER — Ambulatory Visit
Admission: RE | Admit: 2018-08-20 | Discharge: 2018-08-20 | Disposition: A | Discharge status: Home / Self Care | Payer: Medicare HMO | Source: Ambulatory Visit | Attending: Urology | Admitting: Urology

## 2018-08-20 ENCOUNTER — Ambulatory Visit: Payer: Medicare HMO

## 2018-08-20 ENCOUNTER — Encounter
Admission: RE | Disposition: A | Discharge status: Home / Self Care | Payer: Self-pay | Source: Ambulatory Visit | Attending: Urology

## 2018-08-20 ENCOUNTER — Telehealth: Payer: Self-pay | Admitting: Radiology

## 2018-08-20 DIAGNOSIS — Z8619 Personal history of other infectious and parasitic diseases: Secondary | ICD-10-CM | POA: Diagnosis not present

## 2018-08-20 DIAGNOSIS — F17210 Nicotine dependence, cigarettes, uncomplicated: Secondary | ICD-10-CM | POA: Insufficient documentation

## 2018-08-20 DIAGNOSIS — Z87442 Personal history of urinary calculi: Secondary | ICD-10-CM | POA: Diagnosis not present

## 2018-08-20 DIAGNOSIS — Z823 Family history of stroke: Secondary | ICD-10-CM | POA: Diagnosis not present

## 2018-08-20 DIAGNOSIS — N200 Calculus of kidney: Secondary | ICD-10-CM | POA: Insufficient documentation

## 2018-08-20 DIAGNOSIS — Z7982 Long term (current) use of aspirin: Secondary | ICD-10-CM | POA: Diagnosis not present

## 2018-08-20 DIAGNOSIS — F419 Anxiety disorder, unspecified: Secondary | ICD-10-CM | POA: Diagnosis not present

## 2018-08-20 DIAGNOSIS — M1990 Unspecified osteoarthritis, unspecified site: Secondary | ICD-10-CM | POA: Insufficient documentation

## 2018-08-20 DIAGNOSIS — N420 Calculus of prostate: Secondary | ICD-10-CM | POA: Insufficient documentation

## 2018-08-20 DIAGNOSIS — Z8673 Personal history of transient ischemic attack (TIA), and cerebral infarction without residual deficits: Secondary | ICD-10-CM | POA: Insufficient documentation

## 2018-08-20 DIAGNOSIS — Z981 Arthrodesis status: Secondary | ICD-10-CM | POA: Insufficient documentation

## 2018-08-20 DIAGNOSIS — F329 Major depressive disorder, single episode, unspecified: Secondary | ICD-10-CM | POA: Diagnosis not present

## 2018-08-20 DIAGNOSIS — Z7902 Long term (current) use of antithrombotics/antiplatelets: Secondary | ICD-10-CM | POA: Diagnosis not present

## 2018-08-20 DIAGNOSIS — Z79891 Long term (current) use of opiate analgesic: Secondary | ICD-10-CM | POA: Insufficient documentation

## 2018-08-20 DIAGNOSIS — Z79899 Other long term (current) drug therapy: Secondary | ICD-10-CM | POA: Insufficient documentation

## 2018-08-20 DIAGNOSIS — I10 Essential (primary) hypertension: Secondary | ICD-10-CM | POA: Insufficient documentation

## 2018-08-20 HISTORY — PX: EXTRACORPOREAL SHOCK WAVE LITHOTRIPSY: SHX1557

## 2018-08-20 HISTORY — DX: Dorsalgia, unspecified: M54.9

## 2018-08-20 HISTORY — DX: Other chronic pain: G89.29

## 2018-08-20 LAB — URINE DRUG SCREEN, QUALITATIVE (ARMC ONLY)
AMPHETAMINES, UR SCREEN: NOT DETECTED
BARBITURATES, UR SCREEN: NOT DETECTED
BENZODIAZEPINE, UR SCRN: POSITIVE — AB
CANNABINOID 50 NG, UR ~~LOC~~: NOT DETECTED
Cocaine Metabolite,Ur ~~LOC~~: NOT DETECTED
MDMA (Ecstasy)Ur Screen: NOT DETECTED
Methadone Scn, Ur: NOT DETECTED
Opiate, Ur Screen: NOT DETECTED
PHENCYCLIDINE (PCP) UR S: NOT DETECTED
Tricyclic, Ur Screen: POSITIVE — AB

## 2018-08-20 SURGERY — LITHOTRIPSY, ESWL
Anesthesia: Moderate Sedation | Laterality: Left

## 2018-08-20 MED ORDER — ONDANSETRON HCL 2 MG/ML IV SOLN: 4.0000 mg | Freq: Once | INTRAVENOUS | Status: DC

## 2018-08-20 MED ORDER — DIPHENHYDRAMINE HCL 25 MG PO CAPS
ORAL_CAPSULE | ORAL | Status: AC
  Filled 2018-08-20: qty 1

## 2018-08-20 MED ORDER — SODIUM CHLORIDE 0.9 % IV SOLN: INTRAVENOUS | Status: DC

## 2018-08-20 MED ORDER — DIPHENHYDRAMINE HCL 25 MG PO CAPS: 25.0000 mg | ORAL_CAPSULE | ORAL | Status: DC

## 2018-08-20 MED ORDER — DIAZEPAM 5 MG PO TABS: 10.0000 mg | ORAL_TABLET | ORAL | Status: DC

## 2018-08-20 MED ORDER — CIPROFLOXACIN HCL 500 MG PO TABS
ORAL_TABLET | ORAL | Status: AC
  Filled 2018-08-20: qty 1

## 2018-08-20 MED ORDER — DIAZEPAM 5 MG PO TABS
ORAL_TABLET | ORAL | Status: AC
  Filled 2018-08-20: qty 2

## 2018-08-20 MED ORDER — ONDANSETRON HCL 4 MG/2ML IJ SOLN: 4.0000 mg | Freq: Once | INTRAMUSCULAR | Status: DC

## 2018-08-20 MED ORDER — ONDANSETRON HCL 4 MG/2ML IJ SOLN
INTRAMUSCULAR | Status: AC
  Filled 2018-08-20: qty 2

## 2018-08-20 NOTE — OR Nursing (Addendum)
Pt states " that he was not told to stop Plavix 96 hours prior to lithotripsy". He reports that he "did stop all his medications on Monday so he would not get confused about what to take".

## 2018-08-20 NOTE — Telephone Encounter (Signed)
    Ranell Patrick, RN

## 2018-08-20 NOTE — Telephone Encounter (Signed)
Notified patient of lithotripsy scheduled 08/27/2018 at 6:15 am. Patient should arrive at that time to Same Day Surgery. Pre-op instructions for lithotripsy were provided to patient.    Advised patient to hold Aspirin 81mg  and Plavix for 5 days prior to the procedure beginning on 08/22/2018.  Patient's questions were answered & he expresses understanding of these instructions.    Ranell Patrick, RN

## 2018-08-27 ENCOUNTER — Encounter: Payer: Self-pay | Admitting: *Deleted

## 2018-08-27 ENCOUNTER — Ambulatory Visit
Admission: RE | Admit: 2018-08-27 | Discharge: 2018-08-27 | Disposition: A | Discharge status: Home / Self Care | Payer: Medicare HMO | Source: Ambulatory Visit | Attending: Urology | Admitting: Urology

## 2018-08-27 ENCOUNTER — Other Ambulatory Visit: Payer: Self-pay

## 2018-08-27 ENCOUNTER — Ambulatory Visit: Payer: Medicare HMO

## 2018-08-27 ENCOUNTER — Telehealth: Payer: Self-pay | Admitting: Radiology

## 2018-08-27 ENCOUNTER — Encounter
Admission: RE | Disposition: A | Discharge status: Home / Self Care | Payer: Self-pay | Source: Ambulatory Visit | Attending: Urology

## 2018-08-27 DIAGNOSIS — Z8673 Personal history of transient ischemic attack (TIA), and cerebral infarction without residual deficits: Secondary | ICD-10-CM | POA: Diagnosis not present

## 2018-08-27 DIAGNOSIS — F419 Anxiety disorder, unspecified: Secondary | ICD-10-CM | POA: Insufficient documentation

## 2018-08-27 DIAGNOSIS — Z79899 Other long term (current) drug therapy: Secondary | ICD-10-CM | POA: Diagnosis not present

## 2018-08-27 DIAGNOSIS — F17210 Nicotine dependence, cigarettes, uncomplicated: Secondary | ICD-10-CM | POA: Insufficient documentation

## 2018-08-27 DIAGNOSIS — Z7902 Long term (current) use of antithrombotics/antiplatelets: Secondary | ICD-10-CM | POA: Insufficient documentation

## 2018-08-27 DIAGNOSIS — Z7982 Long term (current) use of aspirin: Secondary | ICD-10-CM | POA: Diagnosis not present

## 2018-08-27 DIAGNOSIS — I10 Essential (primary) hypertension: Secondary | ICD-10-CM | POA: Insufficient documentation

## 2018-08-27 DIAGNOSIS — F329 Major depressive disorder, single episode, unspecified: Secondary | ICD-10-CM | POA: Insufficient documentation

## 2018-08-27 DIAGNOSIS — Z87442 Personal history of urinary calculi: Secondary | ICD-10-CM | POA: Insufficient documentation

## 2018-08-27 DIAGNOSIS — N200 Calculus of kidney: Secondary | ICD-10-CM | POA: Insufficient documentation

## 2018-08-27 HISTORY — PX: EXTRACORPOREAL SHOCK WAVE LITHOTRIPSY: SHX1557

## 2018-08-27 LAB — URINE DRUG SCREEN, QUALITATIVE (ARMC ONLY)
AMPHETAMINES, UR SCREEN: NOT DETECTED
Barbiturates, Ur Screen: NOT DETECTED
Benzodiazepine, Ur Scrn: NOT DETECTED
CANNABINOID 50 NG, UR ~~LOC~~: NOT DETECTED
Cocaine Metabolite,Ur ~~LOC~~: NOT DETECTED
MDMA (ECSTASY) UR SCREEN: NOT DETECTED
Methadone Scn, Ur: NOT DETECTED
OPIATE, UR SCREEN: NOT DETECTED
PHENCYCLIDINE (PCP) UR S: NOT DETECTED
Tricyclic, Ur Screen: POSITIVE — AB

## 2018-08-27 SURGERY — LITHOTRIPSY, ESWL
Anesthesia: Moderate Sedation | Laterality: Left

## 2018-08-27 MED ORDER — DIPHENHYDRAMINE HCL 25 MG PO CAPS
25.0000 mg | ORAL_CAPSULE | ORAL | Status: AC
  Administered 2018-08-27: 25 mg via ORAL

## 2018-08-27 MED ORDER — DIPHENHYDRAMINE HCL 25 MG PO CAPS
ORAL_CAPSULE | ORAL | Status: AC
  Filled 2018-08-27: qty 1

## 2018-08-27 MED ORDER — HYDROCODONE-ACETAMINOPHEN 5-325 MG PO TABS: 1.0000 | ORAL_TABLET | Freq: Four times a day (QID) | ORAL | 0 refills | Status: DC | PRN

## 2018-08-27 MED ORDER — ONDANSETRON HCL 4 MG/2ML IJ SOLN
4.0000 mg | Freq: Once | INTRAMUSCULAR | Status: AC
  Administered 2018-08-27: 4 mg via INTRAVENOUS

## 2018-08-27 MED ORDER — SODIUM CHLORIDE 0.9 % IV SOLN
INTRAVENOUS | Status: DC
  Administered 2018-08-27: 07:00:00 via INTRAVENOUS

## 2018-08-27 MED ORDER — CIPROFLOXACIN HCL 500 MG PO TABS
ORAL_TABLET | ORAL | Status: AC
  Filled 2018-08-27: qty 1

## 2018-08-27 MED ORDER — DIAZEPAM 5 MG PO TABS
ORAL_TABLET | ORAL | Status: AC
  Filled 2018-08-27: qty 2

## 2018-08-27 MED ORDER — CIPROFLOXACIN HCL 500 MG PO TABS
500.0000 mg | ORAL_TABLET | ORAL | Status: AC
  Administered 2018-08-27: 500 mg via ORAL

## 2018-08-27 MED ORDER — ONDANSETRON HCL 2 MG/ML IV SOLN: 4.0000 mg | Freq: Once | INTRAVENOUS | Status: DC

## 2018-08-27 MED ORDER — DIAZEPAM 5 MG PO TABS
10.0000 mg | ORAL_TABLET | ORAL | Status: AC
  Administered 2018-08-27: 10 mg via ORAL

## 2018-08-27 MED ORDER — ONDANSETRON HCL 4 MG/2ML IJ SOLN
INTRAMUSCULAR | Status: AC
  Administered 2018-08-27: 4 mg
  Filled 2018-08-27: qty 2

## 2018-08-27 NOTE — Telephone Encounter (Signed)
Notified patient of script sent to pharmacy. Patient expresses understanding & appreciation.

## 2018-08-27 NOTE — Interval H&P Note (Signed)
History and Physical Interval Note:  08/27/2018 7:18 AM  [name]  has presented today for surgery, with the diagnosis of Kidney stone  The various methods of treatment have been discussed with the patient and family. After consideration of risks, benefits and other options for treatment, the patient has consented to  Procedure(s): EXTRACORPOREAL SHOCK WAVE LITHOTRIPSY (ESWL) (Left) as a surgical intervention .  The patient's history has been reviewed, patient examined, no change in status, stable for surgery.  I have reviewed the patient's chart and labs.  Questions were answered to the patient's satisfaction.     [place]

## 2018-08-27 NOTE — Telephone Encounter (Signed)
Patient asks when to resume aspirin & plavix. Advised patient to resume on 08/28/2018. Patient requests refill of vicodin for moderate post op pain. Advised patient to alternate ibuprofen & acetaminophen & will return call regarding vicodin. Questions answered. Patient expresses understanding of conversation.   Dr Bernardo Heater -  Patient requests refill of vicodin for moderate post op pain.

## 2018-08-27 NOTE — Telephone Encounter (Signed)
He should not have significant postop pain since he has an indwelling stent.  Rx 10 tabs was sent.

## 2018-09-02 ENCOUNTER — Ambulatory Visit
Admission: RE | Admit: 2018-09-02 | Discharge: 2018-09-02 | Disposition: A | Discharge status: Home / Self Care | Payer: Medicare HMO | Source: Ambulatory Visit | Attending: Urology | Admitting: Urology

## 2018-09-02 ENCOUNTER — Ambulatory Visit (INDEPENDENT_AMBULATORY_CARE_PROVIDER_SITE_OTHER): Payer: Medicare HMO | Admitting: Urology

## 2018-09-02 ENCOUNTER — Encounter: Payer: Self-pay | Admitting: Urology

## 2018-09-02 VITALS — BP 133/83 | HR 96 | Ht 62.0 in | Wt 139.8 lb

## 2018-09-02 DIAGNOSIS — N200 Calculus of kidney: Secondary | ICD-10-CM | POA: Insufficient documentation

## 2018-09-02 DIAGNOSIS — R3129 Other microscopic hematuria: Secondary | ICD-10-CM

## 2018-09-02 LAB — URINALYSIS, COMPLETE
Bilirubin, UA: NEGATIVE
Glucose, UA: NEGATIVE
KETONES UA: NEGATIVE
NITRITE UA: NEGATIVE
PH UA: 6 (ref 5.0–7.5)
Specific Gravity, UA: >1.03 — ABNORMAL HIGH (ref 1.005–1.030)
Urobilinogen, Ur: 1 mg/dL (ref 0.2–1.0)

## 2018-09-02 LAB — MICROSCOPIC EXAMINATION: EPITHELIAL CELLS (NON RENAL): NONE SEEN /HPF (ref 0–10)

## 2018-09-02 MED ORDER — LIDOCAINE HCL URETHRAL/MUCOSAL 2 % EX GEL
1.0000 "application " | Freq: Once | CUTANEOUS | Status: AC
  Administered 2018-09-02: 1 via URETHRAL

## 2018-09-02 NOTE — Progress Notes (Signed)
Indications: Patient is 72 y.o., male who underwent shockwave lithotripsy of a left renal calculus on 08/27/2018.  He had no postoperative problems.  KUB reviewed today does show stone fragmentation with lower pole fragments the largest measuring approximately 4 mm.  The patient is presenting today for stent removal.  I discussed the possibility of renal colic from passing these fragments if his stent is removed versus a repeat shockwave lithotripsy.  He has elected stent removal.  Procedure:  Flexible Cystoscopy with stent removal (30051)  Timeout was performed and the correct patient, procedure and participants were identified.    Description:  The patient was prepped and draped in the usual sterile fashion. Flexible cystosopy was performed.  The stent was visualized, grasped, and removed intact without difficulty. The patient tolerated the procedure well.  A single dose of oral antibiotics was given.  Complications:  None  Plan: We will obtain a follow-up KUB in 2 to 3 weeks.  He was instructed to call earlier for development of renal colic.  He would like to schedule shockwave lithotripsy of his right renal calculus in the near future.

## 2018-09-02 NOTE — Addendum Note (Signed)
Addended by: Donalee Citrin on: 09/02/2018 03:52 PM   Modules accepted: Orders

## 2018-09-23 ENCOUNTER — Telehealth: Payer: Self-pay | Admitting: Radiology

## 2018-09-23 ENCOUNTER — Ambulatory Visit
Admission: RE | Admit: 2018-09-23 | Discharge: 2018-09-23 | Disposition: A | Discharge status: Home / Self Care | Payer: Medicare HMO | Source: Ambulatory Visit | Attending: Urology | Admitting: Urology

## 2018-09-23 DIAGNOSIS — N200 Calculus of kidney: Secondary | ICD-10-CM | POA: Diagnosis not present

## 2018-09-23 NOTE — Telephone Encounter (Signed)
Patient reports that he had follow up KUB done today & is having pain. Patient may be reached at [phone].

## 2018-09-23 NOTE — Telephone Encounter (Signed)
KUB was reviewed and does show some residual left renal calculi which do not appear to be in a position of obstruction.  No ureteral fragments are identified.  His right renal calculus may have migrated to the renal pelvis.  Where is he having pain?

## 2018-09-23 NOTE — Telephone Encounter (Signed)
Patient reports right groin pain & he is still passing fragments since ESWL on 08/27/2018 (fragments are being collected). Denies hematuria, fever or other urinary symptoms, only pain.

## 2018-09-24 NOTE — Telephone Encounter (Signed)
His right pain may be secondary to migration of the calculus to the renal pelvis.  For the stone can be treated with lithotripsy need to make sure he does not have left hydronephrosis.  Renal ultrasound order was entered.

## 2018-09-24 NOTE — Telephone Encounter (Signed)
[facility] notifying patient of need for renal ultrasound & that radiology scheduling office will call patient to schedule imaging.

## 2018-10-01 ENCOUNTER — Ambulatory Visit
Admission: RE | Admit: 2018-10-01 | Discharge: 2018-10-01 | Disposition: A | Discharge status: Home / Self Care | Payer: Medicare HMO | Source: Ambulatory Visit | Attending: Urology | Admitting: Urology

## 2018-10-01 DIAGNOSIS — N400 Enlarged prostate without lower urinary tract symptoms: Secondary | ICD-10-CM | POA: Diagnosis not present

## 2018-10-01 DIAGNOSIS — N200 Calculus of kidney: Secondary | ICD-10-CM | POA: Diagnosis not present

## 2018-10-05 ENCOUNTER — Other Ambulatory Visit: Payer: Medicare HMO

## 2018-10-05 ENCOUNTER — Other Ambulatory Visit: Payer: Self-pay | Admitting: Radiology

## 2018-10-05 ENCOUNTER — Telehealth: Payer: Self-pay | Admitting: Radiology

## 2018-10-05 DIAGNOSIS — N200 Calculus of kidney: Secondary | ICD-10-CM

## 2018-10-05 NOTE — Telephone Encounter (Signed)
ESWL paperwork filled out with patient. Instructions given. Questions answered. Patient expresses understanding of conversation.

## 2018-10-05 NOTE — Telephone Encounter (Signed)
Notified patient of Dr Dene Gentry message below. Patient will return to clinic to fill out ESWL paperwork.

## 2018-10-05 NOTE — Telephone Encounter (Signed)
-----   Message from Abbie Sons, MD sent at 10/04/2018 11:39 AM EST ----- Renal ultrasound showed no left hydronephrosis.  Nonobstructing right renal calculus.  Can schedule ESWL of his right renal calculus

## 2018-10-06 ENCOUNTER — Telehealth: Payer: Self-pay | Admitting: Radiology

## 2018-10-06 ENCOUNTER — Other Ambulatory Visit: Payer: Medicare HMO

## 2018-10-06 ENCOUNTER — Other Ambulatory Visit: Payer: Self-pay | Admitting: Radiology

## 2018-10-06 DIAGNOSIS — N200 Calculus of kidney: Secondary | ICD-10-CM

## 2018-10-06 LAB — URINALYSIS, COMPLETE
Bilirubin, UA: NEGATIVE
GLUCOSE, UA: NEGATIVE
Ketones, UA: NEGATIVE
Leukocytes, UA: NEGATIVE
Nitrite, UA: NEGATIVE
PH UA: 6 (ref 5.0–7.5)
PROTEIN UA: NEGATIVE
Specific Gravity, UA: 1.025 (ref 1.005–1.030)
UUROB: 0.2 mg/dL (ref 0.2–1.0)

## 2018-10-06 LAB — MICROSCOPIC EXAMINATION
Epithelial Cells (non renal): NONE SEEN /hpf (ref 0–10)
WBC UA: NONE SEEN /HPF (ref 0–5)

## 2018-10-06 MED ORDER — TRAMADOL-ACETAMINOPHEN 37.5-325 MG PO TABS: 1.0000 | ORAL_TABLET | Freq: Three times a day (TID) | ORAL | 0 refills | Status: DC | PRN

## 2018-10-06 NOTE — Telephone Encounter (Signed)
Patient requests pain medication to last until ESWL on 10/15/2018.

## 2018-10-06 NOTE — Telephone Encounter (Signed)
Based on narcotic prescribing guidelines I can only give a 5-day supply.  Rx was sent

## 2018-10-06 NOTE — Telephone Encounter (Signed)
Notified patient of script sent to pharmacy. 

## 2018-10-08 ENCOUNTER — Other Ambulatory Visit: Payer: Self-pay | Admitting: Radiology

## 2018-10-08 NOTE — Telephone Encounter (Signed)
Pharmacy could not fill script for Ultracet because patient had a 30 day supply prescribed on 09/22/2018. Advised patient that he could fill the new script after 10/23/2018. Patient requests Vicodin to be called to pharmacy. Please advise.

## 2018-10-08 NOTE — Telephone Encounter (Signed)
His stone is not obstructing- would not recc vicodin

## 2018-10-09 NOTE — Telephone Encounter (Signed)
Advised patient that since his stone is not obstructing, Dr Bernardo Heater doesn't recommend Vicodin. Patient expresses understanding.

## 2018-10-10 LAB — CULTURE, URINE COMPREHENSIVE

## 2018-10-14 MED ORDER — CIPROFLOXACIN HCL 500 MG PO TABS
500.0000 mg | ORAL_TABLET | ORAL | Status: AC
  Administered 2018-10-15: 500 mg via ORAL

## 2018-10-15 ENCOUNTER — Other Ambulatory Visit: Payer: Self-pay

## 2018-10-15 ENCOUNTER — Ambulatory Visit
Admission: RE | Admit: 2018-10-15 | Discharge: 2018-10-15 | Disposition: A | Discharge status: Home / Self Care | Payer: Medicare HMO | Attending: Urology | Admitting: Urology

## 2018-10-15 ENCOUNTER — Encounter
Admission: RE | Disposition: A | Discharge status: Home / Self Care | Payer: Self-pay | Source: Home / Self Care | Attending: Urology

## 2018-10-15 ENCOUNTER — Ambulatory Visit: Payer: Medicare HMO

## 2018-10-15 DIAGNOSIS — Z8673 Personal history of transient ischemic attack (TIA), and cerebral infarction without residual deficits: Secondary | ICD-10-CM | POA: Diagnosis not present

## 2018-10-15 DIAGNOSIS — F17210 Nicotine dependence, cigarettes, uncomplicated: Secondary | ICD-10-CM | POA: Insufficient documentation

## 2018-10-15 DIAGNOSIS — M1990 Unspecified osteoarthritis, unspecified site: Secondary | ICD-10-CM | POA: Diagnosis not present

## 2018-10-15 DIAGNOSIS — G8929 Other chronic pain: Secondary | ICD-10-CM | POA: Insufficient documentation

## 2018-10-15 DIAGNOSIS — N200 Calculus of kidney: Secondary | ICD-10-CM | POA: Diagnosis not present

## 2018-10-15 DIAGNOSIS — Z87442 Personal history of urinary calculi: Secondary | ICD-10-CM | POA: Insufficient documentation

## 2018-10-15 DIAGNOSIS — Z7982 Long term (current) use of aspirin: Secondary | ICD-10-CM | POA: Insufficient documentation

## 2018-10-15 DIAGNOSIS — I10 Essential (primary) hypertension: Secondary | ICD-10-CM | POA: Insufficient documentation

## 2018-10-15 DIAGNOSIS — Z7902 Long term (current) use of antithrombotics/antiplatelets: Secondary | ICD-10-CM | POA: Diagnosis not present

## 2018-10-15 HISTORY — PX: EXTRACORPOREAL SHOCK WAVE LITHOTRIPSY: SHX1557

## 2018-10-15 SURGERY — LITHOTRIPSY, ESWL
Anesthesia: Moderate Sedation | Laterality: Right

## 2018-10-15 MED ORDER — CIPROFLOXACIN HCL 500 MG PO TABS
ORAL_TABLET | ORAL | Status: AC
  Administered 2018-10-15: 500 mg via ORAL
  Filled 2018-10-15: qty 1

## 2018-10-15 MED ORDER — OXYCODONE-ACETAMINOPHEN 5-325 MG PO TABS
1.0000 | ORAL_TABLET | ORAL | Status: DC | PRN
  Administered 2018-10-15: 1 via ORAL

## 2018-10-15 MED ORDER — DIPHENHYDRAMINE HCL 25 MG PO CAPS
ORAL_CAPSULE | ORAL | Status: AC
  Administered 2018-10-15: 25 mg via ORAL
  Filled 2018-10-15: qty 1

## 2018-10-15 MED ORDER — OXYCODONE-ACETAMINOPHEN 5-325 MG PO TABS: 1.0000 | ORAL_TABLET | ORAL | 0 refills | Status: AC | PRN

## 2018-10-15 MED ORDER — TAMSULOSIN HCL 0.4 MG PO CAPS: 0.4000 mg | ORAL_CAPSULE | Freq: Every day | ORAL | 0 refills | Status: DC

## 2018-10-15 MED ORDER — ONDANSETRON HCL 4 MG/2ML IJ SOLN
4.0000 mg | Freq: Once | INTRAMUSCULAR | Status: AC
  Administered 2018-10-15: 4 mg via INTRAVENOUS

## 2018-10-15 MED ORDER — SODIUM CHLORIDE 0.9 % IV SOLN: INTRAVENOUS | Status: DC

## 2018-10-15 MED ORDER — OXYCODONE-ACETAMINOPHEN 5-325 MG PO TABS
ORAL_TABLET | ORAL | Status: AC
  Filled 2018-10-15: qty 1

## 2018-10-15 MED ORDER — ONDANSETRON HCL 2 MG/ML IV SOLN: 4.0000 mg | Freq: Once | INTRAVENOUS | Status: DC

## 2018-10-15 MED ORDER — DIAZEPAM 5 MG PO TABS
ORAL_TABLET | ORAL | Status: AC
  Administered 2018-10-15: 5 mg via ORAL
  Filled 2018-10-15: qty 1

## 2018-10-15 MED ORDER — DIPHENHYDRAMINE HCL 25 MG PO CAPS
25.0000 mg | ORAL_CAPSULE | ORAL | Status: AC
  Administered 2018-10-15: 25 mg via ORAL

## 2018-10-15 MED ORDER — DIAZEPAM 5 MG PO TABS
5.0000 mg | ORAL_TABLET | ORAL | Status: AC
  Administered 2018-10-15: 5 mg via ORAL

## 2018-10-15 MED ORDER — ONDANSETRON HCL 4 MG/2ML IJ SOLN
INTRAMUSCULAR | Status: AC
  Administered 2018-10-15: 4 mg via INTRAVENOUS
  Filled 2018-10-15: qty 2

## 2018-10-15 NOTE — Discharge Instructions (Signed)

## 2018-10-15 NOTE — H&P (Addendum)
[name] 1946-09-18 [id number]  CC: Right lower pole stones  HPI: [name] is a 72 year old male with history of left-sided shockwave lithotripsy in early November 2019 with subsequent stent removal.  He presents today for shockwave lithotripsy of a nonobstructing right 29mm lower pole stone.  He denies fevers, chills, chest pain.  Most recent imaging renal ultrasound 12/12 shows a non-obstructing 19mm right lower pole stone, no hydronephrosis. Previously imaged 28mm renal pelvis stone from CT no longer visualized.  Urine culture negative 12/17.   PMH: Past Medical History:  Diagnosis Date  . Anxiety   . Arthritis    DDD, lumbar radiculopathy  . Bell's palsy    hx of, right side  . Bell's palsy   . Chronic back pain    followed [facility] clinic [facility]  . Depression    hx of depression  . GERD (gastroesophageal reflux disease)   . Hepatitis    hx of hepatitis C  . History of kidney stones   . Hypertension   . Kidney stone    hx of  . Stroke ([place]) 02/2018   loss of peripheral vision and balance unsteady  . Substance abuse [name])    heroin    Surgical History: Past Surgical History:  Procedure Laterality Date  . ANTERIOR LUMBAR FUSION  05/13/2012   Procedure: ANTERIOR LUMBAR FUSION 1 LEVEL;  Surgeon: Melina Schools, MD;  Location: [place];  Service: Orthopedics;  Laterality: N/A;  ALIF L5-S1  . CHOLECYSTECTOMY    . CYSTOSCOPY W/ RETROGRADES Left 08/04/2018   Procedure: CYSTOSCOPY WITH RETROGRADE PYELOGRAM;  Surgeon: Abbie Sons, MD;  Location: [facility];  Service: Urology;  Laterality: Left;  . CYSTOSCOPY WITH STENT PLACEMENT Left 08/04/2018   Procedure: CYSTOSCOPY WITH STENT PLACEMENT;  Surgeon: Abbie Sons, MD;  Location: [facility];  Service: Urology;  Laterality: Left;  . EXTRACORPOREAL SHOCK WAVE LITHOTRIPSY  2012  . EXTRACORPOREAL SHOCK WAVE LITHOTRIPSY Left 08/20/2018   Procedure: EXTRACORPOREAL SHOCK WAVE LITHOTRIPSY (ESWL);  Surgeon: Hollice Espy,  MD;  Location: [facility];  Service: Urology;  Laterality: Left; (cancelled)  . EXTRACORPOREAL SHOCK WAVE LITHOTRIPSY Left 08/27/2018   Procedure: EXTRACORPOREAL SHOCK WAVE LITHOTRIPSY (ESWL);  Surgeon: Abbie Sons, MD;  Location: [facility];  Service: Urology;  Laterality: Left;  . KIDNEY STONE SURGERY    . LUMBAR FUSION  05/13/2012    Allergies: No Known Allergies  Family History: Family History  Problem Relation Age of Onset  . Stroke Mother   . Diabetes Mother   . Lung cancer Father   . Diabetes Father     Social History:  reports that he has been smoking cigarettes. He has a 26.50 pack-year smoking history. He has never used smokeless tobacco. He reports previous alcohol use. He reports that he does not use drugs.  ROS: Please see flowsheet from today's date for complete review of systems.  Physical Exam: BP (!) 152/66   Pulse 78   Temp 97.9 F (36.6 C) (Tympanic)   Resp 16   Ht 5\' 2"  (1.575 m)   Wt 65.8 kg   SpO2 99%   BMI 26.52 kg/m    Constitutional:  Alert and oriented, No acute distress. Cardiovascular: RRR Respiratory: CTA bilaterally GI: Abdomen is soft, nontender, nondistended, no abdominal masses GU: No CVA tenderness Lymph: No cervical or inguinal lymphadenopathy. Skin: No rashes, bruises or suspicious lesions. Neurologic: Grossly intact, no focal deficits, moving all 4 extremities. Psychiatric: Normal mood and affect.  Laboratory Data: Urine culture 12/17 no growth  Pertinent Imaging: I have personally reviewed the U/S, CT, and KUB, right lower pole stone clearly visible today.  Assessment & Plan:   In summary, [name] is a 72 year old male who desires treatment for non-obstructing right lower pole stone.  Informed consent obtained, we specifically discussed the risks of bleeding, infection, hematoma, [place], and possible need for additional procedures.  Right shockwave lithotripsy today  [place], [place] [address] [place], [place] [address] [phone]

## 2018-10-16 ENCOUNTER — Encounter: Payer: Self-pay | Admitting: Urology

## 2018-10-28 ENCOUNTER — Ambulatory Visit
Admission: RE | Admit: 2018-10-28 | Discharge: 2018-10-28 | Disposition: A | Discharge status: Home / Self Care | Payer: Medicare HMO | Attending: Urology | Admitting: Urology

## 2018-10-28 ENCOUNTER — Ambulatory Visit
Admission: RE | Admit: 2018-10-28 | Discharge: 2018-10-28 | Disposition: A | Discharge status: Home / Self Care | Payer: Medicare HMO | Source: Ambulatory Visit | Attending: Urology | Admitting: Urology

## 2018-10-28 DIAGNOSIS — N200 Calculus of kidney: Secondary | ICD-10-CM | POA: Diagnosis present

## 2018-10-30 ENCOUNTER — Encounter: Payer: Self-pay | Admitting: Urology

## 2018-10-30 ENCOUNTER — Other Ambulatory Visit: Payer: Self-pay

## 2018-10-30 ENCOUNTER — Ambulatory Visit (INDEPENDENT_AMBULATORY_CARE_PROVIDER_SITE_OTHER): Payer: Medicare HMO | Admitting: Urology

## 2018-10-30 VITALS — BP 168/82 | HR 77 | Ht 62.0 in | Wt 146.0 lb

## 2018-10-30 DIAGNOSIS — N200 Calculus of kidney: Secondary | ICD-10-CM

## 2018-10-30 NOTE — Progress Notes (Signed)
10/30/2018 1:23 PM   [name] [birth year] [id number]  Referring provider: Tracie Harrier, [facility] [place], [place] [address]  Chief Complaint  Patient presents with  . Nephrolithiasis    HPI: 73 year old male presents for postop follow-up.  He underwent shockwave lithotripsy of 5 x 8 mm and 3 x 4 mm right renal calculi my Dr. Diamantina Providence on 10/15/2018.  He had no postoperative problems.  He brings in several fragments today which he has passed.  He has minimal right lower quadrant discomfort.  He denies left flank or abdominal pain.  KUB performed earlier this week shows no definite right-sided fragments although there is a moderate amount of stool and bowel gas obscuring the upper abdominal area.  He has had prior shockwave lithotripsy of a left renal calculus and had some residual lower pole fragments.  1 of these fragments may be in the proximal ureter.   PMH: Past Medical History:  Diagnosis Date  . Anxiety   . Arthritis    DDD, lumbar radiculopathy  . Bell's palsy    hx of, right side  . Bell's palsy   . Chronic back pain    followed by [facility] clinic at [facility]  . Depression    hx of depression  . GERD (gastroesophageal reflux disease)   . Hepatitis    hx of hepatitis C  . History of kidney stones   . Hypertension   . Kidney stone    hx of  . Stroke ([place]) 02/2018   loss of peripheral vision and balance unsteady  . Substance abuse [facility])    heroin    Surgical History: Past Surgical History:  Procedure Laterality Date  . ANTERIOR LUMBAR FUSION  05/13/2012   Procedure: ANTERIOR LUMBAR FUSION 1 LEVEL;  Surgeon: Melina Schools, MD;  Location: [place];  Service: Orthopedics;  Laterality: N/A;  ALIF L5-S1  . CHOLECYSTECTOMY    . CYSTOSCOPY W/ RETROGRADES Left 08/04/2018   Procedure: CYSTOSCOPY WITH RETROGRADE PYELOGRAM;  Surgeon: Abbie Sons, MD;  Location: [facility];  Service: Urology;  Laterality: Left;  . CYSTOSCOPY WITH  STENT PLACEMENT Left 08/04/2018   Procedure: CYSTOSCOPY WITH STENT PLACEMENT;  Surgeon: Abbie Sons, MD;  Location: [facility];  Service: Urology;  Laterality: Left;  . EXTRACORPOREAL SHOCK WAVE LITHOTRIPSY  2012  . EXTRACORPOREAL SHOCK WAVE LITHOTRIPSY Left 08/20/2018   Procedure: EXTRACORPOREAL SHOCK WAVE LITHOTRIPSY (ESWL);  Surgeon: Hollice Espy, MD;  Location: [facility];  Service: Urology;  Laterality: Left; (cancelled)  . EXTRACORPOREAL SHOCK WAVE LITHOTRIPSY Left 08/27/2018   Procedure: EXTRACORPOREAL SHOCK WAVE LITHOTRIPSY (ESWL);  Surgeon: Abbie Sons, MD;  Location: [facility];  Service: Urology;  Laterality: Left;  . EXTRACORPOREAL SHOCK WAVE LITHOTRIPSY Right 10/15/2018   Procedure: EXTRACORPOREAL SHOCK WAVE LITHOTRIPSY (ESWL);  Surgeon: Billey Co, MD;  Location: [facility];  Service: Urology;  Laterality: Right;  . KIDNEY STONE SURGERY    . LUMBAR FUSION  05/13/2012    Home Medications:  Allergies as of 10/30/2018   No Known Allergies     Medication List       Accurate as of October 30, 2018  1:23 PM. Always use your most recent med list.        aspirin EC 81 MG tablet Take 1 tablet (81 mg total) by mouth daily.   atorvastatin 40 MG tablet Commonly known as:  LIPITOR Take 1 tablet (40 mg total) by mouth daily at 6 PM.   cloNIDine  0.1 MG tablet Commonly known as:  CATAPRES Take 0.1 mg by mouth daily.   clopidogrel 75 MG tablet Commonly known as:  PLAVIX Take 1 tablet (75 mg total) by mouth daily.   gabapentin 300 MG capsule Commonly known as:  NEURONTIN Take by mouth.   LORazepam 0.5 MG tablet Commonly known as:  ATIVAN Take 0.5 mg by mouth daily as needed for anxiety. Reported on 02/26/2016   tamsulosin 0.4 MG Caps capsule Commonly known as:  FLOMAX Take 1 capsule (0.4 mg total) by mouth daily after supper.   tiZANidine 4 MG tablet Commonly known as:  ZANAFLEX Take 4 mg by mouth 2 (two) times daily.   traMADol-acetaminophen 37.5-325 MG  tablet Commonly known as:  ULTRACET Take 1 tablet by mouth every 8 (eight) hours as needed for moderate pain.   traZODone 150 MG tablet Commonly known as:  DESYREL Take 150 mg by mouth at bedtime.   vitamin B-12 500 MCG tablet Commonly known as:  CYANOCOBALAMIN Take 500 mcg by mouth daily.       Allergies: No Known Allergies  Family History: Family History  Problem Relation Age of Onset  . Stroke Mother   . Diabetes Mother   . Lung cancer Father   . Diabetes Father     Social History:  reports that he has been smoking cigarettes. He has a 26.50 pack-year smoking history. He has never used smokeless tobacco. He reports previous alcohol use. He reports that he does not use drugs.  ROS: UROLOGY Frequent Urination?: No Hard to postpone urination?: No Burning/pain with urination?: No Get up at night to urinate?: No Leakage of urine?: No Urine stream starts and stops?: No Trouble starting stream?: No Do you have to strain to urinate?: No Blood in urine?: No Urinary tract infection?: No Sexually transmitted disease?: No Injury to kidneys or bladder?: No Painful intercourse?: No Weak stream?: No Erection problems?: Yes Penile pain?: No  Gastrointestinal Nausea?: No Vomiting?: No Indigestion/heartburn?: No Diarrhea?: No Constipation?: No  Constitutional Fever: No Night sweats?: No Weight loss?: No Fatigue?: No  Skin Skin rash/lesions?: No Itching?: No  Eyes Blurred vision?: Yes Double vision?: No  Ears/Nose/Throat Sore throat?: No Sinus problems?: No  Hematologic/Lymphatic Swollen glands?: No Easy bruising?: No  Cardiovascular Leg swelling?: No Chest pain?: No  Respiratory Cough?: No Shortness of breath?: No  Endocrine Excessive thirst?: No  Musculoskeletal Back pain?: Yes Joint pain?: No  Neurological Headaches?: No Dizziness?: No  Psychologic Depression?: Yes Anxiety?: Yes  Physical Exam: BP (!) 168/82   Pulse 77   Ht 5'  2" (1.575 m)   Wt 146 lb (66.2 kg)   BMI 26.70 kg/m   Constitutional:  Alert and oriented, No acute distress.   Pertinent Imaging: Images were personally reviewed Results for orders placed during the hospital encounter of 10/28/18  Abdomen 1 view (KUB)   Narrative CLINICAL DATA:  Right renal calculus.  Right groin pain.  EXAM: ABDOMEN - 1 VIEW  COMPARISON:  CT 06/24/2018.  Radiographs 09/23/2018 and 10/15/2018.  FINDINGS: No definite residual calcifications are seen over the right kidney or expected course of the right ureter, although the right kidney is partly obscured by bowel gas. Small calculi are again seen overlying the lower pole of the left kidney. There is a new calculus between the left L4 and L5 transverse processes, suspicious for a proximal left ureteral calculus. No new calcifications are seen within the pelvis. Previous cholecystectomy and lumbosacral fusion noted.  IMPRESSION: 1. One  of the left-sided calculi has moved, and may be in the proximal left ureter. 2. No definite right-sided urinary tract calculi, although the right kidney is partly obscured by bowel gas.   Electronically Signed   By: Richardean Sale M.D.   On: 10/28/2018 17:16     Assessment & Plan:   Good results status post right shockwave lithotripsy.  He may have a left-sided fragment in the proximal ureter.  He is asymptomatic.  Schedule follow-up KUB/renal ultrasound approximately 1 month.  He is instructed to call earlier for development of significant pain.  Abbie Sons, [place] [address] [place], [place] [address] [phone]

## 2018-11-04 ENCOUNTER — Other Ambulatory Visit: Payer: Self-pay | Admitting: Urology

## 2018-11-06 ENCOUNTER — Other Ambulatory Visit: Payer: Self-pay | Admitting: Family Medicine

## 2018-11-06 MED ORDER — TAMSULOSIN HCL 0.4 MG PO CAPS: 0.4000 mg | ORAL_CAPSULE | Freq: Every day | ORAL | 3 refills | Status: DC

## 2018-11-23 ENCOUNTER — Other Ambulatory Visit (INDEPENDENT_AMBULATORY_CARE_PROVIDER_SITE_OTHER): Payer: Self-pay | Admitting: Vascular Surgery

## 2018-11-23 DIAGNOSIS — H538 Other visual disturbances: Secondary | ICD-10-CM

## 2018-11-23 DIAGNOSIS — Z8673 Personal history of transient ischemic attack (TIA), and cerebral infarction without residual deficits: Secondary | ICD-10-CM

## 2018-11-24 ENCOUNTER — Ambulatory Visit (INDEPENDENT_AMBULATORY_CARE_PROVIDER_SITE_OTHER): Payer: Medicare HMO | Admitting: Vascular Surgery

## 2018-11-24 ENCOUNTER — Encounter (INDEPENDENT_AMBULATORY_CARE_PROVIDER_SITE_OTHER): Payer: Self-pay | Admitting: Vascular Surgery

## 2018-11-24 ENCOUNTER — Ambulatory Visit (INDEPENDENT_AMBULATORY_CARE_PROVIDER_SITE_OTHER): Payer: Medicare HMO

## 2018-11-24 VITALS — BP 117/70 | HR 64 | Resp 16 | Ht 62.0 in | Wt 149.0 lb

## 2018-11-24 DIAGNOSIS — H538 Other visual disturbances: Secondary | ICD-10-CM

## 2018-11-24 DIAGNOSIS — I6523 Occlusion and stenosis of bilateral carotid arteries: Secondary | ICD-10-CM | POA: Diagnosis not present

## 2018-11-24 DIAGNOSIS — Z8673 Personal history of transient ischemic attack (TIA), and cerebral infarction without residual deficits: Secondary | ICD-10-CM | POA: Diagnosis not present

## 2018-11-24 DIAGNOSIS — H539 Unspecified visual disturbance: Secondary | ICD-10-CM

## 2018-11-24 DIAGNOSIS — I739 Peripheral vascular disease, unspecified: Secondary | ICD-10-CM | POA: Diagnosis not present

## 2018-11-24 DIAGNOSIS — I639 Cerebral infarction, unspecified: Secondary | ICD-10-CM | POA: Diagnosis not present

## 2018-11-24 DIAGNOSIS — Z720 Tobacco use: Secondary | ICD-10-CM

## 2018-11-24 DIAGNOSIS — E785 Hyperlipidemia, unspecified: Secondary | ICD-10-CM

## 2018-11-25 ENCOUNTER — Telehealth: Payer: Self-pay | Admitting: Radiology

## 2018-11-25 ENCOUNTER — Encounter (INDEPENDENT_AMBULATORY_CARE_PROVIDER_SITE_OTHER): Payer: Self-pay | Admitting: Vascular Surgery

## 2018-11-25 DIAGNOSIS — E785 Hyperlipidemia, unspecified: Secondary | ICD-10-CM | POA: Insufficient documentation

## 2018-11-25 DIAGNOSIS — I6523 Occlusion and stenosis of bilateral carotid arteries: Secondary | ICD-10-CM | POA: Insufficient documentation

## 2018-11-25 NOTE — Telephone Encounter (Signed)
Patient reports groin pain & asks for pain medication. He does not have a follow up appointment. Does this need to be scheduled? He has an upcoming KUB & ultrasound.

## 2018-11-25 NOTE — Progress Notes (Signed)
Subjective:    Patient ID: [name], male    DOB: 12-14-1945, 73 y.o.   MRN: [id number] Chief Complaint  Patient presents with  . Follow-up   Patient last seen on 04/17/17 for evaluation of lower extremity discomfort / PAD.  The patient has not followed up with [place] since then.  The patient presents today requesting a carotid artery duplex.  The patient has a history of CVAs and subsequent vision loss.  On Mar 13, 2018, after his most recent CVA the patient underwent a CTA of the neck which was notable for: 1. Acute/subacute right medial occipital lobe PCA territory nonhemorrhagic infarct. 2. 50% stenosis of the proximal left internal carotid artery. 3. Mild tortuosity of the cervical internal carotid arteries bilaterally without other significant stenosis. This is most commonly seen in the setting of chronic hypertension. 4. Proximal inferior division right M2 segment stenosis with more normal distal opacification. 5. Moderate stenosis of the distal right P2 segment likely accounting for the area of infarction. No significant occlusion is evident. 6. Diffuse small vessel disease compatible with intracranial atherosclerosis. 7. Bilateral posterior parotid hyperdense lesions. These appear benign and likely represent Warthin's tumors. The patient denies any amaurosis fugax, new/worsening neurological deficits or TIAs.  The patient presents wanting a carotid artery duplex completed to assess "the arteries in his neck" to "get his vision back".  The patient underwent a bilateral carotid duplex which was notable for velocities on the low end of the 1 to 39% stenosis.  Bilateral vertebral arteries demonstrate antegrade flow.  Normal flow hemodynamics were seen in the bilateral subclavian arteries.  The patient denies any claudication-like symptoms, rest pain or ulcer formation to the bilateral lower extremity.  Patient denies any fever, nausea vomiting.  Review of Systems  Constitutional: Negative.     HENT:       Vision Loss  Eyes: Negative.   Respiratory: Negative.   Cardiovascular: Negative.   Gastrointestinal: Negative.   Endocrine: Negative.   Genitourinary: Negative.   Musculoskeletal: Negative.   Skin: Negative.   Allergic/Immunologic: Negative.   Neurological: Negative.   Hematological: Negative.   Psychiatric/Behavioral: Negative.       Objective:   Physical Exam Vitals signs reviewed.  Constitutional:      Appearance: Normal appearance.  HENT:     Head: Normocephalic and atraumatic.     Right Ear: External ear normal.     Left Ear: External ear normal.     Nose: Nose normal.     Mouth/Throat:     Mouth: Mucous membranes are moist.     Pharynx: Oropharynx is clear.  Eyes:     Extraocular Movements: Extraocular movements intact.     Conjunctiva/sclera: Conjunctivae normal.     Pupils: Pupils are equal, round, and reactive to light.  Neck:     Musculoskeletal: Normal range of motion.     Comments: No carotid bruits noted on exam Cardiovascular:     Rate and Rhythm: Normal rate and regular rhythm.     Pulses: Normal pulses.     Heart sounds: Normal heart sounds.  Pulmonary:     Effort: Pulmonary effort is normal.     Breath sounds: Normal breath sounds.  Musculoskeletal: Normal range of motion.        General: No swelling.  Skin:    General: Skin is warm and dry.  Neurological:     General: No focal deficit present.     Mental Status: He is alert and oriented  to person, place, and time. Mental status is at baseline.  Psychiatric:        Mood and Affect: Mood normal.        Behavior: Behavior normal.        Thought Content: Thought content normal.        Judgment: Judgment normal.    BP 117/70 (BP Location: Right Arm, Patient Position: Sitting, Cuff Size: Normal)   Pulse 64   Resp 16   Ht 5\' 2"  (1.575 m)   Wt 149 lb (67.6 kg)   BMI 27.25 kg/m   Past Medical History:  Diagnosis Date  . Anxiety   . Arthritis    DDD, lumbar radiculopathy  .  Bell's palsy    hx of, right side  . Bell's palsy   . Chronic back pain    followed by pain clinic at [facility]  . Depression    hx of depression  . GERD (gastroesophageal reflux disease)   . Hepatitis    hx of hepatitis C  . History of kidney stones   . Hypertension   . Kidney stone    hx of  . Stroke ([place]) 02/2018   loss of peripheral vision and balance unsteady  . Substance abuse ([place])    heroin   Social History   Socioeconomic History  . Marital status: Married    Spouse name: Pamala Hurry  . Number of children: Not on file  . Years of education: Not on file  . Highest education level: Not on file  Occupational History  . Not on file  Social Needs  . Financial resource strain: Not very hard  . Food insecurity:    Worry: Patient refused    Inability: Patient refused  . Transportation needs:    Medical: Patient refused    Non-medical: Patient refused  Tobacco Use  . Smoking status: Current Every Day Smoker    Packs/day: 0.50    Years: 53.00    Pack years: 26.50    Types: Cigarettes  . Smokeless tobacco: Never Used  Substance and Sexual Activity  . Alcohol use: Not Currently    Alcohol/week: 0.0 standard drinks  . Drug use: No    Types: Heroin, Cocaine, Marijuana    Comment: 1960's  . Sexual activity: Yes  Lifestyle  . Physical activity:    Days per week: Patient refused    Minutes per session: Patient refused  . Stress: To some extent  Relationships  . Social connections:    Talks on phone: Patient refused    Gets together: Patient refused    Attends religious service: Patient refused    Active member of club or organization: Patient refused    Attends meetings of clubs or organizations: Patient refused    Relationship status: Patient refused  . Intimate partner violence:    Fear of current or ex partner: Patient refused    Emotionally abused: Patient refused    Physically abused: Patient refused    Forced sexual activity: Patient refused  Other Topics  Concern  . Not on file  Social History Narrative   ** Merged History Encounter **       Past Surgical History:  Procedure Laterality Date  . ANTERIOR LUMBAR FUSION  05/13/2012   Procedure: ANTERIOR LUMBAR FUSION 1 LEVEL;  Surgeon: Melina Schools, MD;  Location: [place];  Service: Orthopedics;  Laterality: N/A;  ALIF L5-S1  . CHOLECYSTECTOMY    . CYSTOSCOPY W/ RETROGRADES Left 08/04/2018   Procedure: CYSTOSCOPY WITH RETROGRADE  PYELOGRAM;  Surgeon: Abbie Sons, MD;  Location: [facility];  Service: Urology;  Laterality: Left;  . CYSTOSCOPY WITH STENT PLACEMENT Left 08/04/2018   Procedure: CYSTOSCOPY WITH STENT PLACEMENT;  Surgeon: Abbie Sons, MD;  Location: [facility];  Service: Urology;  Laterality: Left;  . EXTRACORPOREAL SHOCK WAVE LITHOTRIPSY  2012  . EXTRACORPOREAL SHOCK WAVE LITHOTRIPSY Left 08/20/2018   Procedure: EXTRACORPOREAL SHOCK WAVE LITHOTRIPSY (ESWL);  Surgeon: Hollice Espy, MD;  Location: [facility];  Service: Urology;  Laterality: Left; (cancelled)  . EXTRACORPOREAL SHOCK WAVE LITHOTRIPSY Left 08/27/2018   Procedure: EXTRACORPOREAL SHOCK WAVE LITHOTRIPSY (ESWL);  Surgeon: Abbie Sons, MD;  Location: [facility];  Service: Urology;  Laterality: Left;  . EXTRACORPOREAL SHOCK WAVE LITHOTRIPSY Right 10/15/2018   Procedure: EXTRACORPOREAL SHOCK WAVE LITHOTRIPSY (ESWL);  Surgeon: Billey Co, MD;  Location: [facility];  Service: Urology;  Laterality: Right;  . KIDNEY STONE SURGERY    . LUMBAR FUSION  05/13/2012   Family History  Problem Relation Age of Onset  . Stroke Mother   . Diabetes Mother   . Lung cancer Father   . Diabetes Father    No Known Allergies     Assessment & Plan:  Patient last seen on 04/17/17 for evaluation of lower extremity discomfort / PAD.  The patient has not followed up with [place] since then.  The patient presents today requesting a carotid artery duplex.  The patient has a history of CVAs and subsequent vision loss.  On Mar 13, 2018, after his  most recent CVA the patient underwent a CTA of the neck which was notable for: 1. Acute/subacute right medial occipital lobe PCA territory nonhemorrhagic infarct. 2. 50% stenosis of the proximal left internal carotid artery. 3. Mild tortuosity of the cervical internal carotid arteries bilaterally without other significant stenosis. This is most commonly seen in the setting of chronic hypertension. 4. Proximal inferior division right M2 segment stenosis with more normal distal opacification. 5. Moderate stenosis of the distal right P2 segment likely accounting for the area of infarction. No significant occlusion is evident. 6. Diffuse small vessel disease compatible with intracranial atherosclerosis. 7. Bilateral posterior parotid hyperdense lesions. These appear benign and likely represent Warthin's tumors. The patient denies any amaurosis fugax, new/worsening neurological deficits or TIAs.  The patient presents wanting a carotid artery duplex completed to assess "the arteries in his neck" to "get his vision back".  The patient underwent a bilateral carotid duplex which was notable for velocities on the low end of the 1 to 39% stenosis.  Bilateral vertebral arteries demonstrate antegrade flow.  Normal flow hemodynamics were seen in the bilateral subclavian arteries.  The patient denies any claudication-like symptoms, rest pain or ulcer formation to the bilateral lower extremity.  Patient denies any fever, nausea vomiting.  1. PAD (peripheral artery disease) ([place]) - Stable The patient was originally seen in June 2018 and underwent a work-up for peripheral artery disease The patient never followed up after that appointment The patient presents today without complaint denies any lower extremity claudication, rest pain ulcer formation to the bilateral legs I have discussed with the patient at length the risk factors for and pathogenesis of atherosclerotic disease and encouraged a healthy diet, regular  exercise regimen and blood pressure / glucose control.  The patient was encouraged to call the office in the interim if he experiences any claudication like symptoms, rest pain or ulcers to his feet / toes.  2. Cerebrovascular accident (CVA), unspecified mechanism ([place]) - Stable  Patient has a history of multiple CVAs in the past Minimal carotid disease and is neck was found on both duplex and CTA. I do not feel that the patient's CVAs are stemming from his mild carotid artery stenosis  3. Visual disturbance - Stable Patient with minimal carotid artery disease. Patient does have a past medical history of CVAs and subsequent vision loss I do not feel that the patient's vision loss was due to his minimal carotid stenosis Encourage the patient to follow-up with a neurologist or ophthalmologist for further work-up  4. Current tobacco use - Stable We had a discussion for approximately three minutes regarding the absolute need for smoking cessation due to the deleterious nature of tobacco on the vascular system. We discussed the tobacco use would diminish patency of any intervention, and likely significantly worsen progressio of disease. We discussed multiple agents for quitting including replacement therapy or medications to reduce cravings such as Chantix. The patient voices their understanding of the importance of smoking cessation.  5. Hyperlipidemia, unspecified hyperlipidemia type - Stable On ASA and statin Encouraged good control as its slows the progression of atherosclerotic disease  6. Bilateral carotid artery stenosis Studies reviewed with patient. Patient asymptomatic with stable duplex. No intervention at this time. Patient to return in two year for surveillance carotid duplex. Patient to continue medical optimization with ASA and dyslipidemia medication. I have discussed with the patient at length the risk factors for and pathogenesis of atherosclerotic disease and encouraged a  healthy diet, regular exercise regimen and blood pressure / glucose control.  Patient was instructed to contact our office in the interim with problems such as arm / leg weakness or numbness, speech / swallowing difficulty or temporary monocular blindness. The patient expresses their understanding.  - VAS US CAROTID; Future  Current Outpatient Medications on File Prior to Visit  Medication Sig Dispense Refill  . aspirin EC 81 MG tablet Take 1 tablet (81 mg total) by mouth daily. 30 tablet 0  . cloNIDine (CATAPRES) 0.1 MG tablet Take 0.1 mg by mouth daily.     . clopidogrel (PLAVIX) 75 MG tablet Take 1 tablet (75 mg total) by mouth daily. 90 tablet 0  . LORazepam (ATIVAN) 0.5 MG tablet Take 0.5 mg by mouth daily as needed for anxiety. Reported on 02/26/2016    . tiZANidine (ZANAFLEX) 4 MG tablet Take 4 mg by mouth 2 (two) times daily.     . traMADol-acetaminophen (ULTRACET) 37.5-325 MG tablet Take 1 tablet by mouth every 8 (eight) hours as needed for moderate pain. 15 tablet 0  . traZODone (DESYREL) 150 MG tablet Take 150 mg by mouth at bedtime.     Marland Kitchen atorvastatin (LIPITOR) 40 MG tablet Take 1 tablet (40 mg total) by mouth daily at 6 PM. 30 tablet 0   Current Facility-Administered Medications on File Prior to Visit  Medication Dose Route Frequency Provider Last Rate Last Dose  . lidocaine (XYLOCAINE) 2 % jelly 1 application  1 application Urethral Once Stoioff, Ronda Fairly, MD       There are no Patient Instructions on file for this visit. No follow-ups on file.  Alwyn Cordner A Francis Doenges, PA-C

## 2018-12-01 ENCOUNTER — Ambulatory Visit: Admission: RE | Admit: 2018-12-01 | Payer: Medicare HMO | Source: Ambulatory Visit

## 2019-02-16 ENCOUNTER — Ambulatory Visit (INDEPENDENT_AMBULATORY_CARE_PROVIDER_SITE_OTHER): Payer: Medicare HMO | Admitting: Psychiatry

## 2019-02-16 ENCOUNTER — Other Ambulatory Visit: Payer: Self-pay

## 2019-02-16 ENCOUNTER — Encounter: Payer: Self-pay | Admitting: Psychiatry

## 2019-02-16 DIAGNOSIS — G4700 Insomnia, unspecified: Secondary | ICD-10-CM

## 2019-02-16 DIAGNOSIS — F3130 Bipolar disorder, current episode depressed, mild or moderate severity, unspecified: Secondary | ICD-10-CM | POA: Diagnosis not present

## 2019-02-16 DIAGNOSIS — F410 Panic disorder [episodic paroxysmal anxiety] without agoraphobia: Secondary | ICD-10-CM | POA: Diagnosis not present

## 2019-02-16 DIAGNOSIS — F411 Generalized anxiety disorder: Secondary | ICD-10-CM

## 2019-02-16 DIAGNOSIS — F17200 Nicotine dependence, unspecified, uncomplicated: Secondary | ICD-10-CM

## 2019-02-16 MED ORDER — TRAZODONE HCL 150 MG PO TABS: 150.0000 mg | ORAL_TABLET | Freq: Every day | ORAL | 0 refills | Status: DC

## 2019-02-16 MED ORDER — LAMOTRIGINE 25 MG PO TABS: 25.0000 mg | ORAL_TABLET | ORAL | 1 refills | Status: DC

## 2019-02-16 MED ORDER — HYDROXYZINE PAMOATE 25 MG PO CAPS: 25.0000 mg | ORAL_CAPSULE | Freq: Two times a day (BID) | ORAL | 1 refills | Status: DC | PRN

## 2019-02-16 NOTE — Progress Notes (Signed)
TC 02-16-19 @ 1:00 medical and surgical hx reviewed with updates. Medications and pharmacy reviewed and updated. Allergies reviewed with no changes. No vital taken due to this is a phone visit.

## 2019-02-16 NOTE — Progress Notes (Signed)
Virtual Visit via Video Note  I connected with [name] on 02/16/19 at  2:30 PM EDT by a video enabled telemedicine application and verified that I am speaking with the correct person using two identifiers.   I discussed the limitations of evaluation and management by telemedicine and the availability of in person appointments. The patient expressed understanding and agreed to proceed.   I discussed the assessment and treatment plan with the patient. The patient was provided an opportunity to ask questions and all were answered. The patient agreed with the plan and demonstrated an understanding of the instructions.   The patient was advised to call back or seek an in-person evaluation if the symptoms worsen or if the condition fails to improve as anticipated.    Psychiatric Initial Adult Assessment   Patient Identification: [name] MRN:  [id number] Date of Evaluation:  02/16/2019 Referral Source:Vishwanath Hande MD Chief Complaint:   Chief Complaint    Establish Care; Anxiety; Depression; Insomnia; Pain     Visit Diagnosis:    ICD-10-CM   1. Bipolar I disorder, most recent episode depressed ([place]) F31.30 traZODone (DESYREL) 150 MG tablet    lamoTRIgine (LAMICTAL) 25 MG tablet  2. Generalized anxiety disorder F41.1   3. Panic attacks F41.0 hydrOXYzine (VISTARIL) 25 MG capsule  4. Insomnia, unspecified type G47.00   5. Tobacco use disorder F17.200     History of Present Illness:  [name] is a 73 year old African-American male, divorced, currently lives in [place], has a history of chronic pain, right medial occipital lobe CVA, partial loss of vision, history of hepatitis C, hypertension, tobacco use disorder, was evaluated by telemedicine today.  Patient reports he does have a history of bipolar disorder.  He reports he has had episodes of manic and hypomanic episodes several years ago.  He reports he is always angry all the time.  He struggles with irritability and anger  issues currently on a regular basis.  He is unable to control his anger often.  He reports along with his anger issues he also struggles with a lot of depressive symptoms at this time.  He reports sadness, lack of motivation, inability to concentrate as well as sleep issues.  This has been getting worse ever since his stroke which happened in May 2019.  He reports he does not take any medications for his mood lability except for lorazepam which he reports was prescribed for his seizures which happen after his stroke.  He does have an upcoming appointment with neurology for his seizures on May 5.  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  He reports his worrying became bad after his CVA.  He worries about what will happen to his 26 year old daughter if something happens, he worries about his own health issues, he worries about having another stroke.  He reports there are times when he struggles with panic symptoms.  He feels jittery, shaky and feels extremely restless.  This has been going on since the past several months.  Patient reports he did have sleep issues however he is on trazodone now which helps.  Patient denies any auditory or visual hallucinations.  Patient however reports he does have some paranoia on and off.  He currently denies it.  Patient denies any history of trauma.  Patient denies any substance abuse problems.  He does smoke cigarettes and is trying to cut down at this time.    Associated Signs/Symptoms: Depression Symptoms:  depressed mood, psychomotor agitation, difficulty concentrating, anxiety, panic attacks, (Hypo)  Manic Symptoms:  Distractibility, Impulsivity, Irritable Mood, Labiality of Mood, Anxiety Symptoms:  Excessive Worry, Panic Symptoms, Psychotic Symptoms:  Paranoia, on and off  PTSD Symptoms: Negative  Past Psychiatric History: Patient reports a history of bipolar disorder diagnosed several years ago.  Patient reports that in 2003 he had a nervous breakdown  and was admitted to the hospital at [facility].  He also reports another admission in [place] at the inpatient unit for a suicide attempt several years ago.  Patient reports a previous diagnosis of bipolar disorder and sleep problems.  Patient reports he was referred to the clinic by his primary medical doctor since his provider felt he could not manage his anxiety symptoms.  Previous Psychotropic Medications: Yes  previous trials of Cymbalta by pain management-reports he did not take it, Seroquel, Ativan, Ambien, trazodone  Substance Abuse History in the last 12 months:  No.  Consequences of Substance Abuse: Negative  Past Medical History:  Past Medical History:  Diagnosis Date  . Anxiety   . Arthritis    DDD, lumbar radiculopathy  . Bell's palsy    hx of, right side  . Bell's palsy   . Chronic back pain    followed [facility] pain clinic at [facility]  . Depression    hx of depression  . GERD (gastroesophageal reflux disease)   . Hepatitis    hx of hepatitis C  . History of kidney stones   . Hypertension   . Kidney stone    hx of  . Stroke ([place]) 02/2018   loss of peripheral vision and balance unsteady  . Substance abuse [facility])    heroin    Past Surgical History:  Procedure Laterality Date  . ANTERIOR LUMBAR FUSION  05/13/2012   Procedure: ANTERIOR LUMBAR FUSION 1 LEVEL;  Surgeon: Melina Schools, MD;  Location: [place];  Service: Orthopedics;  Laterality: N/A;  ALIF L5-S1  . CHOLECYSTECTOMY    . CYSTOSCOPY W/ RETROGRADES Left 08/04/2018   Procedure: CYSTOSCOPY WITH RETROGRADE PYELOGRAM;  Surgeon: Abbie Sons, MD;  Location: [facility];  Service: Urology;  Laterality: Left;  . CYSTOSCOPY WITH STENT PLACEMENT Left 08/04/2018   Procedure: CYSTOSCOPY WITH STENT PLACEMENT;  Surgeon: Abbie Sons, MD;  Location: [facility];  Service: Urology;  Laterality: Left;  . EXTRACORPOREAL SHOCK WAVE LITHOTRIPSY  2012  . EXTRACORPOREAL SHOCK WAVE LITHOTRIPSY Left 08/20/2018   Procedure:  EXTRACORPOREAL SHOCK WAVE LITHOTRIPSY (ESWL);  Surgeon: Hollice Espy, MD;  Location: [facility];  Service: Urology;  Laterality: Left; (cancelled)  . EXTRACORPOREAL SHOCK WAVE LITHOTRIPSY Left 08/27/2018   Procedure: EXTRACORPOREAL SHOCK WAVE LITHOTRIPSY (ESWL);  Surgeon: Abbie Sons, MD;  Location: [facility];  Service: Urology;  Laterality: Left;  . EXTRACORPOREAL SHOCK WAVE LITHOTRIPSY Right 10/15/2018   Procedure: EXTRACORPOREAL SHOCK WAVE LITHOTRIPSY (ESWL);  Surgeon: Billey Co, MD;  Location: [facility];  Service: Urology;  Laterality: Right;  . KIDNEY STONE SURGERY    . LUMBAR FUSION  05/13/2012    Family Psychiatric History: Patient denies mental health problems in his family.  Family History:  Family History  Problem Relation Age of Onset  . Stroke Mother   . Diabetes Mother   . Lung cancer Father   . Diabetes Father     Social History:   Social History   Socioeconomic History  . Marital status: Divorced    Spouse name: Not on file  . Number of children: 2  . Years of education: Not on file  . Highest education  level: Associate degree: occupational, Hotel manager, or vocational program  Occupational History  . Not on file  Social Needs  . Financial resource strain: Not hard at all  . Food insecurity:    Worry: Never true    Inability: Never true  . Transportation needs:    Medical: Yes    Non-medical: Yes  Tobacco Use  . Smoking status: Current Every Day Smoker    Packs/day: 0.50    Years: 53.00    Pack years: 26.50    Types: Cigarettes  . Smokeless tobacco: Never Used  Substance and Sexual Activity  . Alcohol use: Not Currently    Alcohol/week: 0.0 standard drinks  . Drug use: Not Currently    Types: Heroin, Cocaine, Marijuana    Comment: 1960's  . Sexual activity: Yes  Lifestyle  . Physical activity:    Days per week: 0 days    Minutes per session: 0 min  . Stress: Not at all  Relationships  . Social connections:    Talks on phone: Not on file     Gets together: Not on file    Attends religious service: Never    Active member of club or organization: No    Attends meetings of clubs or organizations: Never    Relationship status: Divorced  Other Topics Concern  . Not on file  Social History Narrative   ** Merged History Encounter **        Additional Social History: Patient reports he currently lives in the same house with his ex-wife.  They have been divorced since the past several years.  Patient reports he has 2 daughters-7 year old and 39 year old.  He also has grandchildren.  His 39 year old stays in the same house.  Patient used to work as a Programmer, applications, has 2 years of college.  He is currently retired.  He is on SSD.  His ex-wife is also on SSI.  Allergies:  No Known Allergies  Metabolic Disorder Labs: No results found for: HGBA1C, MPG No results found for: PROLACTIN Lab Results  Component Value Date   CHOL 200 03/13/2018   TRIG 247 (H) 03/13/2018   HDL 20 (L) 03/13/2018   CHOLHDL 10.0 03/13/2018   VLDL 49 (H) 03/13/2018   LDLCALC 131 (H) 03/13/2018   No results found for: TSH  Therapeutic Level Labs: No results found for: LITHIUM No results found for: CBMZ No results found for: VALPROATE  Current Medications: Current Outpatient Medications  Medication Sig Dispense Refill  . aspirin EC 81 MG tablet Take 1 tablet (81 mg total) by mouth daily. 30 tablet 0  . atorvastatin (LIPITOR) 40 MG tablet Take 1 tablet (40 mg total) by mouth daily at 6 PM. 30 tablet 0  . cloNIDine (CATAPRES) 0.1 MG tablet Take 0.1 mg by mouth daily.     . clopidogrel (PLAVIX) 75 MG tablet Take 1 tablet (75 mg total) by mouth daily. 90 tablet 0  . LORazepam (ATIVAN) 0.5 MG tablet Take 0.5 mg by mouth daily as needed for anxiety. Reported on 02/26/2016    . tiZANidine (ZANAFLEX) 4 MG tablet Take 4 mg by mouth 2 (two) times daily.     . traMADol (ULTRAM) 50 MG tablet Take by mouth.    . traMADol-acetaminophen (ULTRACET) 37.5-325 MG  tablet Take 1 tablet by mouth every 8 (eight) hours as needed for moderate pain. 15 tablet 0  . traZODone (DESYREL) 150 MG tablet Take 1 tablet (150 mg total) by mouth at bedtime. 90 tablet 0  .  hydrOXYzine (VISTARIL) 25 MG capsule Take 1 capsule (25 mg total) by mouth 2 (two) times daily as needed. For severe anxiety symptoms 60 capsule 1  . lamoTRIgine (LAMICTAL) 25 MG tablet Take 1 tablet (25 mg total) by mouth as directed. Take 1 tablet for 2 weeks and increase to 2 tablets 60 tablet 1   Current Facility-Administered Medications  Medication Dose Route Frequency Provider Last Rate Last Dose  . lidocaine (XYLOCAINE) 2 % jelly 1 application  1 application Urethral Once Stoioff, Ronda Fairly, MD        Musculoskeletal: Strength & Muscle Tone: UTA Gait & Station: unsteady due to stroke  Patient leans: N/A  Psychiatric Specialty Exam: Review of Systems  Psychiatric/Behavioral: The patient is nervous/anxious and has insomnia.   All other systems reviewed and are negative.   There were no vitals taken for this visit.There is no height or weight on file to calculate BMI.  General Appearance: Casual  Eye Contact:  Fair  Speech:  Clear and Coherent  Volume:  Normal  Mood:  Anxious and Depressed  Affect:  Congruent  Thought Process:  Goal Directed and Descriptions of Associations: Intact  Orientation:  Full (Time, Place, and Person)  Thought Content:  Logical  Suicidal Thoughts:  No  Homicidal Thoughts:  No  Memory:  Immediate;   Fair Recent;   Fair Remote;   Fair  Judgement:  Fair  Insight:  Fair  Psychomotor Activity:  Normal  Concentration:  Concentration: Fair and Attention Span: Fair  Recall:  [employer] of Knowledge:Fair  Language: Fair  Akathisia:  No  Handed:  Right  AIMS (if indicated):  Denies tremors,rigidity,stiffness  Assets:  Communication Skills Desire for Improvement Housing Physical Health Social Support Talents/Skills  ADL's:  Intact  Cognition: WNL  Sleep:   Fair   Screenings: PHQ2-9     Office Visit from 02/26/2016 in [facility] Office Visit from 01/11/2016 in [facility]  PHQ-2 Total Score  0  2  PHQ-9 Total Score  -  6      Assessment and Plan: Taggart is a 73 year old African-American male, divorced, on SSD, lives in [place], has a history of CVA, degenerative disc disease, chronic pain, visual disturbances, bipolar disorder, anxiety disorder, insomnia, tobacco use disorder was evaluated by telemedicine today.  Patient is biologically predisposed due to multiple medical problems including the recent stroke.  Patient also has psychosocial stressors of having to live with chronic pain, physical limitations due to stroke, financial stressors.  Patient also reports relationship struggles with his brother.  Patient will benefit from psychotherapy sessions as well as medication management.  Plan Bipolar disorder most recent episode depressed-unstable Start lamotrigine 25 mg p.o. daily for 2 weeks and increase to 50 mg. Refer for CBT.  For GAD-unstable Patient will benefit from an SSRI or SNRI. However will not add multiple medications today. Lamictal was started as noted above. Refer for CBT. Add hydroxyzine as needed.  For panic attacks-unstable Refer for CBT. Hydroxyzine 25 mg p.o. twice daily as needed Patient reports he is taking lorazepam prescribed by PMD for seizure disorder.  Discussed with him to follow-up with neurologist for the same.  If he is taking the lorazepam for anxiety attacks advised him to limit use and discuss the long-term risk of being on benzodiazepine therapy.  For insomnia-stable Trazodone as prescribed.  He reports he takes 150 mg p.o. nightly  For tobacco use disorder-unstable Provided smoking  cessation counseling.  He is working on cutting back.  Patient will benefit from the following labs-TSH.  Reviewed medical records  in [place] per Dr. Ginette Pitman most recent one dated 11/03/2018-' Patient with recent CVA with right medial occipital lobe infarct with partial left eye loss of peripheral vision.  Continue Plavix and statin.  For hypertension continue clonidine.  For anxiety and depression on trazodone and Ativan.  For vitamin B12 deficiency-on supplements.  For nephrolithiasis-doing better.  For positive hep C-status post [place] treatment.  Undetectable HCV RNA.'  Follow up in clinic in 2 to 3 weeks or sooner if needed.  I have spent atleast 60 minutes non face to face with patient today. More than 50 % of the time was spent for psychoeducation and supportive psychotherapy and care coordination.  This note was generated in part or whole with voice recognition software. Voice recognition is usually quite accurate but there are transcription errors that can and very often do occur. I apologize for any typographical errors that were not detected and corrected.               Ursula Alert, MD 4/28/20205:47 PM

## 2019-02-18 ENCOUNTER — Telehealth: Payer: Self-pay

## 2019-02-18 NOTE — Telephone Encounter (Signed)
went online at [email or web address] and pa was submited and was approved until  10-21-19

## 2019-02-18 NOTE — Telephone Encounter (Signed)
approval was faxed and confirmed to the pharmacy.

## 2019-02-18 NOTE — Telephone Encounter (Signed)
received a request for a prior auth on the hydroxyzine

## 2019-02-25 ENCOUNTER — Ambulatory Visit (INDEPENDENT_AMBULATORY_CARE_PROVIDER_SITE_OTHER): Payer: Medicare HMO | Admitting: Licensed Clinical Social Worker

## 2019-02-25 ENCOUNTER — Other Ambulatory Visit: Payer: Self-pay

## 2019-02-25 DIAGNOSIS — F3130 Bipolar disorder, current episode depressed, mild or moderate severity, unspecified: Secondary | ICD-10-CM

## 2019-02-25 NOTE — Progress Notes (Signed)
[facility] Initial Clinical Assessment  MRN: [id number] NAME: FLEETWOOD PIERRON Date: 02/25/19  Start time: 11a  End time: 12p Total time: 1 hour  Type of Contact:  telehealth Patient consent obtained:  yes Reason for Visit today:  establish care  Treatment History Patient recently received Inpatient Treatment:  denies  Facility/Program:    Date of discharge:   Patient currently being seen by therapist/psychiatrist:Dr. Eappen   Patient currently receiving the following services:    Past Psychiatric History/Hospitalization(s): Anxiety: No Bipolar Disorder: No Depression: No Mania: No Psychosis: No Schizophrenia: No Personality Disorder: No Hospitalization for psychiatric illness: No History of Electroconvulsive Shock Therapy: No Prior Suicide Attempts: No  Clinical Assessment:  PHQ-9 Assessments: Depression screen [facility] 2/9 02/26/2016 01/11/2016  Decreased Interest 0 1  Down, Depressed, Hopeless 0 1  PHQ - 2 Score 0 2  Altered sleeping - 1  Tired, decreased energy - 2  Change in appetite - 0  Feeling bad or failure about yourself  - 0  Trouble concentrating - 1  Moving slowly or fidgety/restless - 0  Suicidal thoughts - 0  PHQ-9 Score - 6  Difficult doing work/chores - Somewhat difficult    GAD-7 Assessments: No flowsheet data found.   Social Functioning Social maturity:   Social judgement:    Stress Current stressors:   Familial stressors:   Sleep:   Appetite:   Coping ability:   Patient taking medications as prescribed:    Current medications:  Outpatient Encounter Medications as of 02/25/2019  Medication Sig  . aspirin EC 81 MG tablet Take 1 tablet (81 mg total) by mouth daily.  Marland Kitchen atorvastatin (LIPITOR) 40 MG tablet Take 1 tablet (40 mg total) by mouth daily at 6 PM.  . cloNIDine (CATAPRES) 0.1 MG tablet Take 0.1 mg by mouth daily.   . clopidogrel (PLAVIX) 75 MG tablet Take 1 tablet (75 mg total) by mouth daily.  . hydrOXYzine (VISTARIL) 25  MG capsule Take 1 capsule (25 mg total) by mouth 2 (two) times daily as needed. For severe anxiety symptoms  . lamoTRIgine (LAMICTAL) 25 MG tablet Take 1 tablet (25 mg total) by mouth as directed. Take 1 tablet for 2 weeks and increase to 2 tablets  . LORazepam (ATIVAN) 0.5 MG tablet Take 0.5 mg by mouth daily as needed for anxiety. Reported on 02/26/2016  . tiZANidine (ZANAFLEX) 4 MG tablet Take 4 mg by mouth 2 (two) times daily.   . traMADol (ULTRAM) 50 MG tablet Take by mouth.  . traMADol-acetaminophen (ULTRACET) 37.5-325 MG tablet Take 1 tablet by mouth every 8 (eight) hours as needed for moderate pain.  . traZODone (DESYREL) 150 MG tablet Take 1 tablet (150 mg total) by mouth at bedtime.   Facility-Administered Encounter Medications as of 02/25/2019  Medication  . lidocaine (XYLOCAINE) 2 % jelly 1 application    Self-harm Behaviors Risk Assessment Self-harm risk factors:   Patient endorses recent thoughts of harming self:    [place] Suicide Severity Rating Scale: No flowsheet data found.  Danger to Others Risk Assessment Danger to others risk factors:   Patient endorses recent thoughts of harming others:    Dynamic Appraisal of Situational Aggression (DASA): No flowsheet data found.  Substance Use Assessment Patient recently consumed alcohol:    Alcohol Use Disorder Identification Test (AUDIT): No flowsheet data found. Patient recently used drugs:    Opioid Risk Assessment:  Patient is concerned about dependence or abuse of substances:    ASAM Multidimensional Assessment Summary:  Dimension 1:    Dimension 1 Rating:    Dimension 2:    Dimension 2 Rating:    Dimension 3:    Dimension 3 Rating:    Dimension 4:    Dimension 4 Rating:    Dimension 5:    Dimension 5 Rating:    Dimension 6:    Dimension 6 Rating:   ASAM's Severity Rating Score:   ASAM Recommended Level of Treatment:     Goals, Interventions and Follow-up Plan Goals: Increase healthy adjustment to current  life circumstances and Increase adequate support systems for patient/family Interventions: Motivational Interviewing Follow-up Plan: OPT  Summary of Clinical Assessment Summary: [place] is a 73 year old African-American male, divorced, currently lives in [place], has a history of chronic pain, partial loss of vision, history of hepatitis C, and hypertension.Patient reports that his seizures impacted his mental health.  Reports bipolar and anxiety. He reports he is always angry all the time.  He struggles with irritability and anger issues currently on a regular basis.  He reports sadness, lack of motivation, inability to concentrate as well as sleep issues.  Reports that he has a seizure about once every 2 weeks. 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  He reports there are times when he struggles with panic symptoms.  He feels jittery, shaky and feels extremely restless.  Reports that he lives with his daughter and his ex-wife.  Reports that he does not have peripheral vision but occasionally drives.  He states that his ex wife and daughter usually transport him.   Lubertha South, LCSW

## 2019-03-04 ENCOUNTER — Other Ambulatory Visit: Payer: Self-pay | Admitting: *Deleted

## 2019-03-09 DIAGNOSIS — R270 Ataxia, unspecified: Secondary | ICD-10-CM | POA: Insufficient documentation

## 2019-03-09 DIAGNOSIS — R569 Unspecified convulsions: Secondary | ICD-10-CM | POA: Insufficient documentation

## 2019-03-09 DIAGNOSIS — H53453 Other localized visual field defect, bilateral: Secondary | ICD-10-CM | POA: Insufficient documentation

## 2019-03-11 ENCOUNTER — Other Ambulatory Visit: Payer: Self-pay

## 2019-03-11 ENCOUNTER — Encounter: Payer: Self-pay | Admitting: Psychiatry

## 2019-03-11 ENCOUNTER — Ambulatory Visit (INDEPENDENT_AMBULATORY_CARE_PROVIDER_SITE_OTHER): Payer: Medicare HMO | Admitting: Psychiatry

## 2019-03-11 DIAGNOSIS — G4700 Insomnia, unspecified: Secondary | ICD-10-CM | POA: Diagnosis not present

## 2019-03-11 DIAGNOSIS — F410 Panic disorder [episodic paroxysmal anxiety] without agoraphobia: Secondary | ICD-10-CM | POA: Diagnosis not present

## 2019-03-11 DIAGNOSIS — F3130 Bipolar disorder, current episode depressed, mild or moderate severity, unspecified: Secondary | ICD-10-CM | POA: Diagnosis not present

## 2019-03-11 DIAGNOSIS — F411 Generalized anxiety disorder: Secondary | ICD-10-CM

## 2019-03-11 DIAGNOSIS — F17200 Nicotine dependence, unspecified, uncomplicated: Secondary | ICD-10-CM

## 2019-03-11 MED ORDER — RISPERIDONE 0.5 MG PO TABS: 0.5000 mg | ORAL_TABLET | Freq: Two times a day (BID) | ORAL | 1 refills | Status: DC

## 2019-03-11 MED ORDER — LAMOTRIGINE 25 MG PO TABS: 100.0000 mg | ORAL_TABLET | ORAL | 1 refills | Status: DC

## 2019-03-11 NOTE — Progress Notes (Signed)
Virtual Visit via Video Note  I connected with [name] on 03/11/19 at  2:15 PM EDT by a video enabled telemedicine application and verified that I am speaking with the correct person using two identifiers.   I discussed the limitations of evaluation and management by telemedicine and the availability of in person appointments. The patient expressed understanding and agreed to proceed.   I discussed the assessment and treatment plan with the patient. The patient was provided an opportunity to ask questions and all were answered. The patient agreed with the plan and demonstrated an understanding of the instructions.   The patient was advised to call back or seek an in-person evaluation if the symptoms worsen or if the condition fails to improve as anticipated.   Reform MD OP Progress Note  03/11/2019 3:17 PM [name]  MRN:  [id number]  Chief Complaint:  Chief Complaint    Follow-up     HPI: [name] is a 73 year old African-American male, divorced, currently lives in [place], has a history of chronic pain, right medial occipital lobe CVA, partial loss of vision, hepatitis C, hypertension, tobacco use disorder, bipolar disorder disorder, generalized anxiety disorder, panic attacks was evaluated by telemedicine today.  Patient today reports he continues to struggle with a lot of irritability and anger issues.  He reports he has been having arguments with his family.  He reports he is tolerating the Lamictal well.  He denies any side effects.  He is currently on 50 mg.  Patient reports sleep is good on the trazodone.  There are some nights when he is restless however overall his sleep has been okay.  Patient reports he has started psychotherapy sessions with Ms. Peacock which went well.  He does have an upcoming appointment coming up.  Patient denies any suicidality, homicidality or perceptual disturbances.  Discussed with patient about adding a mood stabilizer like risperidone.  Discussed with him the black box warning including increased morbidity and mortality in elderly patients especially with dementia.  Patient is agreeable to start low-dose of risperidone.  He has tried other medications like Abilify and Seroquel previously which did not work or gave him side effects.  Patient denies any other concerns today. Visit Diagnosis:    ICD-10-CM   1. Bipolar I disorder, most recent episode depressed (HCC) F31.30 lamoTRIgine (LAMICTAL) 25 MG tablet    risperiDONE (RISPERDAL) 0.5 MG tablet  2. Generalized anxiety disorder F41.1   3. Panic attacks F41.0   4. Insomnia, unspecified type G47.00   5. Tobacco use disorder F17.200     Past Psychiatric History: Reviewed past psychiatric history from my progress note on 02/16/2019.  Past trials of Seroquel, Ativan, Ambien, trazodone, Cymbalta by pain management-reports he did not take it.  Past Medical History:  Past Medical History:  Diagnosis Date  . Anxiety   . Arthritis    DDD, lumbar radiculopathy  . Bell's palsy    hx of, right side  . Bell's palsy   . Chronic back pain    follow[facility]  . Depression    hx of depression  . GERD (gastroesophageal reflux disease)   . Hepatitis    hx of hepatitis C  . History of kidney stones   . Hypertension   . Kidney stone    hx of  . Stroke ([place]) 02/2018   loss of peripheral vision and balance unsteady  . Substance abuse [facility])    heroin    Past Surgical History:  Procedure Laterality Date  . ANTERIOR LUMBAR FUSION  05/13/2012   Procedure: ANTERIOR LUMBAR FUSION 1 LEVEL;  Surgeon: Melina Schools, MD;  Location: [facility];  Service: Orthopedics;  Laterality: N/A;  ALIF L5-S1  . CHOLECYSTECTOMY    . CYSTOSCOPY W/ RETROGRADES Left 08/04/2018   Procedure: CYSTOSCOPY WITH RETROGRADE PYELOGRAM;  Surgeon: Abbie Sons, MD;  Location: [facility];  Service: Urology;  Laterality: Left;  . CYSTOSCOPY WITH STENT PLACEMENT Left 08/04/2018   Procedure: CYSTOSCOPY  WITH STENT PLACEMENT;  Surgeon: Abbie Sons, MD;  Location: [facility];  Service: Urology;  Laterality: Left;  . EXTRACORPOREAL SHOCK WAVE LITHOTRIPSY  2012  . EXTRACORPOREAL SHOCK WAVE LITHOTRIPSY Left 08/20/2018   Procedure: EXTRACORPOREAL SHOCK WAVE LITHOTRIPSY (ESWL);  Surgeon: Hollice Espy, MD;  Location: [facility];  Service: Urology;  Laterality: Left; (cancelled)  . EXTRACORPOREAL SHOCK WAVE LITHOTRIPSY Left 08/27/2018   Procedure: EXTRACORPOREAL SHOCK WAVE LITHOTRIPSY (ESWL);  Surgeon: Abbie Sons, MD;  Location: [facility];  Service: Urology;  Laterality: Left;  . EXTRACORPOREAL SHOCK WAVE LITHOTRIPSY Right 10/15/2018   Procedure: EXTRACORPOREAL SHOCK WAVE LITHOTRIPSY (ESWL);  Surgeon: Billey Co, MD;  Location: [facility];  Service: Urology;  Laterality: Right;  . KIDNEY STONE SURGERY    . LUMBAR FUSION  05/13/2012    Family Psychiatric History: Reviewed family psychiatric history from my progress note on 02/16/2019. Family History:  Family History  Problem Relation Age of Onset  . Stroke Mother   . Diabetes Mother   . Lung cancer Father   . Diabetes Father     Social History: Reviewed social history from my progress note on 02/16/2019. Social History   Socioeconomic History  . Marital status: Divorced    Spouse name: Not on file  . Number of children: 2  . Years of education: Not on file  . Highest education level: Associate degree: occupational, Hotel manager, or vocational program  Occupational History  . Not on file  Social Needs  . Financial resource strain: Not hard at all  . Food insecurity:    Worry: Never true    Inability: Never true  . Transportation needs:    Medical: Yes    Non-medical: Yes  Tobacco Use  . Smoking status: Current Every Day Smoker    Packs/day: 0.50    Years: 53.00    Pack years: 26.50    Types: Cigarettes  . Smokeless tobacco: Never Used  Substance and Sexual Activity  . Alcohol use: Not Currently    Alcohol/week: 0.0  standard drinks  . Drug use: Not Currently    Types: Heroin, Cocaine, Marijuana    Comment: 1960's  . Sexual activity: Yes  Lifestyle  . Physical activity:    Days per week: 0 days    Minutes per session: 0 min  . Stress: Not at all  Relationships  . Social connections:    Talks on phone: Not on file    Gets together: Not on file    Attends religious service: Never    Active member of club or organization: No    Attends meetings of clubs or organizations: Never    Relationship status: Divorced  Other Topics Concern  . Not on file  Social History Narrative   ** Merged History Encounter **        Allergies: No Known Allergies  Metabolic Disorder Labs: No results found for: HGBA1C, MPG No results found for: PROLACTIN Lab Results  Component Value Date   CHOL 200 03/13/2018  TRIG 247 (H) 03/13/2018   HDL 20 (L) 03/13/2018   CHOLHDL 10.0 03/13/2018   VLDL 49 (H) 03/13/2018   LDLCALC 131 (H) 03/13/2018   No results found for: TSH  Therapeutic Level Labs: No results found for: LITHIUM No results found for: VALPROATE No components found for:  CBMZ  Current Medications: Current Outpatient Medications  Medication Sig Dispense Refill  . aspirin EC 81 MG tablet Take 1 tablet (81 mg total) by mouth daily. 30 tablet 0  . atorvastatin (LIPITOR) 40 MG tablet Take 1 tablet (40 mg total) by mouth daily at 6 PM. 30 tablet 0  . cloNIDine (CATAPRES) 0.1 MG tablet Take 0.1 mg by mouth daily.     . clopidogrel (PLAVIX) 75 MG tablet Take 1 tablet (75 mg total) by mouth daily. 90 tablet 0  . hydrOXYzine (VISTARIL) 25 MG capsule Take 1 capsule (25 mg total) by mouth 2 (two) times daily as needed. For severe anxiety symptoms 60 capsule 1  . lamoTRIgine (LAMICTAL) 25 MG tablet Take 4 tablets (100 mg total) by mouth as directed. Take 3 tablets for 2 weeks and increase to 4 tablets 120 tablet 1  . risperiDONE (RISPERDAL) 0.5 MG tablet Take 1 tablet (0.5 mg total) by mouth 2 (two) times  daily. For mood lability 60 tablet 1  . tiZANidine (ZANAFLEX) 4 MG tablet Take 4 mg by mouth 2 (two) times daily.     . traMADol (ULTRAM) 50 MG tablet Take by mouth.    . traMADol-acetaminophen (ULTRACET) 37.5-325 MG tablet Take 1 tablet by mouth every 8 (eight) hours as needed for moderate pain. 15 tablet 0  . traZODone (DESYREL) 150 MG tablet Take 1 tablet (150 mg total) by mouth at bedtime. 90 tablet 0   Current Facility-Administered Medications  Medication Dose Route Frequency Provider Last Rate Last Dose  . lidocaine (XYLOCAINE) 2 % jelly 1 application  1 application Urethral Once Stoioff, Ronda Fairly, MD         Musculoskeletal: Strength & Muscle Tone: UTA Gait & Station: normal Patient leans: N/A  Psychiatric Specialty Exam: Review of Systems  Psychiatric/Behavioral: The patient is nervous/anxious.   All other systems reviewed and are negative.   There were no vitals taken for this visit.There is no height or weight on file to calculate BMI.  General Appearance: Casual  Eye Contact:  Fair  Speech:  Normal Rate  Volume:  Normal  Mood:  Anxious  Affect:  Congruent  Thought Process:  Goal Directed and Descriptions of Associations: Intact  Orientation:  Full (Time, Place, and Person)  Thought Content: Logical   Suicidal Thoughts:  No  Homicidal Thoughts:  No  Memory:  Immediate;   Fair Recent;   Fair Remote;   Fair  Judgement:  Fair  Insight:  Fair  Psychomotor Activity:  Normal  Concentration:  Concentration: Fair and Attention Span: Fair  Recall:  [employer] of Knowledge: Fair  Language: Fair  Akathisia:  No  Handed:  Right  AIMS (if indicated): Denies tremors, rigidity  Assets:  Communication Skills Desire for Improvement Housing Social Support  ADL's:  Intact  Cognition: WNL  Sleep:  Fair   Screenings: PHQ2-9     Office Visit from 02/26/2016 in [facility] Office Visit from 01/11/2016 in [facility]  PHQ-2 Total Score  0  2  PHQ-9 Total Score  -  6       Assessment and  Plan: [name] is a 73 year old African-American male, divorced, on SSD, lives in [place], has a history of CVA, degenerative disc disease, chronic pain, visual disturbances, bipolar disorder, anxiety disorder, insomnia, tobacco use disorder was evaluated by telemedicine today.  Patient is biologically predisposed due to multiple medical problems including his recent stroke.  Patient with psychosocial stressors of having to live with chronic pain, physical limitations due to stroke, financial stressors and relationship struggles.  Patient will continue to benefit from medications as well as psychotherapy sessions due to his mood lability.  Plan Bipolar disorder-unstable Increase Lamictal to 75 mg for 2 weeks and then to 100 mg p.o. daily. Add risperidone 0.5 mg p.o. twice daily Provided medication education Patient to continue CBT with Ms. Peacock.  For GAD-unstable I added risperidone which may help.  He will benefit from an SSRI or SNRI if he continues to be anxious will continue to reassess. Continue CBT Continue hydroxyzine 25 mg p.o. twice daily PRN.  Panic attacks- some progress Continue CBT Hydroxyzine 25 mg p.o. twice daily as needed   For insomnia-improving Trazodone as prescribed.  He is currently on 150 mg p.o. nightly  For tobacco use disorder- unstable Provided smoking cessation counseling.  TSH labs pending  Follow-up in clinic in 2 to 3 weeks or sooner if needed.  Appointment scheduled for June 11 at 4:15 pm.  I have spent atleast 25 minutes non  face to face with patient today. More than 50 % of the time was spent for psychoeducation and supportive psychotherapy and care coordination.  This note was generated in part or whole with voice recognition software. Voice recognition is usually quite accurate but there are transcription errors that can and very often do  occur. I apologize for any typographical errors that were not detected and corrected.         Ursula Alert, MD 03/11/2019, 3:17 PM

## 2019-03-15 ENCOUNTER — Other Ambulatory Visit: Payer: Self-pay | Admitting: Psychiatry

## 2019-03-15 DIAGNOSIS — F410 Panic disorder [episodic paroxysmal anxiety] without agoraphobia: Secondary | ICD-10-CM

## 2019-03-16 ENCOUNTER — Ambulatory Visit: Payer: Medicare HMO | Admitting: Licensed Clinical Social Worker

## 2019-03-16 ENCOUNTER — Other Ambulatory Visit: Payer: Self-pay

## 2019-03-16 DIAGNOSIS — F3130 Bipolar disorder, current episode depressed, mild or moderate severity, unspecified: Secondary | ICD-10-CM

## 2019-03-16 NOTE — Progress Notes (Signed)
Left message encouraging contact 

## 2019-04-03 ENCOUNTER — Other Ambulatory Visit: Payer: Self-pay | Admitting: Psychiatry

## 2019-04-03 DIAGNOSIS — F3130 Bipolar disorder, current episode depressed, mild or moderate severity, unspecified: Secondary | ICD-10-CM

## 2019-04-06 MED ORDER — LAMOTRIGINE 100 MG PO TABS: 100.0000 mg | ORAL_TABLET | Freq: Every day | ORAL | 0 refills | Status: DC

## 2019-04-06 NOTE — Telephone Encounter (Signed)
Sent Lamictal to pharmacy 

## 2019-04-14 ENCOUNTER — Other Ambulatory Visit: Payer: Self-pay | Admitting: Psychiatry

## 2019-04-14 DIAGNOSIS — F410 Panic disorder [episodic paroxysmal anxiety] without agoraphobia: Secondary | ICD-10-CM

## 2019-05-14 ENCOUNTER — Other Ambulatory Visit: Payer: Self-pay | Admitting: Psychiatry

## 2019-05-14 DIAGNOSIS — F3130 Bipolar disorder, current episode depressed, mild or moderate severity, unspecified: Secondary | ICD-10-CM

## 2019-05-17 ENCOUNTER — Other Ambulatory Visit: Payer: Self-pay | Admitting: Psychiatry

## 2019-05-17 DIAGNOSIS — F3130 Bipolar disorder, current episode depressed, mild or moderate severity, unspecified: Secondary | ICD-10-CM

## 2019-06-16 ENCOUNTER — Other Ambulatory Visit: Payer: Self-pay | Admitting: Psychiatry

## 2019-06-16 DIAGNOSIS — F3130 Bipolar disorder, current episode depressed, mild or moderate severity, unspecified: Secondary | ICD-10-CM

## 2019-11-01 DIAGNOSIS — R0602 Shortness of breath: Secondary | ICD-10-CM | POA: Diagnosis not present

## 2019-11-01 DIAGNOSIS — I10 Essential (primary) hypertension: Secondary | ICD-10-CM | POA: Diagnosis not present

## 2019-11-01 DIAGNOSIS — Z8673 Personal history of transient ischemic attack (TIA), and cerebral infarction without residual deficits: Secondary | ICD-10-CM | POA: Diagnosis not present

## 2019-12-06 DIAGNOSIS — Z720 Tobacco use: Secondary | ICD-10-CM | POA: Diagnosis not present

## 2019-12-06 DIAGNOSIS — F5102 Adjustment insomnia: Secondary | ICD-10-CM | POA: Diagnosis not present

## 2019-12-06 DIAGNOSIS — Z8673 Personal history of transient ischemic attack (TIA), and cerebral infarction without residual deficits: Secondary | ICD-10-CM | POA: Diagnosis not present

## 2019-12-06 DIAGNOSIS — R270 Ataxia, unspecified: Secondary | ICD-10-CM | POA: Diagnosis not present

## 2019-12-06 DIAGNOSIS — Z0283 Encounter for blood-alcohol and blood-drug test: Secondary | ICD-10-CM | POA: Diagnosis not present

## 2019-12-06 DIAGNOSIS — I10 Essential (primary) hypertension: Secondary | ICD-10-CM | POA: Diagnosis not present

## 2019-12-06 DIAGNOSIS — Z0000 Encounter for general adult medical examination without abnormal findings: Secondary | ICD-10-CM | POA: Diagnosis not present

## 2019-12-06 DIAGNOSIS — Z0289 Encounter for other administrative examinations: Secondary | ICD-10-CM | POA: Diagnosis not present

## 2019-12-06 DIAGNOSIS — H539 Unspecified visual disturbance: Secondary | ICD-10-CM | POA: Diagnosis not present

## 2019-12-06 DIAGNOSIS — M5136 Other intervertebral disc degeneration, lumbar region: Secondary | ICD-10-CM | POA: Diagnosis not present

## 2019-12-06 DIAGNOSIS — F329 Major depressive disorder, single episode, unspecified: Secondary | ICD-10-CM | POA: Diagnosis not present

## 2019-12-06 DIAGNOSIS — M545 Low back pain: Secondary | ICD-10-CM | POA: Diagnosis not present

## 2019-12-13 DIAGNOSIS — F5102 Adjustment insomnia: Secondary | ICD-10-CM | POA: Diagnosis not present

## 2019-12-13 DIAGNOSIS — F0390 Unspecified dementia without behavioral disturbance: Secondary | ICD-10-CM | POA: Diagnosis not present

## 2019-12-13 DIAGNOSIS — Z8673 Personal history of transient ischemic attack (TIA), and cerebral infarction without residual deficits: Secondary | ICD-10-CM | POA: Diagnosis not present

## 2019-12-13 DIAGNOSIS — I10 Essential (primary) hypertension: Secondary | ICD-10-CM | POA: Diagnosis not present

## 2019-12-13 DIAGNOSIS — Z0000 Encounter for general adult medical examination without abnormal findings: Secondary | ICD-10-CM | POA: Diagnosis not present

## 2019-12-13 DIAGNOSIS — E538 Deficiency of other specified B group vitamins: Secondary | ICD-10-CM | POA: Diagnosis not present

## 2019-12-13 DIAGNOSIS — R7309 Other abnormal glucose: Secondary | ICD-10-CM | POA: Diagnosis not present

## 2019-12-13 DIAGNOSIS — M5136 Other intervertebral disc degeneration, lumbar region: Secondary | ICD-10-CM | POA: Diagnosis not present

## 2019-12-13 DIAGNOSIS — F333 Major depressive disorder, recurrent, severe with psychotic symptoms: Secondary | ICD-10-CM | POA: Diagnosis not present

## 2019-12-23 ENCOUNTER — Emergency Department
Admission: EM | Admit: 2019-12-23 | Discharge: 2019-12-23 | Disposition: A | Discharge status: Home / Self Care | Payer: Medicare HMO | Attending: Emergency Medicine | Admitting: Emergency Medicine

## 2019-12-23 ENCOUNTER — Emergency Department: Payer: Medicare HMO

## 2019-12-23 ENCOUNTER — Other Ambulatory Visit: Payer: Self-pay

## 2019-12-23 DIAGNOSIS — R1030 Lower abdominal pain, unspecified: Secondary | ICD-10-CM | POA: Insufficient documentation

## 2019-12-23 DIAGNOSIS — R339 Retention of urine, unspecified: Secondary | ICD-10-CM | POA: Diagnosis not present

## 2019-12-23 DIAGNOSIS — R350 Frequency of micturition: Secondary | ICD-10-CM | POA: Diagnosis not present

## 2019-12-23 DIAGNOSIS — M545 Low back pain: Secondary | ICD-10-CM | POA: Insufficient documentation

## 2019-12-23 DIAGNOSIS — I10 Essential (primary) hypertension: Secondary | ICD-10-CM | POA: Diagnosis not present

## 2019-12-23 DIAGNOSIS — Z7982 Long term (current) use of aspirin: Secondary | ICD-10-CM | POA: Insufficient documentation

## 2019-12-23 DIAGNOSIS — N200 Calculus of kidney: Secondary | ICD-10-CM | POA: Diagnosis not present

## 2019-12-23 DIAGNOSIS — R300 Dysuria: Secondary | ICD-10-CM | POA: Insufficient documentation

## 2019-12-23 DIAGNOSIS — Z7902 Long term (current) use of antithrombotics/antiplatelets: Secondary | ICD-10-CM | POA: Insufficient documentation

## 2019-12-23 DIAGNOSIS — F17210 Nicotine dependence, cigarettes, uncomplicated: Secondary | ICD-10-CM | POA: Diagnosis not present

## 2019-12-23 LAB — URINALYSIS, ROUTINE W REFLEX MICROSCOPIC
Bilirubin Urine: NEGATIVE
Glucose, UA: NEGATIVE mg/dL
Hgb urine dipstick: NEGATIVE
Ketones, ur: NEGATIVE mg/dL
Leukocytes,Ua: NEGATIVE
Nitrite: NEGATIVE
Protein, ur: NEGATIVE mg/dL
Specific Gravity, Urine: 1.008 (ref 1.005–1.030)
pH: 6 (ref 5.0–8.0)

## 2019-12-23 MED ORDER — TAMSULOSIN HCL 0.4 MG PO CAPS: 0.4000 mg | ORAL_CAPSULE | Freq: Every day | ORAL | 0 refills | Status: DC

## 2019-12-23 NOTE — ED Notes (Signed)
Pt back from ct

## 2019-12-23 NOTE — ED Notes (Signed)
Foley cath removed  Pt tolerated well   approx 300 cc urine in foley bag.

## 2019-12-23 NOTE — ED Provider Notes (Signed)
Emergency Department Provider Note  ____________________________________________  Time seen: Approximately 7:50 PM  I have reviewed the triage vital signs and the nursing notes.   HISTORY  Chief Complaint Urinary Retention   Historian Patient     HPI [name] is a 74 y.o. male presents to the emergency department with urinary retention for 1 week.  Patient states that he has diminished stream.  He endorses mild dysuria and low back pain.  He denies hematuria, penile discharge, scrotal swelling or erythema.  He denies a history of similar symptoms in the past.  He has suprapubic pain.  No other alleviating measures have been attempted.   Past Medical History:  Diagnosis Date  . Anxiety   . Arthritis    DDD, lumbar radiculopathy  . Bell's palsy    hx of, right side  . Bell's palsy   . Chronic back pain    followed by pain clinic at [facility]  . Depression    hx of depression  . GERD (gastroesophageal reflux disease)   . Hepatitis    hx of hepatitis C  . History of kidney stones   . Hypertension   . Kidney stone    hx of  . Stroke ([place]) 02/2018   loss of peripheral vision and balance unsteady  . Substance abuse ([place])    heroin     Immunizations up to date:  Yes.     Past Medical History:  Diagnosis Date  . Anxiety   . Arthritis    DDD, lumbar radiculopathy  . Bell's palsy    hx of, right side  . Bell's palsy   . Chronic back pain    followed by pain clinic at [facility]  . Depression    hx of depression  . GERD (gastroesophageal reflux disease)   . Hepatitis    hx of hepatitis C  . History of kidney stones   . Hypertension   . Kidney stone    hx of  . Stroke ([place]) 02/2018   loss of peripheral vision and balance unsteady  . Substance abuse [facility])    heroin    Patient Active Problem List   Diagnosis Date Noted  . Ataxia 03/09/2019  . Loss of peripheral visual field, bilateral 03/09/2019  . Seizure-like activity ([place]) 03/09/2019  .  Hyperlipidemia 11/25/2018  . Bilateral carotid artery stenosis 11/25/2018  . Failed back syndrome, lumbar 07/23/2018  . Nephrolithiasis 07/08/2018  . History of CVA (cerebrovascular accident) 05/22/2018  . Visual disturbance 05/22/2018  . CVA (cerebral vascular accident) ([place]) 03/12/2018  . PAD (peripheral artery disease) ([place]) 04/20/2017  . Chronic hepatitis C without hepatic coma ([place]) 03/12/2017  . Pain and swelling of lower extremity 01/14/2016  . Chronic hip pain (Right) 01/14/2016  . Osteoarthritis of hip (Right) 01/14/2016  . Generalized anxiety disorder 01/14/2016  . Depression with anxiety 01/14/2016  . Lumbar facet syndrome 01/14/2016  . Clinical depression 01/11/2016  . BP (high blood pressure) 01/11/2016  . Insomnia, psychophysiological 01/11/2016  . Current tobacco use 01/11/2016  . Chronic low back pain (Location of Primary Source of Pain) ([place]) 01/11/2016  . Chronic pain 01/11/2016  . Lumbar spondylosis 01/11/2016  . Long term current use of opiate analgesic 01/11/2016  . Long term prescription opiate use 01/11/2016  . Opiate use (15 MME/Day) 01/11/2016  . Encounter for therapeutic drug level monitoring 01/11/2016  . Encounter for pain management planning 01/11/2016  . Failed back surgical syndrome (S/P anterior fusion at L5-S1) 01/11/2016  .  Lumbar facet arthropathy 01/11/2016  . Lumbosacral radiculitis 08/16/2014  . DDD (degenerative disc disease), lumbar 08/16/2014  . Discogenic low back pain 04/27/2012    Past Surgical History:  Procedure Laterality Date  . ANTERIOR LUMBAR FUSION  05/13/2012   Procedure: ANTERIOR LUMBAR FUSION 1 LEVEL;  Surgeon: Melina Schools, MD;  Location: [place];  Service: Orthopedics;  Laterality: N/A;  ALIF L5-S1  . CHOLECYSTECTOMY    . CYSTOSCOPY W/ RETROGRADES Left 08/04/2018   Procedure: CYSTOSCOPY WITH RETROGRADE PYELOGRAM;  Surgeon: Abbie Sons, MD;  Location: [facility];  Service: Urology;  Laterality: Left;  . CYSTOSCOPY  WITH STENT PLACEMENT Left 08/04/2018   Procedure: CYSTOSCOPY WITH STENT PLACEMENT;  Surgeon: Abbie Sons, MD;  Location: [facility];  Service: Urology;  Laterality: Left;  . EXTRACORPOREAL SHOCK WAVE LITHOTRIPSY  2012  . EXTRACORPOREAL SHOCK WAVE LITHOTRIPSY Left 08/20/2018   Procedure: EXTRACORPOREAL SHOCK WAVE LITHOTRIPSY (ESWL);  Surgeon: Hollice Espy, MD;  Location: [facility];  Service: Urology;  Laterality: Left; (cancelled)  . EXTRACORPOREAL SHOCK WAVE LITHOTRIPSY Left 08/27/2018   Procedure: EXTRACORPOREAL SHOCK WAVE LITHOTRIPSY (ESWL);  Surgeon: Abbie Sons, MD;  Location: [facility];  Service: Urology;  Laterality: Left;  . EXTRACORPOREAL SHOCK WAVE LITHOTRIPSY Right 10/15/2018   Procedure: EXTRACORPOREAL SHOCK WAVE LITHOTRIPSY (ESWL);  Surgeon: Billey Co, MD;  Location: [facility];  Service: Urology;  Laterality: Right;  . KIDNEY STONE SURGERY    . LUMBAR FUSION  05/13/2012    Prior to Admission medications   Medication Sig Start Date End Date Taking? Authorizing Provider  aspirin EC 81 MG tablet Take 1 tablet (81 mg total) by mouth daily. 03/13/18   Hillary Bow, MD  atorvastatin (LIPITOR) 40 MG tablet Take 1 tablet (40 mg total) by mouth daily at 6 PM. 03/13/18   Hillary Bow, MD  cloNIDine (CATAPRES) 0.1 MG tablet Take 0.1 mg by mouth daily.  12/30/16   [provider]  clopidogrel (PLAVIX) 75 MG tablet Take 1 tablet (75 mg total) by mouth daily. 03/13/18   Hillary Bow, MD  hydrOXYzine (VISTARIL) 25 MG capsule TAKE 1 CAPSULE (25 MG TOTAL) BY MOUTH 2 (TWO) TIMES DAILY AS NEEDED FOR SEVERE ANXIETY SYMPTOMS 04/15/19   Ursula Alert, MD  lamoTRIgine (LAMICTAL) 100 MG tablet Take 1 tablet (100 mg total) by mouth daily. 04/06/19   Ursula Alert, MD  risperiDONE (RISPERDAL) 0.5 MG tablet TAKE 1 TABLET (0.5 MG TOTAL) BY MOUTH 2 (TWO) TIMES DAILY. FOR MOOD LABILITY 04/05/19   Ursula Alert, MD  tamsulosin (FLOMAX) 0.4 MG CAPS capsule Take 1 capsule (0.4 mg total)  by mouth daily. 12/23/19   Lannie Fields, PA-C  tiZANidine (ZANAFLEX) 4 MG tablet Take 4 mg by mouth 2 (two) times daily.  02/16/16   [provider]  traMADol Veatrice Bourbon) 50 MG tablet Take by mouth. 12/14/18   [provider]  traMADol-acetaminophen (ULTRACET) 37.5-325 MG tablet Take 1 tablet by mouth every 8 (eight) hours as needed for moderate pain. 10/06/18   Stoioff, Ronda Fairly, MD  traZODone (DESYREL) 150 MG tablet Take 1 tablet (150 mg total) by mouth at bedtime. 02/16/19   Ursula Alert, MD    Allergies Patient has no known allergies.  Family History  Problem Relation Age of Onset  . Stroke Mother   . Diabetes Mother   . Lung cancer Father   . Diabetes Father     Social History Social History   Tobacco Use  . Smoking status: Current Every Day Smoker  Packs/day: 0.50    Years: 53.00    Pack years: 26.50    Types: Cigarettes  . Smokeless tobacco: Never Used  Substance Use Topics  . Alcohol use: Never    Alcohol/week: 0.0 standard drinks  . Drug use: Not Currently    Types: Heroin, Cocaine, Marijuana    Comment: 1960's     Review of Systems  Constitutional: No fever/chills Eyes:  No discharge ENT: No upper respiratory complaints. Respiratory: no cough. No SOB/ use of accessory muscles to breath Gastrointestinal:   No nausea, no vomiting.  No diarrhea.  No constipation. Genitourinary: Patient has urinary retention.  Musculoskeletal: Negative for musculoskeletal pain. Skin: Negative for rash, abrasions, lacerations, ecchymosis.   ____________________________________________   PHYSICAL EXAM:  VITAL SIGNS: ED Triage Vitals  Enc Vitals Group     BP 12/23/19 1840 (!) 174/82     Pulse Rate 12/23/19 1840 83     Resp 12/23/19 1840 18     Temp 12/23/19 1840 99 F (37.2 C)     Temp Source 12/23/19 1840 Oral     SpO2 12/23/19 1840 98 %     Weight 12/23/19 1843 147 lb (66.7 kg)     Height 12/23/19 1843 5\' 2"  (1.575 m)     Head Circumference --       Peak Flow --      Pain Score 12/23/19 1843 5     Pain Loc --      Pain Edu? --      Excl. in [place]? --      Constitutional: Alert and oriented. Well appearing and in no acute distress. Eyes: Conjunctivae are normal. PERRL. EOMI. Head: Atraumatic. Cardiovascular: Normal rate, regular rhythm. Normal S1 and S2.  Good peripheral circulation. Respiratory: Normal respiratory effort without tachypnea or retractions. Lungs CTAB. Good air entry to the bases with no decreased or absent breath sounds Gastrointestinal: Bowel sounds x 4 quadrants.  Patient has suprapubic tenderness to palpation. No guarding or rigidity. No distention. Musculoskeletal: Full range of motion to all extremities. No obvious deformities noted Neurologic:  Normal for age. No gross focal neurologic deficits are appreciated.  Skin:  Skin is warm, dry and intact. No rash noted. Psychiatric: Mood and affect are normal for age. Speech and behavior are normal.   ____________________________________________   LABS (all labs ordered are listed, but only abnormal results are displayed)  Labs Reviewed  URINALYSIS, ROUTINE W REFLEX MICROSCOPIC - Abnormal; Notable for the following components:      Result Value   Color, Urine YELLOW (*)    APPearance CLEAR (*)    All other components within normal limits  URINE CULTURE   ____________________________________________  EKG   ____________________________________________  RADIOLOGY Unk Pinto, personally viewed and evaluated these images (plain radiographs) as part of my medical decision making, as well as reviewing the written report by the radiologist.    CT Renal Stone Study  Result Date: 12/23/2019 CLINICAL DATA:  Flank pain. EXAM: CT ABDOMEN AND PELVIS WITHOUT CONTRAST TECHNIQUE: Multidetector CT imaging of the abdomen and pelvis was performed following the standard protocol without IV contrast. COMPARISON:  CT 06/24/2018 FINDINGS: Lower chest: Faint interstitial  accentuation of the right greater than left lung base, present on prior exam, possibly interstitial lung disease. No confluent airspace disease. Hepatobiliary: Few scattered subcentimeter hepatic hypodensities are nonspecific, not well characterized on noncontrast exam. Postcholecystectomy. No biliary dilatation. Pancreas: No ductal dilatation or inflammation. Spleen: Normal in size. Adrenals/Urinary Tract: No adrenal nodule.  Multiple, at least 4, nonobstructing stones in the left kidney, largest in the lower pole measuring 7 mm. No ureteral calculi or hydronephrosis. Scattered low-density lesions in the left kidney incompletely characterized without contrast but likely cysts. No right hydronephrosis or renal calculi. Urinary bladder is decompressed by Foley catheter and not well assessed. Stomach/Bowel: Small hiatal hernia with minimal debris in the distal esophagus. Stomach is nondistended. Normal positioning of the ligament of Treitz. Slight fecalization of distal small bowel contents. No bowel obstruction or inflammation. Normal appendix tentatively visualized. Moderate colonic stool burden in the proximal colon. No colonic wall thickening or inflammation. Vascular/Lymphatic: Aorto bi-iliac atherosclerosis. No aortic aneurysm. No abdominopelvic adenopathy. Reproductive: Prostate is unremarkable. Other: Small fat containing umbilical hernia. No ascites or free air. Musculoskeletal: Postsurgical change at L5-S1. There are no acute or suspicious osseous abnormalities. Chronic avascular necrosis of the left femoral head without collapse. IMPRESSION: 1. Nonobstructing stones in the left kidney. No hydronephrosis or obstructive uropathy. 2. Small hiatal hernia with minimal debris in the distal esophagus, can be seen with reflux or slow transit. 3.  Aortic Atherosclerosis (ICD10-I70.0). Electronically Signed   By: Keith Rake M.D.   On: 12/23/2019 20:26     ____________________________________________    PROCEDURES  Procedure(s) performed:     Procedures     Medications - No data to display   ____________________________________________   INITIAL IMPRESSION / ASSESSMENT AND PLAN / ED COURSE  Pertinent labs & imaging results that were available during my care of the patient were reviewed by me and considered in my medical decision making (see chart for details).      Assessment and Plan:  Increased urinary frequency 74 year old male presents to the emergency department with increased urinary frequency.  Endorse suprapubic pain and low back pain.  Patient was hypertensive at triage but vital signs were otherwise reassuring.  On physical exam, patient had suprapubic tenderness to palpation but no CVA tenderness.  Differential diagnosis included BPH, cystitis, urethritis, prostatitis...  Urinalysis was noncontributory for cystitis.  Urine culture is pending.  CT renal stone study revealed no evidence of hydronephrosis or nephrolithiasis.  Patient was discharged with Flomax and advised to follow-up with urology.  Return precautions were given to return with new or worsening symptoms.  All patient questions were answered.     ____________________________________________  FINAL CLINICAL IMPRESSION(S) / ED DIAGNOSES  Final diagnoses:  Increased urinary frequency      NEW MEDICATIONS STARTED DURING THIS VISIT:  ED Discharge Orders         Ordered    tamsulosin (FLOMAX) 0.4 MG CAPS capsule  Daily     12/23/19 2035              This chart was dictated using voice recognition software/Dragon. Despite best efforts to proofread, errors can occur which can change the meaning. Any change was purely unintentional.     Karren Cobble 12/23/19 2149    Harvest Dark, MD 12/23/19 2250

## 2019-12-23 NOTE — ED Triage Notes (Signed)
Pt to tht er for urinary retention x 1 week. Pt is able to urinate but feels the pressure to urinate. Bladder scan in triage 42ml

## 2019-12-24 LAB — URINE CULTURE
Culture: <10000 — AB
Special Requests: NORMAL

## 2019-12-29 ENCOUNTER — Encounter: Payer: Self-pay | Admitting: Urology

## 2019-12-29 ENCOUNTER — Other Ambulatory Visit: Payer: Self-pay

## 2019-12-29 ENCOUNTER — Ambulatory Visit: Payer: Medicare HMO | Admitting: Urology

## 2019-12-29 DIAGNOSIS — N401 Enlarged prostate with lower urinary tract symptoms: Secondary | ICD-10-CM | POA: Diagnosis not present

## 2019-12-29 DIAGNOSIS — N138 Other obstructive and reflux uropathy: Secondary | ICD-10-CM

## 2019-12-29 DIAGNOSIS — N400 Enlarged prostate without lower urinary tract symptoms: Secondary | ICD-10-CM | POA: Diagnosis not present

## 2019-12-29 LAB — BLADDER SCAN AMB NON-IMAGING: Scan Result: 129

## 2019-12-29 NOTE — Progress Notes (Signed)
12/29/2019 8:03 AM   [name] December 19, 1945 [id number]  Referring provider: Tracie Harrier, [facility] [place],  [place] [address]  Chief Complaint  Patient presents with  . Follow-up    HPI: 74 y.o. male presents for lower urinary tract symptoms  -Initially seen 06/09/2018 for microhematuria -CTU bilateral nephrolithiasis 8 mm right lower pole stone, 5 mm right UPJ and 8 mm left renal pelvic calculus -Prostate with moderate lateral lobe enlargement/bladder neck elevation -Ureteroscopy attempted 08/04/2018 however unable to access upper tract secondary to anatomy; stent placed -Elective shockwave lithotripsy performed 08/12/2018; residual lower pole calculi -Declined retreatment -Stent removed 09/02/2018 -Right ESWL 09/2018  -Seen in ED 12/23/2019 complaining urinary hesitancy and slow stream -Urinalysis unremarkable and urine culture with insignificant growth -Started on tamsulosin with mild improvement in symptoms   PMH: Past Medical History:  Diagnosis Date  . Anxiety   . Arthritis    DDD, lumbar radiculopathy  . Bell's palsy    hx of, right side  . Bell's palsy   . Chronic back pain    followed by pain clinic at [facility]  . Depression    hx of depression  . GERD (gastroesophageal reflux disease)   . Hepatitis    hx of hepatitis C  . History of kidney stones   . Hypertension   . Kidney stone    hx of  . Stroke ([place]) 02/2018   loss of peripheral vision and balance unsteady  . Substance abuse [facility])    heroin    Surgical History: Past Surgical History:  Procedure Laterality Date  . ANTERIOR LUMBAR FUSION  05/13/2012   Procedure: ANTERIOR LUMBAR FUSION 1 LEVEL;  Surgeon: Melina Schools, MD;  Location: [place];  Service: Orthopedics;  Laterality: N/A;  ALIF L5-S1  . CHOLECYSTECTOMY    . CYSTOSCOPY W/ RETROGRADES Left 08/04/2018   Procedure: CYSTOSCOPY WITH RETROGRADE PYELOGRAM;  Surgeon: Abbie Sons, MD;  Location: [facility];  Service: Urology;  Laterality: Left;  . CYSTOSCOPY WITH STENT PLACEMENT Left 08/04/2018   Procedure: CYSTOSCOPY WITH STENT PLACEMENT;  Surgeon: Abbie Sons, MD;  Location: [facility];  Service: Urology;  Laterality: Left;  . EXTRACORPOREAL SHOCK WAVE LITHOTRIPSY  2012  . EXTRACORPOREAL SHOCK WAVE LITHOTRIPSY Left 08/20/2018   Procedure: EXTRACORPOREAL SHOCK WAVE LITHOTRIPSY (ESWL);  Surgeon: Hollice Espy, MD;  Location: [facility];  Service: Urology;  Laterality: Left; (cancelled)  . EXTRACORPOREAL SHOCK WAVE LITHOTRIPSY Left 08/27/2018   Procedure: EXTRACORPOREAL SHOCK WAVE LITHOTRIPSY (ESWL);  Surgeon: Abbie Sons, MD;  Location: [facility];  Service: Urology;  Laterality: Left;  . EXTRACORPOREAL SHOCK WAVE LITHOTRIPSY Right 10/15/2018   Procedure: EXTRACORPOREAL SHOCK WAVE LITHOTRIPSY (ESWL);  Surgeon: Billey Co, MD;  Location: [facility];  Service: Urology;  Laterality: Right;  . KIDNEY STONE SURGERY    . LUMBAR FUSION  05/13/2012    Home Medications:  Allergies as of 12/29/2019   No Known Allergies     Medication List       Accurate as of December 29, 2019 11:59 PM. If you have any questions, ask your nurse or doctor.        aspirin EC 81 MG tablet Take 1 tablet (81 mg total) by mouth daily.   atorvastatin 40 MG tablet Commonly known as: LIPITOR Take 1 tablet (40 mg total) by mouth daily at 6 PM.   cloNIDine 0.1 MG tablet Commonly known as: CATAPRES Take 0.1 mg by mouth daily.   clopidogrel  75 MG tablet Commonly known as: PLAVIX Take 1 tablet (75 mg total) by mouth daily.   hydrOXYzine 25 MG capsule Commonly known as: VISTARIL TAKE 1 CAPSULE (25 MG TOTAL) BY MOUTH 2 (TWO) TIMES DAILY AS NEEDED FOR SEVERE ANXIETY SYMPTOMS   lamoTRIgine 100 MG tablet Commonly known as: LaMICtal Take 1 tablet (100 mg total) by mouth daily.   risperiDONE 0.5 MG tablet Commonly known as: RISPERDAL TAKE 1 TABLET (0.5 MG TOTAL) BY MOUTH 2 (TWO) TIMES DAILY. FOR MOOD  LABILITY   tamsulosin 0.4 MG Caps capsule Commonly known as: Flomax Take 1 capsule (0.4 mg total) by mouth daily.   tiZANidine 4 MG tablet Commonly known as: ZANAFLEX Take 4 mg by mouth 2 (two) times daily.   traMADol 50 MG tablet Commonly known as: ULTRAM Take by mouth.   traMADol-acetaminophen 37.5-325 MG tablet Commonly known as: ULTRACET Take 1 tablet by mouth every 8 (eight) hours as needed for moderate pain.   traZODone 150 MG tablet Commonly known as: DESYREL Take 1 tablet (150 mg total) by mouth at bedtime.       Allergies: No Known Allergies  Family History: Family History  Problem Relation Age of Onset  . Stroke Mother   . Diabetes Mother   . Lung cancer Father   . Diabetes Father     Social History:  reports that he has been smoking cigarettes. He has a 26.50 pack-year smoking history. He has never used smokeless tobacco. He reports previous drug use. Drugs: Heroin, Cocaine, and Marijuana. He reports that he does not drink alcohol.   Physical Exam: BP 140/83   Pulse (!) 114   Ht 5\' 2"  (1.575 m)   Wt 145 lb (65.8 kg)   BMI 26.52 kg/m   Constitutional:  Alert and oriented, No acute distress. HEENT: [place] AT, moist mucus membranes.  Trachea midline, no masses. Cardiovascular: No clubbing, cyanosis, or edema.  RRR Respiratory: Normal respiratory effort, no increased work of breathing.  Clear GI: Abdomen is soft, nontender, nondistended, no abdominal masses Lymph: No cervical or inguinal lymphadenopathy. Skin: No rashes, bruises or suspicious lesions. Neurologic: Grossly intact, no focal deficits, moving all 4 extremities. Psychiatric: Normal mood and affect.   Assessment & Plan:    - BPH with lower urinary tract symptoms He could not give a urine specimen in the office and estimated bladder volume by bladder scan was 129 mL.  He has been on tamsulosin approximately 10 days.  I recommended continuing for at least 30 days with a follow-up visit to  reassess symptoms.  If symptoms had not improved we discussed surgical options including TURP/PVP and minimally invasive options including UroLift.  Today he indicated he does not desire to take medication for his voiding symptoms and was interested in proceeding with UroLift. Estimated prostate volume by CT was 38cc.  We discussed the procedure in detail with the most common postoperative side effects of frequency, urgency, dysuria and hematuria.  We will need clearance to stop his Plavix.  The low incidence of sexual side effects were discussed.  We also reviewed that there is no guarantee this will resolve or improve his symptoms.  He indicated all questions were answered and desires to proceed   Abbie Sons, MD  [place] [address], [place],  [address] [phone]

## 2019-12-29 NOTE — H&P (View-Only) (Signed)
12/29/2019 8:03 AM   [name] 26-Nov-1945 [id number]  Referring provider: Tracie Harrier, [facility] [place],  [place] [address]  Chief Complaint  Patient presents with  . Follow-up    HPI: 74 y.o. male presents for lower urinary tract symptoms  -Initially seen 06/09/2018 for microhematuria -CTU bilateral nephrolithiasis 8 mm right lower pole stone, 5 mm right UPJ and 8 mm left renal pelvic calculus -Prostate with moderate lateral lobe enlargement/bladder neck elevation -Ureteroscopy attempted 08/04/2018 however unable to access upper tract secondary to anatomy; stent placed -Elective shockwave lithotripsy performed 08/12/2018; residual lower pole calculi -Declined retreatment -Stent removed 09/02/2018 -Right ESWL 09/2018  -Seen in ED 12/23/2019 complaining urinary hesitancy and slow stream -Urinalysis unremarkable and urine culture with insignificant growth -Started on tamsulosin with mild improvement in symptoms   PMH: Past Medical History:  Diagnosis Date  . Anxiety   . Arthritis    DDD, lumbar radiculopathy  . Bell's palsy    hx of, right side  . Bell's palsy   . Chronic back pain    followed by pain clinic at [facility]  . Depression    hx of depression  . GERD (gastroesophageal reflux disease)   . Hepatitis    hx of hepatitis C  . History of kidney stones   . Hypertension   . Kidney stone    hx of  . Stroke ([place]) 02/2018   loss of peripheral vision and balance unsteady  . Substance abuse [facility])    heroin    Surgical History: Past Surgical History:  Procedure Laterality Date  . ANTERIOR LUMBAR FUSION  05/13/2012   Procedure: ANTERIOR LUMBAR FUSION 1 LEVEL;  Surgeon: Melina Schools, MD;  Location: [place];  Service: Orthopedics;  Laterality: N/A;  ALIF L5-S1  . CHOLECYSTECTOMY    . CYSTOSCOPY W/ RETROGRADES Left 08/04/2018   Procedure: CYSTOSCOPY WITH RETROGRADE PYELOGRAM;  Surgeon: Abbie Sons, MD;  Location: [facility]  ORS;  Service: Urology;  Laterality: Left;  . CYSTOSCOPY WITH STENT PLACEMENT Left 08/04/2018   Procedure: CYSTOSCOPY WITH STENT PLACEMENT;  Surgeon: Abbie Sons, MD;  Location: [facility] ORS;  Service: Urology;  Laterality: Left;  . EXTRACORPOREAL SHOCK WAVE LITHOTRIPSY  2012  . EXTRACORPOREAL SHOCK WAVE LITHOTRIPSY Left 08/20/2018   Procedure: EXTRACORPOREAL SHOCK WAVE LITHOTRIPSY (ESWL);  Surgeon: Hollice Espy, MD;  Location: [facility] ORS;  Service: Urology;  Laterality: Left; (cancelled)  . EXTRACORPOREAL SHOCK WAVE LITHOTRIPSY Left 08/27/2018   Procedure: EXTRACORPOREAL SHOCK WAVE LITHOTRIPSY (ESWL);  Surgeon: Abbie Sons, MD;  Location: [facility] ORS;  Service: Urology;  Laterality: Left;  . EXTRACORPOREAL SHOCK WAVE LITHOTRIPSY Right 10/15/2018   Procedure: EXTRACORPOREAL SHOCK WAVE LITHOTRIPSY (ESWL);  Surgeon: Billey Co, MD;  Location: [facility] ORS;  Service: Urology;  Laterality: Right;  . KIDNEY STONE SURGERY    . LUMBAR FUSION  05/13/2012    Home Medications:  Allergies as of 12/29/2019   No Known Allergies     Medication List       Accurate as of December 29, 2019 11:59 PM. If you have any questions, ask your nurse or doctor.        aspirin EC 81 MG tablet Take 1 tablet (81 mg total) by mouth daily.   atorvastatin 40 MG tablet Commonly known as: LIPITOR Take 1 tablet (40 mg total) by mouth daily at 6 PM.   cloNIDine 0.1 MG tablet Commonly known as: CATAPRES Take 0.1 mg by mouth daily.   clopidogrel  75 MG tablet Commonly known as: PLAVIX Take 1 tablet (75 mg total) by mouth daily.   hydrOXYzine 25 MG capsule Commonly known as: VISTARIL TAKE 1 CAPSULE (25 MG TOTAL) BY MOUTH 2 (TWO) TIMES DAILY AS NEEDED FOR SEVERE ANXIETY SYMPTOMS   lamoTRIgine 100 MG tablet Commonly known as: LaMICtal Take 1 tablet (100 mg total) by mouth daily.   risperiDONE 0.5 MG tablet Commonly known as: RISPERDAL TAKE 1 TABLET (0.5 MG TOTAL) BY MOUTH 2 (TWO) TIMES DAILY. FOR MOOD  LABILITY   tamsulosin 0.4 MG Caps capsule Commonly known as: Flomax Take 1 capsule (0.4 mg total) by mouth daily.   tiZANidine 4 MG tablet Commonly known as: ZANAFLEX Take 4 mg by mouth 2 (two) times daily.   traMADol 50 MG tablet Commonly known as: ULTRAM Take by mouth.   traMADol-acetaminophen 37.5-325 MG tablet Commonly known as: ULTRACET Take 1 tablet by mouth every 8 (eight) hours as needed for moderate pain.   traZODone 150 MG tablet Commonly known as: DESYREL Take 1 tablet (150 mg total) by mouth at bedtime.       Allergies: No Known Allergies  Family History: Family History  Problem Relation Age of Onset  . Stroke Mother   . Diabetes Mother   . Lung cancer Father   . Diabetes Father     Social History:  reports that he has been smoking cigarettes. He has a 26.50 pack-year smoking history. He has never used smokeless tobacco. He reports previous drug use. Drugs: Heroin, Cocaine, and Marijuana. He reports that he does not drink alcohol.   Physical Exam: BP 140/83   Pulse (!) 114   Ht 5\' 2"  (1.575 m)   Wt 145 lb (65.8 kg)   BMI 26.52 kg/m   Constitutional:  Alert and oriented, No acute distress. HEENT: [place] AT, moist mucus membranes.  Trachea midline, no masses. Cardiovascular: No clubbing, cyanosis, or edema.  RRR Respiratory: Normal respiratory effort, no increased work of breathing.  Clear GI: Abdomen is soft, nontender, nondistended, no abdominal masses Lymph: No cervical or inguinal lymphadenopathy. Skin: No rashes, bruises or suspicious lesions. Neurologic: Grossly intact, no focal deficits, moving all 4 extremities. Psychiatric: Normal mood and affect.   Assessment & Plan:    - BPH with lower urinary tract symptoms He could not give a urine specimen in the office and estimated bladder volume by bladder scan was 129 mL.  He has been on tamsulosin approximately 10 days.  I recommended continuing for at least 30 days with a follow-up visit to  reassess symptoms.  If symptoms had not improved we discussed surgical options including TURP/PVP and minimally invasive options including UroLift.  Today he indicated he does not desire to take medication for his voiding symptoms and was interested in proceeding with UroLift. Estimated prostate volume by CT was 38cc.  We discussed the procedure in detail with the most common postoperative side effects of frequency, urgency, dysuria and hematuria.  We will need clearance to stop his Plavix.  The low incidence of sexual side effects were discussed.  We also reviewed that there is no guarantee this will resolve or improve his symptoms.  He indicated all questions were answered and desires to proceed   Abbie Sons, MD  [facility] [address] [place], [place] [address] [phone]

## 2019-12-30 LAB — URINALYSIS, COMPLETE
Bilirubin, UA: NEGATIVE
Glucose, UA: NEGATIVE
Ketones, UA: NEGATIVE
Leukocytes,UA: NEGATIVE
Nitrite, UA: NEGATIVE
Protein,UA: NEGATIVE
Specific Gravity, UA: 1.015 (ref 1.005–1.030)
Urobilinogen, Ur: 0.2 mg/dL (ref 0.2–1.0)
pH, UA: 5.5 (ref 5.0–7.5)

## 2019-12-30 LAB — MICROSCOPIC EXAMINATION
Bacteria, UA: NONE SEEN
RBC, Urine: NONE SEEN /hpf (ref 0–2)

## 2019-12-31 ENCOUNTER — Encounter: Payer: Self-pay | Admitting: Urology

## 2019-12-31 ENCOUNTER — Other Ambulatory Visit: Payer: Self-pay | Admitting: Radiology

## 2019-12-31 DIAGNOSIS — N138 Other obstructive and reflux uropathy: Secondary | ICD-10-CM

## 2019-12-31 DIAGNOSIS — N400 Enlarged prostate without lower urinary tract symptoms: Secondary | ICD-10-CM | POA: Insufficient documentation

## 2020-01-05 DIAGNOSIS — E538 Deficiency of other specified B group vitamins: Secondary | ICD-10-CM | POA: Diagnosis not present

## 2020-01-05 DIAGNOSIS — N138 Other obstructive and reflux uropathy: Secondary | ICD-10-CM | POA: Diagnosis not present

## 2020-01-05 DIAGNOSIS — M4800 Spinal stenosis, site unspecified: Secondary | ICD-10-CM | POA: Diagnosis not present

## 2020-01-05 DIAGNOSIS — Z01818 Encounter for other preprocedural examination: Secondary | ICD-10-CM | POA: Diagnosis not present

## 2020-01-05 DIAGNOSIS — I69998 Other sequelae following unspecified cerebrovascular disease: Secondary | ICD-10-CM | POA: Diagnosis not present

## 2020-01-05 DIAGNOSIS — I10 Essential (primary) hypertension: Secondary | ICD-10-CM | POA: Diagnosis not present

## 2020-01-05 DIAGNOSIS — M1990 Unspecified osteoarthritis, unspecified site: Secondary | ICD-10-CM | POA: Diagnosis not present

## 2020-01-05 DIAGNOSIS — F3341 Major depressive disorder, recurrent, in partial remission: Secondary | ICD-10-CM | POA: Diagnosis not present

## 2020-01-05 DIAGNOSIS — N401 Enlarged prostate with lower urinary tract symptoms: Secondary | ICD-10-CM | POA: Diagnosis not present

## 2020-01-10 ENCOUNTER — Other Ambulatory Visit: Payer: Self-pay

## 2020-01-10 ENCOUNTER — Ambulatory Visit (INDEPENDENT_AMBULATORY_CARE_PROVIDER_SITE_OTHER): Payer: Medicare HMO

## 2020-01-10 DIAGNOSIS — N401 Enlarged prostate with lower urinary tract symptoms: Secondary | ICD-10-CM | POA: Diagnosis not present

## 2020-01-10 DIAGNOSIS — N138 Other obstructive and reflux uropathy: Secondary | ICD-10-CM | POA: Diagnosis not present

## 2020-01-10 MED ORDER — LIDOCAINE HCL URETHRAL/MUCOSAL 2 % EX GEL
1.0000 "application " | Freq: Once | CUTANEOUS | Status: AC
  Administered 2020-01-10: 1 via URETHRAL

## 2020-01-10 NOTE — Progress Notes (Signed)
In and Out Catheterization  Patient is present today for a I & O catheterization due to BPH and the need for pre-op urine culture. Patient was cleaned and prepped in a sterile fashion with betadine . A 14FR Coude cath was inserted no complications were noted , 56ml of urine return was noted, urine was dark yellow in color. A clean urine sample was collected for urine culture. Bladder was drained and catheter was removed with out difficulty.    Preformed by: Gordy Clement, [place]

## 2020-01-12 ENCOUNTER — Encounter
Admission: RE | Admit: 2020-01-12 | Discharge: 2020-01-12 | Disposition: A | Discharge status: Home / Self Care | Payer: Medicare HMO | Source: Ambulatory Visit | Attending: Urology | Admitting: Urology

## 2020-01-12 ENCOUNTER — Other Ambulatory Visit: Payer: Self-pay

## 2020-01-12 HISTORY — DX: Prediabetes: R73.03

## 2020-01-12 LAB — CULTURE, URINE COMPREHENSIVE

## 2020-01-12 NOTE — Pre-Procedure Instructions (Addendum)
Copied from [facility] (most recent cbc and chem in [facility] 11/2019)  ECG 12-lead1/08/2020 [place] Component Name Value Ref Range  Vent Rate (bpm) 109   PR Interval (msec) 188   QRS Interval (msec) 86   QT Interval (msec) 350   QTc (msec) 471   Other Result Information  This result has an attachment that is not available.  Result Narrative  Sinus tachycardia Otherwise normal ECG No previous ECGs available I reviewed and concur with this report. Electronically signed [id number] MD, [place] [phone]) on 11/26/2019 8:25:55 AM  Status

## 2020-01-12 NOTE — Patient Instructions (Signed)
Your procedure is scheduled on: 01/18/20 Report to [facility]. To find out your arrival time please call [phone] between 1PM - 3PM on 01/17/20.  Remember: Instructions that are not followed completely may result in serious medical risk, up to and including death, or upon the discretion of your surgeon and anesthesiologist your surgery may need to be rescheduled.     _X__ 1. Do not eat food after midnight the night before your procedure.                 No gum chewing or hard candies. You may drink clear liquids up to 2 hours                 before you are scheduled to arrive for your surgery- DO not drink clear                 liquids within 2 hours of the start of your surgery.                 Clear Liquids include:  water, apple juice without pulp, clear carbohydrate                 drink such as Clearfast or Gatorade, Black Coffee or Tea (Do not add                 anything to coffee or tea). Diabetics water only  __X__2.  On the morning of surgery brush your teeth with toothpaste and water, you                 may rinse your mouth with mouthwash if you wish.  Do not swallow any              toothpaste of mouthwash.     _X__ 3.  No Alcohol for 24 hours before or after surgery.   _X__ 4.  Do Not Smoke or use e-cigarettes For 24 Hours Prior to Your Surgery.                 Do not use any chewable tobacco products for at least 6 hours prior to                 surgery.  ____  5.  Bring all medications with you on the day of surgery if instructed.   __X__  6.  Notify your doctor if there is any change in your medical condition      (cold, fever, infections).     Do not wear jewelry, make-up, hairpins, clips or nail polish. Do not wear lotions, powders, or perfumes.  Do not shave 48 hours prior to surgery. Men may shave face and neck. Do not bring valuables to the hospital.    [facility] is not responsible for any belongings or  valuables.  Contacts, dentures/partials or body piercings may not be worn into surgery. Bring a case for your contacts, glasses or hearing aids, a denture cup will be supplied. Leave your suitcase in the car. After surgery it may be brought to your room. For patients admitted to the hospital, discharge time is determined by your treatment team.   Patients discharged the day of surgery will not be allowed to drive home.   Please read over the following fact sheets that you were given:   MRSA Information  __X__ Take these medicines the morning of surgery with A SIP OF WATER:  1. atorvastatin (LIPITOR) 40 MG tablet  2. baclofen (LIORESAL) 10 MG tablet  3. hydrOXYzine (VISTARIL) 25 MG capsule  4. LORazepam (ATIVAN) 0.5 MG tablet  5. traMADol (ULTRAM) 50 MG tablet  6.  ____ Fleet Enema (as directed)   ____ Use CHG Soap/SAGE wipes as directed  ____ Use inhalers on the day of surgery  ____ Stop metformin/Janumet/Farxiga 2 days prior to surgery    ____ Take 1/2 of usual insulin dose the night before surgery. No insulin the morning          of surgery.   __X__ Stop Blood Thinners Coumadin/Plavix/Xarelto/Pleta/Pradaxa/Eliquis/Effient/Aspirin according to your doctor's instructions on   Or contact your Surgeon, Cardiologist or Medical Doctor regarding  ability to stop your blood thinners  __X__ Stop Anti-inflammatories 7 days before surgery such as Advil, Ibuprofen, Motrin,  BC or Goodies Powder, Naprosyn, Naproxen, Aleve, Aspirin    __X__ Stop all herbal supplements, fish oil or vitamin E until after surgery.    ____ Bring C-Pap to the hospital.

## 2020-01-14 ENCOUNTER — Other Ambulatory Visit
Admission: RE | Admit: 2020-01-14 | Discharge: 2020-01-14 | Disposition: A | Discharge status: Home / Self Care | Payer: Medicare HMO | Source: Ambulatory Visit | Attending: Urology | Admitting: Urology

## 2020-01-14 DIAGNOSIS — Z20822 Contact with and (suspected) exposure to covid-19: Secondary | ICD-10-CM | POA: Insufficient documentation

## 2020-01-14 DIAGNOSIS — Z01812 Encounter for preprocedural laboratory examination: Secondary | ICD-10-CM | POA: Diagnosis not present

## 2020-01-14 LAB — SARS CORONAVIRUS 2 (TAT 6-24 HRS): SARS Coronavirus 2: NEGATIVE

## 2020-01-18 ENCOUNTER — Ambulatory Visit
Admission: RE | Admit: 2020-01-18 | Discharge: 2020-01-18 | Disposition: A | Discharge status: Home / Self Care | Payer: Medicare HMO | Attending: Urology | Admitting: Urology

## 2020-01-18 ENCOUNTER — Encounter
Admission: RE | Disposition: A | Discharge status: Home / Self Care | Payer: Self-pay | Source: Home / Self Care | Attending: Urology

## 2020-01-18 ENCOUNTER — Ambulatory Visit: Payer: Medicare HMO | Admitting: Anesthesiology

## 2020-01-18 ENCOUNTER — Other Ambulatory Visit: Payer: Self-pay

## 2020-01-18 ENCOUNTER — Encounter: Payer: Self-pay | Admitting: Urology

## 2020-01-18 DIAGNOSIS — R2689 Other abnormalities of gait and mobility: Secondary | ICD-10-CM | POA: Insufficient documentation

## 2020-01-18 DIAGNOSIS — I10 Essential (primary) hypertension: Secondary | ICD-10-CM | POA: Insufficient documentation

## 2020-01-18 DIAGNOSIS — I69398 Other sequelae of cerebral infarction: Secondary | ICD-10-CM | POA: Diagnosis not present

## 2020-01-18 DIAGNOSIS — K219 Gastro-esophageal reflux disease without esophagitis: Secondary | ICD-10-CM | POA: Diagnosis not present

## 2020-01-18 DIAGNOSIS — N401 Enlarged prostate with lower urinary tract symptoms: Secondary | ICD-10-CM

## 2020-01-18 DIAGNOSIS — R3912 Poor urinary stream: Secondary | ICD-10-CM

## 2020-01-18 DIAGNOSIS — F17210 Nicotine dependence, cigarettes, uncomplicated: Secondary | ICD-10-CM | POA: Diagnosis not present

## 2020-01-18 DIAGNOSIS — Z79899 Other long term (current) drug therapy: Secondary | ICD-10-CM | POA: Insufficient documentation

## 2020-01-18 DIAGNOSIS — M5116 Intervertebral disc disorders with radiculopathy, lumbar region: Secondary | ICD-10-CM | POA: Diagnosis not present

## 2020-01-18 DIAGNOSIS — F418 Other specified anxiety disorders: Secondary | ICD-10-CM | POA: Diagnosis not present

## 2020-01-18 DIAGNOSIS — F329 Major depressive disorder, single episode, unspecified: Secondary | ICD-10-CM | POA: Insufficient documentation

## 2020-01-18 DIAGNOSIS — Z7902 Long term (current) use of antithrombotics/antiplatelets: Secondary | ICD-10-CM | POA: Insufficient documentation

## 2020-01-18 DIAGNOSIS — N138 Other obstructive and reflux uropathy: Secondary | ICD-10-CM

## 2020-01-18 DIAGNOSIS — R569 Unspecified convulsions: Secondary | ICD-10-CM | POA: Diagnosis not present

## 2020-01-18 DIAGNOSIS — Z8673 Personal history of transient ischemic attack (TIA), and cerebral infarction without residual deficits: Secondary | ICD-10-CM | POA: Diagnosis not present

## 2020-01-18 DIAGNOSIS — R3911 Hesitancy of micturition: Secondary | ICD-10-CM | POA: Insufficient documentation

## 2020-01-18 DIAGNOSIS — Z7982 Long term (current) use of aspirin: Secondary | ICD-10-CM | POA: Diagnosis not present

## 2020-01-18 DIAGNOSIS — H538 Other visual disturbances: Secondary | ICD-10-CM | POA: Diagnosis not present

## 2020-01-18 DIAGNOSIS — Z87442 Personal history of urinary calculi: Secondary | ICD-10-CM | POA: Insufficient documentation

## 2020-01-18 HISTORY — PX: CYSTOSCOPY WITH INSERTION OF UROLIFT: SHX6678

## 2020-01-18 SURGERY — CYSTOSCOPY WITH INSERTION OF UROLIFT
Anesthesia: General

## 2020-01-18 MED ORDER — PROPOFOL 500 MG/50ML IV EMUL
INTRAVENOUS | Status: DC | PRN
  Administered 2020-01-18: 40 mg via INTRAVENOUS

## 2020-01-18 MED ORDER — FENTANYL CITRATE (PF) 100 MCG/2ML IJ SOLN
INTRAMUSCULAR | Status: DC | PRN
  Administered 2020-01-18: 12.5 ug via INTRAVENOUS
  Administered 2020-01-18: 25 ug via INTRAVENOUS
  Administered 2020-01-18: 12.5 ug via INTRAVENOUS

## 2020-01-18 MED ORDER — EPHEDRINE SULFATE 50 MG/ML IJ SOLN
INTRAMUSCULAR | Status: DC | PRN
  Administered 2020-01-18: 5 mg via INTRAVENOUS

## 2020-01-18 MED ORDER — OXYCODONE HCL 5 MG/5ML PO SOLN: 5.0000 mg | Freq: Once | ORAL | Status: AC | PRN

## 2020-01-18 MED ORDER — FAMOTIDINE 20 MG PO TABS
20.0000 mg | ORAL_TABLET | Freq: Once | ORAL | Status: AC
  Administered 2020-01-18: 20 mg via ORAL

## 2020-01-18 MED ORDER — ONDANSETRON HCL 4 MG/2ML IJ SOLN: 4.0000 mg | Freq: Once | INTRAMUSCULAR | Status: DC | PRN

## 2020-01-18 MED ORDER — OXYCODONE HCL 5 MG PO TABS
ORAL_TABLET | ORAL | Status: AC
  Filled 2020-01-18: qty 1

## 2020-01-18 MED ORDER — FENTANYL CITRATE (PF) 100 MCG/2ML IJ SOLN
INTRAMUSCULAR | Status: AC
  Administered 2020-01-18: 25 ug via INTRAVENOUS
  Filled 2020-01-18: qty 2

## 2020-01-18 MED ORDER — ACETAMINOPHEN 10 MG/ML IV SOLN: 1000.0000 mg | Freq: Once | INTRAVENOUS | Status: DC | PRN

## 2020-01-18 MED ORDER — OXYCODONE HCL 5 MG PO TABS
5.0000 mg | ORAL_TABLET | Freq: Once | ORAL | Status: AC | PRN
  Administered 2020-01-18: 5 mg via ORAL

## 2020-01-18 MED ORDER — LACTATED RINGERS IV SOLN: INTRAVENOUS | Status: DC

## 2020-01-18 MED ORDER — PROPOFOL 500 MG/50ML IV EMUL
INTRAVENOUS | Status: DC | PRN
  Administered 2020-01-18: 75 ug/kg/min via INTRAVENOUS

## 2020-01-18 MED ORDER — LABETALOL HCL 5 MG/ML IV SOLN
INTRAVENOUS | Status: AC
  Administered 2020-01-18: 5 mg via INTRAVENOUS
  Filled 2020-01-18: qty 4

## 2020-01-18 MED ORDER — FENTANYL CITRATE (PF) 100 MCG/2ML IJ SOLN
25.0000 ug | INTRAMUSCULAR | Status: DC | PRN
  Administered 2020-01-18 (×3): 25 ug via INTRAVENOUS

## 2020-01-18 MED ORDER — PHENYLEPHRINE HCL (PRESSORS) 10 MG/ML IV SOLN
INTRAVENOUS | Status: DC | PRN
  Administered 2020-01-18: 25 ug via INTRAVENOUS

## 2020-01-18 MED ORDER — PROPOFOL 500 MG/50ML IV EMUL
INTRAVENOUS | Status: AC
  Filled 2020-01-18: qty 50

## 2020-01-18 MED ORDER — FAMOTIDINE 20 MG PO TABS
ORAL_TABLET | ORAL | Status: AC
  Filled 2020-01-18: qty 1

## 2020-01-18 MED ORDER — CEFAZOLIN SODIUM-DEXTROSE 2-4 GM/100ML-% IV SOLN
INTRAVENOUS | Status: AC
  Filled 2020-01-18: qty 100

## 2020-01-18 MED ORDER — LABETALOL HCL 5 MG/ML IV SOLN
5.0000 mg | INTRAVENOUS | Status: DC | PRN
  Administered 2020-01-18 (×2): 5 mg via INTRAVENOUS

## 2020-01-18 MED ORDER — URIBEL 118 MG PO CAPS: 1.0000 | ORAL_CAPSULE | Freq: Three times a day (TID) | ORAL | 0 refills | Status: DC | PRN

## 2020-01-18 MED ORDER — LIDOCAINE HCL (CARDIAC) PF 100 MG/5ML IV SOSY
PREFILLED_SYRINGE | INTRAVENOUS | Status: DC | PRN
  Administered 2020-01-18: 100 mg via INTRAVENOUS

## 2020-01-18 MED ORDER — FENTANYL CITRATE (PF) 100 MCG/2ML IJ SOLN
INTRAMUSCULAR | Status: AC
  Filled 2020-01-18: qty 2

## 2020-01-18 MED ORDER — ONDANSETRON HCL 4 MG/2ML IJ SOLN
INTRAMUSCULAR | Status: DC | PRN
  Administered 2020-01-18: 4 mg via INTRAVENOUS

## 2020-01-18 MED ORDER — GLYCOPYRROLATE 0.2 MG/ML IJ SOLN
INTRAMUSCULAR | Status: DC | PRN
  Administered 2020-01-18: .2 mg via INTRAVENOUS

## 2020-01-18 MED ORDER — CEFAZOLIN SODIUM-DEXTROSE 2-4 GM/100ML-% IV SOLN
2.0000 g | INTRAVENOUS | Status: AC
  Administered 2020-01-18: 2 g via INTRAVENOUS

## 2020-01-18 SURGICAL SUPPLY — 15 items
BAG DRAIN CYSTO-URO LG1000N (MISCELLANEOUS) ×2 IMPLANT
BRUSH SCRUB EZ  4% CHG (MISCELLANEOUS) ×1
BRUSH SCRUB EZ 4% CHG (MISCELLANEOUS) ×1 IMPLANT
GLOVE BIO SURGEON STRL SZ8 (GLOVE) ×2 IMPLANT
GOWN STRL REUS W/ TWL LRG LVL3 (GOWN DISPOSABLE) ×1 IMPLANT
GOWN STRL REUS W/ TWL XL LVL3 (GOWN DISPOSABLE) ×1 IMPLANT
GOWN STRL REUS W/TWL LRG LVL3 (GOWN DISPOSABLE) ×1
GOWN STRL REUS W/TWL XL LVL3 (GOWN DISPOSABLE) ×1
KIT TURNOVER CYSTO (KITS) ×2 IMPLANT
PACK CYSTO AR (MISCELLANEOUS) ×2 IMPLANT
SET CYSTO W/LG BORE CLAMP LF (SET/KITS/TRAYS/PACK) ×2 IMPLANT
SURGILUBE 2OZ TUBE FLIPTOP (MISCELLANEOUS) ×2 IMPLANT
SYSTEM UROLIFT (Male Continence) ×4 IMPLANT
WATER STERILE IRR 1000ML POUR (IV SOLUTION) ×2 IMPLANT
WATER STERILE IRR 3000ML UROMA (IV SOLUTION) ×2 IMPLANT

## 2020-01-18 NOTE — Anesthesia Postprocedure Evaluation (Signed)
Anesthesia Post Note  Patient: [name]  Procedure(s) Performed: CYSTOSCOPY WITH INSERTION OF UROLIFT (N/A )  Patient location during evaluation: PACU Anesthesia Type: General Level of consciousness: awake and alert Pain management: pain level controlled Vital Signs Assessment: post-procedure vital signs reviewed and stable Respiratory status: spontaneous breathing, nonlabored ventilation, respiratory function stable and patient connected to nasal cannula oxygen Cardiovascular status: blood pressure returned to baseline and stable Postop Assessment: no apparent nausea or vomiting Anesthetic complications: no     Last Vitals:  Vitals:   01/18/20 1736 01/18/20 1747  BP: (!) 175/104 (!) 162/69  Pulse: 76 68  Resp: 18 16  Temp: (!) 36.2 C   SpO2: 94% 100%    Last Pain:  Vitals:   01/18/20 1736  TempSrc: Temporal  PainSc: 3                  Martha Clan

## 2020-01-18 NOTE — Discharge Instructions (Signed)
Urolift Post-Operative Instructions        Patient Expectations     1. Mild hematuria for about 1 week.  2. Dysuria, frequency, and urgency for 10 days.  3. Mild pelvic pain 1-2 weeks.    Return to Activity    1. Drink water post procedure.  2. Take meds as needed.  3. No lifting or straining 48hrs.  4. Other activity when they feel up to it.     Medications    1. NSAIDS as needed.  2. Pyridium / Uribel for dysuria x10 days as needed  3. Continue BPH Medications ( if on any ) for 10-14 days.     Follow-Up    1.  Follow-up visit 1 month  AMBULATORY SURGERY  DISCHARGE INSTRUCTIONS   1) The drugs that you were given will stay in your system until tomorrow so for the next 24 hours you should not:  A) Drive an automobile B) Make any legal decisions C) Drink any alcoholic beverage   2) You may resume regular meals tomorrow.  Today it is better to start with liquids and gradually work up to solid foods.  You may eat anything you prefer, but it is better to start with liquids, then soup and crackers, and gradually work up to solid foods.   3) Please notify your doctor immediately if you have any unusual bleeding, trouble breathing, redness and pain at the surgery site, drainage, fever, or pain not relieved by medication.    4) Additional Instructions:        Please contact your physician with any problems or Same Day Surgery at [phone], Monday through Friday 6 am to 4 pm, or [place] at [facility] number at [phone].

## 2020-01-18 NOTE — Op Note (Signed)
Preoperative diagnosis: BPH with LUTS  Postoperative diagnosis: BPH with LUTS  Procedure: Urolift (placement of 4 implants)  Surgeon: Bernardo Heater  Anesthesia: MAC  Complications: None  Drains: None  Estimated blood loss: < 5 mL  Indications: 74 year-old male with obstructive symptomatology secondary to BPH.  He has moderate lower urinary tract symptoms however does not desire to treat with medication.  Management options including TURP with resection/ablation of the prostate as well as Urolift were discussed.  The patient has chosen to have a Urolift procedure.  He has been instructed to the procedure as well as risks and complications which include but are not limited to infection, bleeding, and inadequate treatment with the Urolift procedure alone, anesthetic complications, among others.  He understands these and desires to proceed.  Findings: Using the 17 French cystoscope, urethra and bladder were inspected.  There were no urethral lesions.  Prostatic urethra demonstrated moderate lateral lobe enlargement.  The bladder was inspected circumferentially.  This revealed normal findings.  Description of procedure: The patient was properly identified in the holding area.  He received preoperative IV antibiotics.  He was taken to the operating room where the sedation was obtained with a propofol drip.  He was placed in the dorsolithotomy position.  Genitalia and perineum were prepped and draped.  Proper timeout was performed.  A 4F cystoscope was inserted into the bladder. The cystoscopy bridge was replaced with a UroLift delivery device.  The 1st pair of implants were placed 1.5-2 cm from the bladder neck.  The 2nd pair of implants were placed just proximal to the verumontanum. The delivery device was then replaced with cystoscope and bridge and the implant location and opening effect was confirmed cystoscopically.  With the cystoscope positioned at the verumontanum a continuous anterior channel was  present throughout the prostatic urethra.  No additional implants were placed.   He tolerated procedure well and was transported to the PACU in stable condition.  Plan: Office follow-up will be scheduled in 3-4 weeks.   Cameron Giovanni, MD

## 2020-01-18 NOTE — Transfer of Care (Signed)
Immediate Anesthesia Transfer of Care Note  Patient: [name]  Procedure(s) Performed: CYSTOSCOPY WITH INSERTION OF UROLIFT (N/A )  Patient Location: PACU  Anesthesia Type:General  Level of Consciousness: awake  Airway & Oxygen Therapy: Patient Spontanous Breathing and Patient connected to face mask oxygen  Post-op Assessment: Report given to RN and Post -op Vital signs reviewed and stable  Post vital signs: Reviewed  Last Vitals:  Vitals Value Taken Time  BP 117/66 01/18/20 1549  Temp 35.9 C 01/18/20 1549  Pulse 82 01/18/20 1550  Resp 10 01/18/20 1550  SpO2 100 % 01/18/20 1550  Vitals shown include unvalidated device data.  Last Pain:  Vitals:   01/18/20 1402  TempSrc: Temporal  PainSc: 8          Complications: No apparent anesthesia complications

## 2020-01-18 NOTE — Anesthesia Preprocedure Evaluation (Signed)
Anesthesia Evaluation  Patient identified by MRN, date of birth, ID band Patient awake    Reviewed: Allergy & Precautions, NPO status , Patient's Chart, lab work & pertinent test results  History of Anesthesia Complications Negative for: history of anesthetic complications  Airway Mallampati: III  TM Distance: >3 FB Neck ROM: Full    Dental  (+) Edentulous Upper, Poor Dentition, Missing,    Pulmonary neg sleep apnea, neg COPD, Current Smoker and Patient abstained from smoking.,    breath sounds clear to auscultation- rhonchi (-) wheezing      Cardiovascular hypertension, Pt. on medications + Peripheral Vascular Disease  (-) CAD, (-) Past MI, (-) Cardiac Stents and (-) CABG  Rhythm:Regular Rate:Normal - Systolic murmurs and - Diastolic murmurs    Neuro/Psych PSYCHIATRIC DISORDERS Anxiety Depression CVA (visual and balance problems)    GI/Hepatic GERD  Controlled,(+) Hepatitis -, C  Endo/Other  negative endocrine ROSneg diabetes  Renal/GU Renal disease: hx of nephrolithiasis.     Musculoskeletal  (+) Arthritis ,   Abdominal (+) - obese,   Peds  Hematology negative hematology ROS (+)   Anesthesia Other Findings Past Medical History: No date: Anxiety No date: Arthritis No date: Bell's palsy     Comment:  hx of, right side No date: Depression     Comment:  hx of depression No date: GERD (gastroesophageal reflux disease) No date: Hepatitis     Comment:  hx of hepatitis C No date: History of kidney stones No date: Hypertension No date: Kidney stone     Comment:  hx of 02/2018: Stroke ([place])     Comment:  loss of peripheral vision and balance unsteady No date: Substance abuse ([place])     Comment:  heroin   Reproductive/Obstetrics                             Anesthesia Physical  Anesthesia Plan  ASA: III  Anesthesia Plan: General   Post-op Pain Management:    Induction:  Intravenous  PONV Risk Score and Plan: 2 and Ondansetron, Propofol infusion and TIVA  Airway Management Planned: Natural Airway and LMA  Additional Equipment: None  Intra-op Plan:   Post-operative Plan: Extubation in OR  Informed Consent: I have reviewed the patients History and Physical, chart, labs and discussed the procedure including the risks, benefits and alternatives for the proposed anesthesia with the patient or authorized representative who has indicated his/her understanding and acceptance.     Dental advisory given  Plan Discussed with: CRNA and Anesthesiologist  Anesthesia Plan Comments: (Discussed risks of anesthesia with patient, including possibility of difficulty with spontaneous ventilation under anesthesia necessitating airway intervention, PONV, and rare risks such as cardiac or respiratory or neurological events. Patient understands.)        Anesthesia Quick Evaluation

## 2020-01-18 NOTE — Interval H&P Note (Signed)
History and Physical Interval Note:  01/18/2020 2:59 PM  [name]  has presented today for surgery, with the diagnosis of BPH with lower urinary tract symptoms.  The various methods of treatment have been discussed with the patient and family. After consideration of risks, benefits and other options for treatment, the patient has consented to  Procedure(s): CYSTOSCOPY WITH INSERTION OF UROLIFT (N/A) as a surgical intervention.  The patient's history has been reviewed, patient examined, no change in status, stable for surgery.  I have reviewed the patient's chart and labs.  Questions were answered to the patient's satisfaction.     [place]

## 2020-01-19 ENCOUNTER — Ambulatory Visit (INDEPENDENT_AMBULATORY_CARE_PROVIDER_SITE_OTHER): Payer: Medicare HMO | Admitting: Urology

## 2020-01-19 VITALS — BP 123/75 | HR 79 | Ht 62.0 in | Wt 145.0 lb

## 2020-01-19 DIAGNOSIS — R300 Dysuria: Secondary | ICD-10-CM

## 2020-01-19 LAB — BLADDER SCAN AMB NON-IMAGING: Scan Result: 13

## 2020-01-19 MED ORDER — TAMSULOSIN HCL 0.4 MG PO CAPS: 0.4000 mg | ORAL_CAPSULE | Freq: Every day | ORAL | 1 refills | Status: DC

## 2020-01-19 NOTE — Progress Notes (Signed)
01/19/2020 4:34 PM   [name] 1946-03-27 [id number]  Referring provider: Tracie Harrier, [place] [facility] [place],  [place] [address]  Chief Complaint  Patient presents with  . Urinary Incontinence    HPI: [name] is a 74 year old male with a history of hematuria, history of nephrolithiasis and BPH with LU TS who is s/p UroLift yesterday presents today with the complaint of difficulty urinating with pain.  History of hematuria (high risk) Smoker.  CTU 06/2018 normal adrenal glands. Bilateral renal collecting system calculi. Largest stone is in the lower pole right collecting system at 8 mm. There is also a 5 mm stone at the right ureteropelvic junction and a left renal pelvic 8 mm stone. No hydroureter or ureteric calculi. No bladder calculi.  Too small to characterize lesions within both kidneys. An upper pole left renal lesion measures 1.0 cm and is most consistent with a minimally complex cyst. Good renal collecting system opacification on delayed images. Good ureteric opacification, without filling defect.  No enhancing bladder mass or filling defect on delayed images.   Cystoscopy 06/2018 with Dr. Bernardo Heater NED.   He admits to gross hematuria, but he has had a recent surgery.  UA on 12/29/2019 was negative for micro heme.  He could not provide a specimen at this time.    History of nephrolithiasis Stones found on CTU 06/2018 Bilateral renal collecting system calculi. Largest stone is in the lower pole right collecting system at 8 mm. There is also a 5 mm stone at the right ureteropelvic junction and a left renal pelvic 8 mm stone.  Had left URS in 07/2018 with Dr. Bernardo Heater and left ESWL in 08/2018.  Right ESWL in 09/2018 with Dr. Diamantina Providence.  CT renal stone 12/23/2019 no adrenal nodule. Multiple, at least 4, nonobstructing stones in the left kidney, largest in the lower pole measuring 7 mm. No ureteral calculi or hydronephrosis. Scattered low-density  lesions in the left kidney incompletely characterized without contrast but likely cysts. No right hydronephrosis or renal calculi. Urinary bladder is decompressed by Foley catheter and not well assessed.  BPH with LU TS Status post UroLift with Dr. Bernardo Heater yesterday.    He states for 2 to 3 weeks he has been having burning when he tries to urinate.  He has been straining to try to empty his bladder.  The urine just dribbles out.  He states he ran out of his tamsulosin 1 week and a half ago.  Patient denies any modifying or aggravating factors.  Patient denies any gross hematuria, dysuria or suprapubic/flank pain.  Patient denies any fevers, chills, nausea or vomiting.   His PVR is 13 mL.    He states he drinks two 12 ounce bottles of water daily and 1 cup of coffee.  He denies any intake of juice, tea, alcohol or energy drinks.   PMH: Past Medical History:  Diagnosis Date  . Anxiety   . Arthritis    DDD, lumbar radiculopathy  . Bell's palsy    hx of, right side  . Bell's palsy   . Chronic back pain    followed by pain clinic at [facility]  . Depression    hx of depression  . GERD (gastroesophageal reflux disease)   . Hepatitis    hx of hepatitis C treated with Harvoni per patient  . History of kidney stones   . Hypertension   . Kidney stone    hx of  .  Pre-diabetes   . Stroke ([place]) 02/2018   loss of peripheral vision and balance unsteady  . Substance abuse [facility])    heroin    Surgical History: Past Surgical History:  Procedure Laterality Date  . ANTERIOR LUMBAR FUSION  05/13/2012   Procedure: ANTERIOR LUMBAR FUSION 1 LEVEL;  Surgeon: Melina Schools, MD;  Location: [place];  Service: Orthopedics;  Laterality: N/A;  ALIF L5-S1  . CHOLECYSTECTOMY    . CYSTOSCOPY W/ RETROGRADES Left 08/04/2018   Procedure: CYSTOSCOPY WITH RETROGRADE PYELOGRAM;  Surgeon: Abbie Sons, MD;  Location: [facility];  Service: Urology;  Laterality: Left;  . CYSTOSCOPY WITH INSERTION OF UROLIFT N/A 01/18/2020     Procedure: CYSTOSCOPY WITH INSERTION OF UROLIFT;  Surgeon: Abbie Sons, MD;  Location: [facility];  Service: Urology;  Laterality: N/A;  . CYSTOSCOPY WITH STENT PLACEMENT Left 08/04/2018   Procedure: CYSTOSCOPY WITH STENT PLACEMENT;  Surgeon: Abbie Sons, MD;  Location: [facility];  Service: Urology;  Laterality: Left;  . EXTRACORPOREAL SHOCK WAVE LITHOTRIPSY  2012  . EXTRACORPOREAL SHOCK WAVE LITHOTRIPSY Left 08/20/2018   Procedure: EXTRACORPOREAL SHOCK WAVE LITHOTRIPSY (ESWL);  Surgeon: Hollice Espy, MD;  Location: [facility];  Service: Urology;  Laterality: Left; (cancelled)  . EXTRACORPOREAL SHOCK WAVE LITHOTRIPSY Left 08/27/2018   Procedure: EXTRACORPOREAL SHOCK WAVE LITHOTRIPSY (ESWL);  Surgeon: Abbie Sons, MD;  Location: [facility];  Service: Urology;  Laterality: Left;  . EXTRACORPOREAL SHOCK WAVE LITHOTRIPSY Right 10/15/2018   Procedure: EXTRACORPOREAL SHOCK WAVE LITHOTRIPSY (ESWL);  Surgeon: Billey Co, MD;  Location: [facility];  Service: Urology;  Laterality: Right;  . KIDNEY STONE SURGERY    . LUMBAR FUSION  05/13/2012    Home Medications:  Allergies as of 01/19/2020   No Known Allergies     Medication List       Accurate as of January 19, 2020 11:59 PM. If you have any questions, ask your nurse or doctor.        aspirin EC 81 MG tablet Take 1 tablet (81 mg total) by mouth daily.   atorvastatin 40 MG tablet Commonly known as: LIPITOR Take 1 tablet (40 mg total) by mouth daily at 6 PM. What changed: when to take this   baclofen 10 MG tablet Commonly known as: LIORESAL Take 10 mg by mouth 3 (three) times daily.   citalopram 10 MG tablet Commonly known as: CELEXA Take 10 mg by mouth daily.   cloNIDine 0.1 MG tablet Commonly known as: CATAPRES Take 0.1 mg by mouth 2 (two) times a week.   clopidogrel 75 MG tablet Commonly known as: PLAVIX Take 1 tablet (75 mg total) by mouth daily.   hydrOXYzine 25 MG capsule Commonly known as: VISTARIL TAKE 1  CAPSULE (25 MG TOTAL) BY MOUTH 2 (TWO) TIMES DAILY AS NEEDED FOR SEVERE ANXIETY SYMPTOMS What changed:   when to take this  additional instructions   LORazepam 0.5 MG tablet Commonly known as: ATIVAN Take 0.5 mg by mouth daily.   mometasone 0.1 % cream Commonly known as: ELOCON Apply 1 application topically See admin instructions. INTO EARS DAILY   tamsulosin 0.4 MG Caps capsule Commonly known as: Flomax Take 1 capsule (0.4 mg total) by mouth daily. What changed: Another medication with the same name was added. Make sure you understand how and when to take each. Changed by: Zara Council, PA-C   tamsulosin 0.4 MG Caps capsule Commonly known as: FLOMAX Take 1 capsule (0.4 mg total) by mouth daily. What changed: You  were already taking a medication with the same name, and this prescription was added. Make sure you understand how and when to take each. Changed by: Zara Council, PA-C   tiZANidine 4 MG tablet Commonly known as: ZANAFLEX Take 4 mg by mouth in the morning, at noon, and at bedtime.   traMADol 50 MG tablet Commonly known as: ULTRAM Take 50 mg by mouth 2 (two) times daily.   traZODone 150 MG tablet Commonly known as: DESYREL Take 1 tablet (150 mg total) by mouth at bedtime.   Uribel 118 MG Caps Take 1 capsule (118 mg total) by mouth 3 (three) times daily as needed (Urinary frequency, urgency, burning).       Allergies: No Known Allergies  Family History: Family History  Problem Relation Age of Onset  . Stroke Mother   . Diabetes Mother   . Lung cancer Father   . Diabetes Father     Social History:  reports that he has been smoking cigarettes. He has a 13.25 pack-year smoking history. He has never used smokeless tobacco. He reports previous drug use. Drugs: Heroin, Cocaine, and Marijuana. He reports that he does not drink alcohol.  ROS: Pertinent ROS in HPI  Physical Exam: BP 123/75   Pulse 79   Ht 5\' 2"  (1.575 m)   Wt 145 lb (65.8 kg)    BMI 26.52 kg/m   Constitutional:  Well nourished. Alert and oriented, No acute distress. HEENT: [place] AT, mask in place.  Trachea midline, no masses. Cardiovascular: No clubbing, cyanosis, or edema. Respiratory: Normal respiratory effort, no increased work of breathing. Neurologic: Grossly intact, no focal deficits, moving all 4 extremities. Psychiatric: Normal mood and affect.  Laboratory Data: Lab Results  Component Value Date   WBC 12.0 (H) 06/03/2018   HGB 13.5 06/03/2018   HCT 40.0 06/03/2018   MCV 94.0 06/03/2018   PLT 233 06/03/2018   Lab Results  Component Value Date   CREATININE 0.85 06/03/2018      Component Value Date/Time   CHOL 200 03/13/2018 0702   HDL 20 (L) 03/13/2018 0702   CHOLHDL 10.0 03/13/2018 0702   VLDL 49 (H) 03/13/2018 0702   LDLCALC 131 (H) 03/13/2018 0702   Lab Results  Component Value Date   AST 23 03/12/2018   Lab Results  Component Value Date   ALT 15 (L) 03/12/2018   I have reviewed the labs.   Pertinent Imaging: Results for ERMEL, BOYCHUK (MRN [id number]) as of 01/19/2020 13:55  Ref. Range 01/19/2020 13:53  Scan Result Unknown 13 ML   Assessment & Plan:    1. Dysuria Was concerned that he was in retention, but reassured him that his PVR was minimal - BLADDER SCAN AMB NON-IMAGING Patient was not able to provide an urine specimen at today's visit, he is given a cup and will bring a specimen back in the morning His symptoms seem to coincide to when he ran out of the tamsulosin, so I went ahead and sent a refill to his pharmacy  Has a follow up appointment on 02/10/2020 with Dr. Bernardo Heater    Return for bringing UA in the morning .  These notes generated with voice recognition software. I apologize for typographical errors.  Zara Council, PA-C  [facility] [address]  [place], [place] [address] [phone]

## 2020-01-20 ENCOUNTER — Telehealth: Payer: Self-pay | Admitting: Urology

## 2020-01-20 ENCOUNTER — Encounter: Payer: Self-pay | Admitting: Urology

## 2020-01-20 NOTE — Telephone Encounter (Signed)
Please call [name] and have him bring in an UA if he is still having symptoms on Monday.

## 2020-01-24 NOTE — Telephone Encounter (Signed)
Patient called to report continued urinary incontinence and pain with urination. Appointment made for UA per Zara Council, PA-C. Patient requested appointment on 01/25/2020 due to transportation.

## 2020-01-25 ENCOUNTER — Other Ambulatory Visit: Payer: Self-pay

## 2020-01-25 ENCOUNTER — Other Ambulatory Visit: Payer: Medicare HMO

## 2020-01-25 DIAGNOSIS — R300 Dysuria: Secondary | ICD-10-CM

## 2020-01-26 ENCOUNTER — Ambulatory Visit: Payer: Medicare HMO | Admitting: Urology

## 2020-01-26 ENCOUNTER — Ambulatory Visit (INDEPENDENT_AMBULATORY_CARE_PROVIDER_SITE_OTHER): Payer: Medicare HMO | Admitting: Urology

## 2020-01-26 ENCOUNTER — Encounter: Payer: Self-pay | Admitting: Urology

## 2020-01-26 VITALS — BP 104/68 | HR 70 | Ht 62.0 in | Wt 145.0 lb

## 2020-01-26 DIAGNOSIS — R300 Dysuria: Secondary | ICD-10-CM

## 2020-01-26 DIAGNOSIS — N3941 Urge incontinence: Secondary | ICD-10-CM | POA: Diagnosis not present

## 2020-01-26 LAB — URINALYSIS, COMPLETE
Bilirubin, UA: NEGATIVE
Glucose, UA: NEGATIVE
Ketones, UA: NEGATIVE
Leukocytes,UA: NEGATIVE
Nitrite, UA: NEGATIVE
RBC, UA: NEGATIVE
Specific Gravity, UA: >1.03 — ABNORMAL HIGH (ref 1.005–1.030)
Urobilinogen, Ur: 0.2 mg/dL (ref 0.2–1.0)
pH, UA: 5.5 (ref 5.0–7.5)

## 2020-01-26 LAB — MICROSCOPIC EXAMINATION: RBC, Urine: NONE SEEN /hpf (ref 0–2)

## 2020-01-26 LAB — BLADDER SCAN AMB NON-IMAGING: Scan Result: 0

## 2020-01-26 MED ORDER — PHENAZOPYRIDINE HCL 200 MG PO TABS: 200.0000 mg | ORAL_TABLET | Freq: Three times a day (TID) | ORAL | 0 refills | Status: DC | PRN

## 2020-01-26 NOTE — Progress Notes (Signed)
01/26/2020 1:04 PM   CECILIA FORDHAM [birth year] [id number]  Referring provider: Tracie Harrier, MD [address] [facility] [place],   [address]  Chief Complaint  Patient presents with  . Follow-up    HPI: 74 y.o. male who underwent UroLift 3/30 for BPH.  He was seen 1 day postop complaining of urinary frequency and difficulty emptying.  PVR was unremarkable.  Urinalysis on 4/1 was negative.  He comes today complaining of incontinence.  He is wearing depends.  He goes to the bathroom frequently and voids small amounts.  PVR by bladder scan today was 0 mL.  When he was seen in the ED for voiding difficulty his complaints were hesitancy and decreased stream which were improved on tamsulosin however he stated he did not desire to take medication and opted for UroLift.  He denied dysuria or incontinence at that time.  His wife states today he did have urinary frequency, urgency and incontinence prior to the procedure.  It is slightly worse since the procedure but was significant preop.   PMH: Past Medical History:  Diagnosis Date  . Anxiety   . Arthritis    DDD, lumbar radiculopathy  . Bell's palsy    hx of, right side  . Bell's palsy   . Chronic back pain    followed by pain clinic at [facility]  . Depression    hx of depression  . GERD (gastroesophageal reflux disease)   . Hepatitis    hx of hepatitis C treated with Harvoni per patient  . History of kidney stones   . Hypertension   . Kidney stone    hx of  . Pre-diabetes   . Stroke ([place]) 02/2018   loss of peripheral vision and balance unsteady  . Substance abuse [facility])    heroin    Surgical History: Past Surgical History:  Procedure Laterality Date  . ANTERIOR LUMBAR FUSION  05/13/2012   Procedure: ANTERIOR LUMBAR FUSION 1 LEVEL;  Surgeon: Melina Schools, MD;  Location: [place];  Service: Orthopedics;  Laterality: N/A;  ALIF L5-S1  . CHOLECYSTECTOMY    . CYSTOSCOPY W/ RETROGRADES Left 08/04/2018   Procedure: CYSTOSCOPY WITH RETROGRADE PYELOGRAM;  Surgeon: Abbie Sons, MD;  Location: [facility];  Service: Urology;  Laterality: Left;  . CYSTOSCOPY WITH INSERTION OF UROLIFT N/A 01/18/2020   Procedure: CYSTOSCOPY WITH INSERTION OF UROLIFT;  Surgeon: Abbie Sons, MD;  Location: [facility];  Service: Urology;  Laterality: N/A;  . CYSTOSCOPY WITH STENT PLACEMENT Left 08/04/2018   Procedure: CYSTOSCOPY WITH STENT PLACEMENT;  Surgeon: Abbie Sons, MD;  Location: [facility];  Service: Urology;  Laterality: Left;  . EXTRACORPOREAL SHOCK WAVE LITHOTRIPSY  2012  . EXTRACORPOREAL SHOCK WAVE LITHOTRIPSY Left 08/20/2018   Procedure: EXTRACORPOREAL SHOCK WAVE LITHOTRIPSY (ESWL);  Surgeon: Hollice Espy, MD;  Location: [facility];  Service: Urology;  Laterality: Left; (cancelled)  . EXTRACORPOREAL SHOCK WAVE LITHOTRIPSY Left 08/27/2018   Procedure: EXTRACORPOREAL SHOCK WAVE LITHOTRIPSY (ESWL);  Surgeon: Abbie Sons, MD;  Location: [facility];  Service: Urology;  Laterality: Left;  . EXTRACORPOREAL SHOCK WAVE LITHOTRIPSY Right 10/15/2018   Procedure: EXTRACORPOREAL SHOCK WAVE LITHOTRIPSY (ESWL);  Surgeon: Billey Co, MD;  Location: [facility];  Service: Urology;  Laterality: Right;  . KIDNEY STONE SURGERY    . LUMBAR FUSION  05/13/2012    Home Medications:  Allergies as of 01/26/2020   No Known Allergies     Medication List  Accurate as of January 26, 2020  1:04 PM. If you have any questions, ask your nurse or doctor.        aspirin EC 81 MG tablet Take 1 tablet (81 mg total) by mouth daily.   atorvastatin 40 MG tablet Commonly known as: LIPITOR Take 1 tablet (40 mg total) by mouth daily at 6 PM. What changed: when to take this   baclofen 10 MG tablet Commonly known as: LIORESAL Take 10 mg by mouth 3 (three) times daily.   citalopram 10 MG tablet Commonly known as: CELEXA Take 10 mg by mouth daily.   cloNIDine 0.1 MG tablet Commonly known as: CATAPRES Take 0.1 mg by  mouth 2 (two) times a week.   clopidogrel 75 MG tablet Commonly known as: PLAVIX Take 1 tablet (75 mg total) by mouth daily.   hydrOXYzine 25 MG capsule Commonly known as: VISTARIL TAKE 1 CAPSULE (25 MG TOTAL) BY MOUTH 2 (TWO) TIMES DAILY AS NEEDED FOR SEVERE ANXIETY SYMPTOMS What changed:   when to take this  additional instructions   LORazepam 0.5 MG tablet Commonly known as: ATIVAN Take 0.5 mg by mouth daily.   mometasone 0.1 % cream Commonly known as: ELOCON Apply 1 application topically See admin instructions. INTO EARS DAILY   tamsulosin 0.4 MG Caps capsule Commonly known as: Flomax Take 1 capsule (0.4 mg total) by mouth daily.   tamsulosin 0.4 MG Caps capsule Commonly known as: FLOMAX Take 1 capsule (0.4 mg total) by mouth daily.   tiZANidine 4 MG tablet Commonly known as: ZANAFLEX Take 4 mg by mouth in the morning, at noon, and at bedtime.   traMADol 50 MG tablet Commonly known as: ULTRAM Take 50 mg by mouth 2 (two) times daily.   traZODone 150 MG tablet Commonly known as: DESYREL Take 1 tablet (150 mg total) by mouth at bedtime.   Uribel 118 MG Caps Take 1 capsule (118 mg total) by mouth 3 (three) times daily as needed (Urinary frequency, urgency, burning).       Allergies: No Known Allergies  Family History: Family History  Problem Relation Age of Onset  . Stroke Mother   . Diabetes Mother   . Lung cancer Father   . Diabetes Father     Social History:  reports that he has been smoking cigarettes. He has a 13.25 pack-year smoking history. He has never used smokeless tobacco. He reports previous drug use. Drugs: Heroin, Cocaine, and Marijuana. He reports that he does not drink alcohol.   Physical Exam: BP 104/68   Pulse 70   Ht 5\' 2"  (1.575 m)   Wt 145 lb (65.8 kg)   BMI 26.52 kg/m   Constitutional:  Alert, No acute distress. HEENT: [place] AT, moist mucus membranes.  Trachea midline, no masses. Cardiovascular: No clubbing, cyanosis, or  edema. Respiratory: Normal respiratory effort, no increased work of breathing. GI: Abdomen is soft, nontender, nondistended, no abdominal masses GU: No CVA tenderness   Assessment & Plan:    - Urinary incontinence Urge incontinence was present preoperatively.  Slightly worse postop UroLift and he should at least get back to baseline however if he had detrusor overactivity secondary to BPH his storage related symptoms may improve.  He was given Myrbetriq samples 25 mg.  Rx Pyridium sent to pharmacy for dysuria.  He has a follow-up scheduled in approximately 3 weeks.  PVR by bladder scan was 0 mL.   Abbie Sons, MD  [place] [address],  [place], [place] [address] [phone]

## 2020-02-10 ENCOUNTER — Other Ambulatory Visit: Payer: Self-pay

## 2020-02-10 ENCOUNTER — Encounter: Payer: Self-pay | Admitting: Urology

## 2020-02-10 ENCOUNTER — Ambulatory Visit (INDEPENDENT_AMBULATORY_CARE_PROVIDER_SITE_OTHER): Payer: Medicare HMO | Admitting: Urology

## 2020-02-10 VITALS — BP 104/70 | HR 79 | Ht 62.0 in | Wt 145.0 lb

## 2020-02-10 DIAGNOSIS — N401 Enlarged prostate with lower urinary tract symptoms: Secondary | ICD-10-CM | POA: Diagnosis not present

## 2020-02-10 DIAGNOSIS — N200 Calculus of kidney: Secondary | ICD-10-CM

## 2020-02-10 DIAGNOSIS — N138 Other obstructive and reflux uropathy: Secondary | ICD-10-CM | POA: Diagnosis not present

## 2020-02-10 NOTE — Progress Notes (Signed)
02/10/2020 1:26 PM   DANG CORPUS 10/28/1945 [id number]  Referring provider: Tracie Harrier, [facility] [place],  [place] [address]  Chief Complaint  Patient presents with  . Routine Post Op    HPI: 74 y.o. male status post UroLift 3/30.  He was seen 1 week postop complaining of urinary incontinence associated with frequency and urgency.  PVR was 0 mL.  He was given UroLift samples and states his symptoms significantly improved.  He stopped taking the Myrbetriq 2 weeks ago.  His incontinence has resolved.  He is voiding with a good stream.  He was seen in the ED in early March and had a CT which showed bilateral, nonobstructing calculi.  He states he is having mild pelvic discomfort and his wondering if one of his stones has moved.   PMH: Past Medical History:  Diagnosis Date  . Anxiety   . Arthritis    DDD, lumbar radiculopathy  . Bell's palsy    hx of, right side  . Bell's palsy   . Chronic back pain    followed by pain clinic at [facility]  . Depression    hx of depression  . GERD (gastroesophageal reflux disease)   . Hepatitis    hx of hepatitis C treated with Harvoni per patient  . History of kidney stones   . Hypertension   . Kidney stone    hx of  . Pre-diabetes   . Stroke ([place]) 02/2018   loss of peripheral vision and balance unsteady  . Substance abuse [facility])    heroin    Surgical History: Past Surgical History:  Procedure Laterality Date  . ANTERIOR LUMBAR FUSION  05/13/2012   Procedure: ANTERIOR LUMBAR FUSION 1 LEVEL;  Surgeon: Melina Schools, MD;  Location: [place];  Service: Orthopedics;  Laterality: N/A;  ALIF L5-S1  . CHOLECYSTECTOMY    . CYSTOSCOPY W/ RETROGRADES Left 08/04/2018   Procedure: CYSTOSCOPY WITH RETROGRADE PYELOGRAM;  Surgeon: Abbie Sons, MD;  Location: [facility];  Service: Urology;  Laterality: Left;  . CYSTOSCOPY WITH INSERTION OF UROLIFT N/A 01/18/2020   Procedure: CYSTOSCOPY WITH INSERTION OF  UROLIFT;  Surgeon: Abbie Sons, MD;  Location: [facility];  Service: Urology;  Laterality: N/A;  . CYSTOSCOPY WITH STENT PLACEMENT Left 08/04/2018   Procedure: CYSTOSCOPY WITH STENT PLACEMENT;  Surgeon: Abbie Sons, MD;  Location: [facility];  Service: Urology;  Laterality: Left;  . EXTRACORPOREAL SHOCK WAVE LITHOTRIPSY  2012  . EXTRACORPOREAL SHOCK WAVE LITHOTRIPSY Left 08/20/2018   Procedure: EXTRACORPOREAL SHOCK WAVE LITHOTRIPSY (ESWL);  Surgeon: Hollice Espy, MD;  Location: [facility];  Service: Urology;  Laterality: Left; (cancelled)  . EXTRACORPOREAL SHOCK WAVE LITHOTRIPSY Left 08/27/2018   Procedure: EXTRACORPOREAL SHOCK WAVE LITHOTRIPSY (ESWL);  Surgeon: Abbie Sons, MD;  Location: [facility];  Service: Urology;  Laterality: Left;  . EXTRACORPOREAL SHOCK WAVE LITHOTRIPSY Right 10/15/2018   Procedure: EXTRACORPOREAL SHOCK WAVE LITHOTRIPSY (ESWL);  Surgeon: Billey Co, MD;  Location: [facility];  Service: Urology;  Laterality: Right;  . KIDNEY STONE SURGERY    . LUMBAR FUSION  05/13/2012    Home Medications:  Allergies as of 02/10/2020   No Known Allergies     Medication List       Accurate as of February 10, 2020  1:26 PM. If you have any questions, ask your nurse or doctor.        aspirin EC 81 MG tablet Take 1 tablet (81 mg total)  by mouth daily.   atorvastatin 40 MG tablet Commonly known as: LIPITOR Take 1 tablet (40 mg total) by mouth daily at 6 PM. What changed: when to take this   baclofen 10 MG tablet Commonly known as: LIORESAL Take 10 mg by mouth 3 (three) times daily.   citalopram 10 MG tablet Commonly known as: CELEXA Take 10 mg by mouth daily.   cloNIDine 0.1 MG tablet Commonly known as: CATAPRES Take 0.1 mg by mouth 2 (two) times a week.   clopidogrel 75 MG tablet Commonly known as: PLAVIX Take 1 tablet (75 mg total) by mouth daily.   hydrOXYzine 25 MG capsule Commonly known as: VISTARIL TAKE 1 CAPSULE (25 MG TOTAL) BY MOUTH 2 (TWO)  TIMES DAILY AS NEEDED FOR SEVERE ANXIETY SYMPTOMS What changed:   when to take this  additional instructions   LORazepam 0.5 MG tablet Commonly known as: ATIVAN Take 0.5 mg by mouth daily.   mometasone 0.1 % cream Commonly known as: ELOCON Apply 1 application topically See admin instructions. INTO EARS DAILY   phenazopyridine 200 MG tablet Commonly known as: PYRIDIUM Take 1 tablet (200 mg total) by mouth 3 (three) times daily as needed (burning with urination).   tamsulosin 0.4 MG Caps capsule Commonly known as: Flomax Take 1 capsule (0.4 mg total) by mouth daily.   tamsulosin 0.4 MG Caps capsule Commonly known as: FLOMAX Take 1 capsule (0.4 mg total) by mouth daily.   tiZANidine 4 MG tablet Commonly known as: ZANAFLEX Take 4 mg by mouth in the morning, at noon, and at bedtime.   traMADol 50 MG tablet Commonly known as: ULTRAM Take 50 mg by mouth 2 (two) times daily.   traZODone 150 MG tablet Commonly known as: DESYREL Take 1 tablet (150 mg total) by mouth at bedtime.       Allergies: No Known Allergies  Family History: Family History  Problem Relation Age of Onset  . Stroke Mother   . Diabetes Mother   . Lung cancer Father   . Diabetes Father     Social History:  reports that he has been smoking cigarettes. He has a 13.25 pack-year smoking history. He has never used smokeless tobacco. He reports previous drug use. Drugs: Heroin, Cocaine, and Marijuana. He reports that he does not drink alcohol.   Physical Exam: BP 104/70   Pulse 79   Ht 5\' 2"  (1.575 m)   Wt 145 lb (65.8 kg)   BMI 26.52 kg/m   Constitutional:  Alert and oriented, No acute distress. HEENT: [place] AT, moist mucus membranes.  Trachea midline, no masses. Cardiovascular: No clubbing, cyanosis, or edema. Respiratory: Normal respiratory effort, no increased work of breathing.    Assessment & Plan:    - BPH status post UroLift Significant improvement in his voiding pattern.  May  discontinue tamsulosin.  Follow-up with Larene Beach in 6 months.  - Nephrolithiasis KUB ordered and will call with results    Abbie Sons, MD  [facility] [address] [place], [place] [address] [phone]

## 2020-02-12 ENCOUNTER — Other Ambulatory Visit: Payer: Self-pay | Admitting: Urology

## 2020-02-16 ENCOUNTER — Other Ambulatory Visit: Payer: Self-pay | Admitting: Urology

## 2020-02-16 ENCOUNTER — Other Ambulatory Visit: Payer: Self-pay | Admitting: *Deleted

## 2020-02-16 ENCOUNTER — Ambulatory Visit
Admission: RE | Admit: 2020-02-16 | Discharge: 2020-02-16 | Disposition: A | Discharge status: Home / Self Care | Payer: Medicare HMO | Source: Ambulatory Visit | Attending: Urology | Admitting: Urology

## 2020-02-16 DIAGNOSIS — N200 Calculus of kidney: Secondary | ICD-10-CM

## 2020-02-16 DIAGNOSIS — Z8673 Personal history of transient ischemic attack (TIA), and cerebral infarction without residual deficits: Secondary | ICD-10-CM | POA: Diagnosis not present

## 2020-02-16 DIAGNOSIS — F333 Major depressive disorder, recurrent, severe with psychotic symptoms: Secondary | ICD-10-CM | POA: Diagnosis not present

## 2020-02-16 DIAGNOSIS — I10 Essential (primary) hypertension: Secondary | ICD-10-CM | POA: Diagnosis not present

## 2020-02-16 DIAGNOSIS — I739 Peripheral vascular disease, unspecified: Secondary | ICD-10-CM | POA: Diagnosis not present

## 2020-02-16 DIAGNOSIS — M5136 Other intervertebral disc degeneration, lumbar region: Secondary | ICD-10-CM | POA: Diagnosis not present

## 2020-02-16 DIAGNOSIS — B182 Chronic viral hepatitis C: Secondary | ICD-10-CM | POA: Diagnosis not present

## 2020-02-16 DIAGNOSIS — F0150 Vascular dementia without behavioral disturbance: Secondary | ICD-10-CM | POA: Diagnosis not present

## 2020-02-16 DIAGNOSIS — R7309 Other abnormal glucose: Secondary | ICD-10-CM | POA: Diagnosis not present

## 2020-02-16 DIAGNOSIS — F5102 Adjustment insomnia: Secondary | ICD-10-CM | POA: Diagnosis not present

## 2020-02-21 ENCOUNTER — Telehealth: Payer: Self-pay | Admitting: *Deleted

## 2020-02-21 NOTE — Telephone Encounter (Signed)
-----   Message from Abbie Sons, MD sent at 02/20/2020 11:42 AM EDT ----- KUB showed no evidence of ureteral calculi

## 2020-02-21 NOTE — Telephone Encounter (Signed)
Pt aware of results 

## 2020-03-09 ENCOUNTER — Other Ambulatory Visit: Payer: Self-pay | Admitting: Urology

## 2020-04-24 ENCOUNTER — Emergency Department: Payer: Medicare HMO

## 2020-04-24 ENCOUNTER — Other Ambulatory Visit: Payer: Self-pay

## 2020-04-24 ENCOUNTER — Emergency Department
Admission: EM | Admit: 2020-04-24 | Discharge: 2020-04-24 | Disposition: A | Discharge status: Home / Self Care | Payer: Medicare HMO | Attending: Emergency Medicine | Admitting: Emergency Medicine

## 2020-04-24 ENCOUNTER — Encounter: Payer: Self-pay | Admitting: Emergency Medicine

## 2020-04-24 DIAGNOSIS — Z79891 Long term (current) use of opiate analgesic: Secondary | ICD-10-CM | POA: Diagnosis not present

## 2020-04-24 DIAGNOSIS — R7303 Prediabetes: Secondary | ICD-10-CM | POA: Insufficient documentation

## 2020-04-24 DIAGNOSIS — Z79899 Other long term (current) drug therapy: Secondary | ICD-10-CM | POA: Insufficient documentation

## 2020-04-24 DIAGNOSIS — Z7982 Long term (current) use of aspirin: Secondary | ICD-10-CM | POA: Insufficient documentation

## 2020-04-24 DIAGNOSIS — R410 Disorientation, unspecified: Secondary | ICD-10-CM | POA: Diagnosis not present

## 2020-04-24 DIAGNOSIS — I10 Essential (primary) hypertension: Secondary | ICD-10-CM | POA: Diagnosis not present

## 2020-04-24 DIAGNOSIS — Z8673 Personal history of transient ischemic attack (TIA), and cerebral infarction without residual deficits: Secondary | ICD-10-CM | POA: Diagnosis not present

## 2020-04-24 DIAGNOSIS — F17210 Nicotine dependence, cigarettes, uncomplicated: Secondary | ICD-10-CM | POA: Diagnosis not present

## 2020-04-24 DIAGNOSIS — R4182 Altered mental status, unspecified: Secondary | ICD-10-CM | POA: Diagnosis not present

## 2020-04-24 DIAGNOSIS — R441 Visual hallucinations: Secondary | ICD-10-CM | POA: Diagnosis present

## 2020-04-24 LAB — CBC WITH DIFFERENTIAL/PLATELET
Abs Immature Granulocytes: 0.03 10*3/uL (ref 0.00–0.07)
Basophils Absolute: 0 10*3/uL (ref 0.0–0.1)
Basophils Relative: 0 %
Eosinophils Absolute: 0.1 10*3/uL (ref 0.0–0.5)
Eosinophils Relative: 1 %
HCT: 40.5 % (ref 39.0–52.0)
Hemoglobin: 14 g/dL (ref 13.0–17.0)
Immature Granulocytes: 0 %
Lymphocytes Relative: 21 %
Lymphs Abs: 2 10*3/uL (ref 0.7–4.0)
MCH: 30 pg (ref 26.0–34.0)
MCHC: 34.6 g/dL (ref 30.0–36.0)
MCV: 86.9 fL (ref 80.0–100.0)
Monocytes Absolute: 0.6 10*3/uL (ref 0.1–1.0)
Monocytes Relative: 6 %
Neutro Abs: 7.2 10*3/uL (ref 1.7–7.7)
Neutrophils Relative %: 72 %
Platelets: 175 10*3/uL (ref 150–400)
RBC: 4.66 MIL/uL (ref 4.22–5.81)
RDW: 16.9 % — ABNORMAL HIGH (ref 11.5–15.5)
WBC: 9.9 10*3/uL (ref 4.0–10.5)
nRBC: 0 % (ref 0.0–0.2)

## 2020-04-24 LAB — URINALYSIS, COMPLETE (UACMP) WITH MICROSCOPIC
Bacteria, UA: NONE SEEN
Bilirubin Urine: NEGATIVE
Glucose, UA: NEGATIVE mg/dL
Hgb urine dipstick: NEGATIVE
Ketones, ur: 5 mg/dL — AB
Leukocytes,Ua: NEGATIVE
Nitrite: NEGATIVE
Protein, ur: NEGATIVE mg/dL
Specific Gravity, Urine: 1.015 (ref 1.005–1.030)
Squamous Epithelial / HPF: NONE SEEN (ref 0–5)
pH: 6 (ref 5.0–8.0)

## 2020-04-24 LAB — URINE DRUG SCREEN, QUALITATIVE (ARMC ONLY)
Amphetamines, Ur Screen: POSITIVE — AB
Barbiturates, Ur Screen: NOT DETECTED
Benzodiazepine, Ur Scrn: POSITIVE — AB
Cannabinoid 50 Ng, Ur ~~LOC~~: NOT DETECTED
Cocaine Metabolite,Ur ~~LOC~~: NOT DETECTED
MDMA (Ecstasy)Ur Screen: NOT DETECTED
Methadone Scn, Ur: NOT DETECTED
Opiate, Ur Screen: NOT DETECTED
Phencyclidine (PCP) Ur S: NOT DETECTED
Tricyclic, Ur Screen: NOT DETECTED

## 2020-04-24 MED ORDER — SODIUM CHLORIDE 0.9 % IV BOLUS
500.0000 mL | Freq: Once | INTRAVENOUS | Status: AC
  Administered 2020-04-24: 500 mL via INTRAVENOUS

## 2020-04-24 NOTE — ED Triage Notes (Signed)
Pt to ER with wife, wife reports that pt took too many of his lorazepam and tramadol and was hallucinating this am.  Wife states pt has hx of overtaking medications.  Pt denies taking too many medications.  Pt is alert and oriented at this time, but is responding slowly.  Pt has hx of CVA.  Pt is defensive and can not tell RN exactly what he took this am.

## 2020-04-24 NOTE — Discharge Instructions (Addendum)
Please pay close attention to your medications to ensure they are taken as prescribed.  If you feel that these dosages need to be adjusted, please discuss this with Dr. Ginette Pitman to make sure he feels this would be safe and appropriate.    Your tests today have not shown any acute issues.

## 2020-04-24 NOTE — ED Notes (Signed)
Pt requesting to leave. Pt agreeable to wait for test results at this time.

## 2020-04-24 NOTE — ED Notes (Signed)
Ed provider at bedside

## 2020-04-24 NOTE — ED Provider Notes (Signed)
[facility] Emergency Department Provider Note  ____________________________________________  Time seen: Approximately 9:24 AM  I have reviewed the triage vital signs and the nursing notes.   HISTORY  Chief Complaint Hallucinations  HPI [name] is a 74 y.o. male with a history of chronic pain, GERD, hypertension, prior stroke, opiate addiction who is brought to the ED by his spouse this morning due to hallucinations.  She reports patient was clearly having visual hallucinations this morning, characterized by seeing people who were not present.  Patient denies any pain falls or head trauma.  Spouse notes that the patient frequently takes extra of his tramadol and Ativan medication.  This morning she reports seeing him take extra of his medication.  The patient denies this.  He acknowledges that he has a substance abuse history and dependence, but denies any overuse of medication or improper ingestion.  The patient denies any acute symptoms currently and reports that he feels fine.  PDMP: 03/24/2020  1   03/15/2020  Lorazepam 0.5 MG Tablet  30.00  30 Vi Han   [id number]   Nor ([id number])   0  0.50 LME  Medicare   [place]  03/24/2020  1   03/15/2020  Tramadol Hcl 50 MG Tablet  60.00  30 Vi Han   [id number]   [place] ([id number])   0  10.00 MME         Past Medical History:  Diagnosis Date  . Anxiety   . Arthritis    DDD, lumbar radiculopathy  . Bell's palsy    hx of, right side  . Bell's palsy   . Chronic back pain    followed by pain clinic at [facility]  . Depression    hx of depression  . GERD (gastroesophageal reflux disease)   . Hepatitis    hx of hepatitis C treated with Harvoni per patient  . History of kidney stones   . Hypertension   . Kidney stone    hx of  . Pre-diabetes   . Stroke ([place]) 02/2018   loss of peripheral vision and balance unsteady  . Substance abuse [facility])    heroin     Patient Active Problem List   Diagnosis Date Noted  . BPH with  obstruction/lower urinary tract symptoms 12/31/2019  . Ataxia 03/09/2019  . Loss of peripheral visual field, bilateral 03/09/2019  . Seizure-like activity ([place]) 03/09/2019  . Hyperlipidemia 11/25/2018  . Bilateral carotid artery stenosis 11/25/2018  . Failed back syndrome, lumbar 07/23/2018  . Nephrolithiasis 07/08/2018  . History of CVA (cerebrovascular accident) 05/22/2018  . Visual disturbance 05/22/2018  . CVA (cerebral vascular accident) ([place]) 03/12/2018  . PAD (peripheral artery disease) ([place]) 04/20/2017  . Chronic hepatitis C without hepatic coma ([place]) 03/12/2017  . Pain and swelling of lower extremity 01/14/2016  . Chronic hip pain (Right) 01/14/2016  . Osteoarthritis of hip (Right) 01/14/2016  . Generalized anxiety disorder 01/14/2016  . Depression with anxiety 01/14/2016  . Lumbar facet syndrome 01/14/2016  . Clinical depression 01/11/2016  . BP (high blood pressure) 01/11/2016  . Insomnia, psychophysiological 01/11/2016  . Current tobacco use 01/11/2016  . Chronic low back pain (Location of Primary Source of Pain) ([place]) 01/11/2016  . Chronic pain 01/11/2016  . Lumbar spondylosis 01/11/2016  . Long term current use of opiate analgesic 01/11/2016  . Long term prescription opiate use 01/11/2016  . Opiate use (15 MME/Day) 01/11/2016  . Encounter for therapeutic drug level monitoring 01/11/2016  . Encounter for pain  management planning 01/11/2016  . Failed back surgical syndrome (S/P anterior fusion at L5-S1) 01/11/2016  . Lumbar facet arthropathy 01/11/2016  . Lumbosacral radiculitis 08/16/2014  . DDD (degenerative disc disease), lumbar 08/16/2014  . Discogenic low back pain 04/27/2012     Past Surgical History:  Procedure Laterality Date  . ANTERIOR LUMBAR FUSION  05/13/2012   Procedure: ANTERIOR LUMBAR FUSION 1 LEVEL;  Surgeon: Melina Schools, MD;  Location: [place];  Service: Orthopedics;  Laterality: N/A;  ALIF L5-S1  . CHOLECYSTECTOMY    . CYSTOSCOPY W/  RETROGRADES Left 08/04/2018   Procedure: CYSTOSCOPY WITH RETROGRADE PYELOGRAM;  Surgeon: Abbie Sons, MD;  Location: [facility];  Service: Urology;  Laterality: Left;  . CYSTOSCOPY WITH INSERTION OF UROLIFT N/A 01/18/2020   Procedure: CYSTOSCOPY WITH INSERTION OF UROLIFT;  Surgeon: Abbie Sons, MD;  Location: [facility];  Service: Urology;  Laterality: N/A;  . CYSTOSCOPY WITH STENT PLACEMENT Left 08/04/2018   Procedure: CYSTOSCOPY WITH STENT PLACEMENT;  Surgeon: Abbie Sons, MD;  Location: [facility];  Service: Urology;  Laterality: Left;  . EXTRACORPOREAL SHOCK WAVE LITHOTRIPSY  2012  . EXTRACORPOREAL SHOCK WAVE LITHOTRIPSY Left 08/20/2018   Procedure: EXTRACORPOREAL SHOCK WAVE LITHOTRIPSY (ESWL);  Surgeon: Hollice Espy, MD;  Location: [facility];  Service: Urology;  Laterality: Left; (cancelled)  . EXTRACORPOREAL SHOCK WAVE LITHOTRIPSY Left 08/27/2018   Procedure: EXTRACORPOREAL SHOCK WAVE LITHOTRIPSY (ESWL);  Surgeon: Abbie Sons, MD;  Location: [facility];  Service: Urology;  Laterality: Left;  . EXTRACORPOREAL SHOCK WAVE LITHOTRIPSY Right 10/15/2018   Procedure: EXTRACORPOREAL SHOCK WAVE LITHOTRIPSY (ESWL);  Surgeon: Billey Co, MD;  Location: [facility];  Service: Urology;  Laterality: Right;  . KIDNEY STONE SURGERY    . LUMBAR FUSION  05/13/2012     Prior to Admission medications   Medication Sig Start Date End Date Taking? Authorizing Provider  tamsulosin (FLOMAX) 0.4 MG CAPS capsule TAKE 1 CAPSULE BY MOUTH EVERY DAY 02/14/20   Stoioff, Ronda Fairly, MD  aspirin EC 81 MG tablet Take 1 tablet (81 mg total) by mouth daily. 03/13/18   Hillary Bow, MD  atorvastatin (LIPITOR) 40 MG tablet Take 1 tablet (40 mg total) by mouth daily at 6 PM. Patient taking differently: Take 40 mg by mouth daily.  03/13/18   Hillary Bow, MD  baclofen (LIORESAL) 10 MG tablet Take 10 mg by mouth 3 (three) times daily.    [provider]  citalopram (CELEXA) 10 MG tablet Take 10 mg by mouth  daily. 01/06/20   [provider]  cloNIDine (CATAPRES) 0.1 MG tablet Take 0.1 mg by mouth 2 (two) times a week.  12/30/16   [provider]  clopidogrel (PLAVIX) 75 MG tablet Take 1 tablet (75 mg total) by mouth daily. 03/13/18   Hillary Bow, MD  hydrOXYzine (VISTARIL) 25 MG capsule TAKE 1 CAPSULE (25 MG TOTAL) BY MOUTH 2 (TWO) TIMES DAILY AS NEEDED FOR SEVERE ANXIETY SYMPTOMS Patient taking differently: Take 25 mg by mouth in the morning and at bedtime.  04/15/19   Ursula Alert, MD  LORazepam (ATIVAN) 0.5 MG tablet Take 0.5 mg by mouth daily. 12/28/19   [provider]  mometasone (ELOCON) 0.1 % cream Apply 1 application topically See admin instructions. INTO EARS DAILY    [provider]  phenazopyridine (PYRIDIUM) 200 MG tablet Take 1 tablet (200 mg total) by mouth 3 (three) times daily as needed (burning with urination). 01/26/20   Stoioff, Ronda Fairly, MD  tamsulosin (FLOMAX) 0.4  MG CAPS capsule Take 1 capsule (0.4 mg total) by mouth daily. 12/23/19   Lannie Fields, PA-C  tiZANidine (ZANAFLEX) 4 MG tablet Take 4 mg by mouth in the morning, at noon, and at bedtime.  02/16/16   [provider]  traMADol (ULTRAM) 50 MG tablet Take 50 mg by mouth 2 (two) times daily.  12/14/18   [provider]  traZODone (DESYREL) 150 MG tablet Take 1 tablet (150 mg total) by mouth at bedtime. 02/16/19   Ursula Alert, MD     Allergies Patient has no known allergies.   Family History  Problem Relation Age of Onset  . Stroke Mother   . Diabetes Mother   . Lung cancer Father   . Diabetes Father     Social History Social History   Tobacco Use  . Smoking status: Current Every Day Smoker    Packs/day: 0.25    Years: 53.00    Pack years: 13.25    Types: Cigarettes  . Smokeless tobacco: Never Used  Vaping Use  . Vaping Use: Never used  Substance Use Topics  . Alcohol use: Never    Alcohol/week: 0.0 standard drinks  . Drug use: Not Currently     Types: Heroin, Cocaine, Marijuana    Comment: 1960's    Review of Systems  Constitutional:   No fever or chills.  ENT:   No sore throat. No rhinorrhea. Cardiovascular:   No chest pain or syncope. Respiratory:   No dyspnea or cough. Gastrointestinal:   Negative for abdominal pain, vomiting and diarrhea.  Musculoskeletal:   Negative for focal pain or swelling All other systems reviewed and are negative except as documented above in ROS and HPI.  ____________________________________________   PHYSICAL EXAM:  VITAL SIGNS: ED Triage Vitals  Enc Vitals Group     BP 04/24/20 0859 (!) 184/73     Pulse Rate 04/24/20 0859 (!) 101     Resp 04/24/20 0859 16     Temp 04/24/20 0859 98.3 F (36.8 C)     Temp src --      SpO2 04/24/20 0859 96 %     Weight 04/24/20 0900 145 lb 1 oz (65.8 kg)     Height 04/24/20 0900 5\' 2"  (1.575 m)     Head Circumference --      Peak Flow --      Pain Score 04/24/20 0859 0     Pain Loc --      Pain Edu? --      Excl. in [place]? --     Vital signs reviewed, nursing assessments reviewed.   Constitutional:   Alert and oriented. Non-toxic appearance. Eyes:   Conjunctivae are normal. EOMI. PERRL, 2 mm bilaterally. ENT      Head:   Normocephalic and atraumatic.      Nose:   Normal      Mouth/Throat:   Dry mucous membranes      Neck:   No meningismus. Full ROM. Hematological/Lymphatic/Immunilogical:   No cervical lymphadenopathy. Cardiovascular:   RRR, heart rate 95. Symmetric bilateral radial and DP pulses.  No murmurs. Cap refill less than 2 seconds. Respiratory:   Normal respiratory effort without tachypnea/retractions. Breath sounds are clear and equal bilaterally. No wheezes/rales/rhonchi. Gastrointestinal:   Soft and nontender. Non distended. There is no CVA tenderness.  No rebound, rigidity, or guarding.  Musculoskeletal:   Normal range of motion in all extremities. No joint effusions.  No lower extremity tenderness.  No edema. Neurologic:  Slightly slurred speech, normal language. No apparent internal stimuli Motor grossly intact. No acute focal neurologic deficits are appreciated.  Skin:    Skin is warm, dry and intact. No rash noted.  No petechiae, purpura, or bullae.  ____________________________________________    LABS (pertinent positives/negatives) (all labs ordered are listed, but only abnormal results are displayed) Labs Reviewed  CBC WITH DIFFERENTIAL/PLATELET - Abnormal; Notable for the following components:      Result Value   RDW 16.9 (*)    All other components within normal limits  URINALYSIS, COMPLETE (UACMP) WITH MICROSCOPIC - Abnormal; Notable for the following components:   Color, Urine YELLOW (*)    APPearance CLEAR (*)    Ketones, ur 5 (*)    All other components within normal limits  URINE DRUG SCREEN, QUALITATIVE ([facility] ONLY) - Abnormal; Notable for the following components:   Amphetamines, Ur Screen POSITIVE (*)    Benzodiazepine, Ur Scrn POSITIVE (*)    All other components within normal limits  COMPREHENSIVE METABOLIC PANEL  ACETAMINOPHEN LEVEL  ETHANOL  SALICYLATE LEVEL   ____________________________________________   EKG  Interpreted by me  Date: 04/24/2020  Rate: 87  Rhythm: normal sinus rhythm  QRS Axis: normal  Intervals: normal  ST/T Wave abnormalities: normal  Conduction Disutrbances: none  Narrative Interpretation: unremarkable      ____________________________________________    RADIOLOGY  CT HEAD WO CONTRAST  Result Date: 04/24/2020 CLINICAL DATA:  Altered mental status. Hallucinations this morning after taking medication. EXAM: CT HEAD WITHOUT CONTRAST TECHNIQUE: Contiguous axial images were obtained from the base of the skull through the vertex without intravenous contrast. COMPARISON:  06/03/2018 FINDINGS: Brain: There is encephalomalacia in the RIGHT posterior parietal and occipital regions, consistent with remote infarct and stable in appearance. There is  no intra or extra-axial fluid collection or mass lesion. The basilar cisterns and ventricles have a normal appearance. There is no CT evidence for acute infarction or hemorrhage. Vascular: No hyperdense vessel or unexpected calcification. Skull: Normal. Negative for fracture or focal lesion. Sinuses/Orbits: No acute finding. Other: None IMPRESSION: 1. No evidence for acute intracranial abnormality. 2. Remote RIGHT posterior parietal and occipital infarcts. Electronically Signed   By: Nolon Nations M.D.   On: 04/24/2020 10:26    ____________________________________________   PROCEDURES Procedures  ____________________________________________  DIFFERENTIAL DIAGNOSIS   Medication abuse, accidental ingestion, intoxication, delirium, UTI, electrolyte abnormality, dehydration  CLINICAL IMPRESSION / ASSESSMENT AND PLAN / ED COURSE  Medications ordered in the ED: Medications  sodium chloride 0.9 % bolus 500 mL (0 mLs Intravenous Stopped 04/24/20 1111)    Pertinent labs & imaging results that were available during my care of the patient were reviewed by me and considered in my medical decision making (see chart for details).  [name] was evaluated in Emergency Department on 04/24/2020 for the symptoms described in the history of present illness. He was evaluated in the context of the global COVID-19 pandemic, which necessitated consideration that the patient might be at risk for infection with the SARS-CoV-2 virus that causes COVID-19. Institutional protocols and algorithms that pertain to the evaluation of patients at risk for COVID-19 are in a state of rapid change based on information released by regulatory bodies including the CDC and federal and state organizations. These policies and algorithms were followed during the patient's care in the ED.   Patient presents with visual hallucinations, concern for overdose of Ativan and tramadol resulting in minor intoxication.  With his medical  history, possibility of UTI  or electrolyte derangement.  Will check labs, obtain CT head given history of stroke.  If no acute findings and patient remained stable I think he can be discharged home to follow-up with primary care.  Spouse has concerns about his current medication regimen which can be addressed by PCP.    Clinical Course as of Apr 24 1228  Mon Apr 24, 2020  1106 CT head unremarkable.   [PS]  1218 Pt feels much better, back to normal. Stable for DC home. Chem. Panel not resulted, but absent any specific sx, would not expect any abnormal values.    [PS]    Clinical Course User Index [PS] Carrie Mew, MD     ____________________________________________   FINAL CLINICAL IMPRESSION(S) / ED DIAGNOSES    Final diagnoses:  Confusion     ED Discharge Orders    None      Portions of this note were generated with dragon dictation software. Dictation errors may occur despite best attempts at proofreading.   Carrie Mew, MD 04/24/20 1229

## 2020-05-08 DIAGNOSIS — R55 Syncope and collapse: Secondary | ICD-10-CM | POA: Diagnosis not present

## 2020-05-08 DIAGNOSIS — Z8673 Personal history of transient ischemic attack (TIA), and cerebral infarction without residual deficits: Secondary | ICD-10-CM | POA: Diagnosis not present

## 2020-05-08 DIAGNOSIS — I10 Essential (primary) hypertension: Secondary | ICD-10-CM | POA: Diagnosis not present

## 2020-05-08 DIAGNOSIS — H539 Unspecified visual disturbance: Secondary | ICD-10-CM | POA: Diagnosis not present

## 2020-05-08 DIAGNOSIS — F324 Major depressive disorder, single episode, in partial remission: Secondary | ICD-10-CM | POA: Diagnosis not present

## 2020-05-16 DIAGNOSIS — E538 Deficiency of other specified B group vitamins: Secondary | ICD-10-CM | POA: Diagnosis not present

## 2020-05-16 DIAGNOSIS — N401 Enlarged prostate with lower urinary tract symptoms: Secondary | ICD-10-CM | POA: Diagnosis not present

## 2020-05-16 DIAGNOSIS — F3341 Major depressive disorder, recurrent, in partial remission: Secondary | ICD-10-CM | POA: Diagnosis not present

## 2020-05-16 DIAGNOSIS — G4733 Obstructive sleep apnea (adult) (pediatric): Secondary | ICD-10-CM | POA: Diagnosis not present

## 2020-05-16 DIAGNOSIS — H5462 Unqualified visual loss, left eye, normal vision right eye: Secondary | ICD-10-CM | POA: Diagnosis not present

## 2020-05-16 DIAGNOSIS — G8929 Other chronic pain: Secondary | ICD-10-CM | POA: Diagnosis not present

## 2020-05-16 DIAGNOSIS — R338 Other retention of urine: Secondary | ICD-10-CM | POA: Diagnosis not present

## 2020-05-16 DIAGNOSIS — I10 Essential (primary) hypertension: Secondary | ICD-10-CM | POA: Diagnosis not present

## 2020-05-16 DIAGNOSIS — I69398 Other sequelae of cerebral infarction: Secondary | ICD-10-CM | POA: Diagnosis not present

## 2020-05-17 DIAGNOSIS — I10 Essential (primary) hypertension: Secondary | ICD-10-CM | POA: Diagnosis not present

## 2020-05-17 DIAGNOSIS — H5462 Unqualified visual loss, left eye, normal vision right eye: Secondary | ICD-10-CM | POA: Diagnosis not present

## 2020-05-17 DIAGNOSIS — F3341 Major depressive disorder, recurrent, in partial remission: Secondary | ICD-10-CM | POA: Diagnosis not present

## 2020-05-17 DIAGNOSIS — R338 Other retention of urine: Secondary | ICD-10-CM | POA: Diagnosis not present

## 2020-05-17 DIAGNOSIS — E538 Deficiency of other specified B group vitamins: Secondary | ICD-10-CM | POA: Diagnosis not present

## 2020-05-17 DIAGNOSIS — G4733 Obstructive sleep apnea (adult) (pediatric): Secondary | ICD-10-CM | POA: Diagnosis not present

## 2020-05-17 DIAGNOSIS — I69398 Other sequelae of cerebral infarction: Secondary | ICD-10-CM | POA: Diagnosis not present

## 2020-05-17 DIAGNOSIS — G8929 Other chronic pain: Secondary | ICD-10-CM | POA: Diagnosis not present

## 2020-05-17 DIAGNOSIS — N401 Enlarged prostate with lower urinary tract symptoms: Secondary | ICD-10-CM | POA: Diagnosis not present

## 2020-05-18 DIAGNOSIS — F3341 Major depressive disorder, recurrent, in partial remission: Secondary | ICD-10-CM | POA: Diagnosis not present

## 2020-05-18 DIAGNOSIS — I69398 Other sequelae of cerebral infarction: Secondary | ICD-10-CM | POA: Diagnosis not present

## 2020-05-18 DIAGNOSIS — R338 Other retention of urine: Secondary | ICD-10-CM | POA: Diagnosis not present

## 2020-05-18 DIAGNOSIS — I10 Essential (primary) hypertension: Secondary | ICD-10-CM | POA: Diagnosis not present

## 2020-05-18 DIAGNOSIS — G8929 Other chronic pain: Secondary | ICD-10-CM | POA: Diagnosis not present

## 2020-05-18 DIAGNOSIS — H5462 Unqualified visual loss, left eye, normal vision right eye: Secondary | ICD-10-CM | POA: Diagnosis not present

## 2020-05-18 DIAGNOSIS — G4733 Obstructive sleep apnea (adult) (pediatric): Secondary | ICD-10-CM | POA: Diagnosis not present

## 2020-05-18 DIAGNOSIS — N401 Enlarged prostate with lower urinary tract symptoms: Secondary | ICD-10-CM | POA: Diagnosis not present

## 2020-05-18 DIAGNOSIS — E538 Deficiency of other specified B group vitamins: Secondary | ICD-10-CM | POA: Diagnosis not present

## 2020-05-23 DIAGNOSIS — F3341 Major depressive disorder, recurrent, in partial remission: Secondary | ICD-10-CM | POA: Diagnosis not present

## 2020-05-23 DIAGNOSIS — G4733 Obstructive sleep apnea (adult) (pediatric): Secondary | ICD-10-CM | POA: Diagnosis not present

## 2020-05-23 DIAGNOSIS — H5462 Unqualified visual loss, left eye, normal vision right eye: Secondary | ICD-10-CM | POA: Diagnosis not present

## 2020-05-23 DIAGNOSIS — N401 Enlarged prostate with lower urinary tract symptoms: Secondary | ICD-10-CM | POA: Diagnosis not present

## 2020-05-23 DIAGNOSIS — R338 Other retention of urine: Secondary | ICD-10-CM | POA: Diagnosis not present

## 2020-05-23 DIAGNOSIS — G8929 Other chronic pain: Secondary | ICD-10-CM | POA: Diagnosis not present

## 2020-05-23 DIAGNOSIS — I69398 Other sequelae of cerebral infarction: Secondary | ICD-10-CM | POA: Diagnosis not present

## 2020-05-23 DIAGNOSIS — I10 Essential (primary) hypertension: Secondary | ICD-10-CM | POA: Diagnosis not present

## 2020-05-23 DIAGNOSIS — E538 Deficiency of other specified B group vitamins: Secondary | ICD-10-CM | POA: Diagnosis not present

## 2020-05-25 DIAGNOSIS — I10 Essential (primary) hypertension: Secondary | ICD-10-CM | POA: Diagnosis not present

## 2020-05-25 DIAGNOSIS — G4733 Obstructive sleep apnea (adult) (pediatric): Secondary | ICD-10-CM | POA: Diagnosis not present

## 2020-05-25 DIAGNOSIS — G8929 Other chronic pain: Secondary | ICD-10-CM | POA: Diagnosis not present

## 2020-05-25 DIAGNOSIS — F3341 Major depressive disorder, recurrent, in partial remission: Secondary | ICD-10-CM | POA: Diagnosis not present

## 2020-05-25 DIAGNOSIS — E538 Deficiency of other specified B group vitamins: Secondary | ICD-10-CM | POA: Diagnosis not present

## 2020-05-25 DIAGNOSIS — R338 Other retention of urine: Secondary | ICD-10-CM | POA: Diagnosis not present

## 2020-05-25 DIAGNOSIS — H5462 Unqualified visual loss, left eye, normal vision right eye: Secondary | ICD-10-CM | POA: Diagnosis not present

## 2020-05-25 DIAGNOSIS — N401 Enlarged prostate with lower urinary tract symptoms: Secondary | ICD-10-CM | POA: Diagnosis not present

## 2020-05-25 DIAGNOSIS — I69398 Other sequelae of cerebral infarction: Secondary | ICD-10-CM | POA: Diagnosis not present

## 2020-05-30 DIAGNOSIS — G4733 Obstructive sleep apnea (adult) (pediatric): Secondary | ICD-10-CM | POA: Diagnosis not present

## 2020-05-30 DIAGNOSIS — G8929 Other chronic pain: Secondary | ICD-10-CM | POA: Diagnosis not present

## 2020-05-30 DIAGNOSIS — I10 Essential (primary) hypertension: Secondary | ICD-10-CM | POA: Diagnosis not present

## 2020-05-30 DIAGNOSIS — H5462 Unqualified visual loss, left eye, normal vision right eye: Secondary | ICD-10-CM | POA: Diagnosis not present

## 2020-05-30 DIAGNOSIS — R338 Other retention of urine: Secondary | ICD-10-CM | POA: Diagnosis not present

## 2020-05-30 DIAGNOSIS — E538 Deficiency of other specified B group vitamins: Secondary | ICD-10-CM | POA: Diagnosis not present

## 2020-05-30 DIAGNOSIS — N401 Enlarged prostate with lower urinary tract symptoms: Secondary | ICD-10-CM | POA: Diagnosis not present

## 2020-05-30 DIAGNOSIS — F3341 Major depressive disorder, recurrent, in partial remission: Secondary | ICD-10-CM | POA: Diagnosis not present

## 2020-05-30 DIAGNOSIS — I69398 Other sequelae of cerebral infarction: Secondary | ICD-10-CM | POA: Diagnosis not present

## 2020-05-31 DIAGNOSIS — I10 Essential (primary) hypertension: Secondary | ICD-10-CM | POA: Diagnosis not present

## 2020-05-31 DIAGNOSIS — H5462 Unqualified visual loss, left eye, normal vision right eye: Secondary | ICD-10-CM | POA: Diagnosis not present

## 2020-05-31 DIAGNOSIS — E538 Deficiency of other specified B group vitamins: Secondary | ICD-10-CM | POA: Diagnosis not present

## 2020-05-31 DIAGNOSIS — I69398 Other sequelae of cerebral infarction: Secondary | ICD-10-CM | POA: Diagnosis not present

## 2020-05-31 DIAGNOSIS — N401 Enlarged prostate with lower urinary tract symptoms: Secondary | ICD-10-CM | POA: Diagnosis not present

## 2020-05-31 DIAGNOSIS — G4733 Obstructive sleep apnea (adult) (pediatric): Secondary | ICD-10-CM | POA: Diagnosis not present

## 2020-05-31 DIAGNOSIS — R338 Other retention of urine: Secondary | ICD-10-CM | POA: Diagnosis not present

## 2020-05-31 DIAGNOSIS — F3341 Major depressive disorder, recurrent, in partial remission: Secondary | ICD-10-CM | POA: Diagnosis not present

## 2020-05-31 DIAGNOSIS — G8929 Other chronic pain: Secondary | ICD-10-CM | POA: Diagnosis not present

## 2020-06-05 DIAGNOSIS — G4733 Obstructive sleep apnea (adult) (pediatric): Secondary | ICD-10-CM | POA: Diagnosis not present

## 2020-06-05 DIAGNOSIS — H5462 Unqualified visual loss, left eye, normal vision right eye: Secondary | ICD-10-CM | POA: Diagnosis not present

## 2020-06-05 DIAGNOSIS — R338 Other retention of urine: Secondary | ICD-10-CM | POA: Diagnosis not present

## 2020-06-05 DIAGNOSIS — I10 Essential (primary) hypertension: Secondary | ICD-10-CM | POA: Diagnosis not present

## 2020-06-05 DIAGNOSIS — G8929 Other chronic pain: Secondary | ICD-10-CM | POA: Diagnosis not present

## 2020-06-05 DIAGNOSIS — I69398 Other sequelae of cerebral infarction: Secondary | ICD-10-CM | POA: Diagnosis not present

## 2020-06-05 DIAGNOSIS — F3341 Major depressive disorder, recurrent, in partial remission: Secondary | ICD-10-CM | POA: Diagnosis not present

## 2020-06-05 DIAGNOSIS — N401 Enlarged prostate with lower urinary tract symptoms: Secondary | ICD-10-CM | POA: Diagnosis not present

## 2020-06-05 DIAGNOSIS — E538 Deficiency of other specified B group vitamins: Secondary | ICD-10-CM | POA: Diagnosis not present

## 2020-06-06 DIAGNOSIS — H5462 Unqualified visual loss, left eye, normal vision right eye: Secondary | ICD-10-CM | POA: Diagnosis not present

## 2020-06-06 DIAGNOSIS — G4733 Obstructive sleep apnea (adult) (pediatric): Secondary | ICD-10-CM | POA: Diagnosis not present

## 2020-06-06 DIAGNOSIS — F3341 Major depressive disorder, recurrent, in partial remission: Secondary | ICD-10-CM | POA: Diagnosis not present

## 2020-06-06 DIAGNOSIS — E538 Deficiency of other specified B group vitamins: Secondary | ICD-10-CM | POA: Diagnosis not present

## 2020-06-06 DIAGNOSIS — G8929 Other chronic pain: Secondary | ICD-10-CM | POA: Diagnosis not present

## 2020-06-06 DIAGNOSIS — N401 Enlarged prostate with lower urinary tract symptoms: Secondary | ICD-10-CM | POA: Diagnosis not present

## 2020-06-06 DIAGNOSIS — R338 Other retention of urine: Secondary | ICD-10-CM | POA: Diagnosis not present

## 2020-06-06 DIAGNOSIS — I10 Essential (primary) hypertension: Secondary | ICD-10-CM | POA: Diagnosis not present

## 2020-06-06 DIAGNOSIS — I69398 Other sequelae of cerebral infarction: Secondary | ICD-10-CM | POA: Diagnosis not present

## 2020-06-13 DIAGNOSIS — E538 Deficiency of other specified B group vitamins: Secondary | ICD-10-CM | POA: Diagnosis not present

## 2020-06-13 DIAGNOSIS — I69398 Other sequelae of cerebral infarction: Secondary | ICD-10-CM | POA: Diagnosis not present

## 2020-06-13 DIAGNOSIS — N401 Enlarged prostate with lower urinary tract symptoms: Secondary | ICD-10-CM | POA: Diagnosis not present

## 2020-06-13 DIAGNOSIS — G4733 Obstructive sleep apnea (adult) (pediatric): Secondary | ICD-10-CM | POA: Diagnosis not present

## 2020-06-13 DIAGNOSIS — H5462 Unqualified visual loss, left eye, normal vision right eye: Secondary | ICD-10-CM | POA: Diagnosis not present

## 2020-06-13 DIAGNOSIS — F3341 Major depressive disorder, recurrent, in partial remission: Secondary | ICD-10-CM | POA: Diagnosis not present

## 2020-06-13 DIAGNOSIS — G8929 Other chronic pain: Secondary | ICD-10-CM | POA: Diagnosis not present

## 2020-06-13 DIAGNOSIS — I10 Essential (primary) hypertension: Secondary | ICD-10-CM | POA: Diagnosis not present

## 2020-06-13 DIAGNOSIS — R338 Other retention of urine: Secondary | ICD-10-CM | POA: Diagnosis not present

## 2020-08-11 ENCOUNTER — Ambulatory Visit: Payer: Self-pay | Admitting: Urology

## 2020-08-11 ENCOUNTER — Encounter: Payer: Self-pay | Admitting: Urology

## 2020-08-18 ENCOUNTER — Other Ambulatory Visit: Payer: Self-pay | Admitting: Internal Medicine

## 2020-08-18 ENCOUNTER — Ambulatory Visit: Payer: Medicare HMO

## 2020-08-18 ENCOUNTER — Ambulatory Visit: Payer: Medicare PPO | Attending: Internal Medicine

## 2020-08-18 DIAGNOSIS — Z23 Encounter for immunization: Secondary | ICD-10-CM

## 2020-08-18 NOTE — Progress Notes (Signed)
   Covid-19 Vaccination Clinic  Name:  TOVIA KISNER    MRN: [id number] DOB: Feb 28, 1946  08/18/2020  Mr. Runkles was observed post Covid-19 immunization for 15 minutes without incident. He was provided with Vaccine Information Sheet and instruction to access the V-Safe system.   Mr. Craddock was instructed to call 911 with any severe reactions post vaccine: Marland Kitchen Difficulty breathing  . Swelling of face and throat  . A fast heartbeat  . A bad rash all over body  . Dizziness and weakness

## 2020-10-10 ENCOUNTER — Encounter: Payer: Self-pay | Admitting: Internal Medicine

## 2020-10-10 ENCOUNTER — Inpatient Hospital Stay
Admission: EM | Admit: 2020-10-10 | Discharge: 2020-10-18 | DRG: 917 | Disposition: A | Discharge status: Other Acute Inpatient Hospital | Payer: Medicare PPO | Attending: Internal Medicine | Admitting: Internal Medicine

## 2020-10-10 ENCOUNTER — Inpatient Hospital Stay: Payer: Medicare PPO

## 2020-10-10 ENCOUNTER — Other Ambulatory Visit: Payer: Self-pay

## 2020-10-10 ENCOUNTER — Emergency Department: Payer: Medicare PPO

## 2020-10-10 DIAGNOSIS — M5416 Radiculopathy, lumbar region: Secondary | ICD-10-CM | POA: Diagnosis present

## 2020-10-10 DIAGNOSIS — F1190 Opioid use, unspecified, uncomplicated: Secondary | ICD-10-CM

## 2020-10-10 DIAGNOSIS — N400 Enlarged prostate without lower urinary tract symptoms: Secondary | ICD-10-CM | POA: Diagnosis present

## 2020-10-10 DIAGNOSIS — K219 Gastro-esophageal reflux disease without esophagitis: Secondary | ICD-10-CM | POA: Diagnosis present

## 2020-10-10 DIAGNOSIS — T424X1A Poisoning by benzodiazepines, accidental (unintentional), initial encounter: Principal | ICD-10-CM

## 2020-10-10 DIAGNOSIS — F3130 Bipolar disorder, current episode depressed, mild or moderate severity, unspecified: Secondary | ICD-10-CM | POA: Diagnosis present

## 2020-10-10 DIAGNOSIS — Z981 Arthrodesis status: Secondary | ICD-10-CM

## 2020-10-10 DIAGNOSIS — F1120 Opioid dependence, uncomplicated: Secondary | ICD-10-CM | POA: Diagnosis present

## 2020-10-10 DIAGNOSIS — N182 Chronic kidney disease, stage 2 (mild): Secondary | ICD-10-CM | POA: Diagnosis present

## 2020-10-10 DIAGNOSIS — F17210 Nicotine dependence, cigarettes, uncomplicated: Secondary | ICD-10-CM | POA: Diagnosis present

## 2020-10-10 DIAGNOSIS — G9341 Metabolic encephalopathy: Secondary | ICD-10-CM | POA: Diagnosis not present

## 2020-10-10 DIAGNOSIS — Z20822 Contact with and (suspected) exposure to covid-19: Secondary | ICD-10-CM | POA: Diagnosis present

## 2020-10-10 DIAGNOSIS — N390 Urinary tract infection, site not specified: Secondary | ICD-10-CM | POA: Diagnosis not present

## 2020-10-10 DIAGNOSIS — F1010 Alcohol abuse, uncomplicated: Secondary | ICD-10-CM | POA: Diagnosis present

## 2020-10-10 DIAGNOSIS — E860 Dehydration: Secondary | ICD-10-CM | POA: Diagnosis present

## 2020-10-10 DIAGNOSIS — R3129 Other microscopic hematuria: Secondary | ICD-10-CM

## 2020-10-10 DIAGNOSIS — Z7902 Long term (current) use of antithrombotics/antiplatelets: Secondary | ICD-10-CM | POA: Diagnosis not present

## 2020-10-10 DIAGNOSIS — R7303 Prediabetes: Secondary | ICD-10-CM | POA: Diagnosis present

## 2020-10-10 DIAGNOSIS — N179 Acute kidney failure, unspecified: Secondary | ICD-10-CM | POA: Diagnosis present

## 2020-10-10 DIAGNOSIS — Z7982 Long term (current) use of aspirin: Secondary | ICD-10-CM

## 2020-10-10 DIAGNOSIS — G928 Other toxic encephalopathy: Secondary | ICD-10-CM | POA: Diagnosis present

## 2020-10-10 DIAGNOSIS — F419 Anxiety disorder, unspecified: Secondary | ICD-10-CM | POA: Diagnosis present

## 2020-10-10 DIAGNOSIS — Z5189 Encounter for other specified aftercare: Secondary | ICD-10-CM

## 2020-10-10 DIAGNOSIS — R531 Weakness: Secondary | ICD-10-CM

## 2020-10-10 DIAGNOSIS — F319 Bipolar disorder, unspecified: Secondary | ICD-10-CM | POA: Diagnosis not present

## 2020-10-10 DIAGNOSIS — Z79899 Other long term (current) drug therapy: Secondary | ICD-10-CM

## 2020-10-10 DIAGNOSIS — I10 Essential (primary) hypertension: Secondary | ICD-10-CM | POA: Diagnosis not present

## 2020-10-10 DIAGNOSIS — E785 Hyperlipidemia, unspecified: Secondary | ICD-10-CM | POA: Diagnosis present

## 2020-10-10 DIAGNOSIS — I129 Hypertensive chronic kidney disease with stage 1 through stage 4 chronic kidney disease, or unspecified chronic kidney disease: Secondary | ICD-10-CM | POA: Diagnosis present

## 2020-10-10 DIAGNOSIS — R4182 Altered mental status, unspecified: Secondary | ICD-10-CM | POA: Diagnosis not present

## 2020-10-10 DIAGNOSIS — M1990 Unspecified osteoarthritis, unspecified site: Secondary | ICD-10-CM | POA: Diagnosis present

## 2020-10-10 DIAGNOSIS — R2681 Unsteadiness on feet: Secondary | ICD-10-CM | POA: Diagnosis present

## 2020-10-10 DIAGNOSIS — N3001 Acute cystitis with hematuria: Secondary | ICD-10-CM

## 2020-10-10 DIAGNOSIS — R45851 Suicidal ideations: Secondary | ICD-10-CM | POA: Diagnosis present

## 2020-10-10 DIAGNOSIS — F32A Depression, unspecified: Secondary | ICD-10-CM | POA: Diagnosis not present

## 2020-10-10 DIAGNOSIS — T424X2D Poisoning by benzodiazepines, intentional self-harm, subsequent encounter: Secondary | ICD-10-CM | POA: Diagnosis not present

## 2020-10-10 DIAGNOSIS — Z8619 Personal history of other infectious and parasitic diseases: Secondary | ICD-10-CM

## 2020-10-10 DIAGNOSIS — Z833 Family history of diabetes mellitus: Secondary | ICD-10-CM

## 2020-10-10 DIAGNOSIS — G8929 Other chronic pain: Secondary | ICD-10-CM | POA: Diagnosis present

## 2020-10-10 DIAGNOSIS — Z139 Encounter for screening, unspecified: Secondary | ICD-10-CM

## 2020-10-10 DIAGNOSIS — Z8673 Personal history of transient ischemic attack (TIA), and cerebral infarction without residual deficits: Secondary | ICD-10-CM

## 2020-10-10 DIAGNOSIS — Z87442 Personal history of urinary calculi: Secondary | ICD-10-CM

## 2020-10-10 LAB — RESP PANEL BY RT-PCR (FLU A&B, COVID) ARPGX2
Influenza A by PCR: NEGATIVE
Influenza B by PCR: NEGATIVE
SARS Coronavirus 2 by RT PCR: NEGATIVE

## 2020-10-10 LAB — COMPREHENSIVE METABOLIC PANEL
ALT: 30 U/L (ref 0–44)
AST: 171 U/L — ABNORMAL HIGH (ref 15–41)
Albumin: 4.4 g/dL (ref 3.5–5.0)
Alkaline Phosphatase: 86 U/L (ref 38–126)
Anion gap: 16 — ABNORMAL HIGH (ref 5–15)
BUN: 25 mg/dL — ABNORMAL HIGH (ref 8–23)
CO2: 22 mmol/L (ref 22–32)
Calcium: 9.7 mg/dL (ref 8.9–10.3)
Chloride: 105 mmol/L (ref 98–111)
Creatinine, Ser: 1.3 mg/dL — ABNORMAL HIGH (ref 0.61–1.24)
GFR, Estimated: 58 mL/min — ABNORMAL LOW (ref >60)
Glucose, Bld: 133 mg/dL — ABNORMAL HIGH (ref 70–99)
Potassium: 4.2 mmol/L (ref 3.5–5.1)
Sodium: 143 mmol/L (ref 135–145)
Total Bilirubin: 1.2 mg/dL (ref 0.3–1.2)
Total Protein: 8.1 g/dL (ref 6.5–8.1)

## 2020-10-10 LAB — CBC
HCT: 44.6 % (ref 39.0–52.0)
Hemoglobin: 15 g/dL (ref 13.0–17.0)
MCH: 30.5 pg (ref 26.0–34.0)
MCHC: 33.6 g/dL (ref 30.0–36.0)
MCV: 90.7 fL (ref 80.0–100.0)
Platelets: 206 10*3/uL (ref 150–400)
RBC: 4.92 MIL/uL (ref 4.22–5.81)
RDW: 14.5 % (ref 11.5–15.5)
WBC: 15.2 10*3/uL — ABNORMAL HIGH (ref 4.0–10.5)
nRBC: 0 % (ref 0.0–0.2)

## 2020-10-10 LAB — URINE DRUG SCREEN, QUALITATIVE (ARMC ONLY)
Amphetamines, Ur Screen: NOT DETECTED
Barbiturates, Ur Screen: NOT DETECTED
Benzodiazepine, Ur Scrn: POSITIVE — AB
Cannabinoid 50 Ng, Ur ~~LOC~~: NOT DETECTED
Cocaine Metabolite,Ur ~~LOC~~: NOT DETECTED
MDMA (Ecstasy)Ur Screen: NOT DETECTED
Methadone Scn, Ur: NOT DETECTED
Opiate, Ur Screen: NOT DETECTED
Phencyclidine (PCP) Ur S: NOT DETECTED
Tricyclic, Ur Screen: POSITIVE — AB

## 2020-10-10 LAB — SALICYLATE LEVEL: Salicylate Lvl: <7 mg/dL — ABNORMAL LOW (ref 7.0–30.0)

## 2020-10-10 LAB — CBC WITH DIFFERENTIAL/PLATELET
Abs Immature Granulocytes: 0.05 10*3/uL (ref 0.00–0.07)
Basophils Absolute: 0 10*3/uL (ref 0.0–0.1)
Basophils Relative: 0 %
Eosinophils Absolute: 0 10*3/uL (ref 0.0–0.5)
Eosinophils Relative: 0 %
HCT: 45.1 % (ref 39.0–52.0)
Hemoglobin: 15.2 g/dL (ref 13.0–17.0)
Immature Granulocytes: 0 %
Lymphocytes Relative: 17 %
Lymphs Abs: 2.3 10*3/uL (ref 0.7–4.0)
MCH: 30.2 pg (ref 26.0–34.0)
MCHC: 33.7 g/dL (ref 30.0–36.0)
MCV: 89.7 fL (ref 80.0–100.0)
Monocytes Absolute: 1 10*3/uL (ref 0.1–1.0)
Monocytes Relative: 7 %
Neutro Abs: 9.9 10*3/uL — ABNORMAL HIGH (ref 1.7–7.7)
Neutrophils Relative %: 76 %
Platelets: 223 10*3/uL (ref 150–400)
RBC: 5.03 MIL/uL (ref 4.22–5.81)
RDW: 14.4 % (ref 11.5–15.5)
WBC: 13.3 10*3/uL — ABNORMAL HIGH (ref 4.0–10.5)
nRBC: 0 % (ref 0.0–0.2)

## 2020-10-10 LAB — MAGNESIUM: Magnesium: 2.3 mg/dL (ref 1.7–2.4)

## 2020-10-10 LAB — URINALYSIS, COMPLETE (UACMP) WITH MICROSCOPIC
Bilirubin Urine: NEGATIVE
Glucose, UA: NEGATIVE mg/dL
Ketones, ur: 20 mg/dL — AB
Nitrite: NEGATIVE
Protein, ur: 100 mg/dL — AB
RBC / HPF: >50 RBC/hpf — ABNORMAL HIGH (ref 0–5)
Specific Gravity, Urine: 1.026 (ref 1.005–1.030)
pH: 5 (ref 5.0–8.0)

## 2020-10-10 LAB — LACTIC ACID, PLASMA
Lactic Acid, Venous: 2.1 mmol/L (ref 0.5–1.9)
Lactic Acid, Venous: 1.8 mmol/L (ref 0.5–1.9)

## 2020-10-10 LAB — ETHANOL: Alcohol, Ethyl (B): <10 mg/dL (ref <10)

## 2020-10-10 LAB — PHOSPHORUS: Phosphorus: 3.2 mg/dL (ref 2.5–4.6)

## 2020-10-10 LAB — ACETAMINOPHEN LEVEL: Acetaminophen (Tylenol), Serum: <10 ug/mL — ABNORMAL LOW (ref 10–30)

## 2020-10-10 LAB — AMMONIA: Ammonia: 15 umol/L (ref 9–35)

## 2020-10-10 MED ORDER — LIDOCAINE HCL URETHRAL/MUCOSAL 2 % EX PRSY: 1.0000 "application " | PREFILLED_SYRINGE | Freq: Once | CUTANEOUS | Status: DC

## 2020-10-10 MED ORDER — BACLOFEN 10 MG PO TABS
10.0000 mg | ORAL_TABLET | Freq: Three times a day (TID) | ORAL | Status: DC
  Administered 2020-10-11: 10 mg via ORAL
  Filled 2020-10-10 (×6): qty 1

## 2020-10-10 MED ORDER — SODIUM CHLORIDE 0.9 % IV SOLN
1.0000 g | Freq: Once | INTRAVENOUS | Status: AC
  Administered 2020-10-10: 1 g via INTRAVENOUS
  Filled 2020-10-10: qty 10

## 2020-10-10 MED ORDER — ASPIRIN EC 81 MG PO TBEC
81.0000 mg | DELAYED_RELEASE_TABLET | Freq: Every day | ORAL | Status: DC
  Administered 2020-10-11 – 2020-10-18 (×8): 81 mg via ORAL
  Filled 2020-10-10 (×8): qty 1

## 2020-10-10 MED ORDER — ACETAMINOPHEN 650 MG RE SUPP: 650.0000 mg | Freq: Four times a day (QID) | RECTAL | Status: DC | PRN

## 2020-10-10 MED ORDER — HYDROXYZINE PAMOATE 25 MG PO CAPS
25.0000 mg | ORAL_CAPSULE | Freq: Two times a day (BID) | ORAL | Status: DC | PRN
  Filled 2020-10-10: qty 1

## 2020-10-10 MED ORDER — CLOPIDOGREL BISULFATE 75 MG PO TABS: 75.0000 mg | ORAL_TABLET | Freq: Every day | ORAL | Status: DC

## 2020-10-10 MED ORDER — SODIUM CHLORIDE 0.9 % IV SOLN: INTRAVENOUS | Status: DC

## 2020-10-10 MED ORDER — QUETIAPINE FUMARATE 25 MG PO TABS
200.0000 mg | ORAL_TABLET | Freq: Every day | ORAL | Status: DC
  Administered 2020-10-10: 200 mg via ORAL
  Filled 2020-10-10: qty 8

## 2020-10-10 MED ORDER — THIAMINE HCL 100 MG/ML IJ SOLN
500.0000 mg | Freq: Once | INTRAMUSCULAR | Status: AC
  Administered 2020-10-10: 500 mg via INTRAVENOUS
  Filled 2020-10-10: qty 6

## 2020-10-10 MED ORDER — LORAZEPAM 2 MG/ML IJ SOLN
1.0000 mg | Freq: Once | INTRAMUSCULAR | Status: AC
  Administered 2020-10-10: 1 mg via INTRAVENOUS
  Filled 2020-10-10: qty 1

## 2020-10-10 MED ORDER — LORAZEPAM 2 MG/ML IJ SOLN: 1.0000 mg | INTRAMUSCULAR | Status: DC | PRN

## 2020-10-10 MED ORDER — ATORVASTATIN CALCIUM 20 MG PO TABS
40.0000 mg | ORAL_TABLET | Freq: Every day | ORAL | Status: DC
  Administered 2020-10-11 – 2020-10-17 (×7): 40 mg via ORAL
  Filled 2020-10-10 (×7): qty 2

## 2020-10-10 MED ORDER — TRAMADOL HCL 50 MG PO TABS: 50.0000 mg | ORAL_TABLET | Freq: Two times a day (BID) | ORAL | Status: DC | PRN

## 2020-10-10 MED ORDER — FOLIC ACID 1 MG PO TABS
1.0000 mg | ORAL_TABLET | Freq: Every day | ORAL | Status: DC
  Administered 2020-10-11 – 2020-10-18 (×8): 1 mg via ORAL
  Filled 2020-10-10 (×8): qty 1

## 2020-10-10 MED ORDER — HEPARIN SODIUM (PORCINE) 5000 UNIT/ML IJ SOLN
5000.0000 [IU] | Freq: Two times a day (BID) | INTRAMUSCULAR | Status: DC
  Administered 2020-10-10 – 2020-10-11 (×3): 5000 [IU] via SUBCUTANEOUS
  Filled 2020-10-10 (×3): qty 1

## 2020-10-10 MED ORDER — ONDANSETRON HCL 4 MG PO TABS: 4.0000 mg | ORAL_TABLET | Freq: Four times a day (QID) | ORAL | Status: DC | PRN

## 2020-10-10 MED ORDER — ADULT MULTIVITAMIN W/MINERALS CH
1.0000 | ORAL_TABLET | Freq: Every day | ORAL | Status: DC
  Administered 2020-10-11 – 2020-10-18 (×8): 1 via ORAL
  Filled 2020-10-10 (×8): qty 1

## 2020-10-10 MED ORDER — TRAZODONE HCL 100 MG PO TABS
150.0000 mg | ORAL_TABLET | Freq: Every day | ORAL | Status: DC
  Administered 2020-10-10 – 2020-10-11 (×2): 150 mg via ORAL
  Filled 2020-10-10 (×2): qty 1

## 2020-10-10 MED ORDER — LABETALOL HCL 100 MG PO TABS
100.0000 mg | ORAL_TABLET | Freq: Four times a day (QID) | ORAL | Status: DC | PRN
Start: 2020-10-10 — End: 2020-10-18
  Filled 2020-10-10: qty 1

## 2020-10-10 MED ORDER — TAMSULOSIN HCL 0.4 MG PO CAPS
0.4000 mg | ORAL_CAPSULE | Freq: Every day | ORAL | Status: DC
  Administered 2020-10-11 – 2020-10-18 (×8): 0.4 mg via ORAL
  Filled 2020-10-10 (×8): qty 1

## 2020-10-10 MED ORDER — CLONIDINE HCL 0.1 MG PO TABS
0.1000 mg | ORAL_TABLET | ORAL | Status: DC
Start: 2020-10-12 — End: 2020-10-10

## 2020-10-10 MED ORDER — SODIUM CHLORIDE 0.9 % IV SOLN
1.0000 g | INTRAVENOUS | Status: DC
  Administered 2020-10-11 – 2020-10-12 (×2): 1 g via INTRAVENOUS
  Filled 2020-10-10 (×2): qty 10

## 2020-10-10 MED ORDER — TIZANIDINE HCL 4 MG PO TABS
4.0000 mg | ORAL_TABLET | Freq: Three times a day (TID) | ORAL | Status: DC | PRN
  Filled 2020-10-10: qty 1

## 2020-10-10 MED ORDER — ACETAMINOPHEN 325 MG PO TABS
650.0000 mg | ORAL_TABLET | Freq: Four times a day (QID) | ORAL | Status: DC | PRN
  Administered 2020-10-11 – 2020-10-17 (×4): 650 mg via ORAL
  Filled 2020-10-10 (×4): qty 2

## 2020-10-10 MED ORDER — ONDANSETRON HCL 4 MG/2ML IJ SOLN: 4.0000 mg | Freq: Four times a day (QID) | INTRAMUSCULAR | Status: DC | PRN

## 2020-10-10 MED ORDER — CITALOPRAM HYDROBROMIDE 10 MG PO TABS
10.0000 mg | ORAL_TABLET | Freq: Every day | ORAL | Status: DC
  Administered 2020-10-11: 10 mg via ORAL
  Filled 2020-10-10: qty 1

## 2020-10-10 MED ORDER — LORAZEPAM 1 MG PO TABS
1.0000 mg | ORAL_TABLET | ORAL | Status: DC | PRN
Start: 2020-10-10 — End: 2020-10-10

## 2020-10-10 MED ORDER — LORAZEPAM 0.5 MG PO TABS
0.5000 mg | ORAL_TABLET | Freq: Every day | ORAL | Status: DC
  Administered 2020-10-11: 0.5 mg via ORAL
  Filled 2020-10-10: qty 1

## 2020-10-10 MED ORDER — HALOPERIDOL LACTATE 5 MG/ML IJ SOLN
5.0000 mg | Freq: Four times a day (QID) | INTRAMUSCULAR | Status: DC | PRN
  Administered 2020-10-10 – 2020-10-12 (×4): 5 mg via INTRAVENOUS
  Filled 2020-10-10 (×4): qty 1

## 2020-10-10 MED ORDER — MOMETASONE FUROATE 0.1 % EX CREA
1.0000 "application " | TOPICAL_CREAM | CUTANEOUS | Status: DC
  Administered 2020-10-16: 1 via TOPICAL
  Filled 2020-10-10: qty 15

## 2020-10-10 NOTE — ED Notes (Signed)
MD Wynetta Fines made aware of lactic acid 2.1 at this time

## 2020-10-10 NOTE — ED Notes (Signed)
Patient transported to CT 

## 2020-10-10 NOTE — ED Notes (Signed)
Kim from [place] notified that patient is calm to finish exam at this time

## 2020-10-10 NOTE — ED Notes (Signed)
ultrasound at bedside unable to do because patient trying to get out of bed.

## 2020-10-10 NOTE — ED Notes (Signed)
Ultrasound at bedside

## 2020-10-10 NOTE — ED Provider Notes (Signed)
[facility] Emergency Department Provider Note   ____________________________________________   Event Date/Time   First MD Initiated Contact with Patient 10/10/20 [phone]     (approximate)  I have reviewed the triage vital signs and the nursing notes.   HISTORY  Chief Complaint Altered Mental Status   HPI [name] is a 74 y.o. male with past medical history of hypertension, GERD, stroke, chronic pain, and chronic opiate use who presents to the ED for altered mental status.  History is limited due to patient's confusion.  Per EMS, family found patient acting erratically this morning with him last being seen well sometime yesterday evening.  Family was concerned that patient could have taken too much of his tramadol or Seroquel, as he has a history of doing so.  There is also a half empty bottle of alcohol found in his kitchen on EMS arrival.  On arrival to the ED, patient is able to state his name but otherwise only states "okay" and has a hard time following commands.        Past Medical History:  Diagnosis Date  . Anxiety   . Arthritis    DDD, lumbar radiculopathy  . Bell's palsy    hx of, right side  . Bell's palsy   . Chronic back pain    followed by pain clinic at [facility]  . Depression    hx of depression  . GERD (gastroesophageal reflux disease)   . Hepatitis    hx of hepatitis C treated with Harvoni per patient  . History of kidney stones   . Hypertension   . Kidney stone    hx of  . Pre-diabetes   . Stroke ([place]) 02/2018   loss of peripheral vision and balance unsteady  . Substance abuse [facility])    heroin    Patient Active Problem List   Diagnosis Date Noted  . AMS (altered mental status) 10/10/2020  . BPH with obstruction/lower urinary tract symptoms 12/31/2019  . Ataxia 03/09/2019  . Loss of peripheral visual field, bilateral 03/09/2019  . Seizure-like activity ([place]) 03/09/2019  . Hyperlipidemia 11/25/2018  . Bilateral carotid  artery stenosis 11/25/2018  . Failed back syndrome, lumbar 07/23/2018  . Nephrolithiasis 07/08/2018  . History of CVA (cerebrovascular accident) 05/22/2018  . Visual disturbance 05/22/2018  . CVA (cerebral vascular accident) ([place]) 03/12/2018  . PAD (peripheral artery disease) ([place]) 04/20/2017  . Chronic hepatitis C without hepatic coma ([place]) 03/12/2017  . Pain and swelling of lower extremity 01/14/2016  . Chronic hip pain (Right) 01/14/2016  . Osteoarthritis of hip (Right) 01/14/2016  . Generalized anxiety disorder 01/14/2016  . Depression with anxiety 01/14/2016  . Lumbar facet syndrome 01/14/2016  . Clinical depression 01/11/2016  . BP (high blood pressure) 01/11/2016  . Insomnia, psychophysiological 01/11/2016  . Current tobacco use 01/11/2016  . Chronic low back pain (Location of Primary Source of Pain) ([place]) 01/11/2016  . Chronic pain 01/11/2016  . Lumbar spondylosis 01/11/2016  . Long term current use of opiate analgesic 01/11/2016  . Long term prescription opiate use 01/11/2016  . Opiate use (15 MME/Day) 01/11/2016  . Encounter for therapeutic drug level monitoring 01/11/2016  . Encounter for pain management planning 01/11/2016  . Failed back surgical syndrome (S/P anterior fusion at L5-S1) 01/11/2016  . Lumbar facet arthropathy 01/11/2016  . Lumbosacral radiculitis 08/16/2014  . DDD (degenerative disc disease), lumbar 08/16/2014  . Discogenic low back pain 04/27/2012    Past Surgical History:  Procedure Laterality Date  .  ANTERIOR LUMBAR FUSION  05/13/2012   Procedure: ANTERIOR LUMBAR FUSION 1 LEVEL;  Surgeon: Melina Schools, MD;  Location: [place];  Service: Orthopedics;  Laterality: N/A;  ALIF L5-S1  . CHOLECYSTECTOMY    . CYSTOSCOPY W/ RETROGRADES Left 08/04/2018   Procedure: CYSTOSCOPY WITH RETROGRADE PYELOGRAM;  Surgeon: Abbie Sons, MD;  Location: [facility];  Service: Urology;  Laterality: Left;  . CYSTOSCOPY WITH INSERTION OF UROLIFT N/A 01/18/2020    Procedure: CYSTOSCOPY WITH INSERTION OF UROLIFT;  Surgeon: Abbie Sons, MD;  Location: [facility];  Service: Urology;  Laterality: N/A;  . CYSTOSCOPY WITH STENT PLACEMENT Left 08/04/2018   Procedure: CYSTOSCOPY WITH STENT PLACEMENT;  Surgeon: Abbie Sons, MD;  Location: [facility];  Service: Urology;  Laterality: Left;  . EXTRACORPOREAL SHOCK WAVE LITHOTRIPSY  2012  . EXTRACORPOREAL SHOCK WAVE LITHOTRIPSY Left 08/20/2018   Procedure: EXTRACORPOREAL SHOCK WAVE LITHOTRIPSY (ESWL);  Surgeon: Hollice Espy, MD;  Location: [facility];  Service: Urology;  Laterality: Left; (cancelled)  . EXTRACORPOREAL SHOCK WAVE LITHOTRIPSY Left 08/27/2018   Procedure: EXTRACORPOREAL SHOCK WAVE LITHOTRIPSY (ESWL);  Surgeon: Abbie Sons, MD;  Location: [facility];  Service: Urology;  Laterality: Left;  . EXTRACORPOREAL SHOCK WAVE LITHOTRIPSY Right 10/15/2018   Procedure: EXTRACORPOREAL SHOCK WAVE LITHOTRIPSY (ESWL);  Surgeon: Billey Co, MD;  Location: [facility];  Service: Urology;  Laterality: Right;  . KIDNEY STONE SURGERY    . LUMBAR FUSION  05/13/2012    Prior to Admission medications   Medication Sig Start Date End Date Taking? Authorizing Provider  tamsulosin (FLOMAX) 0.4 MG CAPS capsule TAKE 1 CAPSULE BY MOUTH EVERY DAY 02/14/20   Stoioff, Ronda Fairly, MD  aspirin EC 81 MG tablet Take 1 tablet (81 mg total) by mouth daily. 03/13/18   Hillary Bow, MD  atorvastatin (LIPITOR) 40 MG tablet Take 1 tablet (40 mg total) by mouth daily at 6 PM. Patient taking differently: Take 40 mg by mouth daily.  03/13/18   Hillary Bow, MD  baclofen (LIORESAL) 10 MG tablet Take 10 mg by mouth 3 (three) times daily.    [provider]  citalopram (CELEXA) 10 MG tablet Take 10 mg by mouth daily. 01/06/20   [provider]  cloNIDine (CATAPRES) 0.1 MG tablet Take 0.1 mg by mouth 2 (two) times a week.  12/30/16   [provider]  clopidogrel (PLAVIX) 75 MG tablet Take 1 tablet (75 mg total) by mouth  daily. 03/13/18   Hillary Bow, MD  hydrOXYzine (VISTARIL) 25 MG capsule TAKE 1 CAPSULE (25 MG TOTAL) BY MOUTH 2 (TWO) TIMES DAILY AS NEEDED FOR SEVERE ANXIETY SYMPTOMS Patient taking differently: Take 25 mg by mouth in the morning and at bedtime.  04/15/19   Ursula Alert, MD  LORazepam (ATIVAN) 0.5 MG tablet Take 0.5 mg by mouth daily. 12/28/19   [provider]  mometasone (ELOCON) 0.1 % cream Apply 1 application topically See admin instructions. INTO EARS DAILY    [provider]  phenazopyridine (PYRIDIUM) 200 MG tablet Take 1 tablet (200 mg total) by mouth 3 (three) times daily as needed (burning with urination). 01/26/20   Stoioff, Ronda Fairly, MD  tamsulosin (FLOMAX) 0.4 MG CAPS capsule Take 1 capsule (0.4 mg total) by mouth daily. 12/23/19   Lannie Fields, PA-C  tiZANidine (ZANAFLEX) 4 MG tablet Take 4 mg by mouth in the morning, at noon, and at bedtime.  02/16/16   [provider]  traMADol (ULTRAM) 50 MG tablet Take 50 mg by  mouth 2 (two) times daily.  12/14/18   [provider]  traZODone (DESYREL) 150 MG tablet Take 1 tablet (150 mg total) by mouth at bedtime. 02/16/19   Ursula Alert, MD    Allergies Patient has no known allergies.  Family History  Problem Relation Age of Onset  . Stroke Mother   . Diabetes Mother   . Lung cancer Father   . Diabetes Father     Social History Social History   Tobacco Use  . Smoking status: Current Every Day Smoker    Packs/day: 0.25    Years: 53.00    Pack years: 13.25    Types: Cigarettes  . Smokeless tobacco: Never Used  Vaping Use  . Vaping Use: Never used  Substance Use Topics  . Alcohol use: Never    Alcohol/week: 0.0 standard drinks  . Drug use: Not Currently    Types: Heroin, Cocaine, Marijuana    Comment: 1960's    Review of Systems Unable to obtain secondary to altered mental status  ____________________________________________   PHYSICAL EXAM:  VITAL SIGNS: ED Triage Vitals  Enc  Vitals Group     BP      Pulse      Resp      Temp      Temp src      SpO2      Weight      Height      Head Circumference      Peak Flow      Pain Score      Pain Loc      Pain Edu?      Excl. in [place]?     Constitutional: Alert and oriented to person, but not place or time. Eyes: Conjunctivae are normal.  Pupils equal but sluggishly responsive to light bilaterally. Head: Atraumatic. Nose: No congestion/rhinnorhea. Mouth/Throat: Mucous membranes are moist. Neck: Normal ROM Cardiovascular: Normal rate, regular rhythm. Grossly normal heart sounds. Respiratory: Normal respiratory effort.  No retractions. Lungs CTAB. Gastrointestinal: Soft and nontender. No distention. Genitourinary: deferred Musculoskeletal: No lower extremity tenderness nor edema. Neurologic:  Normal speech and language. No gross focal neurologic deficits are appreciated, moving all extremities equally. Skin:  Skin is warm, dry and intact. No rash noted. Psychiatric: Unable to assess.  ____________________________________________   LABS (all labs ordered are listed, but only abnormal results are displayed)  Labs Reviewed  CBC WITH DIFFERENTIAL/PLATELET - Abnormal; Notable for the following components:      Result Value   WBC 13.3 (*)    Neutro Abs 9.9 (*)    All other components within normal limits  COMPREHENSIVE METABOLIC PANEL - Abnormal; Notable for the following components:   Glucose, Bld 133 (*)    BUN 25 (*)    Creatinine, Ser 1.30 (*)    AST 171 (*)    GFR, Estimated 58 (*)    Anion gap 16 (*)    All other components within normal limits  SALICYLATE LEVEL - Abnormal; Notable for the following components:   Salicylate Lvl [id number] (*)    All other components within normal limits  ACETAMINOPHEN LEVEL - Abnormal; Notable for the following components:   Acetaminophen (Tylenol), Serum <10 (*)    All other components within normal limits  URINALYSIS, COMPLETE (UACMP) WITH MICROSCOPIC - Abnormal;  Notable for the following components:   Color, Urine AMBER (*)    APPearance HAZY (*)    Hgb urine dipstick LARGE (*)    Ketones, ur 20 (*)  Protein, ur 100 (*)    Leukocytes,Ua SMALL (*)    RBC / HPF >50 (*)    Bacteria, UA MANY (*)    All other components within normal limits  URINE DRUG SCREEN, QUALITATIVE ([facility] ONLY) - Abnormal; Notable for the following components:   Tricyclic, Ur Screen POSITIVE (*)    Benzodiazepine, Ur Scrn POSITIVE (*)    All other components within normal limits  URINE CULTURE  RESP PANEL BY RT-PCR (FLU A&B, COVID) ARPGX2  ETHANOL  AMMONIA  MAGNESIUM  PHOSPHORUS  CBC   ____________________________________________  EKG  ED ECG REPORT I, Blake Divine, the attending physician, personally viewed and interpreted this ECG.   Date: 10/10/2020  EKG Time: 10:01  Rate: 101  Rhythm: sinus tachycardia  Axis: Normal  Intervals:none  ST&T Change: Nonspecific T wave changes diffusely   PROCEDURES  Procedure(s) performed (including Critical Care):  Procedures   ____________________________________________   INITIAL IMPRESSION / ASSESSMENT AND PLAN / ED COURSE       74 year old male with past medical history of hypertension, stroke, GERD, chronic pain, and chronic opiate use who presents to the ED for altered mental status noted this morning by family after his last known well time was yesterday evening.  Patient is disoriented, only oriented to person, but does not appear to have any focal neurologic deficits as he is moving all extremities equally and attempting to get up out of bed.  We will further assess with CT head, EKG, tox labs, and UA.  Differential includes alcohol intoxication, opiate use, serotonin syndrome, stroke, infectious process, electrolyte abnormality.  CT head and C-spine reviewed by me with no obvious hemorrhage or fracture, are negative for acute process per radiology.  Patient did require small dose of Ativan for agitation  to facilitate imaging, he is now sleeping comfortably with no respiratory depression.  Lab work is unremarkable but UA does show signs of infection.  We will send for culture and treat with Rocephin.  Patient does have increased AST relative to ALT and we will treat with thiamine given concern for alcohol abuse and Warnicke's encephalopathy.  Overmedication, UTI, and work his encephalopathy could all be contributing to his change in mental status.  Case discussed with hospitalist for admission.      ____________________________________________   FINAL CLINICAL IMPRESSION(S) / ED DIAGNOSES  Final diagnoses:  Altered mental status, unspecified altered mental status type  Opiate use  Lower urinary tract infectious disease     ED Discharge Orders    None       Note:  This document was prepared using Dragon voice recognition software and may include unintentional dictation errors.   Blake Divine, MD 10/10/20 [phone]

## 2020-10-10 NOTE — H&P (Addendum)
History and Physical    [name] UKG:[id number] DOB: 09/20/1946 DOA: 10/10/2020  PCP: Tracie Harrier, MD (Confirm with patient/family/NH records and if not entered, this has to be entered at [facility] point of entry) Patient coming from: Home  I have personally briefly reviewed patient's old medical records in [place]  Chief Complaint: AMS  HPI: [name] is a 74 y.o. 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  Patient confused and lethargic, most history was provided by patient's ex-wife at bedside.  Ex-wife reported patient had recent lithotripsy to left kidney stones in summer.  About 3 to 4 weeks ago, patient started to feel more left flank pain, " I think the kidney stone problem has come back", and since then, the patient has been taking more tramadol with no numbers.    Since yesterday afternoon patient has been more confused compared to his baseline, and patient been more lethargic and sleepy this morning with unsteady gait.  Labs reported no fever, but appears to be more left flank pain/back pain.  ED Course: Patient remained lethargic as compared to at home, vital signs no fever, BP maintained.  CT no acute findings, lab work showed elevated leukocytosis creatinine 1.3 compared to 0.8.  Review of Systems: Unable to perform patient confused and lethargic.  Past Medical History:  Diagnosis Date  . Anxiety   . Arthritis    DDD, lumbar radiculopathy  . Bell's palsy    hx of, right side  . Bell's palsy   . Chronic back pain    followed by pain clinic at [facility]  . Depression    hx of depression  . GERD (gastroesophageal reflux disease)   . Hepatitis    hx of hepatitis C treated with Harvoni per patient  . History of kidney  stones   . Hypertension   . Kidney stone    hx of  . Pre-diabetes   . Stroke ([place]) 02/2018   loss of peripheral vision and balance unsteady  . Substance abuse [facility])    heroin    Past Surgical History:  Procedure Laterality Date  . ANTERIOR LUMBAR FUSION  05/13/2012   Procedure: ANTERIOR LUMBAR FUSION 1 LEVEL;  Surgeon: Melina Schools, MD;  Location: [facility];  Service: Orthopedics;  Laterality: N/A;  ALIF L5-S1  . CHOLECYSTECTOMY    . CYSTOSCOPY W/ RETROGRADES Left 08/04/2018   Procedure: CYSTOSCOPY WITH RETROGRADE PYELOGRAM;  Surgeon: Abbie Sons, MD;  Location: [facility];  Service: Urology;  Laterality: Left;  . CYSTOSCOPY WITH INSERTION OF UROLIFT N/A 01/18/2020   Procedure: CYSTOSCOPY WITH INSERTION OF UROLIFT;  Surgeon: Abbie Sons, MD;  Location: [facility];  Service: Urology;  Laterality: N/A;  . CYSTOSCOPY WITH STENT PLACEMENT Left 08/04/2018   Procedure: CYSTOSCOPY WITH STENT PLACEMENT;  Surgeon: Abbie Sons, MD;  Location: [facility];  Service: Urology;  Laterality: Left;  . EXTRACORPOREAL SHOCK WAVE LITHOTRIPSY  2012  . EXTRACORPOREAL SHOCK WAVE LITHOTRIPSY Left 08/20/2018   Procedure: EXTRACORPOREAL SHOCK WAVE LITHOTRIPSY (ESWL);  Surgeon: Hollice Espy, MD;  Location: [facility];  Service: Urology;  Laterality: Left; (cancelled)  . EXTRACORPOREAL SHOCK WAVE LITHOTRIPSY Left 08/27/2018   Procedure: EXTRACORPOREAL SHOCK WAVE LITHOTRIPSY (ESWL);  Surgeon: Abbie Sons, MD;  Location: [facility];  Service: Urology;  Laterality: Left;  . EXTRACORPOREAL SHOCK WAVE LITHOTRIPSY Right 10/15/2018   Procedure: EXTRACORPOREAL SHOCK WAVE LITHOTRIPSY (ESWL);  Surgeon: Sondra ComeSninsky, Brian C, MD;  Location: [facility];  Service: Urology;  Laterality: Right;  . KIDNEY STONE SURGERY    . LUMBAR FUSION  05/13/2012     reports that he has been smoking cigarettes. He has a 13.25 pack-year smoking history. He has never used smokeless tobacco. He reports previous drug use. Drugs: Heroin, Cocaine,  and Marijuana. He reports that he does not drink alcohol.  No Known Allergies  Family History  Problem Relation Age of Onset  . Stroke Mother   . Diabetes Mother   . Lung cancer Father   . Diabetes Father      Prior to Admission medications   Medication Sig Start Date End Date Taking? Authorizing Provider  tamsulosin (FLOMAX) 0.4 MG CAPS capsule TAKE 1 CAPSULE BY MOUTH EVERY DAY 02/14/20   Stoioff, Verna CzechScott C, MD  aspirin EC 81 MG tablet Take 1 tablet (81 mg total) by mouth daily. 03/13/18   Milagros LollSudini, Srikar, MD  atorvastatin (LIPITOR) 40 MG tablet Take 1 tablet (40 mg total) by mouth daily at 6 PM. Patient taking differently: Take 40 mg by mouth daily.  03/13/18   Milagros LollSudini, Srikar, MD  baclofen (LIORESAL) 10 MG tablet Take 10 mg by mouth 3 (three) times daily.    [provider]  citalopram (CELEXA) 10 MG tablet Take 10 mg by mouth daily. 01/06/20   [provider]  cloNIDine (CATAPRES) 0.1 MG tablet Take 0.1 mg by mouth 2 (two) times a week.  12/30/16   [provider]  clopidogrel (PLAVIX) 75 MG tablet Take 1 tablet (75 mg total) by mouth daily. 03/13/18   Milagros LollSudini, Srikar, MD  hydrOXYzine (VISTARIL) 25 MG capsule TAKE 1 CAPSULE (25 MG TOTAL) BY MOUTH 2 (TWO) TIMES DAILY AS NEEDED FOR SEVERE ANXIETY SYMPTOMS Patient taking differently: Take 25 mg by mouth in the morning and at bedtime.  04/15/19   Jomarie LongsEappen, Saramma, MD  LORazepam (ATIVAN) 0.5 MG tablet Take 0.5 mg by mouth daily. 12/28/19   [provider]  mometasone (ELOCON) 0.1 % cream Apply 1 application topically See admin instructions. INTO EARS DAILY    [provider]  phenazopyridine (PYRIDIUM) 200 MG tablet Take 1 tablet (200 mg total) by mouth 3 (three) times daily as needed (burning with urination). 01/26/20   Stoioff, Verna CzechScott C, MD  tamsulosin (FLOMAX) 0.4 MG CAPS capsule Take 1 capsule (0.4 mg total) by mouth daily. 12/23/19   Orvil FeilWoods, Jaclyn M, PA-C  tiZANidine (ZANAFLEX) 4 MG tablet Take 4 mg by mouth  in the morning, at noon, and at bedtime.  02/16/16   [provider]  traMADol (ULTRAM) 50 MG tablet Take 50 mg by mouth 2 (two) times daily.  12/14/18   [provider]  traZODone (DESYREL) 150 MG tablet Take 1 tablet (150 mg total) by mouth at bedtime. 02/16/19   Jomarie LongsEappen, Saramma, MD    Physical Exam: Vitals:   10/10/20 1005 10/10/20 1230  BP: (!) 148/71 120/72  Pulse: (!) 102 74  Resp:  11  Temp: 98.7 F (37.1 C)   TempSrc: Temporal   SpO2: 95% 97%  Weight: 65 kg   Height: 5\' 2"  (1.575 m)     Constitutional: NAD, calm, comfortable Vitals:   10/10/20 1005 10/10/20 1230  BP: (!) 148/71 120/72  Pulse: (!) 102 74  Resp:  11  Temp: 98.7 F (37.1 C)  TempSrc: Temporal   SpO2: 95% 97%  Weight: 65 kg   Height: 5\' 2"  (1.575 m)    Eyes: Pupils are small and sluggish to light, lids and conjunctivae normal ENMT: Mucous membranes are dry. Posterior pharynx clear of any exudate or lesions.Normal dentition.  Neck: normal, supple, no masses, no thyromegaly Respiratory: clear to auscultation bilaterally, no wheezing, no crackles. Normal respiratory effort. No accessory muscle use.  Cardiovascular: Regular rate and rhythm, no murmurs / rubs / gallops. No extremity edema. 2+ pedal pulses. No carotid bruits.  Abdomen: no tenderness, no masses palpated. No hepatosplenomegaly. Bowel sounds positive.  Musculoskeletal: no clubbing / cyanosis. No joint deformity upper and lower extremities. Good ROM, no contractures. Normal muscle tone.  Skin: no rashes, lesions, ulcers. No induration Neurologic: Moving all limbs, arousable, very lethargic  psychiatric: Arousable, oriented to himself, confused about time and place, following simple commands.    Labs on Admission: I have personally reviewed following labs and imaging studies  CBC: Recent Labs  Lab 10/10/20 1009  WBC 13.3*  NEUTROABS 9.9*  HGB 15.2  HCT 45.1  MCV 89.7  PLT [id number]   Basic Metabolic Panel: Recent Labs   Lab 10/10/20 1009  NA 143  K 4.2  CL 105  CO2 22  GLUCOSE 133*  BUN 25*  CREATININE 1.30*  CALCIUM 9.7   GFR: Estimated Creatinine Clearance: 39.7 mL/min (A) (by C-G formula based on SCr of 1.3 mg/dL (H)). Liver Function Tests: Recent Labs  Lab 10/10/20 1009  AST 171*  ALT 30  ALKPHOS 86  BILITOT 1.2  PROT 8.1  ALBUMIN 4.4   No results for input(s): LIPASE, AMYLASE in the last 168 hours. Recent Labs  Lab 10/10/20 1120  AMMONIA 15   Coagulation Profile: No results for input(s): INR, PROTIME in the last 168 hours. Cardiac Enzymes: No results for input(s): CKTOTAL, CKMB, CKMBINDEX, TROPONINI in the last 168 hours. BNP (last 3 results) No results for input(s): PROBNP in the last 8760 hours. HbA1C: No results for input(s): HGBA1C in the last 72 hours. CBG: No results for input(s): GLUCAP in the last 168 hours. Lipid Profile: No results for input(s): CHOL, HDL, LDLCALC, TRIG, CHOLHDL, LDLDIRECT in the last 72 hours. Thyroid Function Tests: No results for input(s): TSH, T4TOTAL, FREET4, T3FREE, THYROIDAB in the last 72 hours. Anemia Panel: No results for input(s): VITAMINB12, FOLATE, FERRITIN, TIBC, IRON, RETICCTPCT in the last 72 hours. Urine analysis:    Component Value Date/Time   COLORURINE AMBER (A) 10/10/2020 1121   APPEARANCEUR HAZY (A) 10/10/2020 1121   APPEARANCEUR Hazy (A) 01/25/2020 1305   LABSPEC 1.026 10/10/2020 1121   PHURINE 5.0 10/10/2020 1121   GLUCOSEU NEGATIVE 10/10/2020 1121   HGBUR LARGE (A) 10/10/2020 1121   BILIRUBINUR NEGATIVE 10/10/2020 1121   BILIRUBINUR Negative 01/25/2020 1305   KETONESUR 20 (A) 10/10/2020 1121   PROTEINUR 100 (A) 10/10/2020 1121   NITRITE NEGATIVE 10/10/2020 1121   LEUKOCYTESUR SMALL (A) 10/10/2020 1121    Radiological Exams on Admission: CT Head Wo Contrast  Result Date: 10/10/2020 CLINICAL DATA:  Fall, altered mental status EXAM: CT HEAD WITHOUT CONTRAST CT CERVICAL SPINE WITHOUT CONTRAST TECHNIQUE:  Multidetector CT imaging of the head and cervical spine was performed following the standard protocol without intravenous contrast. Multiplanar CT image reconstructions of the cervical spine were also generated. COMPARISON:  CT head dated 04/24/2020 FINDINGS: CT HEAD FINDINGS Brain: No evidence of acute infarction, hemorrhage, hydrocephalus, extra-axial collection or mass lesion/mass effect. Old right PCA distribution infarct.  Mild subcortical white matter and periventricular small vessel ischemic changes. Vascular: Mild intracranial atherosclerosis. Skull: Normal. Negative for fracture or focal lesion. Sinuses/Orbits: The visualized paranasal sinuses are essentially clear. The mastoid air cells are unopacified. Other: None. CT CERVICAL SPINE FINDINGS Alignment: Normal cervical lordosis. Skull base and vertebrae: No acute fracture. No primary bone lesion or focal pathologic process. Soft tissues and spinal canal: No prevertebral fluid or swelling. No visible canal hematoma. Disc levels:  Mild degenerative changes at C5-6. Upper chest: Visualized lung apices are clear. Other: Visualized thyroid is unremarkable. IMPRESSION: No evidence of acute intracranial abnormality. Old right PCA distribution infarct. Mild small vessel ischemic changes. No evidence of traumatic injury to the cervical spine. Mild degenerative changes. Electronically Signed   By: Julian Hy M.D.   On: 10/10/2020 10:56   CT Cervical Spine Wo Contrast  Result Date: 10/10/2020 CLINICAL DATA:  Fall, altered mental status EXAM: CT HEAD WITHOUT CONTRAST CT CERVICAL SPINE WITHOUT CONTRAST TECHNIQUE: Multidetector CT imaging of the head and cervical spine was performed following the standard protocol without intravenous contrast. Multiplanar CT image reconstructions of the cervical spine were also generated. COMPARISON:  CT head dated 04/24/2020 FINDINGS: CT HEAD FINDINGS Brain: No evidence of acute infarction, hemorrhage, hydrocephalus,  extra-axial collection or mass lesion/mass effect. Old right PCA distribution infarct. Mild subcortical white matter and periventricular small vessel ischemic changes. Vascular: Mild intracranial atherosclerosis. Skull: Normal. Negative for fracture or focal lesion. Sinuses/Orbits: The visualized paranasal sinuses are essentially clear. The mastoid air cells are unopacified. Other: None. CT CERVICAL SPINE FINDINGS Alignment: Normal cervical lordosis. Skull base and vertebrae: No acute fracture. No primary bone lesion or focal pathologic process. Soft tissues and spinal canal: No prevertebral fluid or swelling. No visible canal hematoma. Disc levels:  Mild degenerative changes at C5-6. Upper chest: Visualized lung apices are clear. Other: Visualized thyroid is unremarkable. IMPRESSION: No evidence of acute intracranial abnormality. Old right PCA distribution infarct. Mild small vessel ischemic changes. No evidence of traumatic injury to the cervical spine. Mild degenerative changes. Electronically Signed   By: Julian Hy M.D.   On: 10/10/2020 10:56    EKG: Independently reviewed.  Borderline sinus tachycardia, no acute ST changes  Assessment/Plan Active Problems:   AMS (altered mental status)  (please populate well all problems here in Problem List. (For example, if patient is on BP meds at home and you resume or decide to hold them, it is a problem that needs to be her. Same for CAD, COPD, HLD and so on)  Acute metabolic encephalopathy -Agreed with ED physician evaluated this is probably most likely multifactorial process, First Impression is polypharmacy as urine toxicology showed opioids and benzos, and according to ex-wife patient been taking more than usual amount of tramadol.  Physical exam: Patient has signs of opioid overdose, however he has been sufficiently protected his airway, arousable, will hold Narcan for now.  Admit to telemetry for close monitoring. -Cut down Tramadol and Ativan to  PRN. -Also has a UTI which is likely a complicated UTI with history of multiple kidney stones and BPH. No hx of resistant organism, continue Rocephin total of 7 days. -Wife reported patient not drinking Alcohol at home.  Complicated UTI with microscopic hematuria - with worsening of kidney function, concern about recurrent kidney stone, will order kidney ultrasound. -Rocephin.  BPH -Continue home meds.  HTN -Hold Lasix and start hydration for 24 hours then re-evaluate -PRN hydralazine  AKI on CKD stage II -Hydration then re-evaluate.  Anxiety/Depression -  Continue SSRI, change Ativan to PRN. -May need psy at later point if mentation not improving.   DVT prophylaxis: Heparin subQ Code Status: Full Code Family Communication: ex-wife at bedside Disposition Plan: Likely will need more than 2 midnight hospital stay for work-up and treatment for AMS. Consults called: None Admission status: Tele admit   Lequita Halt MD [facility] Pager [phone]  10/10/2020, 12:39 PM

## 2020-10-10 NOTE — ED Notes (Signed)
Rn Vanessa on floor messaged to see if she had any questions.

## 2020-10-10 NOTE — ED Notes (Signed)
Pt continues to be confused and attempting to get out of bed. Family member at bedside.

## 2020-10-10 NOTE — ED Notes (Signed)
Pt incontinent of urine. New sheets and brief placed

## 2020-10-10 NOTE — ED Notes (Signed)
Patient confused  taking off leads to monitor and pulse oximetry.

## 2020-10-10 NOTE — ED Triage Notes (Addendum)
BIB EMS with altered mental status. Per ems family think patient is abusing his tramadol and Seroquel. Per ems Patient had a fall at home today as well.

## 2020-10-11 ENCOUNTER — Encounter: Payer: Self-pay | Admitting: Internal Medicine

## 2020-10-11 ENCOUNTER — Inpatient Hospital Stay: Payer: Medicare PPO

## 2020-10-11 ENCOUNTER — Other Ambulatory Visit: Payer: Self-pay

## 2020-10-11 DIAGNOSIS — G9341 Metabolic encephalopathy: Secondary | ICD-10-CM | POA: Diagnosis not present

## 2020-10-11 DIAGNOSIS — N400 Enlarged prostate without lower urinary tract symptoms: Secondary | ICD-10-CM | POA: Diagnosis not present

## 2020-10-11 DIAGNOSIS — N3001 Acute cystitis with hematuria: Secondary | ICD-10-CM | POA: Diagnosis not present

## 2020-10-11 DIAGNOSIS — I10 Essential (primary) hypertension: Secondary | ICD-10-CM

## 2020-10-11 DIAGNOSIS — R531 Weakness: Secondary | ICD-10-CM

## 2020-10-11 DIAGNOSIS — N179 Acute kidney failure, unspecified: Secondary | ICD-10-CM | POA: Diagnosis not present

## 2020-10-11 DIAGNOSIS — F419 Anxiety disorder, unspecified: Secondary | ICD-10-CM

## 2020-10-11 DIAGNOSIS — F32A Depression, unspecified: Secondary | ICD-10-CM

## 2020-10-11 LAB — CBC
HCT: 38.6 % — ABNORMAL LOW (ref 39.0–52.0)
Hemoglobin: 13 g/dL (ref 13.0–17.0)
MCH: 30.5 pg (ref 26.0–34.0)
MCHC: 33.7 g/dL (ref 30.0–36.0)
MCV: 90.6 fL (ref 80.0–100.0)
Platelets: 177 10*3/uL (ref 150–400)
RBC: 4.26 MIL/uL (ref 4.22–5.81)
RDW: 14.7 % (ref 11.5–15.5)
WBC: 9.9 10*3/uL (ref 4.0–10.5)
nRBC: 0 % (ref 0.0–0.2)

## 2020-10-11 LAB — BASIC METABOLIC PANEL
Anion gap: 11 (ref 5–15)
BUN: 21 mg/dL (ref 8–23)
CO2: 22 mmol/L (ref 22–32)
Calcium: 8.4 mg/dL — ABNORMAL LOW (ref 8.9–10.3)
Chloride: 105 mmol/L (ref 98–111)
Creatinine, Ser: 0.96 mg/dL (ref 0.61–1.24)
GFR, Estimated: >60 mL/min (ref >60)
Glucose, Bld: 115 mg/dL — ABNORMAL HIGH (ref 70–99)
Potassium: 3.3 mmol/L — ABNORMAL LOW (ref 3.5–5.1)
Sodium: 138 mmol/L (ref 135–145)

## 2020-10-11 LAB — URINE CULTURE: Culture: NO GROWTH

## 2020-10-11 MED ORDER — POTASSIUM CHLORIDE 20 MEQ PO PACK
40.0000 meq | PACK | Freq: Once | ORAL | Status: AC
  Administered 2020-10-11: 40 meq via ORAL
  Filled 2020-10-11: qty 2

## 2020-10-11 MED ORDER — ENSURE ENLIVE PO LIQD
237.0000 mL | Freq: Two times a day (BID) | ORAL | Status: DC
  Administered 2020-10-11 – 2020-10-18 (×10): 237 mL via ORAL

## 2020-10-11 MED ORDER — QUETIAPINE FUMARATE 25 MG PO TABS
50.0000 mg | ORAL_TABLET | Freq: Every day | ORAL | Status: DC
  Administered 2020-10-11: 50 mg via ORAL
  Filled 2020-10-11: qty 2

## 2020-10-11 MED ORDER — ENOXAPARIN SODIUM 40 MG/0.4ML ~~LOC~~ SOLN
40.0000 mg | SUBCUTANEOUS | Status: DC
  Administered 2020-10-11 – 2020-10-17 (×7): 40 mg via SUBCUTANEOUS
  Filled 2020-10-11 (×7): qty 0.4

## 2020-10-11 MED ORDER — CITALOPRAM HYDROBROMIDE 20 MG PO TABS
20.0000 mg | ORAL_TABLET | Freq: Every day | ORAL | Status: DC
Start: 2020-10-12 — End: 2020-10-18
  Administered 2020-10-12 – 2020-10-18 (×7): 20 mg via ORAL
  Filled 2020-10-11 (×9): qty 1

## 2020-10-11 MED ORDER — SODIUM CHLORIDE 0.9 % IV SOLN: INTRAVENOUS | Status: DC

## 2020-10-11 NOTE — Evaluation (Signed)
Physical Therapy Evaluation Patient Details Name: NORTON BIVINS MRN: [id number] DOB: 01-Sep-1946 Today's Date: 10/11/2020   History of Present Illness  presented to ER secondary to AMS, decreased responsiveness, unsteady gait, L flank/back pain; admitted for management of acute metabolic encephalopathy and complicated UTI.  Clinical Impression  Nursing at bedside for ADL/hygiene with patient.  Patient maintains eyes closed, but does verbally respond to therapist (though speech generally non-sensical and unrelated to question/conversation at times).  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 Does not visually acknowledge or track therapist in any visual field throughout session. Oriented to self only; unable to consistently follow verbal commands, requiring hand-over-hand assist to initiate all on-command movement throughout session.  Strength grossly 3- to 4-/5 throughout; no obvious focal weakness appreciated.  Currently requiring mod assist +2 for bed mobility; min assist +2 for sit/stand, basic transfers and gait (20') with bilat HHA.  Demonstrates very short, choppy steps; limited balance reactions, limited insight/safety awareness.  Hand-over-hand to guide pathway/direction throughout gait trial. Would benefit from skilled PT to address above deficits and promote optimal return to PLOF.; recommend transition to STR upon discharge from acute hospitalization.     Follow Up Recommendations SNF    Equipment Recommendations       Recommendations for Other Services       Precautions / Restrictions Precautions Precautions: Fall Restrictions Weight Bearing Restrictions: No      Mobility  Bed Mobility Overal bed mobility: Needs Assistance Bed Mobility: Supine to Sit;Sit to Supine     Supine to sit: Mod assist Sit to supine: Mod assist;Max assist   General bed mobility comments: limited due to difficulty with command-following versus strength/mobiltiy deficit     Transfers Overall transfer level: Needs assistance Equipment used: 2 person hand held assist Transfers: Sit to/from Stand Sit to Stand: Min assist;+2 physical assistance         General transfer comment: hand-over-hand to initiate and guide movement at all times; generally unsteady, limited balance reactions, safety awareness/insight  Ambulation/Gait Ambulation/Gait assistance: Min assist;+2 physical assistance Gait Distance (Feet): 20 Feet Assistive device: 2 person hand held assist       General Gait Details: very short, choppy steps; limited balance reactions, limited insight/safety awareness.  Hand-over-hand to guide pathway/direction throughout gait trial  Stairs            Wheelchair Mobility    Modified Rankin (Stroke Patients Only)       Balance Overall balance assessment: Needs assistance Sitting-balance support: No upper extremity supported;Feet supported Sitting balance-Leahy Scale: Good     Standing balance support: Bilateral upper extremity supported Standing balance-Leahy Scale: Fair                               Pertinent Vitals/Pain Pain Assessment: Faces Faces Pain Scale: No hurt    Home Living Family/patient expects to be discharged to:: Private residence Living Arrangements: Spouse/significant other Available Help at Discharge: Family Type of Home: House Home Access: Level entry     Home Layout: One level   Additional Comments: Patient poor historian; unable to provide accurate information.  Will verify with family as available.    Prior Function           Comments: Patient poor historian; unable to provide accurate information.  Will verify with family as available.     Hand Dominance        Extremity/Trunk Assessment   Upper  Extremity Assessment Upper Extremity Assessment:  (unable to participate with formal MMT due to cognitive deficits; act assist for on-command movement of bilat UEs, grossly at least  3-/5 throughout)    Lower Extremity Assessment Lower Extremity Assessment:  (unable to participate with formal MMT due to cognitive deficits; act assist for on-command movement of bilat LEs, grossly at least 4-/5 throughout.  Supports body weight without buckling)       Communication   Communication:  (garbled and intelligible, non-sensical at at times)  Cognition Arousal/Alertness: Awake/alert Behavior During Therapy: Flat affect Overall Cognitive Status: No family/caregiver present to determine baseline cognitive functioning                                 General Comments: oriented to self, provides/responds to name as "[place]", but unable to follow commands, answer questions appropriately otherwise.  Requires hand-over-hand assist for all isolated, on command movement.  Generally restless throughout all extremities.      General Comments      Exercises Other Exercises Other Exercises: Unsupported sitting, close sup for balance; mod/max assist for drinking (unable to hold/manipulate cup), max/dep assist for eating (small bites of applesauce). Other Exercises: Sit/stand x3 with bilat HHA, min/mod assist to initiate/guide movement   Assessment/Plan    PT Assessment Patient needs continued PT services  PT Problem List Decreased strength;Decreased range of motion;Decreased activity tolerance;Decreased balance;Decreased mobility;Decreased cognition;Decreased knowledge of use of DME;Decreased safety awareness;Decreased knowledge of precautions       PT Treatment Interventions DME instruction;Gait training;Functional mobility training;Therapeutic activities;Therapeutic exercise;Stair training;Balance training;Patient/family education    PT Goals (Current goals can be found in the Care Plan section)  Acute Rehab PT Goals PT Goal Formulation: Patient unable to participate in goal setting Time For Goal Achievement: 10/25/20 Potential to Achieve Goals: Fair    Frequency  Min 2X/week   Barriers to discharge        Co-evaluation               AM-PAC PT "6 Clicks" Mobility  Outcome Measure Help needed turning from your back to your side while in a flat bed without using bedrails?: A Lot Help needed moving from lying on your back to sitting on the side of a flat bed without using bedrails?: A Lot Help needed moving to and from a bed to a chair (including a wheelchair)?: A Lot Help needed standing up from a chair using your arms (e.g., wheelchair or bedside chair)?: A Lot Help needed to walk in hospital room?: A Lot Help needed climbing 3-5 steps with a railing? : A Lot 6 Click Score: 12    End of Session Equipment Utilized During Treatment: Gait belt Activity Tolerance: Patient tolerated treatment well Patient left: in bed;with call bell/phone within reach;with bed alarm set Nurse Communication: Mobility status PT Visit Diagnosis: Muscle weakness (generalized) (M62.81);Difficulty in walking, not elsewhere classified (R26.2)    Time: [id number] PT Time Calculation (min) (ACUTE ONLY): 23 min   Charges:   PT Evaluation $PT Eval Moderate Complexity: 1 Mod PT Treatments $Therapeutic Activity: 8-22 mins      Kylan Liberati H. Owens Shark, PT, DPT, NCS 10/11/20, 11:34 AM [phone]

## 2020-10-11 NOTE — Progress Notes (Signed)
Initial Nutrition Assessment  DOCUMENTATION CODES:   Not applicable  INTERVENTION:  Ensure Enlive po BID, each supplement provides 350 kcal and 20 grams of protein  Continue MVI with minerals daily  NUTRITION DIAGNOSIS:   Increased nutrient needs related to acute illness (complicated UTI) as evidenced by estimated needs.    GOAL:   Patient will meet greater than or equal to 90% of their needs    MONITOR:   Labs,I & O's,Supplement acceptance,PO intake,Weight trends  REASON FOR ASSESSMENT:   Malnutrition Screening Tool    ASSESSMENT:  RD working remotely.  74 year old male with history significant of R PCA stroke with left vision loss, recurrent left kidney/ureter stone s/p lithotripsy (2021), anxiety, depression, BPH s/p TURP, OA of lumbar spine s/p fusions, chronic back pain, narcotic dependence, HLD who presented with increased confusion from baseline over the past day. Family reports patient has recently been taking more tramadol due to worsening left flank pain that started about 3-4 weeks ago. Patient admitted for acute metabolic encephalopathy likely due to polypharmacy and complicated UTI.  RD attempted to contact pt via phone, however no answer. Pt noted confused, taking off leads and attempting to get out of bed yesterday. No documented meals at this time for review. Suspect decreased po given AMS, will order Ensure supplement to aid with meeting needs. Will plan to complete exam and obtain history at follow-up.  Per chart, weights have been stable over the last year.   Per notes: -CT head and C-spine negative for acute process -UA with signs of infection, cultures sent -lab work unremarkable -increased AST relative to ALT, start thiamine given concern for EtOH abuse  Medications reviewed and include: Baclofen, Celexa, Folic acid, Ativan, MVI, Seroquel, Trazodone, Rocephin  IVF: NaCl @ 100 ml/hr  Labs: K 3.3 (L), Mg 2.3 (WNL), P 3.2 (WNL)  NUTRITION -  FOCUSED PHYSICAL EXAM:  Unable to complete at this time, RD working remotely.  Diet Order:   Diet Order            Diet Heart Room service appropriate? Yes; Fluid consistency: Thin  Diet effective now                 EDUCATION NEEDS:   No education needs have been identified at this time  Skin:  Skin Assessment: Reviewed RN Assessment  Last BM:  unknown  Height:   Ht Readings from Last 1 Encounters:  10/10/20 5\' 2"  (1.575 m)    Weight:   Wt Readings from Last 1 Encounters:  10/10/20 65 kg    BMI:  Body mass index is 26.21 kg/m.  Estimated Nutritional Needs:   Kcal:  1700-1900  Protein:  75-90  Fluid:  >1.6 L/day   Lajuan Lines, RD, LDN Clinical Nutrition After Hours/Weekend Pager # in [place]

## 2020-10-11 NOTE — Progress Notes (Addendum)
Patient ID: [name], male   DOB: [birth year], 74 y.o.   MRN: [id number] [facility] PROGRESS NOTE  [name] [id number] DOB: 10-23-1945 DOA: 10/10/2020 PCP: Tracie Harrier, MD  HPI/Subjective: Patient kept his eyes closed most of the time I was talking with him.  Answered some simple questions but did not elaborate much.  Very impulsive tried to stand up a couple times with physical therapy in the room.  It took a few tries to swallow a pill and we had to give him applesauce in order to swallow a pill that was still in his mouth.  Objective: Vitals:   10/11/20 0839 10/11/20 1140  BP: (!) 140/57 (!) 107/54  Pulse: 68 88  Resp: 18 18  Temp: 97.8 F (36.6 C) 98.8 F (37.1 C)  SpO2:  100%    Intake/Output Summary (Last 24 hours) at 10/11/2020 1323 Last data filed at 10/11/2020 0616 Gross per 24 hour  Intake 237.04 ml  Output --  Net 237.04 ml   Filed Weights   10/10/20 1005  Weight: 65 kg    ROS: Review of Systems  Respiratory: Negative for shortness of breath.   Cardiovascular: Negative for chest pain.  Gastrointestinal: Negative for abdominal pain, nausea and vomiting.   Exam: Physical Exam HENT:     Head: Normocephalic.     Mouth/Throat:     Pharynx: No oropharyngeal exudate.  Eyes:     General: Lids are normal.     Conjunctiva/sclera: Conjunctivae normal.     Pupils: Pupils are equal, round, and reactive to light.  Cardiovascular:     Rate and Rhythm: Normal rate and regular rhythm.     Heart sounds: Normal heart sounds, S1 normal and S2 normal.  Pulmonary:     Breath sounds: No decreased breath sounds, wheezing, rhonchi or rales.  Abdominal:     Palpations: Abdomen is soft.     Tenderness: There is no abdominal tenderness.  Musculoskeletal:     Right lower leg: No swelling.     Left lower leg: No swelling.  Skin:    General: Skin is warm.     Findings: No rash.  Neurological:     Mental Status: He is alert.     Comments: Answers  some yes or no questions.       Data Reviewed: Basic Metabolic Panel: Recent Labs  Lab 10/10/20 1009 10/10/20 1248 10/11/20 0702  NA 143  --  138  K 4.2  --  3.3*  CL 105  --  105  CO2 22  --  22  GLUCOSE 133*  --  115*  BUN 25*  --  21  CREATININE 1.30*  --  0.96  CALCIUM 9.7  --  8.4*  MG  --  2.3  --   PHOS  --  3.2  --    Liver Function Tests: Recent Labs  Lab 10/10/20 1009  AST 171*  ALT 30  ALKPHOS 86  BILITOT 1.2  PROT 8.1  ALBUMIN 4.4    Recent Labs  Lab 10/10/20 1120  AMMONIA 15   CBC: Recent Labs  Lab 10/10/20 1009 10/10/20 1248 10/11/20 0702  WBC 13.3* 15.2* 9.9  NEUTROABS 9.9*  --   --   HGB 15.2 15.0 13.0  HCT 45.1 44.6 38.6*  MCV 89.7 90.7 90.6  PLT 223 206 177     Recent Results (from the past 240 hour(s))  Urine culture     Status: None   Collection Time:  10/10/20 11:21 AM   Specimen: Urine, Random  Result Value Ref Range Status   Specimen Description   Final    URINE, RANDOM Performed at [facility], [address], [place], [place] [address]    Special Requests   Final    NONE Performed at [facility], [address] [place], [place] [address]    Culture   Final    NO GROWTH Performed at [facility], [place] [address], [place], [place] [address]    Report Status 10/11/2020 FINAL  Final  Resp Panel by RT-PCR (Flu A&B, Covid) Nasopharyngeal Swab     Status: None   Collection Time: 10/10/20 12:48 PM   Specimen: Nasopharyngeal Swab; Nasopharyngeal(NP) swabs in vial transport medium  Result Value Ref Range Status   SARS Coronavirus 2 by RT PCR NEGATIVE NEGATIVE Final    Comment: (NOTE) SARS-CoV-2 target nucleic acids are NOT DETECTED.  The SARS-CoV-2 RNA is generally detectable in upper respiratory specimens during the acute phase of infection. The lowest concentration of SARS-CoV-2 viral copies this assay can detect is 138 copies/mL. A negative result does not preclude SARS-Cov-2 infection  and should not be used as the sole basis for treatment or other patient management decisions. A negative result may occur with  improper specimen collection/handling, submission of specimen other than nasopharyngeal swab, presence of viral mutation(s) within the areas targeted by this assay, and inadequate number of viral copies(<138 copies/mL). A negative result must be combined with clinical observations, patient history, and epidemiological information. The expected result is Negative.  Fact Sheet for Patients:  EntrepreneurPulse.com.au  Fact Sheet for Healthcare Providers:  IncredibleEmployment.be  This test is no t yet approved or cleared by the [place] FDA and  has been authorized for detection and/or diagnosis of SARS-CoV-2 by FDA under an Emergency Use Authorization (EUA). This EUA will remain  in effect (meaning this test can be used) for the duration of the COVID-19 declaration under Section 564(b)(1) of the Act, 21 U.S.C.section 360bbb-3(b)(1), unless the authorization is terminated  or revoked sooner.       Influenza A by PCR NEGATIVE NEGATIVE Final   Influenza B by PCR NEGATIVE NEGATIVE Final    Comment: (NOTE) The Xpert Xpress SARS-CoV-2/FLU/RSV plus assay is intended as an aid in the diagnosis of influenza from Nasopharyngeal swab specimens and should not be used as a sole basis for treatment. Nasal washings and aspirates are unacceptable for Xpert Xpress SARS-CoV-2/FLU/RSV testing.  Fact Sheet for Patients: EntrepreneurPulse.com.au  Fact Sheet for Healthcare Providers: IncredibleEmployment.be  This test is not yet approved or cleared by the [place] FDA and has been authorized for detection and/or diagnosis of SARS-CoV-2 by FDA under an Emergency Use Authorization (EUA). This EUA will remain in effect (meaning this test can be used) for the duration of the COVID-19 declaration  under Section 564(b)(1) of the Act, 21 U.S.C. section 360bbb-3(b)(1), unless the authorization is terminated or revoked.  Performed at [facility], [address] [place], [place] [address]      Studies: CT Head Wo Contrast  Result Date: 10/10/2020 CLINICAL DATA:  Fall, altered mental status EXAM: CT HEAD WITHOUT CONTRAST CT CERVICAL SPINE WITHOUT CONTRAST TECHNIQUE: Multidetector CT imaging of the head and cervical spine was performed following the standard protocol without intravenous contrast. Multiplanar CT image reconstructions of the cervical spine were also generated. COMPARISON:  CT head dated 04/24/2020 FINDINGS: CT HEAD FINDINGS Brain: No evidence of acute infarction, hemorrhage, hydrocephalus, extra-axial  collection or mass lesion/mass effect. Old right PCA distribution infarct. Mild subcortical white matter and periventricular small vessel ischemic changes. Vascular: Mild intracranial atherosclerosis. Skull: Normal. Negative for fracture or focal lesion. Sinuses/Orbits: The visualized paranasal sinuses are essentially clear. The mastoid air cells are unopacified. Other: None. CT CERVICAL SPINE FINDINGS Alignment: Normal cervical lordosis. Skull base and vertebrae: No acute fracture. No primary bone lesion or focal pathologic process. Soft tissues and spinal canal: No prevertebral fluid or swelling. No visible canal hematoma. Disc levels:  Mild degenerative changes at C5-6. Upper chest: Visualized lung apices are clear. Other: Visualized thyroid is unremarkable. IMPRESSION: No evidence of acute intracranial abnormality. Old right PCA distribution infarct. Mild small vessel ischemic changes. No evidence of traumatic injury to the cervical spine. Mild degenerative changes. Electronically Signed   By: Julian Hy M.D.   On: 10/10/2020 10:56   CT Cervical Spine Wo Contrast  Result Date: 10/10/2020 CLINICAL DATA:  Fall, altered mental status EXAM: CT HEAD WITHOUT CONTRAST CT  CERVICAL SPINE WITHOUT CONTRAST TECHNIQUE: Multidetector CT imaging of the head and cervical spine was performed following the standard protocol without intravenous contrast. Multiplanar CT image reconstructions of the cervical spine were also generated. COMPARISON:  CT head dated 04/24/2020 FINDINGS: CT HEAD FINDINGS Brain: No evidence of acute infarction, hemorrhage, hydrocephalus, extra-axial collection or mass lesion/mass effect. Old right PCA distribution infarct. Mild subcortical white matter and periventricular small vessel ischemic changes. Vascular: Mild intracranial atherosclerosis. Skull: Normal. Negative for fracture or focal lesion. Sinuses/Orbits: The visualized paranasal sinuses are essentially clear. The mastoid air cells are unopacified. Other: None. CT CERVICAL SPINE FINDINGS Alignment: Normal cervical lordosis. Skull base and vertebrae: No acute fracture. No primary bone lesion or focal pathologic process. Soft tissues and spinal canal: No prevertebral fluid or swelling. No visible canal hematoma. Disc levels:  Mild degenerative changes at C5-6. Upper chest: Visualized lung apices are clear. Other: Visualized thyroid is unremarkable. IMPRESSION: No evidence of acute intracranial abnormality. Old right PCA distribution infarct. Mild small vessel ischemic changes. No evidence of traumatic injury to the cervical spine. Mild degenerative changes. Electronically Signed   By: Julian Hy M.D.   On: 10/10/2020 10:56   US RENAL  Result Date: 10/10/2020 CLINICAL DATA:  Acute renal insufficiency EXAM: RENAL / URINARY TRACT ULTRASOUND COMPLETE COMPARISON:  12/23/2019 FINDINGS: Right Kidney: Renal measurements: 8.4 x 4.3 by 4.4 cm = volume: 81.8 mL. Echogenicity is within normal limits. Nonobstructing calculi measuring up to 7 mm. No hydronephrosis. Left Kidney: Renal measurements: 9.9 x 4.9 x 4.5 cm = volume: 113.0 mL. Nonobstructing 8 mm calculus lower pole. Echogenicity is within normal limits.  No hydronephrosis. Bladder: Appears normal for degree of bladder distention. Other: None. IMPRESSION: 1. Bilateral nonobstructing renal calculi. Otherwise unremarkable renal ultrasound. Electronically Signed   By: Randa Ngo M.D.   On: 10/10/2020 17:07    Scheduled Meds: . aspirin EC  81 mg Oral Daily  . atorvastatin  40 mg Oral Daily  . [START ON 10/12/2020] citalopram  20 mg Oral Daily  . feeding supplement  237 mL Oral BID BM  . folic acid  1 mg Oral Daily  . heparin  5,000 Units Subcutaneous Q12H  . lidocaine  1 application Urethral Once  . LORazepam  0.5 mg Oral Daily  . mometasone  1 application Topical See admin instructions  . multivitamin with minerals  1 tablet Oral Daily  . QUEtiapine  50 mg Oral QHS  . tamsulosin  0.4 mg Oral Daily  .  traZODone  150 mg Oral QHS   Continuous Infusions: . cefTRIAXone (ROCEPHIN)  IV 1 g (10/11/20 1114)    Assessment/Plan:  1. Acute metabolic encephalopathy due to dehydration and possible UTI.  Tried to contact family about his baseline mental status.  Hold medications that can cause altered mental status including Wellbutrin muscle relaxers and tramadol. 2. Acute kidney injury.  Creatinine improved from 1.3 down to 0.96 with IV fluids. 3. Possible UTI with hematuria.  Follow-up urine culture on empiric Rocephin. 4. BPH on Flomax 5. Essential hypertension continue to monitor off medications 6. Anxiety depression on Celexa. 7. Weakness.  Physical therapy recommends rehab    Code Status:     Code Status Orders  (From admission, onward)         Start     Ordered   10/10/20 1238  Full code  Continuous        10/10/20 1239        Code Status History    Date Active Date Inactive Code Status Order ID Comments User Context   03/12/2018 2017 03/13/2018 2110 Full Code [id number]  Henreitta Leber, MD Inpatient   07/28/2012 1508 07/29/2012 1324 Full Code [id number]  Carlisle Cater, PA ED   Advance Care Planning Activity     Family  Communication: Left message for primary contact on the phone Disposition Plan: Status is: Inpatient  Dispo: The patient is from: Home              Anticipated d/c is to: Rehab              Anticipated d/c date is: Likely will need a few days here in the hospital              Patient currently being treated for acute metabolic encephalopathy.  Mental status was to be better prior to disposition  Antibiotics:  Rocephin  Time spent: 28 minutes  Miche[employer]  [facility]   Addendum: I did speak with the patient's daughter late afternoon on 10/11/2020.  They brought him in with altered mental status.  This is not his baseline mental status.  Normally he is able to walk and communicate fine.  I ordered an MRI of the brain which came back no acute findings.  Could be secondary to polypharmacy.  Trying to taper some medications. Dr Loletha Grayer

## 2020-10-12 DIAGNOSIS — E785 Hyperlipidemia, unspecified: Secondary | ICD-10-CM

## 2020-10-12 DIAGNOSIS — I10 Essential (primary) hypertension: Secondary | ICD-10-CM | POA: Diagnosis not present

## 2020-10-12 DIAGNOSIS — N179 Acute kidney failure, unspecified: Secondary | ICD-10-CM | POA: Diagnosis not present

## 2020-10-12 DIAGNOSIS — N400 Enlarged prostate without lower urinary tract symptoms: Secondary | ICD-10-CM | POA: Diagnosis not present

## 2020-10-12 DIAGNOSIS — G9341 Metabolic encephalopathy: Secondary | ICD-10-CM | POA: Diagnosis not present

## 2020-10-12 LAB — BASIC METABOLIC PANEL
Anion gap: 9 (ref 5–15)
BUN: 17 mg/dL (ref 8–23)
CO2: 25 mmol/L (ref 22–32)
Calcium: 9.3 mg/dL (ref 8.9–10.3)
Chloride: 110 mmol/L (ref 98–111)
Creatinine, Ser: 0.75 mg/dL (ref 0.61–1.24)
GFR, Estimated: >60 mL/min (ref >60)
Glucose, Bld: 128 mg/dL — ABNORMAL HIGH (ref 70–99)
Potassium: 3.8 mmol/L (ref 3.5–5.1)
Sodium: 144 mmol/L (ref 135–145)

## 2020-10-12 LAB — CBC
HCT: 42.1 % (ref 39.0–52.0)
Hemoglobin: 14.1 g/dL (ref 13.0–17.0)
MCH: 30.4 pg (ref 26.0–34.0)
MCHC: 33.5 g/dL (ref 30.0–36.0)
MCV: 90.7 fL (ref 80.0–100.0)
Platelets: 184 10*3/uL (ref 150–400)
RBC: 4.64 MIL/uL (ref 4.22–5.81)
RDW: 14.6 % (ref 11.5–15.5)
WBC: 7.3 10*3/uL (ref 4.0–10.5)
nRBC: 0 % (ref 0.0–0.2)

## 2020-10-12 MED ORDER — TRAZODONE HCL 50 MG PO TABS
150.0000 mg | ORAL_TABLET | Freq: Every day | ORAL | Status: DC
  Administered 2020-10-12 – 2020-10-17 (×6): 150 mg via ORAL
  Filled 2020-10-12 (×6): qty 1

## 2020-10-12 MED ORDER — MELATONIN 5 MG PO TABS
2.5000 mg | ORAL_TABLET | Freq: Every day | ORAL | Status: DC
  Administered 2020-10-12 – 2020-10-17 (×6): 2.5 mg via ORAL
  Filled 2020-10-12 (×6): qty 1

## 2020-10-12 MED ORDER — QUETIAPINE FUMARATE 100 MG PO TABS
100.0000 mg | ORAL_TABLET | Freq: Every day | ORAL | Status: DC
  Administered 2020-10-12: 100 mg via ORAL
  Filled 2020-10-12: qty 4
  Filled 2020-10-12 (×2): qty 1

## 2020-10-12 MED ORDER — LORAZEPAM 0.5 MG PO TABS
0.2500 mg | ORAL_TABLET | Freq: Every day | ORAL | Status: DC
  Administered 2020-10-12 – 2020-10-18 (×7): 0.25 mg via ORAL
  Filled 2020-10-12 (×7): qty 1

## 2020-10-12 MED ORDER — QUETIAPINE FUMARATE 25 MG PO TABS
25.0000 mg | ORAL_TABLET | Freq: Every day | ORAL | Status: DC
Start: 2020-10-12 — End: 2020-10-12

## 2020-10-12 MED ORDER — FLUCONAZOLE 100 MG PO TABS
100.0000 mg | ORAL_TABLET | Freq: Every day | ORAL | Status: DC
  Administered 2020-10-12 – 2020-10-16 (×5): 100 mg via ORAL
  Filled 2020-10-12 (×5): qty 1

## 2020-10-12 MED ORDER — TRAZODONE HCL 100 MG PO TABS: 100.0000 mg | ORAL_TABLET | Freq: Every day | ORAL | Status: DC

## 2020-10-12 MED ORDER — NICOTINE 21 MG/24HR TD PT24
21.0000 mg | MEDICATED_PATCH | TRANSDERMAL | Status: DC
  Administered 2020-10-12 – 2020-10-17 (×6): 21 mg via TRANSDERMAL
  Filled 2020-10-12 (×6): qty 1

## 2020-10-12 NOTE — NC FL2 (Signed)
[place] LEVEL OF CARE SCREENING TOOL     IDENTIFICATION  Patient Name: [name] Birthdate: 09/08/1946 Sex: male Admission Date (Current Location): 10/10/2020  [place] and [place] Number:  Engineering geologist and Address:  [facility], [address], [place], [place] [address]      Provider Number: [id number]  Attending Physician Name and Address:  Loletha Grayer, MD  Relative Name and Phone Number:  Macyn Remmert [phone]    Current Level of Care:   Recommended Level of Care: [place] Prior Approval Number:    Date Approved/Denied:   PASRR Number:    Discharge Plan: SNF    Current Diagnoses: Patient Active Problem List   Diagnosis Date Noted  . Acute metabolic encephalopathy   . Acute cystitis with hematuria   . Weakness   . AMS (altered mental status) 10/10/2020  . AKI (acute kidney injury) ([place])   . Benign prostatic hyperplasia without lower urinary tract symptoms 12/31/2019  . Ataxia 03/09/2019  . Loss of peripheral visual field, bilateral 03/09/2019  . Seizure-like activity ([place]) 03/09/2019  . Hyperlipidemia 11/25/2018  . Bilateral carotid artery stenosis 11/25/2018  . Failed back syndrome, lumbar 07/23/2018  . Nephrolithiasis 07/08/2018  . History of CVA (cerebrovascular accident) 05/22/2018  . Visual disturbance 05/22/2018  . CVA (cerebral vascular accident) ([place]) 03/12/2018  . PAD (peripheral artery disease) ([place]) 04/20/2017  . Chronic hepatitis C without hepatic coma ([place]) 03/12/2017  . Pain and swelling of lower extremity 01/14/2016  . Chronic hip pain (Right) 01/14/2016  . Osteoarthritis of hip (Right) 01/14/2016  . Generalized anxiety disorder 01/14/2016  . Anxiety and depression 01/14/2016  . Lumbar facet syndrome 01/14/2016  . Clinical depression 01/11/2016  . Essential hypertension 01/11/2016  . Insomnia, psychophysiological 01/11/2016  . Current tobacco use 01/11/2016  .  Chronic low back pain (Location of Primary Source of Pain) ([place]) 01/11/2016  . Chronic pain 01/11/2016  . Lumbar spondylosis 01/11/2016  . Long term current use of opiate analgesic 01/11/2016  . Long term prescription opiate use 01/11/2016  . Opiate use (15 MME/Day) 01/11/2016  . Encounter for therapeutic drug level monitoring 01/11/2016  . Encounter for pain management planning 01/11/2016  . Failed back surgical syndrome (S/P anterior fusion at L5-S1) 01/11/2016  . Lumbar facet arthropathy 01/11/2016  . Lumbosacral radiculitis 08/16/2014  . DDD (degenerative disc disease), lumbar 08/16/2014  . Discogenic low back pain 04/27/2012    Orientation RESPIRATION BLADDER Height & Weight     Self,Place  Normal External catheter Weight: 65 kg Height:  5\' 2"  (157.5 cm)  BEHAVIORAL SYMPTOMS/MOOD NEUROLOGICAL BOWEL NUTRITION STATUS      Continent Diet (Heart Healthy)  AMBULATORY STATUS COMMUNICATION OF NEEDS Skin   Extensive Assist Verbally Normal                       Personal Care Assistance Level of Assistance  Bathing,Feeding,Dressing Bathing Assistance: Maximum assistance Feeding assistance: Limited assistance Dressing Assistance: Maximum assistance     Functional Limitations Info             SPECIAL CARE FACTORS FREQUENCY  PT (By licensed PT),OT (By licensed OT)                    Contractures Contractures Info: Not present    Additional Factors Info  Code Status,Allergies Code Status Info: Full Allergies Info: No known allergies  Current Medications (10/12/2020):  This is the current hospital active medication list Current Facility-Administered Medications  Medication Dose Route Frequency Provider Last Rate Last Admin  . acetaminophen (TYLENOL) tablet 650 mg  650 mg Oral Q6H PRN Wynetta Fines T, MD   650 mg at 10/11/20 2156   Or  . acetaminophen (TYLENOL) suppository 650 mg  650 mg Rectal Q6H PRN Wynetta Fines T, MD      . aspirin EC tablet  81 mg  81 mg Oral Daily Wynetta Fines T, MD   81 mg at 10/12/20 [id number]  . atorvastatin (LIPITOR) tablet 40 mg  40 mg Oral Daily Wynetta Fines T, MD   40 mg at 10/12/20 [id number]  . citalopram (CELEXA) tablet 20 mg  20 mg Oral Daily Loletha Grayer, MD   20 mg at 10/12/20 [id number]  . enoxaparin (LOVENOX) injection 40 mg  40 mg Subcutaneous Q24H Loletha Grayer, MD   40 mg at 10/11/20 2156  . feeding supplement (ENSURE ENLIVE / ENSURE PLUS) liquid 237 mL  237 mL Oral BID BM Loletha Grayer, MD   237 mL at 10/12/20 1310  . fluconazole (DIFLUCAN) tablet 100 mg  100 mg Oral Daily Loletha Grayer, MD   100 mg at 10/12/20 1041  . folic acid (FOLVITE) tablet 1 mg  1 mg Oral Daily Wynetta Fines T, MD   1 mg at 10/12/20 [id number]  . haloperidol lactate (HALDOL) injection 5 mg  5 mg Intravenous Q6H PRN Wynetta Fines T, MD   5 mg at 10/12/20 1042  . labetalol (NORMODYNE) tablet 100 mg  100 mg Oral Q6H PRN Wynetta Fines T, MD      . lidocaine (XYLOCAINE) 2 % jelly 1 application  1 application Urethral Once Wynetta Fines T, MD      . LORazepam (ATIVAN) tablet 0.25 mg  0.25 mg Oral Daily Loletha Grayer, MD   0.25 mg at 10/12/20 [id number]  . melatonin tablet 2.5 mg  2.5 mg Oral QHS [place], Richard, MD      . mometasone (ELOCON) 0.1 % cream 1 application  1 application Topical See admin instructions Wynetta Fines T, MD      . multivitamin with minerals tablet 1 tablet  1 tablet Oral Daily Lequita Halt, MD   1 tablet at 10/12/20 [phone]  . ondansetron (ZOFRAN) tablet 4 mg  4 mg Oral Q6H PRN Wynetta Fines T, MD       Or  . ondansetron [facility]) injection 4 mg  4 mg Intravenous Q6H PRN Wynetta Fines T, MD      . QUEtiapine (SEROQUEL) tablet 100 mg  100 mg Oral QHS [place], Richard, MD      . tamsulosin [facility]) capsule 0.4 mg  0.4 mg Oral Daily Wynetta Fines T, MD   0.4 mg at 10/12/20 [id number]  . traZODone (DESYREL) tablet 150 mg  150 mg Oral QHS Loletha Grayer, MD         Discharge Medications: Please see discharge summary for a list of discharge  medications.  Relevant Imaging Results:  Relevant Lab Results:   Additional Information SS# [id number]  Shelbie Ammons, RN

## 2020-10-12 NOTE — Progress Notes (Signed)
Pt  crawled out of bed and went on his knees at the foot of his bed. Pt was insisting that he wanted to go home and he stated that he wanted to use the commode. Pt was offered the bedside commode to which he responded by saying that he did not really want to use the toilet. Pt was encouraged to get off the floor which he did with minimal prompting and encouragement. Pt returned to bed and was ambulating freely. Pt denied having been injured. Family was contacted and voicemail to call [facility] back was left at [phone]. Pt was encouraged to be mindful of his environment to minimize risk of injury. Pt verbalized understanding. Pt appears calm and comfortable at time of note writing. Pt displays poor insight into his illness. Medical Provider E [place] notified via securechat. Will continue to monitor for safety.

## 2020-10-12 NOTE — TOC Initial Note (Addendum)
Transition of Care [facility]) - Initial/Assessment Note    Patient Details  Name: [name] MRN: [id number] Date of Birth: [birth year]  Transition of Care [facility]) CM/SW Contact:    Shelbie Ammons, RN Phone Number: 10/12/2020, 1:42 PM  Clinical Narrative:     RNCM reached out to patient's daughter [name] for assessment. Patient lives with his ex-wife but daughter is close by and assists in care. Discussed patient's current recommendations for skilled nursing at discharge. Discussed current recommendations as well as ongoing medical issues with daughter and answered questions as able. After some discussion of differences between SNF and home with home health daughter is agreeable to a SNF work up. She does not have initial preference as to facilities and is ok with a search of all local facilities.     RNCM submitted for PASSR and will fax additional info, completed FL2 and started bed search.            Expected Discharge Plan: Skilled Nursing Facility Barriers to Discharge: Continued Medical Work up   Patient Goals and CMS Choice        Expected Discharge Plan and Services Expected Discharge Plan: [place] arrangements for the past 2 months: Single Family Home                                      Prior Living Arrangements/Services Living arrangements for the past 2 months: Single Family Home Lives with:: Adult Children Patient language and need for interpreter reviewed:: Yes Do you feel safe going back to the place where you live?: Yes      Need for Family Participation in Patient Care: Yes (Comment) Care giver support system in place?: Yes (comment)   Criminal Activity/Legal Involvement Pertinent to Current Situation/Hospitalization: No - Comment as needed  Activities of Daily Living Home Assistive Devices/Equipment: None ADL Screening (condition at time of admission) Patient's cognitive ability adequate to safely complete daily  activities?: No Is the patient deaf or have difficulty hearing?: No Does the patient have difficulty seeing, even when wearing glasses/contacts?: Yes Does the patient have difficulty concentrating, remembering, or making decisions?: Yes Patient able to express need for assistance with ADLs?: No Does the patient have difficulty dressing or bathing?: Yes Independently performs ADLs?: No Does the patient have difficulty walking or climbing stairs?: Yes Weakness of Legs: Both Weakness of Arms/Hands: None  Permission Sought/Granted                  Emotional Assessment Appearance:: Appears stated age         Psych Involvement: No (comment)  Admission diagnosis:  Lower urinary tract infectious disease [N39.0] Opiate use [F11.90] Microscopic hematuria [R31.29] AKI (acute kidney injury) ([place]) [N17.9] Altered mental status, unspecified altered mental status type [R41.82] AMS (altered mental status) [R41.82] Patient Active Problem List   Diagnosis Date Noted  . Acute metabolic encephalopathy   . Acute cystitis with hematuria   . Weakness   . AMS (altered mental status) 10/10/2020  . AKI (acute kidney injury) ([place])   . Benign prostatic hyperplasia without lower urinary tract symptoms 12/31/2019  . Ataxia 03/09/2019  . Loss of peripheral visual field, bilateral 03/09/2019  . Seizure-like activity ([place]) 03/09/2019  . Hyperlipidemia 11/25/2018  . Bilateral carotid artery stenosis 11/25/2018  . Failed back syndrome, lumbar 07/23/2018  . Nephrolithiasis 07/08/2018  . History  of CVA (cerebrovascular accident) 05/22/2018  . Visual disturbance 05/22/2018  . CVA (cerebral vascular accident) ([place]) 03/12/2018  . PAD (peripheral artery disease) ([place]) 04/20/2017  . Chronic hepatitis C without hepatic coma ([place]) 03/12/2017  . Pain and swelling of lower extremity 01/14/2016  . Chronic hip pain (Right) 01/14/2016  . Osteoarthritis of hip (Right) 01/14/2016  . Generalized anxiety disorder  01/14/2016  . Anxiety and depression 01/14/2016  . Lumbar facet syndrome 01/14/2016  . Clinical depression 01/11/2016  . Essential hypertension 01/11/2016  . Insomnia, psychophysiological 01/11/2016  . Current tobacco use 01/11/2016  . Chronic low back pain (Location of Primary Source of Pain) ([place]) 01/11/2016  . Chronic pain 01/11/2016  . Lumbar spondylosis 01/11/2016  . Long term current use of opiate analgesic 01/11/2016  . Long term prescription opiate use 01/11/2016  . Opiate use (15 MME/Day) 01/11/2016  . Encounter for therapeutic drug level monitoring 01/11/2016  . Encounter for pain management planning 01/11/2016  . Failed back surgical syndrome (S/P anterior fusion at L5-S1) 01/11/2016  . Lumbar facet arthropathy 01/11/2016  . Lumbosacral radiculitis 08/16/2014  . DDD (degenerative disc disease), lumbar 08/16/2014  . Discogenic low back pain 04/27/2012   PCP:  Tracie Harrier, MD Pharmacy:   [id number] - [place], [place] [address] [place] [address] Phone: [phone] Fax: [phone]     Social Determinants of Health (SDOH) Interventions    Readmission Risk Interventions No flowsheet data found.

## 2020-10-12 NOTE — Progress Notes (Signed)
Patient ID: [name], male   DOB: 08/14/1946, 74 y.o.   MRN: [id number] [place] [id number] DOB: [birth year] DOA: 10/10/2020 PCP: Tracie Harrier, MD  HPI/Subjective: Patient did not sleep last night.  Nursing staff noted that patient crawled out of bed and was on his knees at the foot of the bed.  Patient did not offer any complaints.  Answers a few yes or no questions.  When I came back into the room he introduced me to his wife.  Objective: Vitals:   10/12/20 0911 10/12/20 1218  BP: (!) 151/81 (!) 166/72  Pulse: 73 76  Resp: 16 14  Temp: 98.1 F (36.7 C) 98.2 F (36.8 C)  SpO2: 100% 100%    Intake/Output Summary (Last 24 hours) at 10/12/2020 1351 Last data filed at 10/12/2020 0338 Gross per 24 hour  Intake 340 ml  Output --  Net 340 ml   Filed Weights   10/10/20 1005  Weight: 65 kg    ROS: Review of Systems  Respiratory: Negative for shortness of breath.   Cardiovascular: Negative for chest pain.  Gastrointestinal: Negative for abdominal pain.   Exam: Physical Exam HENT:     Head: Normocephalic.     Mouth/Throat:     Pharynx: No oropharyngeal exudate.  Eyes:     General: Lids are normal.     Conjunctiva/sclera: Conjunctivae normal.  Cardiovascular:     Rate and Rhythm: Normal rate and regular rhythm.     Heart sounds: Normal heart sounds, S1 normal and S2 normal.  Pulmonary:     Breath sounds: No decreased breath sounds, wheezing, rhonchi or rales.  Abdominal:     Palpations: Abdomen is soft.     Tenderness: There is no abdominal tenderness.  Musculoskeletal:     Right lower leg: No swelling.     Left lower leg: No swelling.  Skin:    General: Skin is warm.     Findings: No rash.  Neurological:     Mental Status: He is alert.     Comments: Answers some yes or no questions.  Able to straight leg raise bilaterally.  Moves all of his extremities to command.       Data Reviewed: Basic Metabolic  Panel: Recent Labs  Lab 10/10/20 1009 10/10/20 1248 10/11/20 0702 10/12/20 0523  NA 143  --  138 144  K 4.2  --  3.3* 3.8  CL 105  --  105 110  CO2 22  --  22 25  GLUCOSE 133*  --  115* 128*  BUN 25*  --  21 17  CREATININE 1.30*  --  0.96 0.75  CALCIUM 9.7  --  8.4* 9.3  MG  --  2.3  --   --   PHOS  --  3.2  --   --    Liver Function Tests: Recent Labs  Lab 10/10/20 1009  AST 171*  ALT 30  ALKPHOS 86  BILITOT 1.2  PROT 8.1  ALBUMIN 4.4    Recent Labs  Lab 10/10/20 1120  AMMONIA 15   CBC: Recent Labs  Lab 10/10/20 1009 10/10/20 1248 10/11/20 0702 10/12/20 0523  WBC 13.3* 15.2* 9.9 7.3  NEUTROABS 9.9*  --   --   --   HGB 15.2 15.0 13.0 14.1  HCT 45.1 44.6 38.6* 42.1  MCV 89.7 90.7 90.6 90.7  PLT 223 206 177 184     Recent Results (from the past 240 hour(s))  Urine culture     Status: None   Collection Time: 10/10/20 11:21 AM   Specimen: Urine, Random  Result Value Ref Range Status   Specimen Description   Final    URINE, RANDOM Performed at [facility], [address] [place], [place] [address]    Special Requests   Final    NONE Performed at [facility], [address], [place], [place] [address]    Culture   Final    NO GROWTH Performed at [facility], [place] [address] [place], [place] [address]    Report Status 10/11/2020 FINAL  Final  Resp Panel by RT-PCR (Flu A&B, Covid) Nasopharyngeal Swab     Status: None   Collection Time: 10/10/20 12:48 PM   Specimen: Nasopharyngeal Swab; Nasopharyngeal(NP) swabs in vial transport medium  Result Value Ref Range Status   SARS Coronavirus 2 by RT PCR NEGATIVE NEGATIVE Final    Comment: (NOTE) SARS-CoV-2 target nucleic acids are NOT DETECTED.  The SARS-CoV-2 RNA is generally detectable in upper respiratory specimens during the acute phase of infection. The lowest concentration of SARS-CoV-2 viral copies this assay can detect is 138 copies/mL. A negative result does not  preclude SARS-Cov-2 infection and should not be used as the sole basis for treatment or other patient management decisions. A negative result may occur with  improper specimen collection/handling, submission of specimen other than nasopharyngeal swab, presence of viral mutation(s) within the areas targeted by this assay, and inadequate number of viral copies(<138 copies/mL). A negative result must be combined with clinical observations, patient history, and epidemiological information. The expected result is Negative.  Fact Sheet for Patients:  EntrepreneurPulse.com.au  Fact Sheet for Healthcare Providers:  IncredibleEmployment.be  This test is no t yet approved or cleared by the [place] FDA and  has been authorized for detection and/or diagnosis of SARS-CoV-2 by FDA under an Emergency Use Authorization (EUA). This EUA will remain  in effect (meaning this test can be used) for the duration of the COVID-19 declaration under Section 564(b)(1) of the Act, 21 U.S.C.section 360bbb-3(b)(1), unless the authorization is terminated  or revoked sooner.       Influenza A by PCR NEGATIVE NEGATIVE Final   Influenza B by PCR NEGATIVE NEGATIVE Final    Comment: (NOTE) The Xpert Xpress SARS-CoV-2/FLU/RSV plus assay is intended as an aid in the diagnosis of influenza from Nasopharyngeal swab specimens and should not be used as a sole basis for treatment. Nasal washings and aspirates are unacceptable for Xpert Xpress SARS-CoV-2/FLU/RSV testing.  Fact Sheet for Patients: EntrepreneurPulse.com.au  Fact Sheet for Healthcare Providers: IncredibleEmployment.be  This test is not yet approved or cleared by the [place] FDA and has been authorized for detection and/or diagnosis of SARS-CoV-2 by FDA under an Emergency Use Authorization (EUA). This EUA will remain in effect (meaning this test can be used) for the  duration of the COVID-19 declaration under Section 564(b)(1) of the Act, 21 U.S.C. section 360bbb-3(b)(1), unless the authorization is terminated or revoked.  Performed at [facility], [address], [place], [place] [address]      Studies: MR BRAIN WO CONTRAST  Result Date: 10/11/2020 CLINICAL DATA:  Mental status change. EXAM: MRI HEAD WITHOUT CONTRAST TECHNIQUE: Multiplanar, multiecho pulse sequences of the brain and surrounding structures were obtained without intravenous contrast. COMPARISON:  Head CT October 10, 2020. FINDINGS: The study is partially degraded by motion. Brain: No acute infarction, hemorrhage, hydrocephalus, extra-axial collection or mass lesion. Encephalomalacia  and gliosis in the right occipital lobe, consistent old right PCA territory infarct. Mild parenchymal volume loss. Mild patchy T2 hyperintensity of the periventricular white matter, nonspecific, most likely related to chronic microangiopathic changes. Vascular: Normal flow voids. Skull and upper cervical spine: Grossly unremarkable. Sinuses/Orbits: Negative. IMPRESSION: 1. Motion degraded study. 2. No acute intracranial abnormality. 3. 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 4. Mild parenchymal volume loss and chronic microangiopathic changes. Electronically Signed   By: Pedro Earls M.D.   On: 10/11/2020 22:07   US RENAL  Result Date: 10/10/2020 CLINICAL DATA:  Acute renal insufficiency EXAM: RENAL / URINARY TRACT ULTRASOUND COMPLETE COMPARISON:  12/23/2019 FINDINGS: Right Kidney: Renal measurements: 8.4 x 4.3 by 4.4 cm = volume: 81.8 mL. Echogenicity is within normal limits. Nonobstructing calculi measuring up to 7 mm. No hydronephrosis. Left Kidney: Renal measurements: 9.9 x 4.9 x 4.5 cm = volume: 113.0 mL. Nonobstructing 8 mm calculus lower pole. Echogenicity is within normal limits. No hydronephrosis. Bladder: Appears normal for degree of bladder distention. Other: None. IMPRESSION: 1.  Bilateral nonobstructing renal calculi. Otherwise unremarkable renal ultrasound. Electronically Signed   By: Randa Ngo M.D.   On: 10/10/2020 17:07    Scheduled Meds: . aspirin EC  81 mg Oral Daily  . atorvastatin  40 mg Oral Daily  . citalopram  20 mg Oral Daily  . enoxaparin (LOVENOX) injection  40 mg Subcutaneous Q24H  . feeding supplement  237 mL Oral BID BM  . fluconazole  100 mg Oral Daily  . folic acid  1 mg Oral Daily  . lidocaine  1 application Urethral Once  . LORazepam  0.25 mg Oral Daily  . melatonin  2.5 mg Oral QHS  . mometasone  1 application Topical See admin instructions  . multivitamin with minerals  1 tablet Oral Daily  . QUEtiapine  100 mg Oral QHS  . tamsulosin  0.4 mg Oral Daily  . traZODone  150 mg Oral QHS   Continuous Infusions: . cefTRIAXone (ROCEPHIN)  IV 1 g (10/12/20 1114)    Assessment/Plan:  1. Acute metabolic encephalopathy.  Dehydration and polypharmacy likely playing a role.  I got rid of Wellbutrin, muscle relaxers, tramadol and Vistaril. Patient did not sleep last night so will need his usual Seroquel and trazodone dose at night.  Try to taper off Ativan.  Mental status a little bit better today than yesterday but still not back to his baseline. 2. Acute kidney injury.  Creatinine was 1.3 on presentation down to 0.76 today with IV fluid hydration. 3. Urine culture negative.  Antibiotics stopped. 4. BPH on Flomax 5. Essential hypertension.  Continue to monitor off medication 6. Hyperlipidemia unspecified on atorvastatin 7. Anxiety depression on Celexa 8. Weakness.  As of yesterday physical therapy recommends rehab.  As mental status improves hopefully will be able to get stronger.      Code Status:     Code Status Orders  (From admission, onward)         Start     Ordered   10/10/20 1238  Full code  Continuous        10/10/20 1239        Code Status History    Date Active Date Inactive Code Status Order ID Comments User  Context   03/12/2018 2017 03/13/2018 2110 Full Code [id number]  Henreitta Leber, MD Inpatient   07/28/2012 1508 07/29/2012 1324 Full Code [id number]  Carlisle Cater, [place] [facility]   Advance Care Planning Activity  Family Communication: Spoke with wife at the bedside and daughter on the phone Disposition Plan: Status is: Inpatient  Dispo: The patient is from: Home              Anticipated d/c is to: Likely rehab but if mental status and strength improves can potentially go home with home health depending on physical therapy progress.              Anticipated d/c date is: Likely will need a few more days here in the hospital              Patient currently being watched closely for altered mental status and did not sleep last night.  Adjusting medications to get him sleeping at night and awake during the day  Time spent: 28 minutes  [place]

## 2020-10-12 NOTE — Progress Notes (Signed)
PIV consult: Pt asleep. Discussed with RN he has been restless, she will replace consult when pt is awake.

## 2020-10-13 DIAGNOSIS — N400 Enlarged prostate without lower urinary tract symptoms: Secondary | ICD-10-CM | POA: Diagnosis not present

## 2020-10-13 DIAGNOSIS — G9341 Metabolic encephalopathy: Secondary | ICD-10-CM | POA: Diagnosis not present

## 2020-10-13 DIAGNOSIS — R531 Weakness: Secondary | ICD-10-CM | POA: Diagnosis not present

## 2020-10-13 DIAGNOSIS — N179 Acute kidney failure, unspecified: Secondary | ICD-10-CM | POA: Diagnosis not present

## 2020-10-13 MED ORDER — QUETIAPINE FUMARATE 25 MG PO TABS
150.0000 mg | ORAL_TABLET | Freq: Every day | ORAL | Status: DC
Start: 2020-10-13 — End: 2020-10-16
  Administered 2020-10-13 – 2020-10-15 (×3): 150 mg via ORAL
  Filled 2020-10-13 (×3): qty 6

## 2020-10-13 MED ORDER — AMLODIPINE BESYLATE 5 MG PO TABS
5.0000 mg | ORAL_TABLET | Freq: Every day | ORAL | Status: DC
  Administered 2020-10-13 – 2020-10-18 (×6): 5 mg via ORAL
  Filled 2020-10-13 (×6): qty 1

## 2020-10-13 NOTE — Progress Notes (Signed)
Patient ID: [name], male   DOB: 10/01/1946, 74 y.o.   MRN: [id number] [name] [id number] DOB: [birth year] DOA: 10/10/2020 PCP: Cameron Harrier, MD  HPI/Subjective: The patient is feeling little bit better.  He is hoping to go home.  Awaiting physical therapy reevaluation to see if he does any better.  Previous evaluation recommended rehab.  Family thinks he is doing a little bit better also.  Patient stated he only slept 3 hours last night.  He states he takes 150 mg of Seroquel at night  Objective: Vitals:   10/13/20 0826 10/13/20 1200  BP: (!) 154/73 (!) 173/71  Pulse: 74 80  Resp: 16 17  Temp: 97.7 F (36.5 C) 98.6 F (37 C)  SpO2: 97% 98%    Intake/Output Summary (Last 24 hours) at 10/13/2020 1455 Last data filed at 10/13/2020 1058 Gross per 24 hour  Intake 357 ml  Output --  Net 357 ml   Filed Weights   10/10/20 1005  Weight: 65 kg    ROS: Review of Systems  Respiratory: Negative for shortness of breath.   Cardiovascular: Negative for chest pain.  Gastrointestinal: Negative for abdominal pain.   Exam: Physical Exam HENT:     Head: Normocephalic.     Mouth/Throat:     Pharynx: No oropharyngeal exudate.  Eyes:     General: Lids are normal.     Conjunctiva/sclera: Conjunctivae normal.  Cardiovascular:     Rate and Rhythm: Normal rate and regular rhythm.     Heart sounds: Normal heart sounds, S1 normal and S2 normal.  Pulmonary:     Breath sounds: Normal breath sounds. No decreased breath sounds, wheezing, rhonchi or rales.  Abdominal:     Palpations: Abdomen is soft.     Tenderness: There is no abdominal tenderness.  Musculoskeletal:     Right lower leg: No swelling.     Left lower leg: No swelling.  Skin:    General: Skin is warm.     Findings: No rash.  Neurological:     Mental Status: He is alert.     Comments: Answers some questions unable to have a better conversation today than previously.        Data Reviewed: Basic Metabolic Panel: Recent Labs  Lab 10/10/20 1009 10/10/20 1248 10/11/20 0702 10/12/20 0523  NA 143  --  138 144  K 4.2  --  3.3* 3.8  CL 105  --  105 110  CO2 22  --  22 25  GLUCOSE 133*  --  115* 128*  BUN 25*  --  21 17  CREATININE 1.30*  --  0.96 0.75  CALCIUM 9.7  --  8.4* 9.3  MG  --  2.3  --   --   PHOS  --  3.2  --   --    Liver Function Tests: Recent Labs  Lab 10/10/20 1009  AST 171*  ALT 30  ALKPHOS 86  BILITOT 1.2  PROT 8.1  ALBUMIN 4.4    Recent Labs  Lab 10/10/20 1120  AMMONIA 15   CBC: Recent Labs  Lab 10/10/20 1009 10/10/20 1248 10/11/20 0702 10/12/20 0523  WBC 13.3* 15.2* 9.9 7.3  NEUTROABS 9.9*  --   --   --   HGB 15.2 15.0 13.0 14.1  HCT 45.1 44.6 38.6* 42.1  MCV 89.7 90.7 90.6 90.7  PLT 223 206 177 184    Recent Results (from the past 240 hour(s))  Urine culture     Status: None   Collection Time: 10/10/20 11:21 AM   Specimen: Urine, Random  Result Value Ref Range Status   Specimen Description   Final    URINE, RANDOM Performed at [facility], [address], [place], [place] [address]    Special Requests   Final    NONE Performed at [facility], [address], [place], [place] [address]    Culture   Final    NO GROWTH Performed at [facility], [place] [address], [place], [place] [address]    Report Status 10/11/2020 FINAL  Final  Resp Panel by RT-PCR (Flu A&B, Covid) Nasopharyngeal Swab     Status: None   Collection Time: 10/10/20 12:48 PM   Specimen: Nasopharyngeal Swab; Nasopharyngeal(NP) swabs in vial transport medium  Result Value Ref Range Status   SARS Coronavirus 2 by RT PCR NEGATIVE NEGATIVE Final    Comment: (NOTE) SARS-CoV-2 target nucleic acids are NOT DETECTED.  The SARS-CoV-2 RNA is generally detectable in upper respiratory specimens during the acute phase of infection. The lowest concentration of SARS-CoV-2 viral copies this assay can detect  is 138 copies/mL. A negative result does not preclude SARS-Cov-2 infection and should not be used as the sole basis for treatment or other patient management decisions. A negative result may occur with  improper specimen collection/handling, submission of specimen other than nasopharyngeal swab, presence of viral mutation(s) within the areas targeted by this assay, and inadequate number of viral copies(<138 copies/mL). A negative result must be combined with clinical observations, patient history, and epidemiological information. The expected result is Negative.  Fact Sheet for Patients:  EntrepreneurPulse.com.au  Fact Sheet for Healthcare Providers:  IncredibleEmployment.be  This test is no t yet approved or cleared by the [place] FDA and  has been authorized for detection and/or diagnosis of SARS-CoV-2 by FDA under an Emergency Use Authorization (EUA). This EUA will remain  in effect (meaning this test can be used) for the duration of the COVID-19 declaration under Section 564(b)(1) of the Act, 21 U.S.C.section 360bbb-3(b)(1), unless the authorization is terminated  or revoked sooner.       Influenza A by PCR NEGATIVE NEGATIVE Final   Influenza B by PCR NEGATIVE NEGATIVE Final    Comment: (NOTE) The Xpert Xpress SARS-CoV-2/FLU/RSV plus assay is intended as an aid in the diagnosis of influenza from Nasopharyngeal swab specimens and should not be used as a sole basis for treatment. Nasal washings and aspirates are unacceptable for Xpert Xpress SARS-CoV-2/FLU/RSV testing.  Fact Sheet for Patients: EntrepreneurPulse.com.au  Fact Sheet for Healthcare Providers: IncredibleEmployment.be  This test is not yet approved or cleared by the [place] FDA and has been authorized for detection and/or diagnosis of SARS-CoV-2 by FDA under an Emergency Use Authorization (EUA). This EUA will remain in effect  (meaning this test can be used) for the duration of the COVID-19 declaration under Section 564(b)(1) of the Act, 21 U.S.C. section 360bbb-3(b)(1), unless the authorization is terminated or revoked.  Performed at [facility], [address] [place], [place] [address]      Studies: MR BRAIN WO CONTRAST  Result Date: 10/11/2020 CLINICAL DATA:  Mental status change. EXAM: MRI HEAD WITHOUT CONTRAST TECHNIQUE: Multiplanar, multiecho pulse sequences of the brain and surrounding structures were obtained without intravenous contrast. COMPARISON:  Head CT October 10, 2020. FINDINGS: The study is partially degraded by motion. Brain: No acute infarction, hemorrhage, hydrocephalus, extra-axial collection or mass lesion. Encephalomalacia  and gliosis in the right occipital lobe, consistent old right PCA territory infarct. Mild parenchymal volume loss. Mild patchy T2 hyperintensity of the periventricular white matter, nonspecific, most likely related to chronic microangiopathic changes. Vascular: Normal flow voids. Skull and upper cervical spine: Grossly unremarkable. Sinuses/Orbits: Negative. IMPRESSION: 1. Motion degraded study. 2. No acute intracranial abnormality. 3. 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 4. Mild parenchymal volume loss and chronic microangiopathic changes. Electronically Signed   By: Pedro Earls M.D.   On: 10/11/2020 22:07    Scheduled Meds: . aspirin EC  81 mg Oral Daily  . atorvastatin  40 mg Oral Daily  . citalopram  20 mg Oral Daily  . enoxaparin (LOVENOX) injection  40 mg Subcutaneous Q24H  . feeding supplement  237 mL Oral BID BM  . fluconazole  100 mg Oral Daily  . folic acid  1 mg Oral Daily  . lidocaine  1 application Urethral Once  . LORazepam  0.25 mg Oral Daily  . melatonin  2.5 mg Oral QHS  . mometasone  1 application Topical See admin instructions  . multivitamin with minerals  1 tablet Oral Daily  . nicotine  21 mg Transdermal Q24H  .  QUEtiapine  150 mg Oral QHS  . tamsulosin  0.4 mg Oral Daily  . traZODone  150 mg Oral QHS    Assessment/Plan:  1. Acute metabolic encephalopathy.  Polypharmacy and dehydration likely played a role.  Continue to hold Wellbutrin, muscle relaxants, tramadol and Vistaril.  Still needs nighttime medications of Seroquel increasing to 250 mg nightly and trazodone to sleep.  Only slept 3 hours last night.  Continue to try to taper off Ativan.  Mental status slowly improving.  Family thinks mental status is getting better.  Check B12, RPR and TSH tomorrow morning.  Since patient pulled out all of his IVs and not giving anything IV we will continue with the IV out.  Also get rid of telemetry monitoring. 2. Weakness.  Need physical therapy evaluation to see how he does.  Patient hopeful to go home but last physical therapy evaluation recommended rehab. 3. Acute kidney injury with creatinine of 1.3 on presentation down to 0.75 as of yesterday.  Patient has pulled out his IVs so we will hold off on any further IV fluids. 4. BPH on Flomax 5. Essential hypertension blood pressure starting to go up so we will start low-dose Norvasc. 6. Anxiety depression on Celexa 7. Hyperlipidemia unspecified on atorvastatin     Code Status:     Code Status Orders  (From admission, onward)         Start     Ordered   10/10/20 1238  Full code  Continuous        10/10/20 1239        Code Status History    Date Active Date Inactive Code Status Order ID Comments User Context   03/12/2018 2017 03/13/2018 2110 Full Code [id number]  Henreitta Leber, MD Inpatient   07/28/2012 1508 07/29/2012 1324 Full Code [id number]  Carlisle Cater, PA ED   Advance Care Planning Activity     Family Communication: Spoke with daughter on the phone Disposition Plan: Status is: Inpatient  Dispo: The patient is from: Home              Anticipated d/c is to: Rehab at this point but if does better with physical therapy may consider home  Anticipated d/c date is: Need to obtain a rehab bed              Patient currently trying to get the patient sleeping at night with Seroquel and trazodone.  Mental status slowly improving on a daily basis.  Time spent: 27 minutes  [place]

## 2020-10-13 NOTE — Care Management Important Message (Signed)
Important Message  Patient Details  Name: [name] MRN: [id number] Date of Birth: Feb 01, 1946   Medicare Important Message Given:  Yes     Juliann Pulse A Soledad Budreau 10/13/2020, 1:01 PM

## 2020-10-13 NOTE — Progress Notes (Signed)
Physical Therapy Treatment Patient Details Name: [name] MRN: [id number] DOB: [birth year] Today's Date: 10/13/2020    History of Present Illness presented to ER secondary to AMS, decreased responsiveness, unsteady gait, L flank/back pain; admitted for management of acute metabolic encephalopathy and complicated UTI.    PT Comments    Patient more alert today and states he is motivated to go home. His wife is present and is concerned she will not be able to keep him safe at home or provide the assistance he needs. Patient continues to have significant deficits of insight and motor planning, but he is able to complete bed mobility, transfers, and ambulation with much less difficulty and significantly more distance. Was able to perform sit to stand transfers and ambulate with CGA assistance with and without RW (200 feet each). However, he continues to have deficits in awareness and judgement and requires constant supervision for safety. He lost his way and has difficulty with problem solving and judgment required to complete entire tasks. He follows 1 step commands most of the time and has a pleasant disposition. He was unable to complete bed mobility without min-mod A and appeared to lose control of his gross motor function when attempting bed mobility. He also appeared to perceive that he could not do it and got confused when cuing was attempted. He required total assist to slide up in bed and did the opposite of the cues to push with his legs. Due to her physical limitations, wife states she is unable to provide physical assist per her description and he is not safe to return home without being more mobile in the bed. His home living and PLOF was updated today since a family member was present to contribute. Therefore, it is still recommended that patient complete short term rehab prior to returning home to improve his bed mobility and decrease caregiver burden. Patient would benefit from continued  skilled physical therapy to address remaining impairments and functional limitations to work towards stated goals and return to PLOF or maximal functional independence.      Follow Up Recommendations  SNF;Supervision/Assistance - 24 hour     Equipment Recommendations  Rolling walker with 5" wheels;3in1 (PT)    Recommendations for Other Services       Precautions / Restrictions Precautions Precautions: Fall Restrictions Weight Bearing Restrictions: No    Mobility  Bed Mobility Overal bed mobility: Needs Assistance Bed Mobility: Supine to Sit;Sit to Supine     Supine to sit: Min assist;Mod assist Sit to supine: Min assist   General bed mobility comments: Patient appears to lose initiation and carry through of movement when attempting bed mobility. States he cannot do it and when cued he is only able to complete partial movements or use partial amount of strength he has demonstrated control over during transfers and ambulation. Requires min/mod A at the trunk and/or legs to position in the bed. Is total assist to scoot up in the bed and cannot seem to comprehend what needs to be done to scoot up in bed. When PT places feet in hooklying position and instructs him to push with his legs on the count of three, he lifts them up in the air on the count of three.  Transfers Overall transfer level: Needs assistance Equipment used: Rolling walker (2 wheeled);None Transfers: Sit to/from Stand Sit to Stand: Min guard         General transfer comment: Patient demonstrates sit <> stand from ede of bed and from chair  with min guard to RW and without AD. Requires cuing to perform safely and stay on task. Appears to need step by step cuing to understand why he is standing up and what he has to do.  Ambulation/Gait Ambulation/Gait assistance: Min guard Gait Distance (Feet): 400 Feet (200 feet with RW and CGA, 200 feet with no AD and CGA) Assistive device: Rolling walker (2 wheeled);None Gait  Pattern/deviations: Decreased stride length;Drifts right/left Gait velocity: reduced   General Gait Details: Patient ambulated around nursing station with RW and CGA with short step length and just adequate foot clearance, then ambulated a second lap with no AD using CGA with similar gait pattern. Lacks insight into direction of ambulation and does veer some to the right and almost hits the counters. When asked to march with high knees, states he cannot, but does try to pick his feet up slightly higher with cuing with minimal success. Did remember his room number the second time around but needed cuing at each hallway intresection to go the correct way.   Stairs             Wheelchair Mobility    Modified Rankin (Stroke Patients Only)       Balance Overall balance assessment: Needs assistance Sitting-balance support: No upper extremity supported;Feet supported Sitting balance-Leahy Scale: Fair Sitting balance - Comments: seady sitting at edge of chair/bed. Is unable to shift weight to complete bed mobility without failing to complete movement or letting his trunk suddenly drop to the side.   Standing balance support: No upper extremity supported Standing balance-Leahy Scale: Fair Standing balance comment: Pateint is able to ambulate with short steps and stand in place with no UE support. Resisted attempts at more challenging gait pattern due to stating he felt he could not do it.                            Cognition Arousal/Alertness: Awake/alert Behavior During Therapy: WFL for tasks assessed/performed Overall Cognitive Status: History of cognitive impairments - at baseline Area of Impairment: Attention;Memory;Following commands;Safety/judgement;Awareness;Problem solving                   Current Attention Level: Selective Memory: Decreased recall of precautions;Decreased short-term memory Following Commands: Follows one step commands inconsistently;Follows  one step commands with increased time Safety/Judgement: Decreased awareness of safety;Decreased awareness of deficits   Problem Solving: Slow processing;Difficulty sequencing;Requires verbal cues;Requires tactile cues General Comments: Patient is oriented to self and has correct year. Provides some appropriate and some inaccurate information about his history. His responses are appropriate to the situati on today. He thought it was Christmas (it is Christmas Eve). Patient needs step by step cuing at times and is able to follow about 75% of the time. He needs reminders while walking to walk the correct direction and find his room. He occasionally does the opposite motion. He seems to be unable to access his strength and coordination used during ambulation and transfers when attempting bed mobilty and states he cannot do it. When he tries he is not able to complete full movements and gets stuck in the bed without physical assistance.      Exercises Other Exercises Other Exercises: Discussed discharge reccomendations and AD recocmendations. Practiced bed mobility, transfers, and ambulation.    General Comments        Pertinent Vitals/Pain Pain Assessment: No/denies pain    Home Living Family/patient expects to be discharged to:: Private residence Living  Arrangements: Spouse/significant other;Children (Daughter and granddaughter live upstairs (works), wife lives with him in basement level) Available Help at Discharge: Family;Available 24 hours/day (sometimes wife needs to run errands (no one can stay with him unless daughter can come home), wife walks with a cane) Type of Home: House Home Access: Level entry   Home Layout: Two level;Able to live on main level with bedroom/bathroom (lives in the basement level) Home Equipment: Walker - 4 wheels      Prior Function Level of Independence: Needs assistance  Gait / Transfers Assistance Needed: About 1-3 months ago he ambulated independently with  a cane on bad days for household and short community distances. ADL's / Homemaking Assistance Needed: independent with dressing, bathing. Needed help with IADLs. Comments: Patient's wife at bedside and is helpful for clarifying history. [place] the timeline is somewhat unclear. After he had a stroke in 2019 he started having more problems. It appears that recently wife has been rolling him in the rollator and assisting with all ADLs, IADLs, and mobility.   PT Goals (current goals can now be found in the care plan section) Acute Rehab PT Goals PT Goal Formulation: With patient (Patient wants to go home) Time For Goal Achievement: 10/25/20 Potential to Achieve Goals: Fair    Frequency    Min 2X/week      PT Plan Current plan remains appropriate;Equipment recommendations need to be updated    Co-evaluation              AM-PAC PT "6 Clicks" Mobility   Outcome Measure  Help needed turning from your back to your side while in a flat bed without using bedrails?: A Little Help needed moving from lying on your back to sitting on the side of a flat bed without using bedrails?: A Little Help needed moving to and from a bed to a chair (including a wheelchair)?: A Little Help needed standing up from a chair using your arms (e.g., wheelchair or bedside chair)?: A Little Help needed to walk in hospital room?: A Little Help needed climbing 3-5 steps with a railing? : A Little 6 Click Score: 18    End of Session Equipment Utilized During Treatment: Gait belt Activity Tolerance: Patient tolerated treatment well Patient left: in bed;with call bell/phone within reach;with bed alarm set;with family/visitor present Nurse Communication: Mobility status PT Visit Diagnosis: Muscle weakness (generalized) (M62.81);Difficulty in walking, not elsewhere classified (R26.2)     Time: 5093-2671 PT Time Calculation (min) (ACUTE ONLY): 50 min  Charges:  $Gait Training: 23-37 mins $Therapeutic  Activity: 8-22 mins                     Everlean Alstrom. Graylon Good, PT, DPT 10/13/20, 5:31 PM

## 2020-10-14 DIAGNOSIS — I10 Essential (primary) hypertension: Secondary | ICD-10-CM | POA: Diagnosis not present

## 2020-10-14 DIAGNOSIS — G9341 Metabolic encephalopathy: Secondary | ICD-10-CM | POA: Diagnosis not present

## 2020-10-14 DIAGNOSIS — N179 Acute kidney failure, unspecified: Secondary | ICD-10-CM | POA: Diagnosis not present

## 2020-10-14 DIAGNOSIS — R531 Weakness: Secondary | ICD-10-CM | POA: Diagnosis not present

## 2020-10-14 LAB — TSH: TSH: 4.589 u[IU]/mL — ABNORMAL HIGH (ref 0.350–4.500)

## 2020-10-14 LAB — VITAMIN B12: Vitamin B-12: 293 pg/mL (ref 180–914)

## 2020-10-14 LAB — BASIC METABOLIC PANEL
Anion gap: 11 (ref 5–15)
BUN: 13 mg/dL (ref 8–23)
CO2: 25 mmol/L (ref 22–32)
Calcium: 9.2 mg/dL (ref 8.9–10.3)
Chloride: 105 mmol/L (ref 98–111)
Creatinine, Ser: 0.6 mg/dL — ABNORMAL LOW (ref 0.61–1.24)
GFR, Estimated: >60 mL/min (ref >60)
Glucose, Bld: 137 mg/dL — ABNORMAL HIGH (ref 70–99)
Potassium: 3.4 mmol/L — ABNORMAL LOW (ref 3.5–5.1)
Sodium: 141 mmol/L (ref 135–145)

## 2020-10-14 MED ORDER — POTASSIUM CHLORIDE CRYS ER 20 MEQ PO TBCR
40.0000 meq | EXTENDED_RELEASE_TABLET | Freq: Once | ORAL | Status: AC
  Administered 2020-10-14: 40 meq via ORAL
  Filled 2020-10-14: qty 2

## 2020-10-14 NOTE — Progress Notes (Signed)
Patient ID: [name], male   DOB: 1946-05-25, 74 y.o.   MRN: [id number] [place] [id number] DOB: 1946/08/30 DOA: 10/10/2020 PCP: Tracie Harrier, MD  HPI/Subjective: This morning the patient stated he wanted to go home and he absolutely refused going out to rehab.  Patient feeling better.  Wife at the bedside states that he is not back to his baseline yet.  Patient feels okay.  Objective: Vitals:   10/14/20 0844 10/14/20 1138  BP: (!) 166/75 (!) 154/86  Pulse: 81 98  Resp: 16 16  Temp: 97.8 F (36.6 C) 98.4 F (36.9 C)  SpO2: 99% 100%    Filed Weights   10/10/20 1005  Weight: 65 kg    ROS: Review of Systems  Respiratory: Negative for cough and shortness of breath.   Cardiovascular: Negative for chest pain.  Gastrointestinal: Negative for abdominal pain, nausea and vomiting.   Exam: Physical Exam HENT:     Head: Normocephalic.     Mouth/Throat:     Pharynx: No oropharyngeal exudate.  Eyes:     General: Lids are normal.     Conjunctiva/sclera: Conjunctivae normal.  Cardiovascular:     Rate and Rhythm: Normal rate and regular rhythm.     Heart sounds: Normal heart sounds, S1 normal and S2 normal.  Pulmonary:     Breath sounds: Normal breath sounds. No decreased breath sounds, wheezing, rhonchi or rales.  Abdominal:     Palpations: Abdomen is soft.     Tenderness: There is no abdominal tenderness.  Musculoskeletal:     Right lower leg: No swelling.     Left lower leg: No swelling.  Skin:    General: Skin is warm.     Findings: No rash.  Neurological:     Mental Status: He is alert.     Comments: Answers some questions.       Data Reviewed: Basic Metabolic Panel: Recent Labs  Lab 10/10/20 1009 10/10/20 1248 10/11/20 0702 10/12/20 0523 10/14/20 0423  NA 143  --  138 144 141  K 4.2  --  3.3* 3.8 3.4*  CL 105  --  105 110 105  CO2 22  --  22 25 25   GLUCOSE 133*  --  115* 128* 137*  BUN 25*  --  21 17 13    CREATININE 1.30*  --  0.96 0.75 0.60*  CALCIUM 9.7  --  8.4* 9.3 9.2  MG  --  2.3  --   --   --   PHOS  --  3.2  --   --   --    Liver Function Tests: Recent Labs  Lab 10/10/20 1009  AST 171*  ALT 30  ALKPHOS 86  BILITOT 1.2  PROT 8.1  ALBUMIN 4.4    Recent Labs  Lab 10/10/20 1120  AMMONIA 15   CBC: Recent Labs  Lab 10/10/20 1009 10/10/20 1248 10/11/20 0702 10/12/20 0523  WBC 13.3* 15.2* 9.9 7.3  NEUTROABS 9.9*  --   --   --   HGB 15.2 15.0 13.0 14.1  HCT 45.1 44.6 38.6* 42.1  MCV 89.7 90.7 90.6 90.7  PLT 223 206 177 184     Recent Results (from the past 240 hour(s))  Urine culture     Status: None   Collection Time: 10/10/20 11:21 AM   Specimen: Urine, Random  Result Value Ref Range Status   Specimen Description   Final    URINE, RANDOM Performed at  [facility], [address] [place], [place] [address]    Special Requests   Final    NONE Performed at [facility], [address], [place], [place] [address]    Culture   Final    NO GROWTH Performed at [facility], [place] [address] [place], [place] [address]    Report Status 10/11/2020 FINAL  Final  Resp Panel by RT-PCR (Flu A&B, Covid) Nasopharyngeal Swab     Status: None   Collection Time: 10/10/20 12:48 PM   Specimen: Nasopharyngeal Swab; Nasopharyngeal(NP) swabs in vial transport medium  Result Value Ref Range Status   SARS Coronavirus 2 by RT PCR NEGATIVE NEGATIVE Final    Comment: (NOTE) SARS-CoV-2 target nucleic acids are NOT DETECTED.  The SARS-CoV-2 RNA is generally detectable in upper respiratory specimens during the acute phase of infection. The lowest concentration of SARS-CoV-2 viral copies this assay can detect is 138 copies/mL. A negative result does not preclude SARS-Cov-2 infection and should not be used as the sole basis for treatment or other patient management decisions. A negative result may occur with  improper specimen collection/handling,  submission of specimen other than nasopharyngeal swab, presence of viral mutation(s) within the areas targeted by this assay, and inadequate number of viral copies(<138 copies/mL). A negative result must be combined with clinical observations, patient history, and epidemiological information. The expected result is Negative.  Fact Sheet for Patients:  EntrepreneurPulse.com.au  Fact Sheet for Healthcare Providers:  IncredibleEmployment.be  This test is no t yet approved or cleared by the [place] FDA and  has been authorized for detection and/or diagnosis of SARS-CoV-2 by FDA under an Emergency Use Authorization (EUA). This EUA will remain  in effect (meaning this test can be used) for the duration of the COVID-19 declaration under Section 564(b)(1) of the Act, 21 U.S.C.section 360bbb-3(b)(1), unless the authorization is terminated  or revoked sooner.       Influenza A by PCR NEGATIVE NEGATIVE Final   Influenza B by PCR NEGATIVE NEGATIVE Final    Comment: (NOTE) The Xpert Xpress SARS-CoV-2/FLU/RSV plus assay is intended as an aid in the diagnosis of influenza from Nasopharyngeal swab specimens and should not be used as a sole basis for treatment. Nasal washings and aspirates are unacceptable for Xpert Xpress SARS-CoV-2/FLU/RSV testing.  Fact Sheet for Patients: EntrepreneurPulse.com.au  Fact Sheet for Healthcare Providers: IncredibleEmployment.be  This test is not yet approved or cleared by the [place] FDA and has been authorized for detection and/or diagnosis of SARS-CoV-2 by FDA under an Emergency Use Authorization (EUA). This EUA will remain in effect (meaning this test can be used) for the duration of the COVID-19 declaration under Section 564(b)(1) of the Act, 21 U.S.C. section 360bbb-3(b)(1), unless the authorization is terminated or revoked.  Performed at [facility], [address] [place], [place] [address]       Scheduled Meds: . amLODipine  5 mg Oral Daily  . aspirin EC  81 mg Oral Daily  . atorvastatin  40 mg Oral Daily  . citalopram  20 mg Oral Daily  . enoxaparin (LOVENOX) injection  40 mg Subcutaneous Q24H  . feeding supplement  237 mL Oral BID BM  . fluconazole  100 mg Oral Daily  . folic acid  1 mg Oral Daily  . lidocaine  1 application Urethral Once  . LORazepam  0.25 mg Oral Daily  . melatonin  2.5 mg Oral QHS  . mometasone  1 application Topical  See admin instructions  . multivitamin with minerals  1 tablet Oral Daily  . nicotine  21 mg Transdermal Q24H  . QUEtiapine  150 mg Oral QHS  . tamsulosin  0.4 mg Oral Daily  . traZODone  150 mg Oral QHS    Assessment/Plan:  1. Acute metabolic encephalopathy.  Polypharmacy and dehydration likely played a role.  Continue home Wellbutrin, muscle relaxants, tramadol and Vistaril.  The patient continues to need nighttime medication Seroquel 150 mg nightly and trazodone for sleep.  Patient slept better last night.  Tried to get a psychiatric consultation for competency but we do not have any floor coverage for consults today.  Case discussed with Dr. Danella Sensing psychiatry over the phone and since he is agreeable to rehab we do not have to check his competency today.  Working on telemetry psych consult.  Mental status has improved since coming into the hospital 2. Weakness.  Physical therapy still recommending rehab.  This morning when I saw him he absolutely did not want to go to rehab.  After speaking with his daughter and when I went back into the room, the patient was open to going to rehab. 3. Acute kidney injury on presentation with creatinine of 1.3.  Today's creatinine down to 0.60.  This has resolved. 4. BPH on Flomax 5. Essential hypertension on Norvasc 6. Anxiety depression on Celexa and try to taper off lorazepam 7. Hyperlipidemia unspecified on atorvastatin        Code Status:      Code Status Orders  (From admission, onward)         Start     Ordered   10/10/20 1238  Full code  Continuous        10/10/20 1239        Code Status History    Date Active Date Inactive Code Status Order ID Comments User Context   03/12/2018 2017 03/13/2018 2110 Full Code [id number]  Henreitta Leber, MD Inpatient   07/28/2012 1508 07/29/2012 1324 Full Code [id number]  Carlisle Cater, PA ED   Advance Care Planning Activity     Family Communication: Spoke with wife at the bedside and daughter on the phone Disposition Plan: Status is: Inpatient  Dispo: The patient is from: Home              Anticipated d/c is to: Rehab              Anticipated d/c date is: Earliest will be Monday, 10/16/2020              Patient currently weak awaiting to go out to rehab.  Wife states mental status not back to his baseline yet.  Time spent: 33 minutes  [place]

## 2020-10-15 DIAGNOSIS — N179 Acute kidney failure, unspecified: Secondary | ICD-10-CM | POA: Diagnosis not present

## 2020-10-15 DIAGNOSIS — R531 Weakness: Secondary | ICD-10-CM | POA: Diagnosis not present

## 2020-10-15 DIAGNOSIS — N400 Enlarged prostate without lower urinary tract symptoms: Secondary | ICD-10-CM | POA: Diagnosis not present

## 2020-10-15 DIAGNOSIS — G9341 Metabolic encephalopathy: Secondary | ICD-10-CM | POA: Diagnosis not present

## 2020-10-15 LAB — RPR: RPR Ser Ql: NONREACTIVE

## 2020-10-15 NOTE — Progress Notes (Signed)
Patient ID: [name], male   DOB: July 15, 1946, 74 y.o.   MRN: [id number] [place] [facility] [place] [id number] DOB: 1946/05/13 DOA: 10/10/2020 PCP: Tracie Harrier, MD  HPI/Subjective: Patient seen sitting in the chair eating breakfast this morning.  Patient stated he needed help getting out of the bed.  Patient states he is interested in getting out of the hospital but knows he needs to go to rehab.  Objective: Vitals:   10/15/20 0740 10/15/20 1053  BP: (!) 127/97 138/67  Pulse: 94 89  Resp: 16 18  Temp: 99.5 F (37.5 C) 98.1 F (36.7 C)  SpO2: 99% 99%    Intake/Output Summary (Last 24 hours) at 10/15/2020 1253 Last data filed at 10/15/2020 1100 Gross per 24 hour  Intake 237 ml  Output 175 ml  Net 62 ml   Filed Weights   10/10/20 1005  Weight: 65 kg    ROS: Review of Systems  Respiratory: Negative for shortness of breath.   Cardiovascular: Negative for chest pain.  Gastrointestinal: Negative for abdominal pain, nausea and vomiting.   Exam: Physical Exam HENT:     Head: Normocephalic.     Mouth/Throat:     Mouth: Mucous membranes are moist.     Pharynx: No uvula swelling.  Eyes:     General: Lids are normal.     Conjunctiva/sclera: Conjunctivae normal.     Pupils: Pupils are equal, round, and reactive to light.  Cardiovascular:     Rate and Rhythm: Normal rate and regular rhythm.     Heart sounds: Normal heart sounds, S1 normal and S2 normal.  Pulmonary:     Effort: Pulmonary effort is normal.     Breath sounds: Normal breath sounds. No decreased breath sounds, wheezing, rhonchi or rales.  Abdominal:     Palpations: Abdomen is soft.     Tenderness: There is no abdominal tenderness.  Musculoskeletal:     Right lower leg: No swelling.     Left lower leg: No swelling.  Skin:    General: Skin is warm.     Findings: No rash.  Neurological:     Mental Status: He is alert.     Comments: Patient able to communicate a little bit  better today.  He told me that one of his daughters has not been in contact with any family for about 3 weeks now.  This has been weighing on him a lot.       Data Reviewed: Basic Metabolic Panel: Recent Labs  Lab 10/10/20 1009 10/10/20 1248 10/11/20 0702 10/12/20 0523 10/14/20 0423  NA 143  --  138 144 141  K 4.2  --  3.3* 3.8 3.4*  CL 105  --  105 110 105  CO2 22  --  22 25 25   GLUCOSE 133*  --  115* 128* 137*  BUN 25*  --  21 17 13   CREATININE 1.30*  --  0.96 0.75 0.60*  CALCIUM 9.7  --  8.4* 9.3 9.2  MG  --  2.3  --   --   --   PHOS  --  3.2  --   --   --    Liver Function Tests: Recent Labs  Lab 10/10/20 1009  AST 171*  ALT 30  ALKPHOS 86  BILITOT 1.2  PROT 8.1  ALBUMIN 4.4    Recent Labs  Lab 10/10/20 1120  AMMONIA 15   CBC: Recent Labs  Lab 10/10/20 1009 10/10/20 1248 10/11/20  [id number] 10/12/20 0523  WBC 13.3* 15.2* 9.9 7.3  NEUTROABS 9.9*  --   --   --   HGB 15.2 15.0 13.0 14.1  HCT 45.1 44.6 38.6* 42.1  MCV 89.7 90.7 90.6 90.7  PLT 223 206 177 184    Recent Results (from the past 240 hour(s))  Urine culture     Status: None   Collection Time: 10/10/20 11:21 AM   Specimen: Urine, Random  Result Value Ref Range Status   Specimen Description   Final    URINE, RANDOM Performed at [facility], [address] [place], [place] [address]    Special Requests   Final    NONE Performed at [facility], [address], [place], [place] [address]    Culture   Final    NO GROWTH Performed at [facility], [place] [address], [place], [place] [address]    Report Status 10/11/2020 FINAL  Final  Resp Panel by RT-PCR (Flu A&B, Covid) Nasopharyngeal Swab     Status: None   Collection Time: 10/10/20 12:48 PM   Specimen: Nasopharyngeal Swab; Nasopharyngeal(NP) swabs in vial transport medium  Result Value Ref Range Status   SARS Coronavirus 2 by RT PCR NEGATIVE NEGATIVE Final    Comment: (NOTE) SARS-CoV-2 target nucleic acids  are NOT DETECTED.  The SARS-CoV-2 RNA is generally detectable in upper respiratory specimens during the acute phase of infection. The lowest concentration of SARS-CoV-2 viral copies this assay can detect is 138 copies/mL. A negative result does not preclude SARS-Cov-2 infection and should not be used as the sole basis for treatment or other patient management decisions. A negative result may occur with  improper specimen collection/handling, submission of specimen other than nasopharyngeal swab, presence of viral mutation(s) within the areas targeted by this assay, and inadequate number of viral copies(<138 copies/mL). A negative result must be combined with clinical observations, patient history, and epidemiological information. The expected result is Negative.  Fact Sheet for Patients:  EntrepreneurPulse.com.au  Fact Sheet for Healthcare Providers:  IncredibleEmployment.be  This test is no t yet approved or cleared by the [place] FDA and  has been authorized for detection and/or diagnosis of SARS-CoV-2 by FDA under an Emergency Use Authorization (EUA). This EUA will remain  in effect (meaning this test can be used) for the duration of the COVID-19 declaration under Section 564(b)(1) of the Act, 21 U.S.C.section 360bbb-3(b)(1), unless the authorization is terminated  or revoked sooner.       Influenza A by PCR NEGATIVE NEGATIVE Final   Influenza B by PCR NEGATIVE NEGATIVE Final    Comment: (NOTE) The Xpert Xpress SARS-CoV-2/FLU/RSV plus assay is intended as an aid in the diagnosis of influenza from Nasopharyngeal swab specimens and should not be used as a sole basis for treatment. Nasal washings and aspirates are unacceptable for Xpert Xpress SARS-CoV-2/FLU/RSV testing.  Fact Sheet for Patients: EntrepreneurPulse.com.au  Fact Sheet for Healthcare Providers: IncredibleEmployment.be  This test is  not yet approved or cleared by the [place] FDA and has been authorized for detection and/or diagnosis of SARS-CoV-2 by FDA under an Emergency Use Authorization (EUA). This EUA will remain in effect (meaning this test can be used) for the duration of the COVID-19 declaration under Section 564(b)(1) of the Act, 21 U.S.C. section 360bbb-3(b)(1), unless the authorization is terminated or revoked.  Performed at [facility], [address], [place], [place] [address]      Scheduled Meds: . amLODipine  5 mg  Oral Daily  . aspirin EC  81 mg Oral Daily  . atorvastatin  40 mg Oral Daily  . citalopram  20 mg Oral Daily  . enoxaparin (LOVENOX) injection  40 mg Subcutaneous Q24H  . feeding supplement  237 mL Oral BID BM  . fluconazole  100 mg Oral Daily  . folic acid  1 mg Oral Daily  . lidocaine  1 application Urethral Once  . LORazepam  0.25 mg Oral Daily  . melatonin  2.5 mg Oral QHS  . mometasone  1 application Topical See admin instructions  . multivitamin with minerals  1 tablet Oral Daily  . nicotine  21 mg Transdermal Q24H  . QUEtiapine  150 mg Oral QHS  . tamsulosin  0.4 mg Oral Daily  . traZODone  150 mg Oral QHS    Assessment/Plan:  1. Acute metabolic encephalopathy.  Polypharmacy and dehydration played a role.  Holding Wellbutrin, muscle relaxants, tramadol and Vistaril.  Patient needs the nighttime medications in order to sleep. Still no psychiatric consultation availability.  Patient now agreeable to rehab. 2. Weakness.  Physical therapy recommending rehab.  Patient now agreeable. 3. Acute kidney injury.  This has resolved.  Creatinine 1.3 on presentation down to 0.60 4. BPH on Flomax 5. Essential hypertension on Norvasc 6. Anxiety depression on Celexa and try to taper to the lowest dose possible of the lorazepam.  Patient states that the lorazepam has been helping him. 7. Hyperlipidemia unspecified on atorvastatin    Code Status:     Code Status Orders   (From admission, onward)         Start     Ordered   10/10/20 1238  Full code  Continuous        10/10/20 1239        Code Status History    Date Active Date Inactive Code Status Order ID Comments User Context   03/12/2018 2017 03/13/2018 2110 Full Code [id number]  Henreitta Leber, MD Inpatient   07/28/2012 1508 07/29/2012 1324 Full Code [id number]  Carlisle Cater, PA ED   Advance Care Planning Activity     Family Communication: Spoke with daughter on the phone Disposition Plan: Status is: Inpatient  Dispo: The patient is from: Home              Anticipated d/c is to: Rehab              Anticipated d/c date is: Rehab once bed available              Patient currently watching closely for acute metabolic encephalopathy.  Physical therapy working with therapy for strengthening.  Time spent: 27 minutes  [place]

## 2020-10-16 DIAGNOSIS — T424X1A Poisoning by benzodiazepines, accidental (unintentional), initial encounter: Secondary | ICD-10-CM

## 2020-10-16 DIAGNOSIS — G9341 Metabolic encephalopathy: Secondary | ICD-10-CM | POA: Diagnosis not present

## 2020-10-16 DIAGNOSIS — T424X2D Poisoning by benzodiazepines, intentional self-harm, subsequent encounter: Secondary | ICD-10-CM | POA: Diagnosis not present

## 2020-10-16 DIAGNOSIS — F319 Bipolar disorder, unspecified: Secondary | ICD-10-CM

## 2020-10-16 DIAGNOSIS — N179 Acute kidney failure, unspecified: Secondary | ICD-10-CM | POA: Diagnosis not present

## 2020-10-16 DIAGNOSIS — R531 Weakness: Secondary | ICD-10-CM | POA: Diagnosis not present

## 2020-10-16 DIAGNOSIS — F419 Anxiety disorder, unspecified: Secondary | ICD-10-CM | POA: Diagnosis not present

## 2020-10-16 MED ORDER — QUETIAPINE FUMARATE 25 MG PO TABS
200.0000 mg | ORAL_TABLET | Freq: Every day | ORAL | Status: DC
  Administered 2020-10-16 – 2020-10-17 (×2): 200 mg via ORAL
  Filled 2020-10-16 (×2): qty 8

## 2020-10-16 NOTE — Consult Note (Signed)
[facility] Face-to-Face Psychiatry Consult   Reason for Consult: Consult for 74 year old man who came in with altered mental status Referring Physician:  Leslye Peer Patient Identification: [name] MRN:  [id number] Principal Diagnosis: Overdose of benzodiazepine Diagnosis:  Principal Problem:   Overdose of benzodiazepine Active Problems:   Essential hypertension   Anxiety and depression   Benign prostatic hyperplasia without lower urinary tract symptoms   AMS (altered mental status)   Acute metabolic encephalopathy   Acute cystitis with hematuria   Weakness   Bipolar depression ([place])   Total Time spent with patient: 1 hour  Subjective:   [name] is a 74 y.o. male patient admitted with "I overdosed on my pills".  HPI: Patient seen chart reviewed.  His ex-wife who is still his long-term companion was present but did not offer any opinions and less he asked her.  Patient reports he came into the hospital because he had been overusing his medicine.  He admits that he had been taking excessive amounts of his lorazepam as well as over-the-counter sleeping medicines and melatonin in an effort to try to "get high" but also to get some sleep.  He has been feeling severely depressed for at least a few months.  His 52 year old daughter who had previously lived with him evidently abruptly left home 3 months ago and has not made contact with him or the family.  This has torn him up and thrown him into a severe depression.  Feels negative down all the time.  Sleep is very impaired despite the medicine he takes at night.  Appetite is poor.  Energy level poor.  Admits that he has had suicidal thoughts intermittently.  Denies that he has done anything to act on it or has any plan to act on it.  He mentions how he had had seizures recently and that they made him feel like he understood the peace of dying.  Denies alcohol use denies any nonprescription drug abuse other than the over-the-counter sleeping  medicine.  Denies any hallucinations or psychotic symptoms.  Past Psychiatric History: Patient has a history of bipolar disorder.  In the computer record there are a couple of notes from Dr. Lalla Brothers from a year and a half ago trying to start him on lamotrigine.  Apparently most recently has been following up with a doctor who is been prescribing him Seroquel for his bipolar depression.  Relatively low dose.  Denies any past suicide attempts.  Patient does own firearms at home.  No reported history of violence.  Risk to Self:   Risk to Others:   Prior Inpatient Therapy:   Prior Outpatient Therapy:    Past Medical History:  Past Medical History:  Diagnosis Date  . Anxiety   . Arthritis    DDD, lumbar radiculopathy  . Bell's palsy    hx of, right side  . Bell's palsy   . Chronic back pain    followed by pain clinic at [facility]  . Depression    hx of depression  . GERD (gastroesophageal reflux disease)   . Hepatitis    hx of hepatitis C treated with Harvoni per patient  . History of kidney stones   . Hypertension   . Kidney stone    hx of  . Pre-diabetes   . Stroke ([place]) 02/2018   loss of peripheral vision and balance unsteady  . Substance abuse [facility])    heroin    Past Surgical History:  Procedure Laterality Date  . ANTERIOR  LUMBAR FUSION  05/13/2012   Procedure: ANTERIOR LUMBAR FUSION 1 LEVEL;  Surgeon: Melina Schools, MD;  Location: [place];  Service: Orthopedics;  Laterality: N/A;  ALIF L5-S1  . CHOLECYSTECTOMY    . CYSTOSCOPY W/ RETROGRADES Left 08/04/2018   Procedure: CYSTOSCOPY WITH RETROGRADE PYELOGRAM;  Surgeon: Abbie Sons, MD;  Location: [facility];  Service: Urology;  Laterality: Left;  . CYSTOSCOPY WITH INSERTION OF UROLIFT N/A 01/18/2020   Procedure: CYSTOSCOPY WITH INSERTION OF UROLIFT;  Surgeon: Abbie Sons, MD;  Location: [facility];  Service: Urology;  Laterality: N/A;  . CYSTOSCOPY WITH STENT PLACEMENT Left 08/04/2018   Procedure: CYSTOSCOPY WITH STENT PLACEMENT;   Surgeon: Abbie Sons, MD;  Location: [facility];  Service: Urology;  Laterality: Left;  . EXTRACORPOREAL SHOCK WAVE LITHOTRIPSY  2012  . EXTRACORPOREAL SHOCK WAVE LITHOTRIPSY Left 08/20/2018   Procedure: EXTRACORPOREAL SHOCK WAVE LITHOTRIPSY (ESWL);  Surgeon: Hollice Espy, MD;  Location: [facility];  Service: Urology;  Laterality: Left; (cancelled)  . EXTRACORPOREAL SHOCK WAVE LITHOTRIPSY Left 08/27/2018   Procedure: EXTRACORPOREAL SHOCK WAVE LITHOTRIPSY (ESWL);  Surgeon: Abbie Sons, MD;  Location: [facility];  Service: Urology;  Laterality: Left;  . EXTRACORPOREAL SHOCK WAVE LITHOTRIPSY Right 10/15/2018   Procedure: EXTRACORPOREAL SHOCK WAVE LITHOTRIPSY (ESWL);  Surgeon: Billey Co, MD;  Location: [facility];  Service: Urology;  Laterality: Right;  . KIDNEY STONE SURGERY    . LUMBAR FUSION  05/13/2012   Family History:  Family History  Problem Relation Age of Onset  . Stroke Mother   . Diabetes Mother   . Lung cancer Father   . Diabetes Father    Family Psychiatric  History: Some anxiety and depression Social History:  Social History   Substance and Sexual Activity  Alcohol Use Never  . Alcohol/week: 0.0 standard drinks     Social History   Substance and Sexual Activity  Drug Use Not Currently  . Types: Heroin, Cocaine, Marijuana   Comment: 1960's    Social History   Socioeconomic History  . Marital status: Divorced    Spouse name: Not on file  . Number of children: 2  . Years of education: Not on file  . Highest education level: Associate degree: occupational, Hotel manager, or vocational program  Occupational History  . Not on file  Tobacco Use  . Smoking status: Current Every Day Smoker    Packs/day: 0.25    Years: 53.00    Pack years: 13.25    Types: Cigarettes  . Smokeless tobacco: Never Used  Vaping Use  . Vaping Use: Never used  Substance and Sexual Activity  . Alcohol use: Never    Alcohol/week: 0.0 standard drinks  . Drug use: Not Currently     Types: Heroin, Cocaine, Marijuana    Comment: 1960's  . Sexual activity: Yes  Other Topics Concern  . Not on file  Social History Narrative   ** Merged History Encounter **       Social Determinants of Health   Financial Resource Strain: Not on file  Food Insecurity: Not on file  Transportation Needs: Not on file  Physical Activity: Not on file  Stress: Not on file  Social Connections: Not on file   Additional Social History:    Allergies:  No Known Allergies  Labs: No results found for this or any previous visit (from the past 48 hour(s)).  Current Facility-Administered Medications  Medication Dose Route Frequency Provider Last Rate Last Admin  . acetaminophen (TYLENOL) tablet 650 mg  650 mg Oral Q6H PRN Wynetta Fines T, MD   650 mg at 10/16/20 1043   Or  . acetaminophen (TYLENOL) suppository 650 mg  650 mg Rectal Q6H PRN Wynetta Fines T, MD      . amLODipine (NORVASC) tablet 5 mg  5 mg Oral Daily Loletha Grayer, MD   5 mg at 10/16/20 0946  . aspirin EC tablet 81 mg  81 mg Oral Daily Wynetta Fines T, MD   81 mg at 10/16/20 0946  . atorvastatin (LIPITOR) tablet 40 mg  40 mg Oral Daily Wynetta Fines T, MD   40 mg at 10/16/20 0946  . citalopram (CELEXA) tablet 20 mg  20 mg Oral Daily Loletha Grayer, MD   20 mg at 10/16/20 0946  . enoxaparin (LOVENOX) injection 40 mg  40 mg Subcutaneous Q24H Loletha Grayer, MD   40 mg at 10/15/20 2115  . feeding supplement (ENSURE ENLIVE / ENSURE PLUS) liquid 237 mL  237 mL Oral BID BM Loletha Grayer, MD   237 mL at 10/16/20 0947  . folic acid (FOLVITE) tablet 1 mg  1 mg Oral Daily Wynetta Fines T, MD   1 mg at 10/16/20 0946  . haloperidol lactate (HALDOL) injection 5 mg  5 mg Intravenous Q6H PRN Wynetta Fines T, MD   5 mg at 10/12/20 1042  . labetalol (NORMODYNE) tablet 100 mg  100 mg Oral Q6H PRN Wynetta Fines T, MD      . lidocaine (XYLOCAINE) 2 % jelly 1 application  1 application Urethral Once Wynetta Fines T, MD      . LORazepam (ATIVAN) tablet  0.25 mg  0.25 mg Oral Daily Loletha Grayer, MD   0.25 mg at 10/16/20 0946  . melatonin tablet 2.5 mg  2.5 mg Oral QHS Loletha Grayer, MD   2.5 mg at 10/15/20 2116  . mometasone (ELOCON) 0.1 % cream 1 application  1 application Topical See admin instructions Wynetta Fines T, MD      . multivitamin with minerals tablet 1 tablet  1 tablet Oral Daily Wynetta Fines T, MD   1 tablet at 10/16/20 0946  . nicotine (NICODERM CQ - dosed in mg/24 hours) patch 21 mg  21 mg Transdermal Q24H Loletha Grayer, MD   21 mg at 10/15/20 1714  . ondansetron (ZOFRAN) tablet 4 mg  4 mg Oral Q6H PRN Wynetta Fines T, MD       Or  . ondansetron [facility]) injection 4 mg  4 mg Intravenous Q6H PRN Wynetta Fines T, MD      . QUEtiapine (SEROQUEL) tablet 150 mg  150 mg Oral QHS Loletha Grayer, MD   150 mg at 10/15/20 2116  . tamsulosin (FLOMAX) capsule 0.4 mg  0.4 mg Oral Daily Wynetta Fines T, MD   0.4 mg at 10/16/20 0946  . traZODone (DESYREL) tablet 150 mg  150 mg Oral QHS Loletha Grayer, MD   150 mg at 10/15/20 2115    Musculoskeletal: Strength & Muscle Tone: within normal limits Gait & Station: normal Patient leans: N/A  Psychiatric Specialty Exam: Physical Exam Vitals and nursing note reviewed.  Constitutional:      Appearance: He is well-developed and well-nourished.  HENT:     Head: Normocephalic and atraumatic.  Eyes:     Conjunctiva/sclera: Conjunctivae normal.     Pupils: Pupils are equal, round, and reactive to light.  Cardiovascular:     Heart sounds: Normal heart sounds.  Pulmonary:     Effort: Pulmonary effort is normal.  Abdominal:     Palpations: Abdomen is soft.  Musculoskeletal:        General: Normal range of motion.     Cervical back: Normal range of motion.  Skin:    General: Skin is warm and dry.  Neurological:     General: No focal deficit present.     Mental Status: He is alert.  Psychiatric:        Attention and Perception: Attention normal.        Mood and Affect: Mood is  depressed. Affect is blunt.        Speech: Speech is delayed.        Behavior: Behavior is slowed.        Thought Content: Thought content includes suicidal ideation. Thought content does not include suicidal plan.        Cognition and Memory: Cognition normal.        Judgment: Judgment is impulsive.     Review of Systems  Constitutional: Negative.   HENT: Negative.   Eyes: Negative.   Respiratory: Negative.   Cardiovascular: Negative.   Gastrointestinal: Negative.   Musculoskeletal: Negative.   Skin: Negative.   Neurological: Negative.   Psychiatric/Behavioral: Positive for dysphoric mood, sleep disturbance and suicidal ideas. The patient is nervous/anxious.     Blood pressure 127/76, pulse 89, temperature 98.4 F (36.9 C), resp. rate 17, height 5\' 2"  (1.575 m), weight 65 kg, SpO2 99 %.Body mass index is 26.21 kg/m.  General Appearance: Casual  Eye Contact:  Good  Speech:  Clear and Coherent and Slow  Volume:  Decreased  Mood:  Depressed and Dysphoric  Affect:  Constricted and Depressed  Thought Process:  Coherent  Orientation:  Full (Time, Place, and Person)  Thought Content:  Logical  Suicidal Thoughts:  Yes.  without intent/plan  Homicidal Thoughts:  No  Memory:  Immediate;   Fair Recent;   Fair Remote;   Fair  Judgement:  Fair  Insight:  Fair  Psychomotor Activity:  Decreased  Concentration:  Concentration: Fair  Recall:  [employer] of Knowledge:  Fair  Language:  Fair  Akathisia:  No  Handed:  Right  AIMS (if indicated):     Assets:  Desire for Improvement Housing Resilience Social Support  ADL's:  Intact  Cognition:  WNL  Sleep:        Treatment Plan Summary: Daily contact with patient to assess and evaluate symptoms and progress in treatment, Medication management and Plan In the short-term I suggested increasing his Seroquel to 200 mg at night.  I think however the best thing we could do would be to try to get him to inpatient psychiatric treatment.   Patient is very depressed and not making much progress with that and has a lot of home stress.  I discussed this with him and his ex-wife and he seemed open to it.  I will try to see if I can pursue referral to geriatric psychiatry or to our inpatient unit.  Disposition: Recommend psychiatric Inpatient admission when medically cleared.  Alethia Berthold, MD 10/16/2020 6:02 PM

## 2020-10-16 NOTE — Progress Notes (Signed)
Patient ID: [name], male   DOB: Jan 31, 1946, 74 y.o.   MRN: [id number] [facility] PROGRESS NOTE  SKYLIER KESTLER [id number] DOB: 03-15-1946 DOA: 10/10/2020 PCP: Tracie Harrier, MD  HPI/Subjective: Patient interested in getting out of the hospital.  This morning notes he is weak and needed to go to rehab.  Patient still concerned that his daughter has been missing and not having contact with other family and himself.  He states that this has been on his mind.  He states he has difficulty sleeping without the medications at night.  Objective: Vitals:   10/16/20 0748 10/16/20 1131  BP: (!) 149/70 (!) 141/65  Pulse: 82 92  Resp: 16 17  Temp: 98.4 F (36.9 C) 98.2 F (36.8 C)  SpO2: 96% 99%    Intake/Output Summary (Last 24 hours) at 10/16/2020 1511 Last data filed at 10/16/2020 1018 Gross per 24 hour  Intake 820 ml  Output 125 ml  Net 695 ml   Filed Weights   10/10/20 1005  Weight: 65 kg    ROS: Review of Systems  Respiratory: Negative for shortness of breath.   Cardiovascular: Negative for chest pain.  Gastrointestinal: Negative for abdominal pain, nausea and vomiting.   Exam: Physical Exam HENT:     Head: Normocephalic.     Mouth/Throat:     Pharynx: No oropharyngeal exudate.  Eyes:     General: Lids are normal.     Conjunctiva/sclera: Conjunctivae normal.  Cardiovascular:     Rate and Rhythm: Normal rate and regular rhythm.     Heart sounds: Normal heart sounds, S1 normal and S2 normal.  Pulmonary:     Breath sounds: Normal breath sounds. No decreased breath sounds, wheezing, rhonchi or rales.  Abdominal:     Palpations: Abdomen is soft.     Tenderness: There is no abdominal tenderness.  Musculoskeletal:     Right lower leg: No swelling.     Left lower leg: No swelling.  Skin:    General: Skin is warm.     Findings: No rash.  Neurological:     Mental Status: He is alert.     Comments: Able to elaborate a little bit more yesterday and today  than he did previously.       Data Reviewed: Basic Metabolic Panel: Recent Labs  Lab 10/10/20 1009 10/10/20 1248 10/11/20 0702 10/12/20 0523 10/14/20 0423  NA 143  --  138 144 141  K 4.2  --  3.3* 3.8 3.4*  CL 105  --  105 110 105  CO2 22  --  22 25 25   GLUCOSE 133*  --  115* 128* 137*  BUN 25*  --  21 17 13   CREATININE 1.30*  --  0.96 0.75 0.60*  CALCIUM 9.7  --  8.4* 9.3 9.2  MG  --  2.3  --   --   --   PHOS  --  3.2  --   --   --    Liver Function Tests: Recent Labs  Lab 10/10/20 1009  AST 171*  ALT 30  ALKPHOS 86  BILITOT 1.2  PROT 8.1  ALBUMIN 4.4    Recent Labs  Lab 10/10/20 1120  AMMONIA 15   CBC: Recent Labs  Lab 10/10/20 1009 10/10/20 1248 10/11/20 0702 10/12/20 0523  WBC 13.3* 15.2* 9.9 7.3  NEUTROABS 9.9*  --   --   --   HGB 15.2 15.0 13.0 14.1  HCT 45.1 44.6 38.6* 42.1  MCV  89.7 90.7 90.6 90.7  PLT 223 206 177 184     Recent Results (from the past 240 hour(s))  Urine culture     Status: None   Collection Time: 10/10/20 11:21 AM   Specimen: Urine, Random  Result Value Ref Range Status   Specimen Description   Final    URINE, RANDOM Performed at [facility], [address], [place], [place] [address]    Special Requests   Final    NONE Performed at [facility], [address], [place], [place] [address]    Culture   Final    NO GROWTH Performed at [facility], [place] [address] [place], [place] [address]    Report Status 10/11/2020 FINAL  Final  Resp Panel by RT-PCR (Flu A&B, Covid) Nasopharyngeal Swab     Status: None   Collection Time: 10/10/20 12:48 PM   Specimen: Nasopharyngeal Swab; Nasopharyngeal(NP) swabs in vial transport medium  Result Value Ref Range Status   SARS Coronavirus 2 by RT PCR NEGATIVE NEGATIVE Final    Comment: (NOTE) SARS-CoV-2 target nucleic acids are NOT DETECTED.  The SARS-CoV-2 RNA is generally detectable in upper respiratory specimens during the acute phase of  infection. The lowest concentration of SARS-CoV-2 viral copies this assay can detect is 138 copies/mL. A negative result does not preclude SARS-Cov-2 infection and should not be used as the sole basis for treatment or other patient management decisions. A negative result may occur with  improper specimen collection/handling, submission of specimen other than nasopharyngeal swab, presence of viral mutation(s) within the areas targeted by this assay, and inadequate number of viral copies(<138 copies/mL). A negative result must be combined with clinical observations, patient history, and epidemiological information. The expected result is Negative.  Fact Sheet for Patients:  EntrepreneurPulse.com.au  Fact Sheet for Healthcare Providers:  IncredibleEmployment.be  This test is no t yet approved or cleared by the [place] FDA and  has been authorized for detection and/or diagnosis of SARS-CoV-2 by FDA under an Emergency Use Authorization (EUA). This EUA will remain  in effect (meaning this test can be used) for the duration of the COVID-19 declaration under Section 564(b)(1) of the Act, 21 U.S.C.section 360bbb-3(b)(1), unless the authorization is terminated  or revoked sooner.       Influenza A by PCR NEGATIVE NEGATIVE Final   Influenza B by PCR NEGATIVE NEGATIVE Final    Comment: (NOTE) The Xpert Xpress SARS-CoV-2/FLU/RSV plus assay is intended as an aid in the diagnosis of influenza from Nasopharyngeal swab specimens and should not be used as a sole basis for treatment. Nasal washings and aspirates are unacceptable for Xpert Xpress SARS-CoV-2/FLU/RSV testing.  Fact Sheet for Patients: EntrepreneurPulse.com.au  Fact Sheet for Healthcare Providers: IncredibleEmployment.be  This test is not yet approved or cleared by the [place] FDA and has been authorized for detection and/or diagnosis of SARS-CoV-2  by FDA under an Emergency Use Authorization (EUA). This EUA will remain in effect (meaning this test can be used) for the duration of the COVID-19 declaration under Section 564(b)(1) of the Act, 21 U.S.C. section 360bbb-3(b)(1), unless the authorization is terminated or revoked.  Performed at [facility], [address] [place], [place] [address]      Scheduled Meds: . amLODipine  5 mg Oral Daily  . aspirin EC  81 mg Oral Daily  . atorvastatin  40 mg Oral Daily  . citalopram  20 mg Oral Daily  . enoxaparin (LOVENOX) injection  40  mg Subcutaneous Q24H  . feeding supplement  237 mL Oral BID BM  . folic acid  1 mg Oral Daily  . lidocaine  1 application Urethral Once  . LORazepam  0.25 mg Oral Daily  . melatonin  2.5 mg Oral QHS  . mometasone  1 application Topical See admin instructions  . multivitamin with minerals  1 tablet Oral Daily  . nicotine  21 mg Transdermal Q24H  . QUEtiapine  150 mg Oral QHS  . tamsulosin  0.4 mg Oral Daily  . traZODone  150 mg Oral QHS    Assessment/Plan:  1. Acute metabolic encephalopathy.  Polypharmacy and dehydration likely played a key role.  Continue to hold Wellbutrin, muscle relaxants, tramadol and Vistaril.  Continue the nighttime medications in order for the patient to sleep.  We will ask for psychiatry evaluation to help with meds.  Patient able to elaborate more yesterday and today than previously.  Patient very concerned about one of his daughters gone missing and not having contact with family.  Doing better than when he came in but family still very concerned about going home. 2. Weakness.  Physical therapy recommends rehab.  Patient agreeable to go to rehab. 3. Acute kidney injury.  This has resolved.  Creatinine 1.3 on presentation and as of 10/14/2020 down to 0.6. 4. Anxiety, depression on Celexa.  Trying to taper down to the lowest dose of lorazepam. 5. Hyperlipidemia unspecified on atorvastatin 6. Essential hypertension on  Norvasc 7. BPH on Flomax    Code Status:     Code Status Orders  (From admission, onward)         Start     Ordered   10/10/20 1238  Full code  Continuous        10/10/20 1239        Code Status History    Date Active Date Inactive Code Status Order ID Comments User Context   03/12/2018 2017 03/13/2018 2110 Full Code [id number]  Henreitta Leber, MD Inpatient   07/28/2012 1508 07/29/2012 1324 Full Code [id number]  Carlisle Cater, PA ED   Advance Care Planning Activity     Family Communication: Spoke with wife at the phone who is at the bedside Disposition Plan: Status is: Inpatient  Dispo: The patient is from: Home              Anticipated d/c is to: Rehab              Anticipated d/c date is: Whenever rehab bed obtained              Patient currently will be discharged out to rehab once I receive a rehab bed.  Since we do have psychiatry consultations available today we will get a psychiatric consultation to help out with meds.  Time spent: 28 minutes  Section

## 2020-10-16 NOTE — Care Management Important Message (Signed)
Important Message  Patient Details  Name: [name] MRN: [id number] Date of Birth: 02-16-1946   Medicare Important Message Given:  Yes     Johnell Comings 10/16/2020, 11:01 AM

## 2020-10-16 NOTE — Progress Notes (Addendum)
Physical Therapy Treatment Patient Details Name: [name] MRN: [id number] DOB: 11/06/1945 Today's Date: 10/16/2020    History of Present Illness presented to ER secondary to AMS, decreased responsiveness, unsteady gait, L flank/back pain; admitted for management of acute metabolic encephalopathy and complicated UTI.    PT Comments    Pt up in chair ready for session.  Stood with supervision and is able to walk to door with short choppy steps.  He stated he feels unsteady and is given a RW.  He is able to complete lap with slow but steady gait and no LOB.  He is able to go up/down steps with bilateral rails and no assist needed.  Returned to room to practice bed mobility left and right.  He is generally able to do without assist but on left side which is the side he sleeps on at home he has difficulty due to old CVA Left and needs min assist from a hand to pull up on.  Discussed discharge plan with wife.  She remains firm that he needs rehab and she cannot care for him at home at current level.  Reviewed assistance level with her but she stated there at still things he needs to work on.  She voices concerns over medication and fear that he will return home and be in the same situation with medication and struggle again.  Meds have been adjusted but she continues to want SNF upon discharge.  Discussed with TOC and MD regarding session and family wishes. Wife does use a SPC herself which may attribute to her fear of caring for him.   Follow Up Recommendations  SNF;Other (comment)     Equipment Recommendations  Rolling walker with 5" wheels    Recommendations for Other Services       Precautions / Restrictions Precautions Precautions: Fall Restrictions Weight Bearing Restrictions: No    Mobility  Bed Mobility Overal bed mobility: Needs Assistance Bed Mobility: Supine to Sit;Sit to Supine     Supine to sit: Supervision Sit to supine: Supervision;Min assist   General bed  mobility comments: supervision on right but min a on left due to prior CVA  Transfers Overall transfer level: Needs assistance Equipment used: Rolling walker (2 wheeled);None Transfers: Sit to/from Stand Sit to Stand: Supervision            Ambulation/Gait Ambulation/Gait assistance: Supervision;Min guard Gait Distance (Feet): 200 Feet Assistive device: Rolling walker (2 wheeled) Gait Pattern/deviations: Step-through pattern;Decreased step length - right;Decreased step length - left;Narrow base of support Gait velocity: reduced   General Gait Details: completes lap with no LOB   Stairs Stairs: Yes Stairs assistance: Min guard Stair Management: Two rails Number of Stairs: 3 General stair comments: some hesitation but no assist needed.  C/o hamstring tightness left from old CVA   Wheelchair Mobility    Modified Rankin (Stroke Patients Only)       Balance Overall balance assessment: Needs assistance Sitting-balance support: No upper extremity supported;Feet supported Sitting balance-Leahy Scale: Good     Standing balance support: Bilateral upper extremity supported Standing balance-Leahy Scale: Good                              Cognition Arousal/Alertness: Awake/alert Behavior During Therapy: WFL for tasks assessed/performed Overall Cognitive Status: History of cognitive impairments - at baseline  Exercises      General Comments        Pertinent Vitals/Pain Pain Assessment: No/denies pain    Home Living                      Prior Function            PT Goals (current goals can now be found in the care plan section) Progress towards PT goals: Progressing toward goals    Frequency    Min 2X/week      PT Plan Current plan remains appropriate    Co-evaluation              AM-PAC PT "6 Clicks" Mobility   Outcome Measure  Help needed turning from your back  to your side while in a flat bed without using bedrails?: A Little Help needed moving from lying on your back to sitting on the side of a flat bed without using bedrails?: A Little Help needed moving to and from a bed to a chair (including a wheelchair)?: A Little Help needed standing up from a chair using your arms (e.g., wheelchair or bedside chair)?: None Help needed to walk in hospital room?: A Little Help needed climbing 3-5 steps with a railing? : A Little 6 Click Score: 19    End of Session Equipment Utilized During Treatment: Gait belt Activity Tolerance: Patient tolerated treatment well Patient left: in chair;with call bell/phone within reach;with chair alarm set;with family/visitor present Nurse Communication: Mobility status PT Visit Diagnosis: Muscle weakness (generalized) (M62.81);Difficulty in walking, not elsewhere classified (R26.2)     Time: 4403-4742 PT Time Calculation (min) (ACUTE ONLY): 30 min  Charges:  $Gait Training: 8-22 mins $Therapeutic Activity: 8-22 mins                    Danielle Dess, PTA 10/16/20, 4:02 PM

## 2020-10-16 NOTE — TOC Progression Note (Signed)
Transition of Care [facility]) - Progression Note    Patient Details  Name: [name] MRN: [id number] Date of Birth: 11/01/1945  Transition of Care [facility]) CM/SW Contact  Loretto Cellar, RN Phone Number: 10/16/2020, 4:33 PM  Clinical Narrative:    Insurance authorization started for SNF via [employer] Portal.    Expected Discharge Plan: Skilled Nursing Facility Barriers to Discharge: Continued Medical Work up  Expected Discharge Plan and Services Expected Discharge Plan: Skilled Nursing Facility       Living arrangements for the past 2 months: Single Family Home                                       Social Determinants of Health (SDOH) Interventions    Readmission Risk Interventions No flowsheet data found.

## 2020-10-16 NOTE — TOC Progression Note (Addendum)
Transition of Care [facility]) - Progression Note    Patient Details  Name: [name] MRN: [id number] Date of Birth: 03-13-1946  Transition of Care [facility]) CM/SW Contact  Liliana Cline, LCSW Phone Number: 10/16/2020, 3:36 PM  Clinical Narrative:   Per chart review and RN, patient disoriented to situation. [name], informed her of only bed offer at [facility], she is at bedside and she said CSW will need to talk to patient's daughter first. Chart says patient has a legal guardian, daughter [name] reported patient does not have a legal guardian but she is trying to get guardianship. [name] is agreeable to patient going to [facility]. [name] reported patient is vaccinated for COVID. She asked about visitation, informed her that per [name] at [facility], 2 visitors at a time (no limit on number of people per day), prefer during business hours but family can come outside of business hours if they would like to. CSW informed [name] at [facility] of family accepting bed offer and will start auth.    Expected Discharge Plan: Skilled Nursing Facility Barriers to Discharge: Continued Medical Work up  Expected Discharge Plan and Services Expected Discharge Plan: Skilled Nursing Facility       Living arrangements for the past 2 months: Single Family Home                                       Social Determinants of Health (SDOH) Interventions    Readmission Risk Interventions No flowsheet data found.

## 2020-10-16 NOTE — TOC Progression Note (Signed)
Transition of Care [facility]) - Progression Note    Patient Details  Name: [name] MRN: [id number] Date of Birth: Aug 03, 1946  Transition of Care [facility]) CM/SW [place], LCSW Phone Number: 10/16/2020, 9:54 AM  Clinical Narrative:   Currently no SNF bed offers. CSW reached out to all SNFs with pending bed requests and asked them to review referral sent on 12/23. Will follow for bed offers.    Expected Discharge Plan: [place] Barriers to Discharge: Continued Medical Work up  Expected Discharge Plan and Services Expected Discharge Plan: [place] arrangements for the past 2 months: Single Family Home                                       Social Determinants of Health (SDOH) Interventions    Readmission Risk Interventions No flowsheet data found.

## 2020-10-17 ENCOUNTER — Inpatient Hospital Stay: Payer: Medicare PPO

## 2020-10-17 DIAGNOSIS — F319 Bipolar disorder, unspecified: Secondary | ICD-10-CM | POA: Diagnosis not present

## 2020-10-17 DIAGNOSIS — G9341 Metabolic encephalopathy: Secondary | ICD-10-CM | POA: Diagnosis not present

## 2020-10-17 DIAGNOSIS — N179 Acute kidney failure, unspecified: Secondary | ICD-10-CM | POA: Diagnosis not present

## 2020-10-17 DIAGNOSIS — N400 Enlarged prostate without lower urinary tract symptoms: Secondary | ICD-10-CM | POA: Diagnosis not present

## 2020-10-17 LAB — URINALYSIS, COMPLETE (UACMP) WITH MICROSCOPIC
Bacteria, UA: NONE SEEN
Bilirubin Urine: NEGATIVE
Glucose, UA: NEGATIVE mg/dL
Hgb urine dipstick: NEGATIVE
Ketones, ur: NEGATIVE mg/dL
Leukocytes,Ua: NEGATIVE
Nitrite: NEGATIVE
Protein, ur: NEGATIVE mg/dL
Specific Gravity, Urine: 1.02 (ref 1.005–1.030)
Squamous Epithelial / HPF: NONE SEEN (ref 0–5)
pH: 7 (ref 5.0–8.0)

## 2020-10-17 LAB — RESP PANEL BY RT-PCR (FLU A&B, COVID) ARPGX2
Influenza A by PCR: NEGATIVE
Influenza B by PCR: NEGATIVE
SARS Coronavirus 2 by RT PCR: NEGATIVE

## 2020-10-17 LAB — URINE DRUG SCREEN, QUALITATIVE (ARMC ONLY)
Amphetamines, Ur Screen: NOT DETECTED
Barbiturates, Ur Screen: NOT DETECTED
Benzodiazepine, Ur Scrn: NOT DETECTED
Cannabinoid 50 Ng, Ur ~~LOC~~: NOT DETECTED
Cocaine Metabolite,Ur ~~LOC~~: NOT DETECTED
MDMA (Ecstasy)Ur Screen: NOT DETECTED
Methadone Scn, Ur: NOT DETECTED
Opiate, Ur Screen: NOT DETECTED
Phencyclidine (PCP) Ur S: NOT DETECTED
Tricyclic, Ur Screen: POSITIVE — AB

## 2020-10-17 LAB — ETHANOL: Alcohol, Ethyl (B): <10 mg/dL (ref <10)

## 2020-10-17 MED ORDER — AMLODIPINE BESYLATE 5 MG PO TABS: 5.0000 mg | ORAL_TABLET | Freq: Every day | ORAL | 0 refills | Status: DC

## 2020-10-17 MED ORDER — QUETIAPINE FUMARATE 200 MG PO TABS: 200.0000 mg | ORAL_TABLET | Freq: Every day | ORAL | 0 refills | Status: DC

## 2020-10-17 MED ORDER — ENSURE ENLIVE PO LIQD: 237.0000 mL | Freq: Two times a day (BID) | ORAL | 0 refills | Status: DC

## 2020-10-17 MED ORDER — TAMSULOSIN HCL 0.4 MG PO CAPS: 0.4000 mg | ORAL_CAPSULE | Freq: Every day | ORAL | 0 refills | Status: DC

## 2020-10-17 MED ORDER — MOMETASONE FUROATE 0.1 % EX CREA
1.0000 "application " | TOPICAL_CREAM | Freq: Every day | CUTANEOUS | Status: DC
  Administered 2020-10-18: 1 via TOPICAL
  Filled 2020-10-17 (×2): qty 15

## 2020-10-17 MED ORDER — ACETAMINOPHEN 325 MG PO TABS: 650.0000 mg | ORAL_TABLET | Freq: Four times a day (QID) | ORAL | Status: DC | PRN

## 2020-10-17 MED ORDER — ASPIRIN 81 MG PO TBEC: 81.0000 mg | DELAYED_RELEASE_TABLET | Freq: Every day | ORAL | 0 refills | Status: DC

## 2020-10-17 MED ORDER — ADULT MULTIVITAMIN W/MINERALS CH: 1.0000 | ORAL_TABLET | Freq: Every day | ORAL | 0 refills | Status: DC

## 2020-10-17 MED ORDER — FOLIC ACID 1 MG PO TABS: 1.0000 mg | ORAL_TABLET | Freq: Every day | ORAL | 0 refills | Status: DC

## 2020-10-17 MED ORDER — MELATONIN 5 MG PO TABS: 2.5000 mg | ORAL_TABLET | Freq: Every day | ORAL | 0 refills | Status: DC

## 2020-10-17 MED ORDER — NICOTINE 21 MG/24HR TD PT24: 21.0000 mg | MEDICATED_PATCH | TRANSDERMAL | 0 refills | Status: DC

## 2020-10-17 MED ORDER — ATORVASTATIN CALCIUM 40 MG PO TABS: 40.0000 mg | ORAL_TABLET | Freq: Every day | ORAL | 0 refills | Status: DC

## 2020-10-17 MED ORDER — LORAZEPAM 0.5 MG PO TABS: 0.2500 mg | ORAL_TABLET | Freq: Every day | ORAL | 0 refills | Status: DC

## 2020-10-17 NOTE — Discharge Summary (Signed)
[name] at [place] NAME: [name]    MR#:  [id number]  DATE OF BIRTH:  1946/05/18  DATE OF ADMISSION:  10/10/2020 ADMITTING PHYSICIAN: Lequita Halt, MD  DATE OF DISCHARGE To [place] [facility] unit: 10/17/2020  PRIMARY CARE PHYSICIAN: Tracie Harrier, MD    ADMISSION DIAGNOSIS:  Lower urinary tract infectious disease [N39.0] Opiate use [F11.90] Microscopic hematuria [R31.29] AKI (acute kidney injury) ([place]) [N17.9] Altered mental status, unspecified altered mental status type [R41.82] AMS (altered mental status) [R41.82]  DISCHARGE DIAGNOSIS:  Principal Problem:   Overdose of benzodiazepine Active Problems:   Essential hypertension   Anxiety and depression   Benign prostatic hyperplasia without lower urinary tract symptoms   AMS (altered mental status)   Acute metabolic encephalopathy   Acute cystitis with hematuria   Weakness   Bipolar depression ([place])   SECONDARY DIAGNOSIS:   Past Medical History:  Diagnosis Date  . Anxiety   . Arthritis    DDD, lumbar radiculopathy  . Bell's palsy    hx of, right side  . Bell's palsy   . Chronic back pain    followed by [facility] clinic at [facility]  . Depression    hx of depression  . GERD (gastroesophageal reflux disease)   . Hepatitis    hx of hepatitis C treated with Harvoni per patient  . History of kidney stones   . Hypertension   . Kidney stone    hx of  . Pre-diabetes   . Stroke ([place]) 02/2018   loss of peripheral vision and balance unsteady  . Substance abuse ([place])    heroin    HOSPITAL COURSE:   1.  Acute metabolic encephalopathy.  Polypharmacy and dehydration likely played a key role in this.  Continue to hold Wellbutrin, muscle relaxants, tramadol and Vistaril.  For the first 2 days the patient was not able to communicate very well and was very impulsive.  Mental status is better than when he came in.  Able to communicate better.  Family thinks he is not quite  back to full baseline yet. 2.  Severe depression with history of bipolar disorder.  On low-dose Celexa, low-dose lorazepam.  Seroquel increased to 200 mg nightly and trazodone at 150 mg nightly.  Dr. Weber Cooks has saw the patient in consultation and recommended inpatient psychiatric following.  Patient will go to [facility] psychiatric unit when bed available.  Medically stable to discharge to psychiatric unit.  [place] wanted a urine toxicology and urine analysis and chest x-ray and Covid test prior to disposition. 3.  Weakness.  Patient has gradually improved with physical therapy.  The patient complains of some leg pain.  He was able to walk 100 feet with rolling walker around the nursing station today.  Continue ambulating with the patient.  We will hold atorvastatin. 4.  Acute kidney injury.  This has resolved.  Creatinine 1.3 on presentation down to 0.6 as of 10/14/2020. 5.  Hyperlipidemia unspecified.  Since the patient complains of some leg pain and weakness today will hold atorvastatin. 6.  Essential hypertension on Norvasc 7.  BPH on Flomax 8.  Old nonhemorrhagic CVA on aspirin 9.  Patient was given 3 days of Rocephin for positive urinalysis but urine culture was negative.  Medically stable for disposition to psychiatric unit when bed available  DISCHARGE CONDITIONS:   Psychiatric team follow-up Monday  CONSULTS OBTAINED:  Treatment Team:  Clapacs, Madie Reno, MD  DRUG ALLERGIES:  No Known Allergies  DISCHARGE MEDICATIONS:   Allergies as of 10/17/2020   No Known Allergies     Medication List    STOP taking these medications   atorvastatin 40 MG tablet Commonly known as: LIPITOR   baclofen 10 MG tablet Commonly known as: LIORESAL   buPROPion 150 MG 24 hr tablet Commonly known as: WELLBUTRIN XL   furosemide 20 MG tablet Commonly known as: LASIX   risperiDONE 0.5 MG tablet Commonly known as: RISPERDAL   traMADol 50 MG tablet Commonly known as: ULTRAM     TAKE  these medications   acetaminophen 325 MG tablet Commonly known as: TYLENOL Take 2 tablets (650 mg total) by mouth every 6 (six) hours as needed for mild pain (or Fever >/= 101).   amLODipine 5 MG tablet Commonly known as: NORVASC Take 1 tablet (5 mg total) by mouth daily. Start taking on: October 18, 2020   aspirin 81 MG EC tablet Take 1 tablet (81 mg total) by mouth daily. Swallow whole. Start taking on: October 18, 2020 What changed: additional instructions   citalopram 20 MG tablet Commonly known as: CELEXA Take 20 mg by mouth daily.   feeding supplement Liqd Take 237 mLs by mouth 2 (two) times daily between meals.   folic acid 1 MG tablet Commonly known as: FOLVITE Take 1 tablet (1 mg total) by mouth daily. Start taking on: October 18, 2020   LORazepam 0.5 MG tablet Commonly known as: ATIVAN Take 0.5 tablets (0.25 mg total) by mouth daily. Start taking on: October 18, 2020 What changed:  how much to take Another medication with the same name was removed. Continue taking this medication, and follow the directions you see here.   melatonin 5 MG Tabs Take 0.5 tablets (2.5 mg total) by mouth at bedtime.   mometasone 0.1 % cream Commonly known as: ELOCON Apply 1 application topically See admin instructions. INTO EARS DAILY   multivitamin with minerals Tabs tablet Take 1 tablet by mouth daily. Start taking on: October 18, 2020   nicotine 21 mg/24hr patch Commonly known as: NICODERM CQ - dosed in mg/24 hours Place 1 patch (21 mg total) onto the skin daily.   QUEtiapine 200 MG tablet Commonly known as: SEROQUEL Take 1 tablet (200 mg total) by mouth at bedtime. What changed:  medication strength how much to take   tamsulosin 0.4 MG Caps capsule Commonly known as: Flomax Take 1 capsule (0.4 mg total) by mouth daily.   traZODone 150 MG tablet Commonly known as: DESYREL Take 1 tablet by mouth at bedtime.        DISCHARGE INSTRUCTIONS:   Follow-up  psychiatric team 1 day  If you experience worsening of your admission symptoms, develop shortness of breath, life threatening emergency, suicidal or homicidal thoughts you must seek medical attention immediately by calling 911 or calling your MD immediately  if symptoms less severe.  You Must read complete instructions/literature along with all the possible adverse reactions/side effects for all the Medicines you take and that have been prescribed to you. Take any new Medicines after you have completely understood and accept all the possible adverse reactions/side effects.   Please note  You were cared for by a hospitalist during your hospital stay. If you have any questions about your discharge medications or the care you received while you were in the hospital after you are discharged, you can call the unit and asked to speak with the hospitalist on call if the hospitalist that took care of you is  not available. Once you are discharged, your primary care physician will handle any further medical issues. Please note that NO REFILLS for any discharge medications will be authorized once you are discharged, as it is imperative that you return to your primary care physician (or establish a relationship with a primary care physician if you do not have one) for your aftercare needs so that they can reassess your need for medications and monitor your lab values.    Today   CHIEF COMPLAINT:   Chief Complaint  Patient presents with  . Altered Mental Status    HISTORY OF PRESENT ILLNESS:  [name]  is a 74 y.o. male came in with altered mental status   VITAL SIGNS:  Blood pressure 131/66, pulse 92, temperature 97.7 F (36.5 C), resp. rate 20, height 5\' 2"  (1.575 m), weight 65 kg, SpO2 98 %.  I/O:    Intake/Output Summary (Last 24 hours) at 10/17/2020 1242 Last data filed at 10/17/2020 1013 Gross per 24 hour  Intake 240 ml  Output 100 ml  Net 140 ml    PHYSICAL EXAMINATION:   GENERAL:  74 y.o.-year-old patient lying in the bed with no acute distress.  EYES: Pupils equal, round, reactive to light and accommodation. No scleral icterus. Extraocular muscles intact.  NECK:  Supple, no jugular venous distention. No thyroid enlargement, no tenderness.  LUNGS: Normal breath sounds bilaterally, no wheezing, rales,rhonchi or crepitation. No use of accessory muscles of respiration.  CARDIOVASCULAR: S1, S2 normal. No murmurs, rubs, or gallops.  ABDOMEN: Soft, non-tender, non-distended. Bowel sounds present. No organomegaly or mass.  EXTREMITIES: No pedal edema, cyanosis, or clubbing.  NEUROLOGIC: Cranial nerves II through XII are intact. Muscle strength 5/5 in all extremities. Sensation intact. Gait not checked.  PSYCHIATRIC: The patient is alert and oriented x 3.  SKIN: No obvious rash, lesion, or ulcer.   DATA REVIEW:   CBC Recent Labs  Lab 10/12/20 0523  WBC 7.3  HGB 14.1  HCT 42.1  PLT 184    Chemistries  Recent Labs  Lab 10/10/20 1248 10/11/20 0702 10/14/20 0423  NA  --    < > 141  K  --    < > 3.4*  CL  --    < > 105  CO2  --    < > 25  GLUCOSE  --    < > 137*  BUN  --    < > 13  CREATININE  --    < > 0.60*  CALCIUM  --    < > 9.2  MG 2.3  --   --    < > = values in this interval not displayed.    Microbiology Results  Results for orders placed or performed during the hospital encounter of 10/10/20  Urine culture     Status: None   Collection Time: 10/10/20 11:21 AM   Specimen: Urine, Random  Result Value Ref Range Status   Specimen Description   Final    URINE, RANDOM Performed at [facility], [address], [place], [place]    Special Requests   Final    NONE Performed at [facility], [address], [place], [place]    Culture   Final    NO GROWTH Performed at [facility], [address], [place], [place]    Report Status 10/11/2020 FINAL  Final  Resp Panel by RT-PCR  (Flu A&B, Covid) Nasopharyngeal Swab  Status: None   Collection Time: 10/10/20 12:48 PM   Specimen: Nasopharyngeal Swab; Nasopharyngeal(NP) swabs in vial transport medium  Result Value Ref Range Status   SARS Coronavirus 2 by RT PCR NEGATIVE NEGATIVE Final    Comment: (NOTE) SARS-CoV-2 target nucleic acids are NOT DETECTED.  The SARS-CoV-2 RNA is generally detectable in upper respiratory specimens during the acute phase of infection. The lowest concentration of SARS-CoV-2 viral copies this assay can detect is 138 copies/mL. A negative result does not preclude SARS-Cov-2 infection and should not be used as the sole basis for treatment or other patient management decisions. A negative result may occur with  improper specimen collection/handling, submission of specimen other than nasopharyngeal swab, presence of viral mutation(s) within the areas targeted by this assay, and inadequate number of viral copies(<138 copies/mL). A negative result must be combined with clinical observations, patient history, and epidemiological information. The expected result is Negative.  Fact Sheet for Patients:  EntrepreneurPulse.com.au  Fact Sheet for Healthcare Providers:  IncredibleEmployment.be  This test is no t yet approved or cleared by the [place] FDA and  has been authorized for detection and/or diagnosis of SARS-CoV-2 by FDA under an Emergency Use Authorization (EUA). This EUA will remain  in effect (meaning this test can be used) for the duration of the COVID-19 declaration under Section 564(b)(1) of the Act, 21 U.S.C.section 360bbb-3(b)(1), unless the authorization is terminated  or revoked sooner.       Influenza A by PCR NEGATIVE NEGATIVE Final   Influenza B by PCR NEGATIVE NEGATIVE Final    Comment: (NOTE) The Xpert Xpress SARS-CoV-2/FLU/RSV plus assay is intended as an aid in the diagnosis of influenza from Nasopharyngeal swab specimens  and should not be used as a sole basis for treatment. Nasal washings and aspirates are unacceptable for Xpert Xpress SARS-CoV-2/FLU/RSV testing.  Fact Sheet for Patients: EntrepreneurPulse.com.au  Fact Sheet for Healthcare Providers: IncredibleEmployment.be  This test is not yet approved or cleared by the [place] FDA and has been authorized for detection and/or diagnosis of SARS-CoV-2 by FDA under an Emergency Use Authorization (EUA). This EUA will remain in effect (meaning this test can be used) for the duration of the COVID-19 declaration under Section 564(b)(1) of the Act, 21 U.S.C. section 360bbb-3(b)(1), unless the authorization is terminated or revoked.  Performed at [facility], [address] [place], [place] [address]     Management plans discussed with the patient, family and they are in agreement.  CODE STATUS:     Code Status Orders  (From admission, onward)         Start     Ordered   10/10/20 1238  Full code  Continuous        10/10/20 1239        Code Status History    Date Active Date Inactive Code Status Order ID Comments User Context   03/12/2018 2017 03/13/2018 2110 Full Code [id number]  Henreitta Leber, MD Inpatient   07/28/2012 1508 07/29/2012 1324 Full Code [id number]  Carlisle Cater, PA ED   Advance Care Planning Activity      TOTAL TIME TAKING CARE OF THIS PATIENT: 32 minutes.    Loletha Grayer M.D on 10/17/2020 at 12:42 PM  Between 7am to 6pm - Pager - [phone]  After 6pm go to [email or web address] - password [email or web address]  [facility]  CC: Primary care physician; Tracie Harrier, MD

## 2020-10-17 NOTE — Progress Notes (Signed)
Patient ID: CEDERICK NEWMANN, male   DOB: [birth year], 74 y.o.   MRN: [id number] [address] [id number] DOB: [birth year] DOA: 10/10/2020 PCP: Tracie Harrier, MD  HPI/Subjective: Patient feels okay.  Has a little pain in his legs.  Always has a little low back pain.  Did better with physical therapy today.  Brought in by family for altered mental status.  Objective: Vitals:   10/17/20 0724 10/17/20 1141  BP: 114/78 131/66  Pulse: 84 92  Resp: 19 20  Temp: 98.5 F (36.9 C) 97.7 F (36.5 C)  SpO2: 99% 98%    Intake/Output Summary (Last 24 hours) at 10/17/2020 1652 Last data filed at 10/17/2020 1013 Gross per 24 hour  Intake 240 ml  Output 100 ml  Net 140 ml   Filed Weights   10/10/20 1005  Weight: 65 kg    ROS: Review of Systems  Respiratory: Negative for shortness of breath.   Cardiovascular: Negative for chest pain.  Gastrointestinal: Negative for abdominal pain, nausea and vomiting.   Exam: Physical Exam HENT:     Head: Normocephalic.     Mouth/Throat:     Pharynx: No oropharyngeal exudate.  Eyes:     General: Lids are normal.     Conjunctiva/sclera: Conjunctivae normal.  Cardiovascular:     Rate and Rhythm: Normal rate and regular rhythm.     Heart sounds: Normal heart sounds, S1 normal and S2 normal.  Pulmonary:     Breath sounds: No decreased breath sounds, wheezing, rhonchi or rales.  Abdominal:     Palpations: Abdomen is soft.     Tenderness: There is no abdominal tenderness.  Musculoskeletal:     Right lower leg: No swelling.     Left lower leg: No swelling.  Skin:    General: Skin is warm.     Findings: No rash.  Neurological:     Mental Status: He is alert.     Comments: Able to elaborate more these last few days than he did when he came in.       Data Reviewed: Basic Metabolic Panel: Recent Labs  Lab 10/11/20 0702 10/12/20 0523 10/14/20 0423  NA 138 144 141  K 3.3* 3.8 3.4*  CL 105 110 105  CO2  22 25 25   GLUCOSE 115* 128* 137*  BUN 21 17 13   CREATININE 0.96 0.75 0.60*  CALCIUM 8.4* 9.3 9.2   CBC: Recent Labs  Lab 10/11/20 0702 10/12/20 0523  WBC 9.9 7.3  HGB 13.0 14.1  HCT 38.6* 42.1  MCV 90.6 90.7  PLT 177 184     Recent Results (from the past 240 hour(s))  Urine culture     Status: None   Collection Time: 10/10/20 11:21 AM   Specimen: Urine, Random  Result Value Ref Range Status   Specimen Description   Final    URINE, RANDOM Performed at [facility], [address] [place], [place] [address]    Special Requests   Final    NONE Performed at [facility], [address], [place], [place] [address]    Culture   Final    NO GROWTH Performed at [facility], [place] [address] [place], [place] [address]    Report Status 10/11/2020 FINAL  Final  Resp Panel by RT-PCR (Flu A&B, Covid) Nasopharyngeal Swab     Status: None   Collection Time: 10/10/20 12:48 PM   Specimen: Nasopharyngeal Swab; Nasopharyngeal(NP) swabs in vial transport medium  Result Value Ref Range Status   SARS Coronavirus 2 by RT PCR NEGATIVE NEGATIVE Final    Comment: (NOTE) SARS-CoV-2 target nucleic acids are NOT DETECTED.  The SARS-CoV-2 RNA is generally detectable in upper respiratory specimens during the acute phase of infection. The lowest concentration of SARS-CoV-2 viral copies this assay can detect is 138 copies/mL. A negative result does not preclude SARS-Cov-2 infection and should not be used as the sole basis for treatment or other patient management decisions. A negative result may occur with  improper specimen collection/handling, submission of specimen other than nasopharyngeal swab, presence of viral mutation(s) within the areas targeted by this assay, and inadequate number of viral copies(<138 copies/mL). A negative result must be combined with clinical observations, patient history, and epidemiological information. The expected result is  Negative.  Fact Sheet for Patients:  EntrepreneurPulse.com.au  Fact Sheet for Healthcare Providers:  IncredibleEmployment.be  This test is no t yet approved or cleared by the [place] FDA and  has been authorized for detection and/or diagnosis of SARS-CoV-2 by FDA under an Emergency Use Authorization (EUA). This EUA will remain  in effect (meaning this test can be used) for the duration of the COVID-19 declaration under Section 564(b)(1) of the Act, 21 U.S.C.section 360bbb-3(b)(1), unless the authorization is terminated  or revoked sooner.       Influenza A by PCR NEGATIVE NEGATIVE Final   Influenza B by PCR NEGATIVE NEGATIVE Final    Comment: (NOTE) The Xpert Xpress SARS-CoV-2/FLU/RSV plus assay is intended as an aid in the diagnosis of influenza from Nasopharyngeal swab specimens and should not be used as a sole basis for treatment. Nasal washings and aspirates are unacceptable for Xpert Xpress SARS-CoV-2/FLU/RSV testing.  Fact Sheet for Patients: EntrepreneurPulse.com.au  Fact Sheet for Healthcare Providers: IncredibleEmployment.be  This test is not yet approved or cleared by the [place] FDA and has been authorized for detection and/or diagnosis of SARS-CoV-2 by FDA under an Emergency Use Authorization (EUA). This EUA will remain in effect (meaning this test can be used) for the duration of the COVID-19 declaration under Section 564(b)(1) of the Act, 21 U.S.C. section 360bbb-3(b)(1), unless the authorization is terminated or revoked.  Performed at [facility], [address] [place], [place] [address]   Resp Panel by RT-PCR (Flu A&B, Covid) Nasopharyngeal Swab     Status: None   Collection Time: 10/17/20 11:22 AM   Specimen: Nasopharyngeal Swab; Nasopharyngeal(NP) swabs in vial transport medium  Result Value Ref Range Status   SARS Coronavirus 2 by RT PCR NEGATIVE NEGATIVE  Final    Comment: (NOTE) SARS-CoV-2 target nucleic acids are NOT DETECTED.  The SARS-CoV-2 RNA is generally detectable in upper respiratory specimens during the acute phase of infection. The lowest concentration of SARS-CoV-2 viral copies this assay can detect is 138 copies/mL. A negative result does not preclude SARS-Cov-2 infection and should not be used as the sole basis for treatment or other patient management decisions. A negative result may occur with  improper specimen collection/handling, submission of specimen other than nasopharyngeal swab, presence of viral mutation(s) within the areas targeted by this assay, and inadequate number of viral copies(<138 copies/mL). A negative result must be combined with clinical observations, patient history, and epidemiological information. The expected result is Negative.  Fact Sheet for Patients:  EntrepreneurPulse.com.au  Fact Sheet for Healthcare Providers:  IncredibleEmployment.be  This test is no t yet approved or cleared by the [place] FDA and  has been authorized for detection and/or  diagnosis of SARS-CoV-2 by FDA under an Emergency Use Authorization (EUA). This EUA will remain  in effect (meaning this test can be used) for the duration of the COVID-19 declaration under Section 564(b)(1) of the Act, 21 U.S.C.section 360bbb-3(b)(1), unless the authorization is terminated  or revoked sooner.       Influenza A by PCR NEGATIVE NEGATIVE Final   Influenza B by PCR NEGATIVE NEGATIVE Final    Comment: (NOTE) The Xpert Xpress SARS-CoV-2/FLU/RSV plus assay is intended as an aid in the diagnosis of influenza from Nasopharyngeal swab specimens and should not be used as a sole basis for treatment. Nasal washings and aspirates are unacceptable for Xpert Xpress SARS-CoV-2/FLU/RSV testing.  Fact Sheet for Patients: EntrepreneurPulse.com.au  Fact Sheet for Healthcare  Providers: IncredibleEmployment.be  This test is not yet approved or cleared by the [place] FDA and has been authorized for detection and/or diagnosis of SARS-CoV-2 by FDA under an Emergency Use Authorization (EUA). This EUA will remain in effect (meaning this test can be used) for the duration of the COVID-19 declaration under Section 564(b)(1) of the Act, 21 U.S.C. section 360bbb-3(b)(1), unless the authorization is terminated or revoked.  Performed at [facility], [address] [place], [place] [address]      Studies: DG Chest 2 View  Result Date: 10/17/2020 CLINICAL DATA:  Altered mental status.  Overdose. EXAM: CHEST - 2 VIEW COMPARISON:  06/03/2018. FINDINGS: The heart size and mediastinal contours are within normal limits. Both lungs are clear. No visible pleural effusions or pneumothorax. No acute osseous abnormality. Cholecystectomy clips. IMPRESSION: No active cardiopulmonary disease. Electronically Signed   By: Margaretha Sheffield MD   On: 10/17/2020 13:05    Scheduled Meds: . amLODipine  5 mg Oral Daily  . aspirin EC  81 mg Oral Daily  . citalopram  20 mg Oral Daily  . enoxaparin (LOVENOX) injection  40 mg Subcutaneous Q24H  . feeding supplement  237 mL Oral BID BM  . folic acid  1 mg Oral Daily  . lidocaine  1 application Urethral Once  . LORazepam  0.25 mg Oral Daily  . melatonin  2.5 mg Oral QHS  . mometasone  1 application Topical Daily  . multivitamin with minerals  1 tablet Oral Daily  . nicotine  21 mg Transdermal Q24H  . QUEtiapine  200 mg Oral QHS  . tamsulosin  0.4 mg Oral Daily  . traZODone  150 mg Oral QHS    Assessment/Plan:   1. Acute metabolic encephalopathy.  Polypharmacy and taking too much medications at home and dehydration likely played a role in this.  Continue to hold Wellbutrin, muscle relaxants, tramadol and Vistaril.  During the hospital course for the first 2 days patient was not able to communicate  very well and is very impulsive.  Mental status much better than when he came in.  Able to communicate better. 2. Severe depression with history of bipolar disorder.  On low-dose Celexa, low-dose lorazepam.  Seroquel increased to 200 mg nightly and trazodone 150 mg nightly.  Seen by Dr. Weber Cooks psychiatry and he recommended inpatient psychiatric treatment.  Patient will be [facility] to [facility] psychiatric unit when bed available.  Medically cleared to go.  Still waiting for urine toxicology and urine analysis.  Chest x-ray negative without signs of tuberculosis and Covid test negative 3. Weakness.  Patient has been gradually improving with physical therapy.  Patient was able to walk 100 feet with a rolling walker around the nursing station today.  Continue ambulating.  I will hold atorvastatin today 4. Hyperlipidemia unspecified.  The patient is complaining of some leg pain and weakness.  I will hold the atorvastatin for at least a week. 5. Acute kidney injury.  This has resolved.  Creatinine 1.3 on presentation down to 0.6 as of 10/14/2020. 6. 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 BPH on Flomax 8. Essential hypertension on Norvasc 9. Patient had a positive urinalysis initially and given 3 days of Rocephin but urine culture was negative so antibiotics were discontinued.       Code Status:     Code Status Orders  (From admission, onward)         Start     Ordered   10/10/20 1238  Full code  Continuous        10/10/20 1239        Code Status History    Date Active Date Inactive Code Status Order ID Comments User Context   03/12/2018 2017 03/13/2018 2110 Full Code [id number]  Houston Siren, MD Inpatient   07/28/2012 1508 07/29/2012 1324 Full Code [id number]  Renne Crigler, PA ED   Advance Care Planning Activity     Family Communication: Spoke with daughter on the phone Disposition Plan: Status is: Inpatient   Dispo: The patient is from: Home              Anticipated d/c is to:  Psychiatric unit              Anticipated d/c date is: Potential 10/17/2020 versus 10/18/2020              Patient currently medically stable to discharge to psychiatric unit  Time spent: 35 minutes  Jemaine [employer]

## 2020-10-17 NOTE — TOC Progression Note (Signed)
Transition of Care [facility]) - Progression Note    Patient Details  Name: [name] MRN: [id number] Date of Birth: 10-Jan-1946  Transition of Care [facility]) CM/SW Contact   Cellar, RN Phone Number: 10/17/2020, 10:20 AM  Clinical Narrative:    Received call from [facility] requesting peer to peer from attending to discuss potential SNF placement. Requested call to [phone] option 5. Deadline for peer to peer is 2pm today.   Updated plan has changed to Inpatient psych.    Expected Discharge Plan: Skilled Nursing Facility Barriers to Discharge: Continued Medical Work up  Expected Discharge Plan and Services Expected Discharge Plan: Skilled Nursing Facility       Living arrangements for the past 2 months: Single Family Home                                       Social Determinants of Health (SDOH) Interventions    Readmission Risk Interventions No flowsheet data found.

## 2020-10-17 NOTE — TOC Progression Note (Addendum)
Transition of Care [facility]) - Progression Note    Patient Details  Name: ZEPLIN ALESHIRE MRN: [id number] Date of Birth: 12/22/1945  Transition of Care [facility]) CM/SW Harrod, LCSW Phone Number: 10/17/2020, 9:29 AM  Clinical Narrative:   CSW informed by MD that now the plan is for Inpatient Psych at [facility] or [facility], per MD Psychiatrist already sent out patient's information. MD asked CSW to call [place] to check on status of referral. CSW called and spoke with Joella in Intake. Joella confirmed she received the referral and reported she will call CSW back after she discusses referral with their MD.  Updated Ebony Hail at [facility] of plan now being for Inpatient Psych.    3:50- CSW informed by Dr. Weber Cooks that [facility] can take patient for Inpatient Psych, needs additional info to be faxed over. CSW met with patient and wife at bedside. Provided update. CSW had patient sign Voluntary Admission and Consent for Treatment Form per Dr. Weber Cooks. Faxed information needed to [facility] Admissions.   Expected Discharge Plan: [place] Barriers to Discharge: Continued Medical Work up  Expected Discharge Plan and Services Expected Discharge Plan: [place] arrangements for the past 2 months: Single Family Home                                       Social Determinants of Health (SDOH) Interventions    Readmission Risk Interventions No flowsheet data found.

## 2020-10-17 NOTE — Progress Notes (Signed)
Physical Therapy Treatment Patient Details Name: [name] MRN: [id number] DOB: 1946/02/08 Today's Date: 10/17/2020    History of Present Illness presented to ER secondary to AMS, decreased responsiveness, unsteady gait, L flank/back pain; admitted for management of acute metabolic encephalopathy and complicated UTI.    PT Comments    Ready for session.  Stood with supervision and is able to complete lap around nursing unit.  100' with RW and 100' without AD as pt stated plan is to go to Psych unit downstairs.  Walkers are typically discouraged for safety reasons on unit.  While he reports he feels more comfortable with walker, he has no LOB or episodes that require assist.  Short shuffling steps and appears to have good awareness.    Pt is progressing with mobility and should be able to manage mobility on psych unit with supervision for safety without AD.     Follow Up Recommendations  SNF;Other (comment)     Equipment Recommendations  Rolling walker with 5" wheels    Recommendations for Other Services       Precautions / Restrictions Precautions Precautions: Fall Restrictions Weight Bearing Restrictions: No    Mobility  Bed Mobility                  Transfers Overall transfer level: Needs assistance Equipment used: Rolling walker (2 wheeled);None Transfers: Sit to/from Stand Sit to Stand: Supervision            Ambulation/Gait Ambulation/Gait assistance: Supervision;Min guard Gait Distance (Feet): 200 Feet Assistive device: Rolling walker (2 wheeled);None   Gait velocity: reduced   General Gait Details: gait with and without walker slow short steps but no LOB   Stairs             Wheelchair Mobility    Modified Rankin (Stroke Patients Only)       Balance Overall balance assessment: Needs assistance Sitting-balance support: No upper extremity supported;Feet supported Sitting balance-Leahy Scale: Good     Standing balance  support: Bilateral upper extremity supported Standing balance-Leahy Scale: Good                              Cognition Arousal/Alertness: Awake/alert Behavior During Therapy: WFL for tasks assessed/performed Overall Cognitive Status: History of cognitive impairments - at baseline                                        Exercises      General Comments        Pertinent Vitals/Pain Pain Assessment: No/denies pain    Home Living                      Prior Function            PT Goals (current goals can now be found in the care plan section) Progress towards PT goals: Progressing toward goals    Frequency    Min 2X/week      PT Plan Current plan remains appropriate    Co-evaluation              AM-PAC PT "6 Clicks" Mobility   Outcome Measure  Help needed turning from your back to your side while in a flat bed without using bedrails?: A Little Help needed moving from lying on your back to sitting on the  side of a flat bed without using bedrails?: A Little Help needed moving to and from a bed to a chair (including a wheelchair)?: A Little Help needed standing up from a chair using your arms (e.g., wheelchair or bedside chair)?: None Help needed to walk in hospital room?: A Little Help needed climbing 3-5 steps with a railing? : A Little 6 Click Score: 19    End of Session Equipment Utilized During Treatment: Gait belt Activity Tolerance: Patient tolerated treatment well Patient left: in chair;with call bell/phone within reach;with chair alarm set Nurse Communication: Mobility status       Time: [id number] PT Time Calculation (min) (ACUTE ONLY): 8 min  Charges:  $Gait Training: 8-22 mins                    Chesley Noon, PTA 10/17/20, 11:31 AM'

## 2020-10-18 DIAGNOSIS — T424X2D Poisoning by benzodiazepines, intentional self-harm, subsequent encounter: Secondary | ICD-10-CM

## 2020-10-18 MED ORDER — MENTHOL 3 MG MT LOZG
1.0000 | LOZENGE | OROMUCOSAL | Status: DC | PRN
  Filled 2020-10-18: qty 9

## 2020-10-18 MED ORDER — MENTHOL 3 MG MT LOZG: 1.0000 | LOZENGE | OROMUCOSAL | 12 refills | Status: DC | PRN

## 2020-10-18 NOTE — Progress Notes (Signed)
Per Lynnae Prude, LCSW, pt's daughter, [name] is aware of pt's discharge to outside facility.

## 2020-10-18 NOTE — Progress Notes (Signed)
  Patient seen and examined: He is resting comfortably on the bedside chair alert awake oriented.  Aware and agreeable with  disposition inpatient psych today. Urine toxicology and urine analysis and chest x-ray and Covid test were done as part of preadmission for [facility] inpatient psych and reviewed only positive for TCA in UDS-, chest x-ray no acute finding,  Reviewed dc summary done on 10/17/20 and no updates needed. Patient admitted with acute metabolic encephalopathy/polypharmacy and will need inpatient psychiatric admission. Advised routine follow-up TSH in 6 to 8 weeks by primary care physician , it was 4.5 here. Lipitor is held due to weakness 4-week and resume at home dose.  On Seroquel  Increased to200 mg at nightly, on Celexa daily, on trazodone, and ativan 0.5 mg daily. Discharge date 10/18/2020.

## 2020-10-18 NOTE — Plan of Care (Signed)

## 2020-10-18 NOTE — Progress Notes (Signed)
Report called to Shanda Bumps at [facility] inpatient facility.

## 2020-10-18 NOTE — TOC Transition Note (Addendum)
Transition of Care [facility]) - CM/SW Discharge Note   Patient Details  Name: [name] MRN: [id number] Date of Birth: 1946/03/26  Transition of Care [facility]) CM/SW Contact:  Cameron Cline, [name] Phone Number: 10/18/2020, 10:26 AM   Clinical Narrative:   Patient accepted at [facility] in their Geri Psych Unit. CSW spoke with [name] at [place] to confirm. Updated patient at bedside and had patient sign Engineer, production. Informed patient that per [name], patient can bring clothing without strings if he would like, but they also have clothing there he can use. CSW asked RN to call report and MD to complete EMTALA. CSW will call [facility] Transport after report is called and EMTALA is complete.  Patient has been accepted to [facility] in their [facility] Psych Unit.   Patient assigned to Room 400 Bed B   Accepting physician is Dr. Joseph Art   Call report to [name] at [phone]   Representative was [name]     1A Staff is aware of it:   Dr. Lequita Asal MD   [name], Patient's Nurse    Patient's Family/Support System [facility]) has been updated via voicemail as well.   12:05- CSW did not receive a return call from [name], called daughter [name] and informed her of plan. She reported she is unsure if patient understands he is going for Inpatient Psych. CSW went to bedside again and explained to patient he is going for Inpatient Psych treatment for his depression. He said "I know" and verbalized understanding that he is going to [facility] for Inpatient Psych today to treat his depression. Updated [name] via phone.  CSW called [facility], arranged transport with [employer]. Confirmed with [name] at [place] and informed [name] for transport come through ED entrance at [address] [place], [place].   Final next level of care: Other (comment) (Inpatient Psych) Barriers to Discharge: Barriers  Resolved   Patient Goals and CMS Choice Patient states their goals for this hospitalization and ongoing recovery are:: inpatient psych CMS Medicare.gov Compare Post Acute Care list provided to:: Patient Choice offered to / list presented to : [facility]  Discharge Placement                Patient to be transferred to facility by: [facility] Name of family member notified: Patient notified at bedside. Patient and family notified of of transfer: 10/18/20  Discharge Plan and Services                                     Social Determinants of Health (SDOH) Interventions     Readmission Risk Interventions No flowsheet data found.

## 2020-11-11 IMAGING — US US RENAL
1 series · 14 of 25 positions shown · non-contrast
Comparison: CT abdomen and pelvis 06/24/2018

CLINICAL DATA: Nephrolithiasis, history hypertension, stroke

EXAM:
RENAL / URINARY TRACT ULTRASOUND COMPLETE

[Series 1: us renal · 0.23mm/px · 14 of 36 slices shown]
[im 1/36]
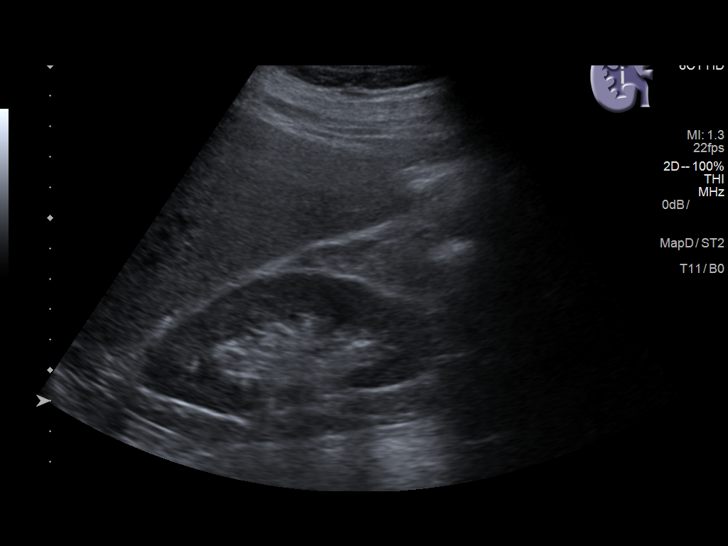
[im 3/36]
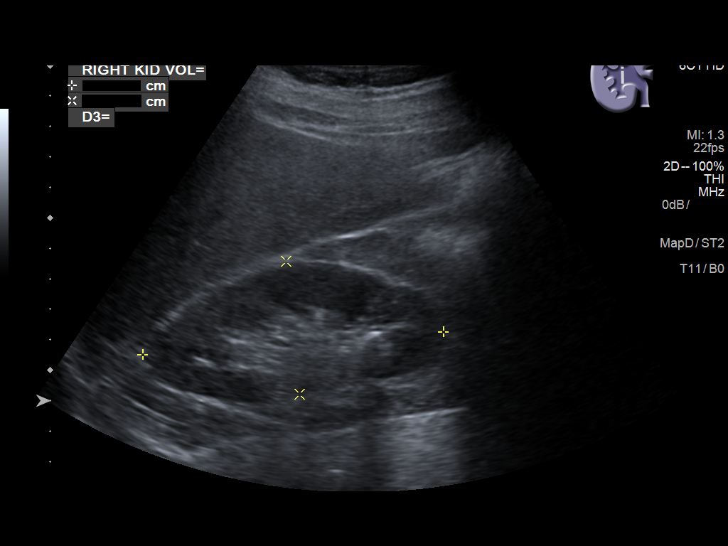
[im 6/36]
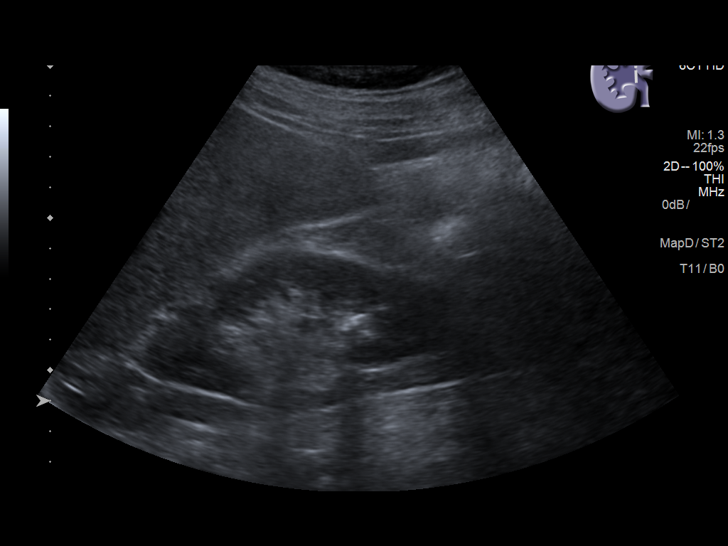
[im 9/36]
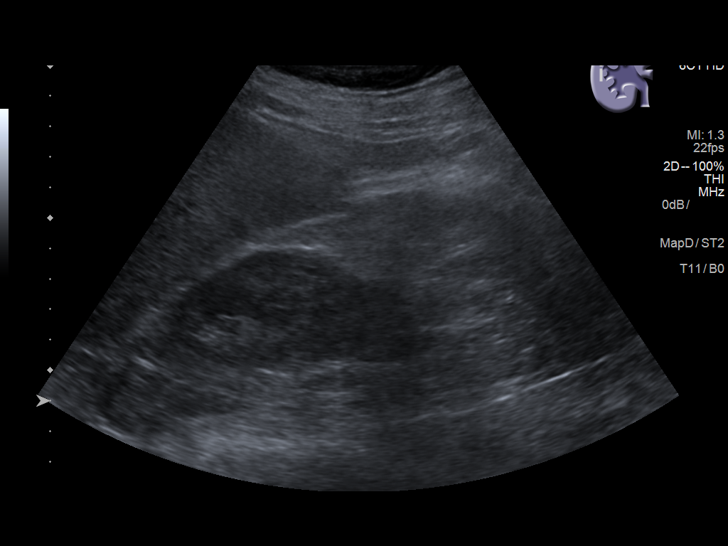
[im 12/36]
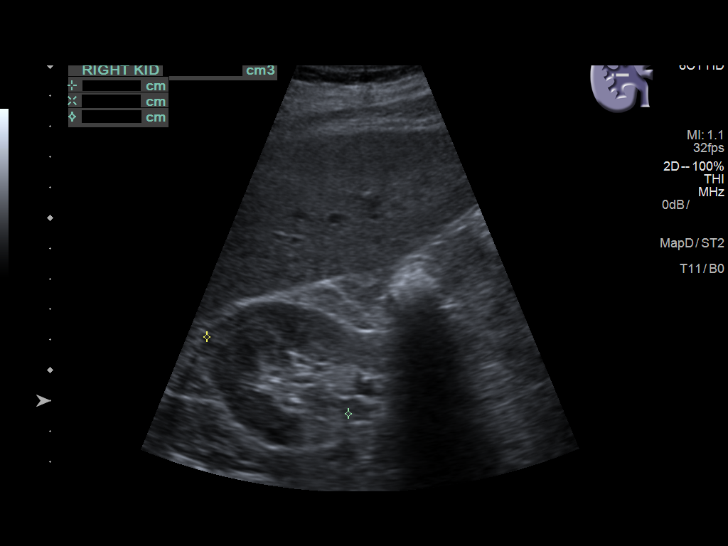
[im 14/36]
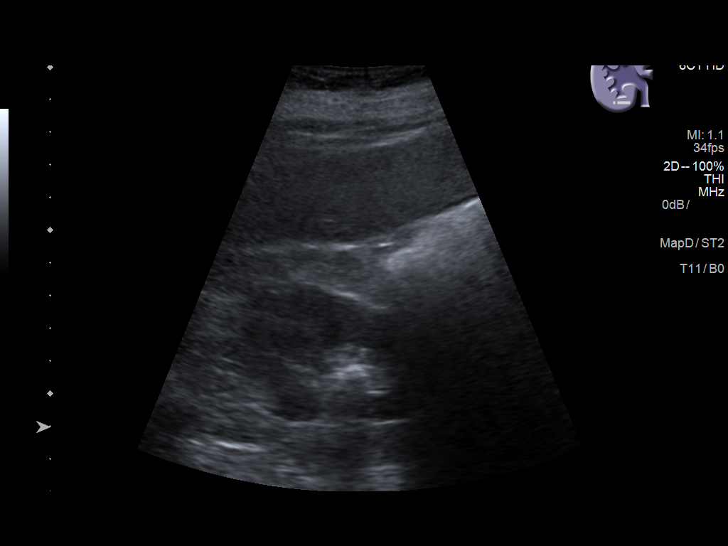
[im 17/36]
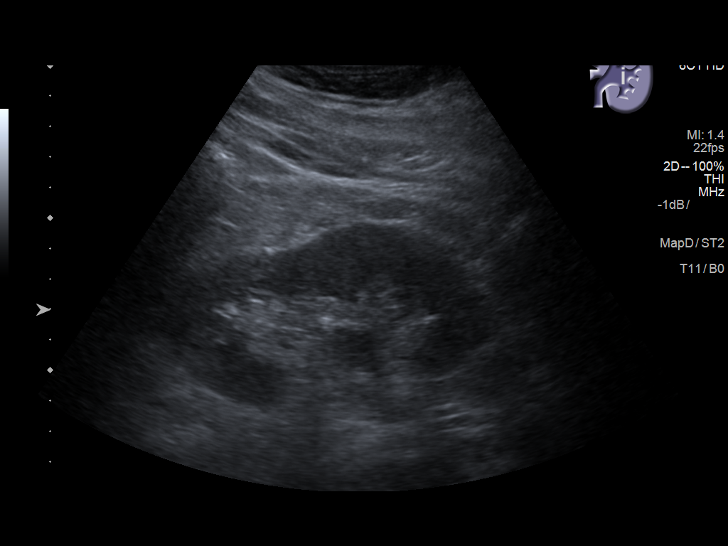
[im 19/36]
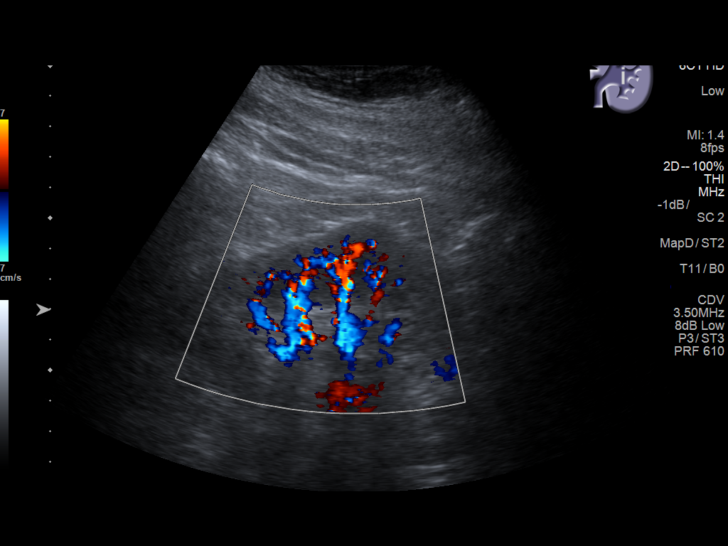
[im 22/36]
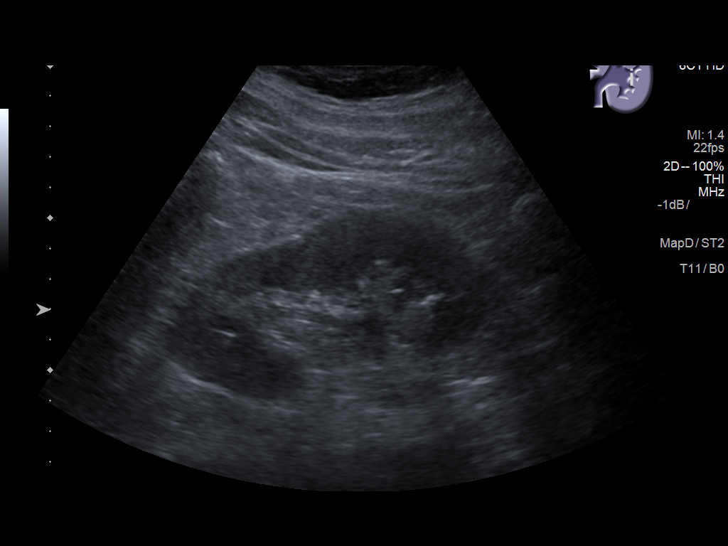
[im 24/36]
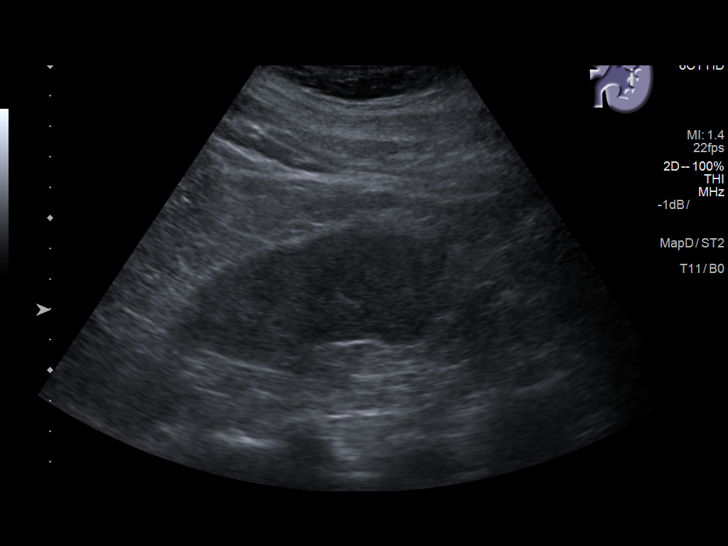
[im 27/36]
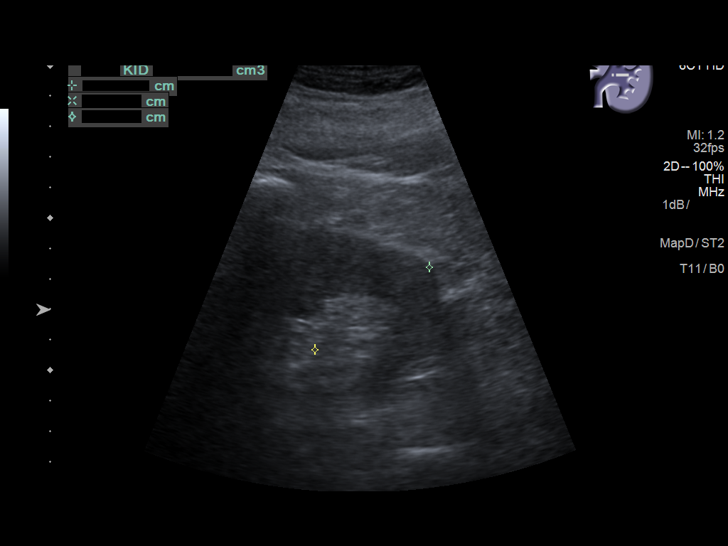
[im 30/36]
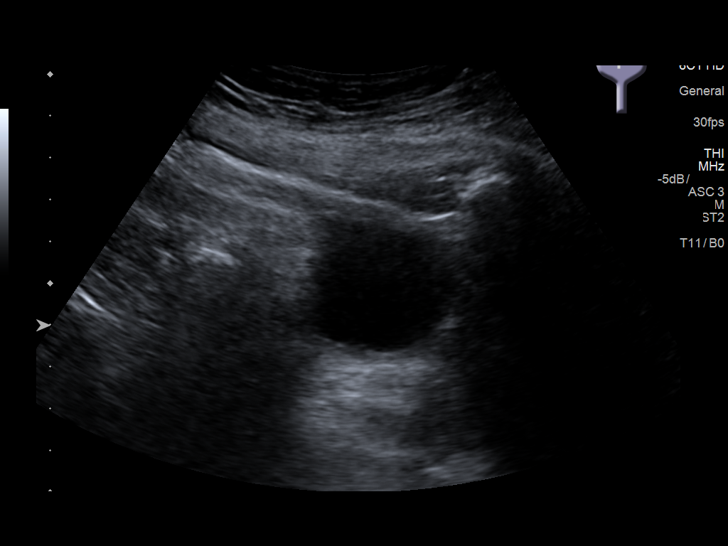
[im 33/36]
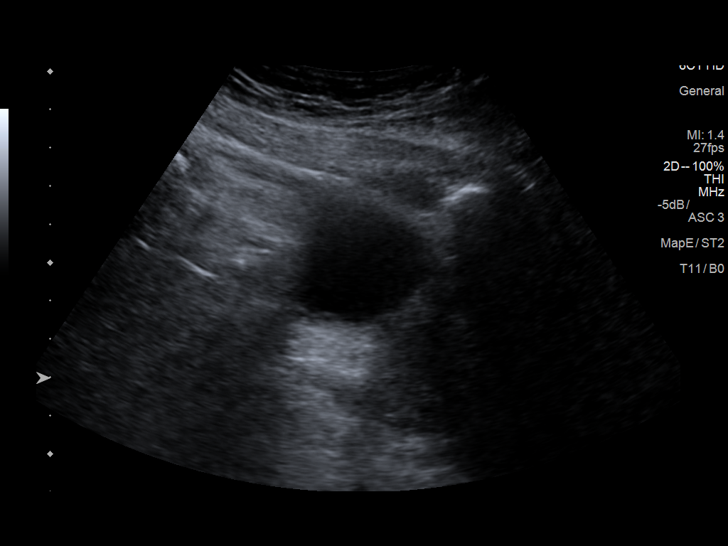
[im 36/36]
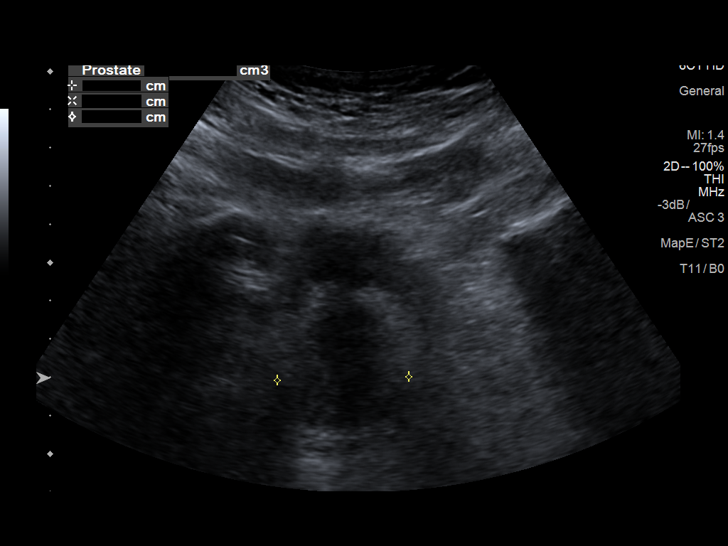

[14 of 25 positions shown; findings below may reference images not displayed]

FINDINGS: Right Kidney:

Renal measurements: 9.9 x 4.4 x 5.3 cm = volume: 120.2 mL. Normal
cortical thickness. Upper normal cortical echogenicity. No mass or
hydronephrosis. 10 mm shadowing echogenic focus likely a
nonobstructing calculus at inferior pole.

Left Kidney:

Renal measurements: 10.8 x 5.2 x 4.6 cm = volume: 136.0 mL. Normal
cortical thickness. Upper normal cortical echogenicity. No mass,
hydronephrosis or shadowing calcification.

Bladder:

Partially distended, grossly unremarkable.

Prostate measures 3.7 x 3.6 x 3.4 cm with a calculated volume of
23.6 mL.
IMPRESSION: 10 mm nonobstructing calculus at inferior pole RIGHT kidney.

Otherwise unremarkable renal ultrasound.

Minimal prostatic enlargement.

## 2020-11-21 DIAGNOSIS — E1151 Type 2 diabetes mellitus with diabetic peripheral angiopathy without gangrene: Secondary | ICD-10-CM | POA: Diagnosis not present

## 2020-11-21 DIAGNOSIS — M4727 Other spondylosis with radiculopathy, lumbosacral region: Secondary | ICD-10-CM | POA: Diagnosis not present

## 2020-11-21 DIAGNOSIS — M5116 Intervertebral disc disorders with radiculopathy, lumbar region: Secondary | ICD-10-CM | POA: Diagnosis not present

## 2020-11-21 DIAGNOSIS — F0150 Vascular dementia without behavioral disturbance: Secondary | ICD-10-CM | POA: Diagnosis not present

## 2020-11-21 DIAGNOSIS — F0280 Dementia in other diseases classified elsewhere without behavioral disturbance: Secondary | ICD-10-CM | POA: Diagnosis not present

## 2020-11-21 DIAGNOSIS — G301 Alzheimer's disease with late onset: Secondary | ICD-10-CM | POA: Diagnosis not present

## 2020-11-21 DIAGNOSIS — B182 Chronic viral hepatitis C: Secondary | ICD-10-CM | POA: Diagnosis not present

## 2020-11-21 DIAGNOSIS — F319 Bipolar disorder, unspecified: Secondary | ICD-10-CM | POA: Diagnosis not present

## 2020-11-21 DIAGNOSIS — I10 Essential (primary) hypertension: Secondary | ICD-10-CM | POA: Diagnosis not present

## 2020-11-27 DIAGNOSIS — E1151 Type 2 diabetes mellitus with diabetic peripheral angiopathy without gangrene: Secondary | ICD-10-CM | POA: Diagnosis not present

## 2020-11-27 DIAGNOSIS — F0150 Vascular dementia without behavioral disturbance: Secondary | ICD-10-CM | POA: Diagnosis not present

## 2020-11-27 DIAGNOSIS — M4727 Other spondylosis with radiculopathy, lumbosacral region: Secondary | ICD-10-CM | POA: Diagnosis not present

## 2020-11-27 DIAGNOSIS — F319 Bipolar disorder, unspecified: Secondary | ICD-10-CM | POA: Diagnosis not present

## 2020-11-27 DIAGNOSIS — M5116 Intervertebral disc disorders with radiculopathy, lumbar region: Secondary | ICD-10-CM | POA: Diagnosis not present

## 2020-11-27 DIAGNOSIS — I10 Essential (primary) hypertension: Secondary | ICD-10-CM | POA: Diagnosis not present

## 2020-11-27 DIAGNOSIS — F0280 Dementia in other diseases classified elsewhere without behavioral disturbance: Secondary | ICD-10-CM | POA: Diagnosis not present

## 2020-11-27 DIAGNOSIS — B182 Chronic viral hepatitis C: Secondary | ICD-10-CM | POA: Diagnosis not present

## 2020-11-27 DIAGNOSIS — G301 Alzheimer's disease with late onset: Secondary | ICD-10-CM | POA: Diagnosis not present

## 2020-11-29 DIAGNOSIS — F0150 Vascular dementia without behavioral disturbance: Secondary | ICD-10-CM | POA: Diagnosis not present

## 2020-11-29 DIAGNOSIS — E1151 Type 2 diabetes mellitus with diabetic peripheral angiopathy without gangrene: Secondary | ICD-10-CM | POA: Diagnosis not present

## 2020-11-29 DIAGNOSIS — G301 Alzheimer's disease with late onset: Secondary | ICD-10-CM | POA: Diagnosis not present

## 2020-11-29 DIAGNOSIS — F0280 Dementia in other diseases classified elsewhere without behavioral disturbance: Secondary | ICD-10-CM | POA: Diagnosis not present

## 2020-11-29 DIAGNOSIS — B182 Chronic viral hepatitis C: Secondary | ICD-10-CM | POA: Diagnosis not present

## 2020-11-29 DIAGNOSIS — I10 Essential (primary) hypertension: Secondary | ICD-10-CM | POA: Diagnosis not present

## 2020-11-29 DIAGNOSIS — F319 Bipolar disorder, unspecified: Secondary | ICD-10-CM | POA: Diagnosis not present

## 2020-12-04 DIAGNOSIS — F0150 Vascular dementia without behavioral disturbance: Secondary | ICD-10-CM | POA: Diagnosis not present

## 2020-12-04 DIAGNOSIS — F0280 Dementia in other diseases classified elsewhere without behavioral disturbance: Secondary | ICD-10-CM | POA: Diagnosis not present

## 2020-12-04 DIAGNOSIS — E1151 Type 2 diabetes mellitus with diabetic peripheral angiopathy without gangrene: Secondary | ICD-10-CM | POA: Diagnosis not present

## 2020-12-04 DIAGNOSIS — M4727 Other spondylosis with radiculopathy, lumbosacral region: Secondary | ICD-10-CM | POA: Diagnosis not present

## 2020-12-04 DIAGNOSIS — F319 Bipolar disorder, unspecified: Secondary | ICD-10-CM | POA: Diagnosis not present

## 2020-12-04 DIAGNOSIS — G301 Alzheimer's disease with late onset: Secondary | ICD-10-CM | POA: Diagnosis not present

## 2020-12-04 DIAGNOSIS — I10 Essential (primary) hypertension: Secondary | ICD-10-CM | POA: Diagnosis not present

## 2020-12-04 DIAGNOSIS — M5116 Intervertebral disc disorders with radiculopathy, lumbar region: Secondary | ICD-10-CM | POA: Diagnosis not present

## 2020-12-04 DIAGNOSIS — B182 Chronic viral hepatitis C: Secondary | ICD-10-CM | POA: Diagnosis not present

## 2020-12-14 DIAGNOSIS — F0280 Dementia in other diseases classified elsewhere without behavioral disturbance: Secondary | ICD-10-CM | POA: Diagnosis not present

## 2020-12-14 DIAGNOSIS — B182 Chronic viral hepatitis C: Secondary | ICD-10-CM | POA: Diagnosis not present

## 2020-12-14 DIAGNOSIS — E1151 Type 2 diabetes mellitus with diabetic peripheral angiopathy without gangrene: Secondary | ICD-10-CM | POA: Diagnosis not present

## 2020-12-14 DIAGNOSIS — M4727 Other spondylosis with radiculopathy, lumbosacral region: Secondary | ICD-10-CM | POA: Diagnosis not present

## 2020-12-14 DIAGNOSIS — I10 Essential (primary) hypertension: Secondary | ICD-10-CM | POA: Diagnosis not present

## 2020-12-14 DIAGNOSIS — F319 Bipolar disorder, unspecified: Secondary | ICD-10-CM | POA: Diagnosis not present

## 2020-12-14 DIAGNOSIS — G301 Alzheimer's disease with late onset: Secondary | ICD-10-CM | POA: Diagnosis not present

## 2020-12-14 DIAGNOSIS — M5116 Intervertebral disc disorders with radiculopathy, lumbar region: Secondary | ICD-10-CM | POA: Diagnosis not present

## 2020-12-14 DIAGNOSIS — F0150 Vascular dementia without behavioral disturbance: Secondary | ICD-10-CM | POA: Diagnosis not present

## 2020-12-20 DIAGNOSIS — H2512 Age-related nuclear cataract, left eye: Secondary | ICD-10-CM | POA: Diagnosis not present

## 2020-12-20 DIAGNOSIS — I10 Essential (primary) hypertension: Secondary | ICD-10-CM | POA: Diagnosis not present

## 2020-12-22 DIAGNOSIS — G301 Alzheimer's disease with late onset: Secondary | ICD-10-CM | POA: Diagnosis not present

## 2020-12-22 DIAGNOSIS — B182 Chronic viral hepatitis C: Secondary | ICD-10-CM | POA: Diagnosis not present

## 2020-12-22 DIAGNOSIS — F0150 Vascular dementia without behavioral disturbance: Secondary | ICD-10-CM | POA: Diagnosis not present

## 2020-12-22 DIAGNOSIS — F319 Bipolar disorder, unspecified: Secondary | ICD-10-CM | POA: Diagnosis not present

## 2020-12-22 DIAGNOSIS — E1151 Type 2 diabetes mellitus with diabetic peripheral angiopathy without gangrene: Secondary | ICD-10-CM | POA: Diagnosis not present

## 2020-12-22 DIAGNOSIS — F0280 Dementia in other diseases classified elsewhere without behavioral disturbance: Secondary | ICD-10-CM | POA: Diagnosis not present

## 2020-12-22 DIAGNOSIS — M5116 Intervertebral disc disorders with radiculopathy, lumbar region: Secondary | ICD-10-CM | POA: Diagnosis not present

## 2020-12-22 DIAGNOSIS — M4727 Other spondylosis with radiculopathy, lumbosacral region: Secondary | ICD-10-CM | POA: Diagnosis not present

## 2020-12-22 DIAGNOSIS — I10 Essential (primary) hypertension: Secondary | ICD-10-CM | POA: Diagnosis not present

## 2020-12-25 ENCOUNTER — Other Ambulatory Visit: Payer: Self-pay

## 2020-12-25 ENCOUNTER — Encounter: Payer: Self-pay | Admitting: Ophthalmology

## 2020-12-28 ENCOUNTER — Other Ambulatory Visit
Admission: RE | Admit: 2020-12-28 | Discharge: 2020-12-28 | Disposition: A | Discharge status: Home / Self Care | Payer: Medicare HMO | Source: Ambulatory Visit | Attending: Ophthalmology | Admitting: Ophthalmology

## 2020-12-28 ENCOUNTER — Other Ambulatory Visit: Payer: Self-pay

## 2020-12-28 DIAGNOSIS — Z01812 Encounter for preprocedural laboratory examination: Secondary | ICD-10-CM | POA: Diagnosis not present

## 2020-12-28 DIAGNOSIS — Z20822 Contact with and (suspected) exposure to covid-19: Secondary | ICD-10-CM | POA: Insufficient documentation

## 2020-12-28 LAB — SARS CORONAVIRUS 2 (TAT 6-24 HRS): SARS Coronavirus 2: NEGATIVE

## 2020-12-28 NOTE — Discharge Instructions (Signed)

## 2021-01-01 ENCOUNTER — Ambulatory Visit: Payer: Medicare HMO | Admitting: Anesthesiology

## 2021-01-01 ENCOUNTER — Encounter: Payer: Self-pay | Admitting: Ophthalmology

## 2021-01-01 ENCOUNTER — Other Ambulatory Visit: Payer: Self-pay

## 2021-01-01 ENCOUNTER — Ambulatory Visit
Admission: RE | Admit: 2021-01-01 | Discharge: 2021-01-01 | Disposition: A | Discharge status: Home / Self Care | Payer: Medicare HMO | Attending: Ophthalmology | Admitting: Ophthalmology

## 2021-01-01 ENCOUNTER — Encounter
Admission: RE | Disposition: A | Discharge status: Home / Self Care | Payer: Self-pay | Source: Home / Self Care | Attending: Ophthalmology

## 2021-01-01 DIAGNOSIS — Z7982 Long term (current) use of aspirin: Secondary | ICD-10-CM | POA: Diagnosis not present

## 2021-01-01 DIAGNOSIS — Z79899 Other long term (current) drug therapy: Secondary | ICD-10-CM | POA: Diagnosis not present

## 2021-01-01 DIAGNOSIS — Z8616 Personal history of COVID-19: Secondary | ICD-10-CM | POA: Diagnosis not present

## 2021-01-01 DIAGNOSIS — H2512 Age-related nuclear cataract, left eye: Secondary | ICD-10-CM | POA: Diagnosis not present

## 2021-01-01 DIAGNOSIS — H25812 Combined forms of age-related cataract, left eye: Secondary | ICD-10-CM | POA: Diagnosis not present

## 2021-01-01 DIAGNOSIS — F17210 Nicotine dependence, cigarettes, uncomplicated: Secondary | ICD-10-CM | POA: Diagnosis not present

## 2021-01-01 HISTORY — PX: CATARACT EXTRACTION W/PHACO: SHX586

## 2021-01-01 HISTORY — DX: Presence of dental prosthetic device (complete) (partial): Z97.2

## 2021-01-01 SURGERY — PHACOEMULSIFICATION, CATARACT, WITH IOL INSERTION
Anesthesia: Monitor Anesthesia Care | Site: Eye | Laterality: Left

## 2021-01-01 MED ORDER — SODIUM HYALURONATE 23 MG/ML IO SOLN
INTRAOCULAR | Status: DC | PRN
  Administered 2021-01-01: 0.6 mL via INTRAOCULAR

## 2021-01-01 MED ORDER — EPINEPHRINE PF 1 MG/ML IJ SOLN
INTRAOCULAR | Status: DC | PRN
  Administered 2021-01-01: 74 mL via OPHTHALMIC

## 2021-01-01 MED ORDER — ONDANSETRON HCL 4 MG/2ML IJ SOLN: 4.0000 mg | Freq: Once | INTRAMUSCULAR | Status: DC | PRN

## 2021-01-01 MED ORDER — MOXIFLOXACIN HCL 0.5 % OP SOLN
OPHTHALMIC | Status: DC | PRN
  Administered 2021-01-01: 0.2 mL via OPHTHALMIC

## 2021-01-01 MED ORDER — LIDOCAINE HCL (PF) 2 % IJ SOLN
INTRAOCULAR | Status: DC | PRN
  Administered 2021-01-01: 1 mL via INTRAOCULAR

## 2021-01-01 MED ORDER — ACETAMINOPHEN 160 MG/5ML PO SOLN: 325.0000 mg | ORAL | Status: DC | PRN

## 2021-01-01 MED ORDER — TETRACAINE HCL 0.5 % OP SOLN
1.0000 [drp] | OPHTHALMIC | Status: DC | PRN
  Administered 2021-01-01 (×3): 1 [drp] via OPHTHALMIC

## 2021-01-01 MED ORDER — SODIUM HYALURONATE 10 MG/ML IO SOLN
INTRAOCULAR | Status: DC | PRN
  Administered 2021-01-01: 0.55 mL via INTRAOCULAR

## 2021-01-01 MED ORDER — FENTANYL CITRATE (PF) 100 MCG/2ML IJ SOLN
INTRAMUSCULAR | Status: DC | PRN
  Administered 2021-01-01: 50 ug via INTRAVENOUS

## 2021-01-01 MED ORDER — LACTATED RINGERS IV SOLN: INTRAVENOUS | Status: DC

## 2021-01-01 MED ORDER — ACETAMINOPHEN 325 MG PO TABS: 325.0000 mg | ORAL_TABLET | ORAL | Status: DC | PRN

## 2021-01-01 MED ORDER — MIDAZOLAM HCL 2 MG/2ML IJ SOLN
INTRAMUSCULAR | Status: DC | PRN
  Administered 2021-01-01: 1 mg via INTRAVENOUS

## 2021-01-01 MED ORDER — ARMC OPHTHALMIC DILATING DROPS
1.0000 "application " | OPHTHALMIC | Status: DC | PRN
  Administered 2021-01-01 (×3): 1 via OPHTHALMIC

## 2021-01-01 SURGICAL SUPPLY — 19 items
CANNULA ANT/CHMB 27G (MISCELLANEOUS) ×2 IMPLANT
CANNULA ANT/CHMB 27GA (MISCELLANEOUS) ×4 IMPLANT
DISSECTOR HYDRO NUCLEUS 50X22 (MISCELLANEOUS) ×2 IMPLANT
GLOVE SURG LX 7.5 STRW (GLOVE) ×1
GLOVE SURG LX STRL 7.5 STRW (GLOVE) ×1 IMPLANT
GLOVE SURG SYN 8.5  E (GLOVE) ×1
GLOVE SURG SYN 8.5 E (GLOVE) ×1 IMPLANT
GLOVE SURG SYN 8.5 PF PI (GLOVE) ×1 IMPLANT
GOWN STRL REUS W/ TWL LRG LVL3 (GOWN DISPOSABLE) ×2 IMPLANT
GOWN STRL REUS W/TWL LRG LVL3 (GOWN DISPOSABLE) ×4
LENS IOL TECNIS EYHANCE 15.5 (Intraocular Lens) ×1 IMPLANT
MARKER SKIN DUAL TIP RULER LAB (MISCELLANEOUS) ×2 IMPLANT
PACK DR. KING ARMS (PACKS) ×2 IMPLANT
PACK EYE AFTER SURG (MISCELLANEOUS) ×2 IMPLANT
PACK OPTHALMIC (MISCELLANEOUS) ×2 IMPLANT
SYR 3ML LL SCALE MARK (SYRINGE) ×2 IMPLANT
SYR TB 1ML LUER SLIP (SYRINGE) ×2 IMPLANT
WATER STERILE IRR 250ML POUR (IV SOLUTION) ×2 IMPLANT
WIPE NON LINTING 3.25X3.25 (MISCELLANEOUS) ×2 IMPLANT

## 2021-01-01 NOTE — H&P (Signed)
[place]   Primary Care Physician:  Tracie Harrier, MD Ophthalmologist: Dr. Benay Pillow  Pre-Procedure History & Physical: HPI:  [name] is a 75 y.o. male here for cataract surgery.   Past Medical History:  Diagnosis Date  . Anxiety   . Arthritis    DDD, lumbar radiculopathy  . Bell's palsy    hx of, right side  . Bell's palsy   . Chronic back pain    followed by pain clinic at [facility]  . Depression    hx of depression  . GERD (gastroesophageal reflux disease)   . Hepatitis    hx of hepatitis C treated with Harvoni per patient  . History of kidney stones   . Hypertension   . Kidney stone    hx of  . Pre-diabetes   . Stroke ([place]) 02/2018   loss of peripheral vision and balance unsteady  . Substance abuse ([place])    heroin  . Wears dentures    Has full upper.  Does not wear.    Past Surgical History:  Procedure Laterality Date  . ANTERIOR LUMBAR FUSION  05/13/2012   Procedure: ANTERIOR LUMBAR FUSION 1 LEVEL;  Surgeon: Melina Schools, MD;  Location: [place];  Service: Orthopedics;  Laterality: N/A;  ALIF L5-S1  . CHOLECYSTECTOMY    . CYSTOSCOPY W/ RETROGRADES Left 08/04/2018   Procedure: CYSTOSCOPY WITH RETROGRADE PYELOGRAM;  Surgeon: Abbie Sons, MD;  Location: [facility];  Service: Urology;  Laterality: Left;  . CYSTOSCOPY WITH INSERTION OF UROLIFT N/A 01/18/2020   Procedure: CYSTOSCOPY WITH INSERTION OF UROLIFT;  Surgeon: Abbie Sons, MD;  Location: [facility];  Service: Urology;  Laterality: N/A;  . CYSTOSCOPY WITH STENT PLACEMENT Left 08/04/2018   Procedure: CYSTOSCOPY WITH STENT PLACEMENT;  Surgeon: Abbie Sons, MD;  Location: [facility];  Service: Urology;  Laterality: Left;  . EXTRACORPOREAL SHOCK WAVE LITHOTRIPSY  2012  . EXTRACORPOREAL SHOCK WAVE LITHOTRIPSY Left 08/20/2018   Procedure: EXTRACORPOREAL SHOCK WAVE LITHOTRIPSY (ESWL);  Surgeon: Hollice Espy, MD;  Location: [facility];  Service: Urology;  Laterality: Left; (cancelled)  .  EXTRACORPOREAL SHOCK WAVE LITHOTRIPSY Left 08/27/2018   Procedure: EXTRACORPOREAL SHOCK WAVE LITHOTRIPSY (ESWL);  Surgeon: Abbie Sons, MD;  Location: [facility];  Service: Urology;  Laterality: Left;  . EXTRACORPOREAL SHOCK WAVE LITHOTRIPSY Right 10/15/2018   Procedure: EXTRACORPOREAL SHOCK WAVE LITHOTRIPSY (ESWL);  Surgeon: Billey Co, MD;  Location: [facility];  Service: Urology;  Laterality: Right;  . KIDNEY STONE SURGERY    . LUMBAR FUSION  05/13/2012    Prior to Admission medications   Medication Sig Start Date End Date Taking? Authorizing Provider  acetaminophen (TYLENOL) 325 MG tablet Take 2 tablets (650 mg total) by mouth every 6 (six) hours as needed for mild pain (or Fever >/= 101). 10/17/20  Yes Wieting, Richard, MD  amLODipine (NORVASC) 5 MG tablet Take 1 tablet (5 mg total) by mouth daily. 10/18/20  Yes Loletha Grayer, MD  aspirin EC 81 MG EC tablet Take 1 tablet (81 mg total) by mouth daily. Swallow whole. 10/18/20  Yes Wieting, Richard, MD  atorvastatin (LIPITOR) 40 MG tablet Take 40 mg by mouth daily.   Yes [provider]  baclofen (LIORESAL) 10 MG tablet Take 10 mg by mouth.   Yes [provider]  buPROPion (WELLBUTRIN XL) 150 MG 24 hr tablet Take 150 mg by mouth daily.   Yes [provider]  Cholecalciferol 25 MCG (1000 UT) tablet Take 1,000 Units  by mouth daily.   Yes [provider]  citalopram (CELEXA) 20 MG tablet Take 20 mg by mouth daily. 09/20/20  Yes [provider]  clopidogrel (PLAVIX) 75 MG tablet Take 75 mg by mouth daily.   Yes [provider]  feeding supplement (ENSURE ENLIVE / ENSURE PLUS) LIQD Take 237 mLs by mouth 2 (two) times daily between meals. 10/17/20  Yes Wieting, Richard, MD  folic acid (FOLVITE) 1 MG tablet Take 1 tablet (1 mg total) by mouth daily. 10/18/20  Yes Wieting, Richard, MD  LORazepam (ATIVAN) 0.5 MG tablet Take 0.5 tablets (0.25 mg total) by mouth daily. 10/18/20  Yes Wieting,  Richard, MD  meclizine (ANTIVERT) 25 MG tablet Take 25 mg by mouth 3 (three) times daily as needed for dizziness.   Yes [provider]  melatonin 5 MG TABS Take 0.5 tablets (2.5 mg total) by mouth at bedtime. 10/17/20  Yes Wieting, Richard, MD  Multiple Vitamin (MULTIVITAMIN WITH MINERALS) TABS tablet Take 1 tablet by mouth daily. 10/18/20  Yes Wieting, Richard, MD  nicotine (NICODERM CQ - DOSED IN MG/24 HOURS) 21 mg/24hr patch Place 1 patch (21 mg total) onto the skin daily. 10/17/20  Yes Wieting, Richard, MD  QUEtiapine (SEROQUEL) 200 MG tablet Take 1 tablet (200 mg total) by mouth at bedtime. 10/17/20  Yes Wieting, Richard, MD  risperiDONE (RISPERDAL) 0.5 MG tablet Take 0.5 mg by mouth.   Yes [provider]  tamsulosin (FLOMAX) 0.4 MG CAPS capsule Take 1 capsule (0.4 mg total) by mouth daily. 10/17/20  Yes Wieting, Richard, MD  traZODone (DESYREL) 150 MG tablet Take 1 tablet by mouth at bedtime. 08/04/20  Yes [provider]  benzonatate (TESSALON) 200 MG capsule Take 200 mg by mouth 3 (three) times daily as needed for cough. Patient not taking: Reported on 01/01/2021    [provider]  menthol-cetylpyridinium (CEPACOL) 3 MG lozenge Take 1 lozenge (3 mg total) by mouth as needed for sore throat. Patient not taking: Reported on 01/01/2021 10/18/20   Antonieta Pert, MD  mometasone (ELOCON) 0.1 % cream Apply 1 application topically See admin instructions. INTO EARS DAILY Patient not taking: No sig reported    [provider]    Allergies as of 11/10/2020  . (No Known Allergies)    Family History  Problem Relation Age of Onset  . Stroke Mother   . Diabetes Mother   . Lung cancer Father   . Diabetes Father     Social History   Socioeconomic History  . Marital status: Divorced    Spouse name: Not on file  . Number of children: 2  . Years of education: Not on file  . Highest education level: Associate degree: occupational, Hotel manager, or  vocational program  Occupational History  . Not on file  Tobacco Use  . Smoking status: Current Every Day Smoker    Packs/day: 0.25    Years: 53.00    Pack years: 13.25    Types: Cigarettes  . Smokeless tobacco: Never Used  Vaping Use  . Vaping Use: Never used  Substance and Sexual Activity  . Alcohol use: Never    Alcohol/week: 0.0 standard drinks  . Drug use: Not Currently    Types: Heroin, Cocaine, Marijuana    Comment: 1960's  . Sexual activity: Yes  Other Topics Concern  . Not on file  Social History Narrative   ** Merged History Encounter **       Social Determinants of Health   Financial  Resource Strain: Not on file  Food Insecurity: Not on file  Transportation Needs: Not on file  Physical Activity: Not on file  Stress: Not on file  Social Connections: Not on file  Intimate Partner Violence: Not on file    Review of Systems: See HPI, otherwise negative ROS  Physical Exam: BP (!) 148/64   Pulse 76   Temp (!) 96 F (35.6 C) (Temporal)   Ht 5\' 2"  (1.575 m)   Wt 68 kg   SpO2 98%   BMI 27.44 kg/m  General:   Alert,  pleasant and cooperative in NAD Head:  Normocephalic and atraumatic. Respiratory:  Normal work of breathing.  Impression/Plan: [name] is here for cataract surgery.  Risks, benefits, limitations, and alternatives regarding cataract surgery have been reviewed with the patient.  Questions have been answered.  All parties agreeable.   Benay Pillow, MD  01/01/2021, 8:31 AM

## 2021-01-01 NOTE — Op Note (Signed)
OPERATIVE NOTE  [name] [id number] 01/01/2021   PREOPERATIVE DIAGNOSIS:  Nuclear sclerotic cataract left eye.  H25.12   POSTOPERATIVE DIAGNOSIS:    Nuclear sclerotic cataract left eye.     PROCEDURE:  Phacoemusification with posterior chamber intraocular lens placement of the left eye   LENS:   Implant Name Type Inv. Item Serial No. Manufacturer Lot No. LRB No. Used Action  LENS IOL TECNIS EYHANCE 15.5 - [id number] Intraocular Lens LENS IOL TECNIS EYHANCE 15.5 [id number] JOHNSON   Left 1 Implanted      Procedure(s) with comments: CATARACT EXTRACTION PHACO AND INTRAOCULAR LENS PLACEMENT (IOC) LEFT DIABETIC 2.28 00:24.7 (Left) - covid + 11-30-20  DIB00 +15.5   ULTRASOUND TIME: 0 minutes 24 seconds.  CDE 2.28   SURGEON:  Benay Pillow, MD, MPH   ANESTHESIA:  Topical with tetracaine drops augmented with 1% preservative-free intracameral lidocaine.  ESTIMATED BLOOD LOSS: <1 mL   COMPLICATIONS:  None.   DESCRIPTION OF PROCEDURE:  The patient was identified in the holding room and transported to the operating room and placed in the supine position under the operating microscope.  The left eye was identified as the operative eye and it was prepped and draped in the usual sterile ophthalmic fashion.   A 1.0 millimeter clear-corneal paracentesis was made at the 5:00 position. 0.5 ml of preservative-free 1% lidocaine with epinephrine was injected into the anterior chamber.  The anterior chamber was filled with Healon 5 viscoelastic.  A 2.4 millimeter keratome was used to make a near-clear corneal incision at the 2:00 position.  A curvilinear capsulorrhexis was made with a cystotome and capsulorrhexis forceps.  Balanced salt solution was used to hydrodissect and hydrodelineate the nucleus.   Phacoemulsification was then used in stop and chop fashion to remove the lens nucleus and epinucleus.  The remaining cortex was then removed using the irrigation and aspiration handpiece. Healon was  then placed into the capsular bag to distend it for lens placement.  A lens was then injected into the capsular bag.  The remaining viscoelastic was aspirated.   Wounds were hydrated with balanced salt solution.  The anterior chamber was inflated to a physiologic pressure with balanced salt solution.  Intracameral vigamox 0.1 mL undiltued was injected into the eye and a drop placed onto the ocular surface.  No wound leaks were noted.  The patient was taken to the recovery room in stable condition without complications of anesthesia or surgery  Benay Pillow 01/01/2021, 9:05 AM

## 2021-01-01 NOTE — Anesthesia Procedure Notes (Signed)
Procedure Name: MAC Date/Time: 01/01/2021 8:42 AM Performed by: Cameron Ali, CRNA Pre-anesthesia Checklist: Patient identified, Emergency Drugs available, Suction available, Timeout performed and Patient being monitored Patient Re-evaluated:Patient Re-evaluated prior to induction Oxygen Delivery Method: Nasal cannula Placement Confirmation: positive ETCO2

## 2021-01-01 NOTE — Transfer of Care (Signed)
Immediate Anesthesia Transfer of Care Note  Patient: [name]  Procedure(s) Performed: CATARACT EXTRACTION PHACO AND INTRAOCULAR LENS PLACEMENT (IOC) LEFT DIABETIC 2.28 00:24.7 (Left Eye)  Patient Location: PACU  Anesthesia Type: MAC  Level of Consciousness: awake, alert  and patient cooperative  Airway and Oxygen Therapy: Patient Spontanous Breathing and Patient connected to supplemental oxygen  Post-op Assessment: Post-op Vital signs reviewed, Patient's Cardiovascular Status Stable, Respiratory Function Stable, Patent Airway and No signs of Nausea or vomiting  Post-op Vital Signs: Reviewed and stable  Complications: No complications documented.

## 2021-01-01 NOTE — Anesthesia Preprocedure Evaluation (Signed)
Anesthesia Evaluation  Patient identified by MRN, date of birth, ID band Patient awake    Reviewed: Allergy & Precautions, NPO status   Airway Mallampati: II  TM Distance: >3 FB     Dental   Pulmonary Current Smoker and Patient abstained from smoking.,    breath sounds clear to auscultation       Cardiovascular hypertension, + Peripheral Vascular Disease   Rhythm:Regular Rate:Normal     Neuro/Psych Anxiety Depression Bipolar Disorder Hx substance abuseCVA (2019)    GI/Hepatic GERD  ,(+) Hepatitis - (C - treated with Harvoni)  Endo/Other    Renal/GU Hx kidney stones     Musculoskeletal  (+) Arthritis ,   Abdominal   Peds  Hematology   Anesthesia Other Findings   Reproductive/Obstetrics                             Anesthesia Physical Anesthesia Plan  ASA: III  Anesthesia Plan: MAC   Post-op Pain Management:    Induction: Intravenous  PONV Risk Score and Plan: TIVA, Midazolam and Treatment may vary due to age or medical condition  Airway Management Planned: Natural Airway and Nasal Cannula  Additional Equipment:   Intra-op Plan:   Post-operative Plan:   Informed Consent: I have reviewed the patients History and Physical, chart, labs and discussed the procedure including the risks, benefits and alternatives for the proposed anesthesia with the patient or authorized representative who has indicated his/her understanding and acceptance.       Plan Discussed with: CRNA  Anesthesia Plan Comments:         Anesthesia Quick Evaluation

## 2021-01-01 NOTE — Anesthesia Postprocedure Evaluation (Signed)
Anesthesia Post Note  Patient: [name]  Procedure(s) Performed: CATARACT EXTRACTION PHACO AND INTRAOCULAR LENS PLACEMENT (IOC) LEFT DIABETIC 2.28 00:24.7 (Left Eye)     Patient location during evaluation: PACU Anesthesia Type: MAC Level of consciousness: awake Pain management: pain level controlled Vital Signs Assessment: post-procedure vital signs reviewed and stable Respiratory status: respiratory function stable Cardiovascular status: stable Postop Assessment: no apparent nausea or vomiting Anesthetic complications: no   No complications documented.  Veda Canning

## 2021-01-03 ENCOUNTER — Encounter: Payer: Self-pay | Admitting: Ophthalmology

## 2021-01-10 DIAGNOSIS — H2511 Age-related nuclear cataract, right eye: Secondary | ICD-10-CM | POA: Diagnosis not present

## 2021-01-11 ENCOUNTER — Other Ambulatory Visit: Payer: Self-pay

## 2021-01-11 ENCOUNTER — Other Ambulatory Visit
Admission: RE | Admit: 2021-01-11 | Discharge: 2021-01-11 | Disposition: A | Discharge status: Home / Self Care | Payer: Medicare HMO | Source: Ambulatory Visit | Attending: Ophthalmology | Admitting: Ophthalmology

## 2021-01-11 DIAGNOSIS — Z20822 Contact with and (suspected) exposure to covid-19: Secondary | ICD-10-CM | POA: Insufficient documentation

## 2021-01-11 DIAGNOSIS — Z01812 Encounter for preprocedural laboratory examination: Secondary | ICD-10-CM | POA: Diagnosis not present

## 2021-01-11 LAB — SARS CORONAVIRUS 2 (TAT 6-24 HRS): SARS Coronavirus 2: NEGATIVE

## 2021-01-11 NOTE — Discharge Instructions (Signed)

## 2021-01-15 ENCOUNTER — Ambulatory Visit
Admission: RE | Admit: 2021-01-15 | Discharge: 2021-01-15 | Disposition: A | Discharge status: Home / Self Care | Payer: Medicare HMO | Attending: Ophthalmology | Admitting: Ophthalmology

## 2021-01-15 ENCOUNTER — Encounter
Admission: RE | Disposition: A | Discharge status: Home / Self Care | Payer: Self-pay | Source: Home / Self Care | Attending: Ophthalmology

## 2021-01-15 ENCOUNTER — Ambulatory Visit: Payer: Medicare HMO | Admitting: Anesthesiology

## 2021-01-15 ENCOUNTER — Other Ambulatory Visit: Payer: Self-pay

## 2021-01-15 ENCOUNTER — Encounter: Payer: Self-pay | Admitting: Ophthalmology

## 2021-01-15 DIAGNOSIS — Z7902 Long term (current) use of antithrombotics/antiplatelets: Secondary | ICD-10-CM | POA: Insufficient documentation

## 2021-01-15 DIAGNOSIS — Z720 Tobacco use: Secondary | ICD-10-CM | POA: Diagnosis not present

## 2021-01-15 DIAGNOSIS — E1136 Type 2 diabetes mellitus with diabetic cataract: Secondary | ICD-10-CM | POA: Diagnosis not present

## 2021-01-15 DIAGNOSIS — R55 Syncope and collapse: Secondary | ICD-10-CM | POA: Diagnosis not present

## 2021-01-15 DIAGNOSIS — H2511 Age-related nuclear cataract, right eye: Secondary | ICD-10-CM | POA: Diagnosis not present

## 2021-01-15 DIAGNOSIS — Z823 Family history of stroke: Secondary | ICD-10-CM | POA: Insufficient documentation

## 2021-01-15 DIAGNOSIS — R7309 Other abnormal glucose: Secondary | ICD-10-CM | POA: Diagnosis not present

## 2021-01-15 DIAGNOSIS — I10 Essential (primary) hypertension: Secondary | ICD-10-CM | POA: Diagnosis not present

## 2021-01-15 DIAGNOSIS — Z8673 Personal history of transient ischemic attack (TIA), and cerebral infarction without residual deficits: Secondary | ICD-10-CM | POA: Diagnosis not present

## 2021-01-15 DIAGNOSIS — F17210 Nicotine dependence, cigarettes, uncomplicated: Secondary | ICD-10-CM | POA: Insufficient documentation

## 2021-01-15 DIAGNOSIS — Z79899 Other long term (current) drug therapy: Secondary | ICD-10-CM | POA: Diagnosis not present

## 2021-01-15 DIAGNOSIS — R600 Localized edema: Secondary | ICD-10-CM | POA: Diagnosis not present

## 2021-01-15 DIAGNOSIS — Z9049 Acquired absence of other specified parts of digestive tract: Secondary | ICD-10-CM | POA: Insufficient documentation

## 2021-01-15 DIAGNOSIS — Z8619 Personal history of other infectious and parasitic diseases: Secondary | ICD-10-CM | POA: Insufficient documentation

## 2021-01-15 DIAGNOSIS — Z7982 Long term (current) use of aspirin: Secondary | ICD-10-CM | POA: Insufficient documentation

## 2021-01-15 DIAGNOSIS — G510 Bell's palsy: Secondary | ICD-10-CM | POA: Insufficient documentation

## 2021-01-15 DIAGNOSIS — Z125 Encounter for screening for malignant neoplasm of prostate: Secondary | ICD-10-CM | POA: Diagnosis not present

## 2021-01-15 DIAGNOSIS — Z0000 Encounter for general adult medical examination without abnormal findings: Secondary | ICD-10-CM | POA: Diagnosis not present

## 2021-01-15 DIAGNOSIS — H25811 Combined forms of age-related cataract, right eye: Secondary | ICD-10-CM | POA: Diagnosis not present

## 2021-01-15 DIAGNOSIS — N200 Calculus of kidney: Secondary | ICD-10-CM | POA: Diagnosis not present

## 2021-01-15 HISTORY — PX: CATARACT EXTRACTION W/PHACO: SHX586

## 2021-01-15 SURGERY — PHACOEMULSIFICATION, CATARACT, WITH IOL INSERTION
Anesthesia: Monitor Anesthesia Care | Site: Eye | Laterality: Right

## 2021-01-15 MED ORDER — ONDANSETRON HCL 4 MG/2ML IJ SOLN: 4.0000 mg | Freq: Once | INTRAMUSCULAR | Status: DC | PRN

## 2021-01-15 MED ORDER — ACETAMINOPHEN 325 MG PO TABS: 325.0000 mg | ORAL_TABLET | ORAL | Status: DC | PRN

## 2021-01-15 MED ORDER — MIDAZOLAM HCL 2 MG/2ML IJ SOLN
INTRAMUSCULAR | Status: DC | PRN
  Administered 2021-01-15: 1 mg via INTRAVENOUS

## 2021-01-15 MED ORDER — LACTATED RINGERS IV SOLN: INTRAVENOUS | Status: DC

## 2021-01-15 MED ORDER — SODIUM HYALURONATE 10 MG/ML IO SOLN
INTRAOCULAR | Status: DC | PRN
  Administered 2021-01-15: 0.55 mL via INTRAOCULAR

## 2021-01-15 MED ORDER — SODIUM HYALURONATE 23 MG/ML IO SOLN
INTRAOCULAR | Status: DC | PRN
  Administered 2021-01-15: 0.6 mL via INTRAOCULAR

## 2021-01-15 MED ORDER — MOXIFLOXACIN HCL 0.5 % OP SOLN
OPHTHALMIC | Status: DC | PRN
  Administered 2021-01-15: 0.2 mL via OPHTHALMIC

## 2021-01-15 MED ORDER — TETRACAINE HCL 0.5 % OP SOLN
1.0000 [drp] | OPHTHALMIC | Status: DC | PRN
  Administered 2021-01-15 (×3): 1 [drp] via OPHTHALMIC

## 2021-01-15 MED ORDER — LIDOCAINE HCL (PF) 2 % IJ SOLN
INTRAOCULAR | Status: DC | PRN
  Administered 2021-01-15: 1 mL via INTRAOCULAR

## 2021-01-15 MED ORDER — ACETAMINOPHEN 160 MG/5ML PO SOLN: 325.0000 mg | ORAL | Status: DC | PRN

## 2021-01-15 MED ORDER — EPINEPHRINE PF 1 MG/ML IJ SOLN
INTRAOCULAR | Status: DC | PRN
  Administered 2021-01-15: 80 mL via OPHTHALMIC

## 2021-01-15 MED ORDER — FENTANYL CITRATE (PF) 100 MCG/2ML IJ SOLN
INTRAMUSCULAR | Status: DC | PRN
  Administered 2021-01-15: 50 ug via INTRAVENOUS

## 2021-01-15 MED ORDER — ARMC OPHTHALMIC DILATING DROPS
1.0000 "application " | OPHTHALMIC | Status: DC | PRN
  Administered 2021-01-15 (×3): 1 via OPHTHALMIC

## 2021-01-15 SURGICAL SUPPLY — 17 items
CANNULA ANT/CHMB 27G (MISCELLANEOUS) ×2 IMPLANT
CANNULA ANT/CHMB 27GA (MISCELLANEOUS) ×4 IMPLANT
DISSECTOR HYDRO NUCLEUS 50X22 (MISCELLANEOUS) ×2 IMPLANT
GLOVE SURG SYN 8.5  E (GLOVE) ×3
GLOVE SURG SYN 8.5 E (GLOVE) ×3 IMPLANT
GLOVE SURG SYN 8.5 PF PI (GLOVE) ×1 IMPLANT
GOWN STRL REUS W/ TWL LRG LVL3 (GOWN DISPOSABLE) ×2 IMPLANT
GOWN STRL REUS W/TWL LRG LVL3 (GOWN DISPOSABLE) ×4
LENS IOL TECNIS EYHANCE 16.5 (Intraocular Lens) ×1 IMPLANT
MARKER SKIN DUAL TIP RULER LAB (MISCELLANEOUS) ×2 IMPLANT
PACK DR. KING ARMS (PACKS) ×2 IMPLANT
PACK EYE AFTER SURG (MISCELLANEOUS) ×2 IMPLANT
PACK OPTHALMIC (MISCELLANEOUS) ×2 IMPLANT
SYR 3ML LL SCALE MARK (SYRINGE) ×2 IMPLANT
SYR TB 1ML LUER SLIP (SYRINGE) ×2 IMPLANT
WATER STERILE IRR 250ML POUR (IV SOLUTION) ×2 IMPLANT
WIPE NON LINTING 3.25X3.25 (MISCELLANEOUS) ×2 IMPLANT

## 2021-01-15 NOTE — Anesthesia Procedure Notes (Signed)
Procedure Name: MAC Date/Time: 01/15/2021 7:38 AM Performed by: Cameron Ali, CRNA Pre-anesthesia Checklist: Patient identified, Emergency Drugs available, Suction available, Timeout performed and Patient being monitored Patient Re-evaluated:Patient Re-evaluated prior to induction Oxygen Delivery Method: Nasal cannula Placement Confirmation: positive ETCO2

## 2021-01-15 NOTE — Anesthesia Postprocedure Evaluation (Signed)
Anesthesia Post Note  Patient: [name]  Procedure(s) Performed: CATARACT EXTRACTION PHACO AND INTRAOCULAR LENS PLACEMENT (IOC) RIGHT DIABETIC (Right Eye)     Patient location during evaluation: PACU Anesthesia Type: MAC Level of consciousness: awake and alert Pain management: pain level controlled Vital Signs Assessment: post-procedure vital signs reviewed and stable Respiratory status: spontaneous breathing, nonlabored ventilation, respiratory function stable and patient connected to nasal cannula oxygen Cardiovascular status: stable and blood pressure returned to baseline Postop Assessment: no apparent nausea or vomiting Anesthetic complications: no   No complications documented.  Sinda Du

## 2021-01-15 NOTE — H&P (Signed)
[place]   Primary Care Physician:  Tracie Harrier, MD Ophthalmologist: Dr. Benay Pillow  Pre-Procedure History & Physical: HPI:  [name] is a 75 y.o. male here for cataract surgery.   Past Medical History:  Diagnosis Date  . Anxiety   . Arthritis    DDD, lumbar radiculopathy  . Bell's palsy    hx of, right side  . Bell's palsy   . Chronic back pain    followed by pain clinic at [facility]  . Depression    hx of depression  . GERD (gastroesophageal reflux disease)   . Hepatitis    hx of hepatitis C treated with Harvoni per patient  . History of kidney stones   . Hypertension   . Kidney stone    hx of  . Pre-diabetes   . Stroke ([place]) 02/2018   loss of peripheral vision and balance unsteady  . Substance abuse ([place])    heroin  . Wears dentures    Has full upper.  Does not wear.    Past Surgical History:  Procedure Laterality Date  . ANTERIOR LUMBAR FUSION  05/13/2012   Procedure: ANTERIOR LUMBAR FUSION 1 LEVEL;  Surgeon: Melina Schools, MD;  Location: [place];  Service: Orthopedics;  Laterality: N/A;  ALIF L5-S1  . CATARACT EXTRACTION W/PHACO Left 01/01/2021   Procedure: CATARACT EXTRACTION PHACO AND INTRAOCULAR LENS PLACEMENT (IOC) LEFT DIABETIC 2.28 00:24.7;  Surgeon: Eulogio Bear, MD;  Location: [place];  Service: Ophthalmology;  Laterality: Left;  covid + 11-30-20  . CHOLECYSTECTOMY    . CYSTOSCOPY W/ RETROGRADES Left 08/04/2018   Procedure: CYSTOSCOPY WITH RETROGRADE PYELOGRAM;  Surgeon: Abbie Sons, MD;  Location: [facility];  Service: Urology;  Laterality: Left;  . CYSTOSCOPY WITH INSERTION OF UROLIFT N/A 01/18/2020   Procedure: CYSTOSCOPY WITH INSERTION OF UROLIFT;  Surgeon: Abbie Sons, MD;  Location: [facility];  Service: Urology;  Laterality: N/A;  . CYSTOSCOPY WITH STENT PLACEMENT Left 08/04/2018   Procedure: CYSTOSCOPY WITH STENT PLACEMENT;  Surgeon: Abbie Sons, MD;  Location: [facility];  Service: Urology;  Laterality:  Left;  . EXTRACORPOREAL SHOCK WAVE LITHOTRIPSY  2012  . EXTRACORPOREAL SHOCK WAVE LITHOTRIPSY Left 08/20/2018   Procedure: EXTRACORPOREAL SHOCK WAVE LITHOTRIPSY (ESWL);  Surgeon: Hollice Espy, MD;  Location: [facility];  Service: Urology;  Laterality: Left; (cancelled)  . EXTRACORPOREAL SHOCK WAVE LITHOTRIPSY Left 08/27/2018   Procedure: EXTRACORPOREAL SHOCK WAVE LITHOTRIPSY (ESWL);  Surgeon: Abbie Sons, MD;  Location: [facility];  Service: Urology;  Laterality: Left;  . EXTRACORPOREAL SHOCK WAVE LITHOTRIPSY Right 10/15/2018   Procedure: EXTRACORPOREAL SHOCK WAVE LITHOTRIPSY (ESWL);  Surgeon: Billey Co, MD;  Location: [facility];  Service: Urology;  Laterality: Right;  . KIDNEY STONE SURGERY    . LUMBAR FUSION  05/13/2012    Prior to Admission medications   Medication Sig Start Date End Date Taking? Authorizing Provider  amLODipine (NORVASC) 5 MG tablet Take 1 tablet (5 mg total) by mouth daily. 10/18/20  Yes Loletha Grayer, MD  aspirin EC 81 MG EC tablet Take 1 tablet (81 mg total) by mouth daily. Swallow whole. 10/18/20  Yes Wieting, Richard, MD  atorvastatin (LIPITOR) 40 MG tablet Take 40 mg by mouth daily.   Yes [provider]  baclofen (LIORESAL) 10 MG tablet Take 10 mg by mouth.   Yes [provider]  buPROPion (WELLBUTRIN XL) 150 MG 24 hr tablet Take 150 mg by mouth daily.   Yes [provider]  Cholecalciferol 25 MCG (1000 UT) tablet Take 1,000 Units by mouth daily.   Yes [provider]  citalopram (CELEXA) 20 MG tablet Take 20 mg by mouth daily. 09/20/20  Yes [provider]  clopidogrel (PLAVIX) 75 MG tablet Take 75 mg by mouth daily.   Yes [provider]  LORazepam (ATIVAN) 0.5 MG tablet Take 0.5 tablets (0.25 mg total) by mouth daily. 10/18/20  Yes Wieting, Richard, MD  meclizine (ANTIVERT) 25 MG tablet Take 25 mg by mouth 3 (three) times daily as needed for dizziness.   Yes [provider]  melatonin 5 MG  TABS Take 0.5 tablets (2.5 mg total) by mouth at bedtime. 10/17/20  Yes Wieting, Richard, MD  Multiple Vitamin (MULTIVITAMIN WITH MINERALS) TABS tablet Take 1 tablet by mouth daily. 10/18/20  Yes Wieting, Richard, MD  nicotine (NICODERM CQ - DOSED IN MG/24 HOURS) 21 mg/24hr patch Place 1 patch (21 mg total) onto the skin daily. 10/17/20  Yes Wieting, Richard, MD  QUEtiapine (SEROQUEL) 200 MG tablet Take 1 tablet (200 mg total) by mouth at bedtime. 10/17/20  Yes Wieting, Richard, MD  risperiDONE (RISPERDAL) 0.5 MG tablet Take 0.5 mg by mouth.   Yes [provider]  tamsulosin (FLOMAX) 0.4 MG CAPS capsule Take 1 capsule (0.4 mg total) by mouth daily. 10/17/20  Yes Wieting, Richard, MD  traZODone (DESYREL) 150 MG tablet Take 1 tablet by mouth at bedtime. 08/04/20  Yes [provider]  acetaminophen (TYLENOL) 325 MG tablet Take 2 tablets (650 mg total) by mouth every 6 (six) hours as needed for mild pain (or Fever >/= 101). 10/17/20   Loletha Grayer, MD  benzonatate (TESSALON) 200 MG capsule Take 200 mg by mouth 3 (three) times daily as needed for cough. Patient not taking: Reported on 01/01/2021    [provider]  feeding supplement (ENSURE ENLIVE / ENSURE PLUS) LIQD Take 237 mLs by mouth 2 (two) times daily between meals. 10/17/20   Loletha Grayer, MD  folic acid (FOLVITE) 1 MG tablet Take 1 tablet (1 mg total) by mouth daily. 10/18/20   Loletha Grayer, MD  menthol-cetylpyridinium (CEPACOL) 3 MG lozenge Take 1 lozenge (3 mg total) by mouth as needed for sore throat. Patient not taking: Reported on 01/01/2021 10/18/20   Antonieta Pert, MD  mometasone (ELOCON) 0.1 % cream Apply 1 application topically See admin instructions. INTO EARS DAILY Patient not taking: No sig reported    [provider]    Allergies as of 11/10/2020  . (No Known Allergies)    Family History  Problem Relation Age of Onset  . Stroke Mother   . Diabetes Mother   . Lung cancer Father    . Diabetes Father     Social History   Socioeconomic History  . Marital status: Divorced    Spouse name: Not on file  . Number of children: 2  . Years of education: Not on file  . Highest education level: Associate degree: occupational, Hotel manager, or vocational program  Occupational History  . Not on file  Tobacco Use  . Smoking status: Current Every Day Smoker    Packs/day: 0.25    Years: 53.00    Pack years: 13.25    Types: Cigarettes  . Smokeless tobacco: Never Used  Vaping Use  . Vaping Use: Never used  Substance and Sexual Activity  . Alcohol use: Never    Alcohol/week: 0.0 standard drinks  . Drug use: Not Currently    Types: Heroin, Cocaine, Marijuana  Comment: 1960's  . Sexual activity: Yes  Other Topics Concern  . Not on file  Social History Narrative   ** Merged History Encounter **       Social Determinants of Health   Financial Resource Strain: Not on file  Food Insecurity: Not on file  Transportation Needs: Not on file  Physical Activity: Not on file  Stress: Not on file  Social Connections: Not on file  Intimate Partner Violence: Not on file    Review of Systems: See HPI, otherwise negative ROS  Physical Exam: BP 138/72   Pulse 90   Temp (!) 97.4 F (36.3 C) (Temporal)   Resp 16   Ht 5\' 2"  (1.575 m)   Wt 68 kg   SpO2 95%   BMI 27.44 kg/m  General:   Alert,  pleasant and cooperative in NAD Head:  Normocephalic and atraumatic. Respiratory:  Normal work of breathing. CV: RRR  Impression/Plan: [name] is here for cataract surgery.  Risks, benefits, limitations, and alternatives regarding cataract surgery have been reviewed with the patient.  Questions have been answered.  All parties agreeable.   Benay Pillow, MD  01/15/2021, 7:27 AM

## 2021-01-15 NOTE — Anesthesia Preprocedure Evaluation (Signed)
Anesthesia Evaluation  Patient identified by MRN, date of birth, ID band Patient awake    Reviewed: Allergy & Precautions, NPO status   Airway Mallampati: II  TM Distance: >3 FB     Dental no notable dental hx.    Pulmonary Current Smoker and Patient abstained from smoking.,    Pulmonary exam normal breath sounds clear to auscultation       Cardiovascular hypertension, + Peripheral Vascular Disease  Normal cardiovascular exam Rhythm:Regular Rate:Normal     Neuro/Psych PSYCHIATRIC DISORDERS Anxiety Depression Bipolar Disorder Hx substance abuse Neuromuscular disease CVA (2019)    GI/Hepatic GERD  ,(+) Hepatitis - (C - treated with Harvoni)  Endo/Other    Renal/GU Renal diseaseHx kidney stones     Musculoskeletal  (+) Arthritis ,   Abdominal Normal abdominal exam  (+) - obese,   Peds  Hematology   Anesthesia Other Findings   Reproductive/Obstetrics                             Anesthesia Physical  Anesthesia Plan  ASA: III  Anesthesia Plan: MAC   Post-op Pain Management:    Induction: Intravenous  PONV Risk Score and Plan: TIVA, Midazolam and Treatment may vary due to age or medical condition  Airway Management Planned: Natural Airway and Nasal Cannula  Additional Equipment:   Intra-op Plan:   Post-operative Plan:   Informed Consent: I have reviewed the patients History and Physical, chart, labs and discussed the procedure including the risks, benefits and alternatives for the proposed anesthesia with the patient or authorized representative who has indicated his/her understanding and acceptance.       Plan Discussed with: CRNA  Anesthesia Plan Comments:         Anesthesia Quick Evaluation Patient Active Problem List   Diagnosis Date Noted  . Overdose of benzodiazepine 10/16/2020  . Bipolar depression ([place]) 10/16/2020  . Acute metabolic encephalopathy   . Acute  cystitis with hematuria   . Weakness   . AMS (altered mental status) 10/10/2020  . AKI (acute kidney injury) ([place])   . Benign prostatic hyperplasia without lower urinary tract symptoms 12/31/2019  . Ataxia 03/09/2019  . Loss of peripheral visual field, bilateral 03/09/2019  . Seizure-like activity ([place]) 03/09/2019  . Hyperlipidemia 11/25/2018  . Bilateral carotid artery stenosis 11/25/2018  . Failed back syndrome, lumbar 07/23/2018  . Nephrolithiasis 07/08/2018  . History of CVA (cerebrovascular accident) 05/22/2018  . Visual disturbance 05/22/2018  . CVA (cerebral vascular accident) ([place]) 03/12/2018  . PAD (peripheral artery disease) ([place]) 04/20/2017  . Chronic hepatitis C without hepatic coma ([place]) 03/12/2017  . Pain and swelling of lower extremity 01/14/2016  . Chronic hip pain (Right) 01/14/2016  . Osteoarthritis of hip (Right) 01/14/2016  . Generalized anxiety disorder 01/14/2016  . Anxiety and depression 01/14/2016  . Lumbar facet syndrome 01/14/2016  . Clinical depression 01/11/2016  . Essential hypertension 01/11/2016  . Insomnia, psychophysiological 01/11/2016  . Current tobacco use 01/11/2016  . Chronic low back pain (Location of Primary Source of Pain) (Central) 01/11/2016  . Chronic pain 01/11/2016  . Lumbar spondylosis 01/11/2016  . Long term current use of opiate analgesic 01/11/2016  . Long term prescription opiate use 01/11/2016  . Opiate use (15 MME/Day) 01/11/2016  . Encounter for therapeutic drug level monitoring 01/11/2016  . Encounter for pain management planning 01/11/2016  . Failed back surgical syndrome (S/P anterior fusion at L5-S1) 01/11/2016  . Lumbar facet arthropathy  01/11/2016  . Lumbosacral radiculitis 08/16/2014  . DDD (degenerative disc disease), lumbar 08/16/2014  . Discogenic low back pain 04/27/2012    CBC Latest Ref Rng & Units 10/12/2020 10/11/2020 10/10/2020  WBC 4.0 - 10.5 K/uL 7.3 9.9 15.2(H)  Hemoglobin 13.0 - 17.0 g/dL 14.1 13.0  15.0  Hematocrit 39.0 - 52.0 % 42.1 38.6(L) 44.6  Platelets 150 - 400 K/uL 184 177 206   BMP Latest Ref Rng & Units 10/14/2020 10/12/2020 10/11/2020  Glucose 70 - 99 mg/dL 137(H) 128(H) 115(H)  BUN 8 - 23 mg/dL _0 Creatinine 0.61 - 1.24 mg/dL 0.60(L) 0.75 0.96  BUN/Creat Ratio 10 - 24 - - -  Sodium 135 - 145 mmol/L 141 144 138  Potassium 3.5 - 5.1 mmol/L 3.4(L) 3.8 3.3(L)  Chloride 98 - 111 mmol/L 105 110 105  CO2 22 - 32 mmol/L _1 Calcium 8.9 - 10.3 mg/dL 9.2 9.3 8.4(L)    Risks and benefits of anesthesia discussed at length, patient or surrogate demonstrates understanding. Appropriately NPO. Plan to proceed with anesthesia.  Champ Mungo, MD 01/15/21

## 2021-01-15 NOTE — Transfer of Care (Signed)
Immediate Anesthesia Transfer of Care Note  Patient: [name]  Procedure(s) Performed: CATARACT EXTRACTION PHACO AND INTRAOCULAR LENS PLACEMENT (IOC) RIGHT DIABETIC (Right Eye)  Patient Location: PACU  Anesthesia Type: MAC  Level of Consciousness: awake, alert  and patient cooperative  Airway and Oxygen Therapy: Patient Spontanous Breathing and Patient connected to supplemental oxygen  Post-op Assessment: Post-op Vital signs reviewed, Patient's Cardiovascular Status Stable, Respiratory Function Stable, Patent Airway and No signs of Nausea or vomiting  Post-op Vital Signs: Reviewed and stable  Complications: No complications documented.

## 2021-01-15 NOTE — Op Note (Signed)
OPERATIVE NOTE  [name] [id number] 01/15/2021   PREOPERATIVE DIAGNOSIS:  Nuclear sclerotic cataract right eye.  H25.11   POSTOPERATIVE DIAGNOSIS:    Nuclear sclerotic cataract right eye.     PROCEDURE:  Phacoemusification with posterior chamber intraocular lens placement of the right eye   LENS:   Implant Name Type Inv. Item Serial No. Manufacturer Lot No. LRB No. Used Action  LENS IOL TECNIS EYHANCE 16.5 - [id number] Intraocular Lens LENS IOL TECNIS EYHANCE 16.5 [id number] JOHNSON   Right 1 Implanted       Procedure(s) with comments: CATARACT EXTRACTION PHACO AND INTRAOCULAR LENS PLACEMENT (IOC) RIGHT DIABETIC (Right) - 2.62 0:34.5  DIB00 +16.5   ULTRASOUND TIME: 0 minutes 34 seconds.  CDE 2.62   SURGEON:  Benay Pillow, MD, MPH  ANESTHESIOLOGIST: Anesthesiologist: Sinda Du, MD CRNA: Cameron Ali, CRNA   ANESTHESIA:  Topical with tetracaine drops augmented with 1% preservative-free intracameral lidocaine.  ESTIMATED BLOOD LOSS: less than 1 mL.   COMPLICATIONS:  None.   DESCRIPTION OF PROCEDURE:  The patient was identified in the holding room and transported to the operating room and placed in the supine position under the operating microscope.  The right eye was identified as the operative eye and it was prepped and draped in the usual sterile ophthalmic fashion.   A 1.0 millimeter clear-corneal paracentesis was made at the 10:30 position. 0.5 ml of preservative-free 1% lidocaine with epinephrine was injected into the anterior chamber.  The anterior chamber was filled with Healon 5 viscoelastic.  A 2.4 millimeter keratome was used to make a near-clear corneal incision at the 8:00 position.  A curvilinear capsulorrhexis was made with a cystotome and capsulorrhexis forceps.  Balanced salt solution was used to hydrodissect and hydrodelineate the nucleus.   Phacoemulsification was then used in stop and chop fashion to remove the lens nucleus and epinucleus.  The  remaining cortex was then removed using the irrigation and aspiration handpiece. Healon was then placed into the capsular bag to distend it for lens placement.  A lens was then injected into the capsular bag.  The remaining viscoelastic was aspirated.   Wounds were hydrated with balanced salt solution.  The anterior chamber was inflated to a physiologic pressure with balanced salt solution.   Intracameral vigamox 0.1 mL undiluted was injected into the eye and a drop placed onto the ocular surface.  No wound leaks were noted.  The patient was taken to the recovery room in stable condition without complications of anesthesia or surgery  Benay Pillow 01/15/2021, 8:00 AM

## 2021-01-16 ENCOUNTER — Encounter: Payer: Self-pay | Admitting: Ophthalmology

## 2021-01-22 DIAGNOSIS — F17210 Nicotine dependence, cigarettes, uncomplicated: Secondary | ICD-10-CM | POA: Diagnosis not present

## 2021-01-22 DIAGNOSIS — G8929 Other chronic pain: Secondary | ICD-10-CM | POA: Diagnosis not present

## 2021-01-22 DIAGNOSIS — Z0000 Encounter for general adult medical examination without abnormal findings: Secondary | ICD-10-CM | POA: Diagnosis not present

## 2021-01-22 DIAGNOSIS — B182 Chronic viral hepatitis C: Secondary | ICD-10-CM | POA: Diagnosis not present

## 2021-01-22 DIAGNOSIS — F319 Bipolar disorder, unspecified: Secondary | ICD-10-CM | POA: Diagnosis not present

## 2021-01-22 DIAGNOSIS — I10 Essential (primary) hypertension: Secondary | ICD-10-CM | POA: Diagnosis not present

## 2021-01-22 DIAGNOSIS — Z79899 Other long term (current) drug therapy: Secondary | ICD-10-CM | POA: Diagnosis not present

## 2021-01-22 DIAGNOSIS — M5441 Lumbago with sciatica, right side: Secondary | ICD-10-CM | POA: Diagnosis not present

## 2021-01-22 DIAGNOSIS — Z720 Tobacco use: Secondary | ICD-10-CM | POA: Diagnosis not present

## 2021-01-26 DIAGNOSIS — F332 Major depressive disorder, recurrent severe without psychotic features: Secondary | ICD-10-CM | POA: Diagnosis not present

## 2021-04-05 ENCOUNTER — Emergency Department: Payer: Medicare Other

## 2021-04-05 ENCOUNTER — Other Ambulatory Visit: Payer: Self-pay

## 2021-04-05 ENCOUNTER — Observation Stay
Admission: EM | Admit: 2021-04-05 | Discharge: 2021-04-06 | Disposition: A | Discharge status: Home / Self Care | Payer: Medicare Other | Attending: Internal Medicine | Admitting: Internal Medicine

## 2021-04-05 DIAGNOSIS — M5136 Other intervertebral disc degeneration, lumbar region: Secondary | ICD-10-CM | POA: Diagnosis present

## 2021-04-05 DIAGNOSIS — I10 Essential (primary) hypertension: Secondary | ICD-10-CM | POA: Diagnosis not present

## 2021-04-05 DIAGNOSIS — D72829 Elevated white blood cell count, unspecified: Secondary | ICD-10-CM | POA: Diagnosis not present

## 2021-04-05 DIAGNOSIS — Z20822 Contact with and (suspected) exposure to covid-19: Secondary | ICD-10-CM | POA: Insufficient documentation

## 2021-04-05 DIAGNOSIS — Z79899 Other long term (current) drug therapy: Secondary | ICD-10-CM | POA: Diagnosis not present

## 2021-04-05 DIAGNOSIS — R569 Unspecified convulsions: Secondary | ICD-10-CM

## 2021-04-05 DIAGNOSIS — E785 Hyperlipidemia, unspecified: Secondary | ICD-10-CM | POA: Diagnosis present

## 2021-04-05 DIAGNOSIS — Z7982 Long term (current) use of aspirin: Secondary | ICD-10-CM | POA: Diagnosis not present

## 2021-04-05 DIAGNOSIS — F319 Bipolar disorder, unspecified: Secondary | ICD-10-CM | POA: Diagnosis present

## 2021-04-05 DIAGNOSIS — F1910 Other psychoactive substance abuse, uncomplicated: Secondary | ICD-10-CM | POA: Diagnosis not present

## 2021-04-05 DIAGNOSIS — F17210 Nicotine dependence, cigarettes, uncomplicated: Secondary | ICD-10-CM | POA: Diagnosis not present

## 2021-04-05 DIAGNOSIS — K113 Abscess of salivary gland: Principal | ICD-10-CM | POA: Diagnosis present

## 2021-04-05 DIAGNOSIS — F0390 Unspecified dementia without behavioral disturbance: Secondary | ICD-10-CM | POA: Insufficient documentation

## 2021-04-05 DIAGNOSIS — F419 Anxiety disorder, unspecified: Secondary | ICD-10-CM | POA: Diagnosis present

## 2021-04-05 DIAGNOSIS — I739 Peripheral vascular disease, unspecified: Secondary | ICD-10-CM | POA: Diagnosis present

## 2021-04-05 DIAGNOSIS — M51369 Other intervertebral disc degeneration, lumbar region without mention of lumbar back pain or lower extremity pain: Secondary | ICD-10-CM | POA: Diagnosis present

## 2021-04-05 DIAGNOSIS — F32A Depression, unspecified: Secondary | ICD-10-CM | POA: Diagnosis present

## 2021-04-05 DIAGNOSIS — F411 Generalized anxiety disorder: Secondary | ICD-10-CM | POA: Diagnosis present

## 2021-04-05 DIAGNOSIS — M542 Cervicalgia: Secondary | ICD-10-CM | POA: Diagnosis present

## 2021-04-05 DIAGNOSIS — Z720 Tobacco use: Secondary | ICD-10-CM | POA: Diagnosis present

## 2021-04-05 LAB — BASIC METABOLIC PANEL
Anion gap: 12 (ref 5–15)
BUN: 18 mg/dL (ref 8–23)
CO2: 25 mmol/L (ref 22–32)
Calcium: 9.6 mg/dL (ref 8.9–10.3)
Chloride: 101 mmol/L (ref 98–111)
Creatinine, Ser: 0.88 mg/dL (ref 0.61–1.24)
GFR, Estimated: >60 mL/min (ref >60)
Glucose, Bld: 155 mg/dL — ABNORMAL HIGH (ref 70–99)
Potassium: 4.1 mmol/L (ref 3.5–5.1)
Sodium: 138 mmol/L (ref 135–145)

## 2021-04-05 LAB — CBC
HCT: 41.8 % (ref 39.0–52.0)
Hemoglobin: 14.1 g/dL (ref 13.0–17.0)
MCH: 30.3 pg (ref 26.0–34.0)
MCHC: 33.7 g/dL (ref 30.0–36.0)
MCV: 89.7 fL (ref 80.0–100.0)
Platelets: 232 10*3/uL (ref 150–400)
RBC: 4.66 MIL/uL (ref 4.22–5.81)
RDW: 14.9 % (ref 11.5–15.5)
WBC: 15.6 10*3/uL — ABNORMAL HIGH (ref 4.0–10.5)
nRBC: 0 % (ref 0.0–0.2)

## 2021-04-05 LAB — RESP PANEL BY RT-PCR (FLU A&B, COVID) ARPGX2
Influenza A by PCR: NEGATIVE
Influenza B by PCR: NEGATIVE
SARS Coronavirus 2 by RT PCR: NEGATIVE

## 2021-04-05 MED ORDER — FUROSEMIDE 20 MG PO TABS
20.0000 mg | ORAL_TABLET | Freq: Every day | ORAL | Status: DC
  Administered 2021-04-06: 20 mg via ORAL
  Filled 2021-04-05: qty 1

## 2021-04-05 MED ORDER — DIPHENHYDRAMINE HCL 50 MG/ML IJ SOLN
12.5000 mg | Freq: Once | INTRAMUSCULAR | Status: AC
  Administered 2021-04-05: 12.5 mg via INTRAVENOUS
  Filled 2021-04-05: qty 1

## 2021-04-05 MED ORDER — HYDROMORPHONE HCL 1 MG/ML IJ SOLN
0.5000 mg | Freq: Once | INTRAMUSCULAR | Status: AC
  Administered 2021-04-05: 0.5 mg via INTRAVENOUS
  Filled 2021-04-05: qty 1

## 2021-04-05 MED ORDER — ONDANSETRON HCL 4 MG/2ML IJ SOLN: 4.0000 mg | Freq: Four times a day (QID) | INTRAMUSCULAR | Status: DC | PRN

## 2021-04-05 MED ORDER — TRAZODONE HCL 50 MG PO TABS
150.0000 mg | ORAL_TABLET | Freq: Every day | ORAL | Status: DC
  Administered 2021-04-05: 150 mg via ORAL
  Filled 2021-04-05: qty 1

## 2021-04-05 MED ORDER — CITALOPRAM HYDROBROMIDE 20 MG PO TABS
20.0000 mg | ORAL_TABLET | Freq: Every day | ORAL | Status: DC
  Administered 2021-04-06: 20 mg via ORAL
  Filled 2021-04-05: qty 1

## 2021-04-05 MED ORDER — ENOXAPARIN SODIUM 40 MG/0.4ML IJ SOSY
40.0000 mg | PREFILLED_SYRINGE | INTRAMUSCULAR | Status: DC
  Administered 2021-04-05: 40 mg via SUBCUTANEOUS
  Filled 2021-04-05: qty 0.4

## 2021-04-05 MED ORDER — QUETIAPINE FUMARATE 25 MG PO TABS
200.0000 mg | ORAL_TABLET | Freq: Every day | ORAL | Status: DC
  Administered 2021-04-05: 200 mg via ORAL
  Filled 2021-04-05: qty 8

## 2021-04-05 MED ORDER — TAMSULOSIN HCL 0.4 MG PO CAPS
0.4000 mg | ORAL_CAPSULE | Freq: Every day | ORAL | Status: DC
Start: 2021-04-05 — End: 2021-04-05

## 2021-04-05 MED ORDER — ADULT MULTIVITAMIN W/MINERALS CH
1.0000 | ORAL_TABLET | Freq: Every day | ORAL | Status: DC
  Administered 2021-04-06: 1 via ORAL
  Filled 2021-04-05: qty 1

## 2021-04-05 MED ORDER — ONDANSETRON HCL 4 MG PO TABS: 4.0000 mg | ORAL_TABLET | Freq: Four times a day (QID) | ORAL | Status: DC | PRN

## 2021-04-05 MED ORDER — HYDRALAZINE HCL 25 MG PO TABS: 25.0000 mg | ORAL_TABLET | Freq: Three times a day (TID) | ORAL | Status: DC | PRN

## 2021-04-05 MED ORDER — LORAZEPAM 0.5 MG PO TABS
0.2500 mg | ORAL_TABLET | Freq: Every day | ORAL | Status: DC
  Administered 2021-04-06: 0.25 mg via ORAL
  Filled 2021-04-05: qty 1

## 2021-04-05 MED ORDER — IOHEXOL 300 MG/ML  SOLN
75.0000 mL | Freq: Once | INTRAMUSCULAR | Status: AC | PRN
  Administered 2021-04-05: 75 mL via INTRAVENOUS

## 2021-04-05 MED ORDER — ACETAMINOPHEN 650 MG RE SUPP: 650.0000 mg | Freq: Four times a day (QID) | RECTAL | Status: DC | PRN

## 2021-04-05 MED ORDER — NICOTINE 21 MG/24HR TD PT24
21.0000 mg | MEDICATED_PATCH | Freq: Every day | TRANSDERMAL | Status: DC
  Administered 2021-04-05 – 2021-04-06 (×2): 21 mg via TRANSDERMAL
  Filled 2021-04-05 (×2): qty 1

## 2021-04-05 MED ORDER — MORPHINE SULFATE (PF) 4 MG/ML IV SOLN
4.0000 mg | Freq: Once | INTRAVENOUS | Status: AC
  Administered 2021-04-05: 4 mg via INTRAVENOUS
  Filled 2021-04-05: qty 1

## 2021-04-05 MED ORDER — ATORVASTATIN CALCIUM 20 MG PO TABS: 40.0000 mg | ORAL_TABLET | Freq: Every day | ORAL | Status: DC

## 2021-04-05 MED ORDER — SODIUM CHLORIDE 0.9 % IV SOLN
3.0000 g | Freq: Once | INTRAVENOUS | Status: AC
  Administered 2021-04-05: 3 g via INTRAVENOUS
  Filled 2021-04-05: qty 8

## 2021-04-05 MED ORDER — MENTHOL 3 MG MT LOZG
1.0000 | LOZENGE | OROMUCOSAL | Status: DC | PRN
  Filled 2021-04-05: qty 9

## 2021-04-05 MED ORDER — HYDROMORPHONE HCL 1 MG/ML IJ SOLN
0.5000 mg | INTRAMUSCULAR | Status: AC | PRN
  Administered 2021-04-05 – 2021-04-06 (×3): 0.5 mg via INTRAVENOUS
  Filled 2021-04-05: qty 0.5
  Filled 2021-04-05: qty 1
  Filled 2021-04-05: qty 0.5

## 2021-04-05 MED ORDER — MECLIZINE HCL 25 MG PO TABS
25.0000 mg | ORAL_TABLET | Freq: Three times a day (TID) | ORAL | Status: DC | PRN
  Filled 2021-04-05: qty 1

## 2021-04-05 MED ORDER — RISPERIDONE 0.5 MG PO TABS
0.5000 mg | ORAL_TABLET | Freq: Every day | ORAL | Status: DC
  Filled 2021-04-05: qty 1

## 2021-04-05 MED ORDER — MORPHINE SULFATE (PF) 2 MG/ML IV SOLN: 2.0000 mg | INTRAVENOUS | Status: DC | PRN

## 2021-04-05 MED ORDER — ACETAMINOPHEN 325 MG PO TABS: 650.0000 mg | ORAL_TABLET | Freq: Four times a day (QID) | ORAL | Status: DC | PRN

## 2021-04-05 MED ORDER — SODIUM CHLORIDE 0.9 % IV SOLN
3.0000 g | Freq: Four times a day (QID) | INTRAVENOUS | Status: DC
  Administered 2021-04-05 – 2021-04-06 (×3): 3 g via INTRAVENOUS
  Filled 2021-04-05: qty 8
  Filled 2021-04-05 (×2): qty 3
  Filled 2021-04-05: qty 8
  Filled 2021-04-05: qty 3

## 2021-04-05 MED ORDER — LIDOCAINE-EPINEPHRINE 2 %-1:100000 IJ SOLN
20.0000 mL | Freq: Once | INTRAMUSCULAR | Status: AC
  Administered 2021-04-05: 20 mL

## 2021-04-05 MED ORDER — HYDROCHLOROTHIAZIDE 25 MG PO TABS
12.5000 mg | ORAL_TABLET | Freq: Every day | ORAL | Status: DC
  Administered 2021-04-06: 12.5 mg via ORAL
  Filled 2021-04-05: qty 1

## 2021-04-05 MED ORDER — MELATONIN 5 MG PO TABS
2.5000 mg | ORAL_TABLET | Freq: Every day | ORAL | Status: DC
  Administered 2021-04-05: 2.5 mg via ORAL
  Filled 2021-04-05: qty 1

## 2021-04-05 MED ORDER — DEXAMETHASONE SODIUM PHOSPHATE 10 MG/ML IJ SOLN
10.0000 mg | Freq: Once | INTRAMUSCULAR | Status: AC
  Administered 2021-04-05: 10 mg via INTRAVENOUS
  Filled 2021-04-05: qty 1

## 2021-04-05 MED ORDER — BUPROPION HCL ER (XL) 150 MG PO TB24
150.0000 mg | ORAL_TABLET | Freq: Every day | ORAL | Status: DC
  Filled 2021-04-05 (×2): qty 1

## 2021-04-05 MED ORDER — CLOPIDOGREL BISULFATE 75 MG PO TABS
75.0000 mg | ORAL_TABLET | Freq: Every day | ORAL | Status: DC
  Filled 2021-04-05 (×2): qty 1

## 2021-04-05 MED ORDER — AMLODIPINE BESYLATE 5 MG PO TABS
5.0000 mg | ORAL_TABLET | Freq: Every day | ORAL | Status: DC
  Administered 2021-04-06: 5 mg via ORAL
  Filled 2021-04-05: qty 1

## 2021-04-05 MED ORDER — ENSURE ENLIVE PO LIQD: 237.0000 mL | Freq: Two times a day (BID) | ORAL | Status: DC

## 2021-04-05 NOTE — ED Notes (Signed)
Dr. Pryor Ochoa at bedside doing I and D

## 2021-04-05 NOTE — ED Triage Notes (Signed)
Pt to ED for lump to left side of neck behind ear, reports has had it for over a year but now having pain.  Painful with palpation

## 2021-04-05 NOTE — ED Provider Notes (Signed)
[facility] Emergency Department Provider Note   ____________________________________________   Event Date/Time   First MD Initiated Contact with Patient 04/05/21 1201     (approximate)  I have reviewed the triage vital signs and the nursing notes.   HISTORY  Chief Complaint Abscess    HPI [name] is a 75 y.o. male with past medical history of hypertension, hyperlipidemia, bipolar disorder, and stroke who presents to the ED complaining of neck pain and swelling.  Patient reports that he has noticed a swollen area to the left side of his neck for greater than 20 years but that it has become larger in size, red, and painful over the past 4 days.  It is primarily located near the angle of his mandible but does not extend to the lower part of his mandible.  He states it is painful for him to open his jaw but he denies any pain or swelling inside of his mouth.  He has not had any difficulty swallowing and denies any nausea or vomiting.  He has not had any fevers.        Past Medical History:  Diagnosis Date   Anxiety    Arthritis    DDD, lumbar radiculopathy   Bell's palsy    hx of, right side   Bell's palsy    Chronic back pain    followed by pain clinic at [facility]   Depression    hx of depression   GERD (gastroesophageal reflux disease)    Hepatitis    hx of hepatitis C treated with Harvoni per patient   History of kidney stones    Hypertension    Kidney stone    hx of   Pre-diabetes    Stroke ([place]) 02/2018   loss of peripheral vision and balance unsteady   Substance abuse ([place])    heroin   Wears dentures    Has full upper.  Does not wear.    Patient Active Problem List   Diagnosis Date Noted   Overdose of benzodiazepine 10/16/2020   Bipolar depression ([place]) 37/16/9678   Acute metabolic encephalopathy    Acute cystitis with hematuria    Weakness    AMS (altered mental status) 10/10/2020   AKI (acute kidney injury) ([place])     Benign prostatic hyperplasia without lower urinary tract symptoms 12/31/2019   Ataxia 03/09/2019   Loss of peripheral visual field, bilateral 03/09/2019   Seizure-like activity ([place]) 03/09/2019   Hyperlipidemia 11/25/2018   Bilateral carotid artery stenosis 11/25/2018   Failed back syndrome, lumbar 07/23/2018   Nephrolithiasis 07/08/2018   History of CVA (cerebrovascular accident) 05/22/2018   Visual disturbance 05/22/2018   CVA (cerebral vascular accident) ([place]) 03/12/2018   PAD (peripheral artery disease) ([place]) 04/20/2017   Chronic hepatitis C without hepatic coma ([place]) 03/12/2017   Pain and swelling of lower extremity 01/14/2016   Chronic hip pain (Right) 01/14/2016   Osteoarthritis of hip (Right) 01/14/2016   Generalized anxiety disorder 01/14/2016   Anxiety and depression 01/14/2016   Lumbar facet syndrome 01/14/2016   Clinical depression 01/11/2016   Essential hypertension 01/11/2016   Insomnia, psychophysiological 01/11/2016   Current tobacco use 01/11/2016   Chronic low back pain (Location of Primary Source of Pain) ([place]) 01/11/2016   Chronic pain 01/11/2016   Lumbar spondylosis 01/11/2016   Long term current use of opiate analgesic 01/11/2016   Long term prescription opiate use 01/11/2016   Opiate use (15 MME/Day) 01/11/2016   Encounter for therapeutic  drug level monitoring 01/11/2016   Encounter for pain management planning 01/11/2016   Failed back surgical syndrome (S/P anterior fusion at L5-S1) 01/11/2016   Lumbar facet arthropathy 01/11/2016   Lumbosacral radiculitis 08/16/2014   DDD (degenerative disc disease), lumbar 08/16/2014   Discogenic low back pain 04/27/2012    Past Surgical History:  Procedure Laterality Date   ANTERIOR LUMBAR FUSION  05/13/2012   Procedure: ANTERIOR LUMBAR FUSION 1 LEVEL;  Surgeon: Melina Schools, MD;  Location: [place];  Service: Orthopedics;  Laterality: N/A;  ALIF L5-S1   CATARACT EXTRACTION W/PHACO Left 01/01/2021   Procedure:  CATARACT EXTRACTION PHACO AND INTRAOCULAR LENS PLACEMENT (IOC) LEFT DIABETIC 2.28 00:24.7;  Surgeon: Eulogio Bear, MD;  Location: [place];  Service: Ophthalmology;  Laterality: Left;  covid + 11-30-20   CATARACT EXTRACTION W/PHACO Right 01/15/2021   Procedure: CATARACT EXTRACTION PHACO AND INTRAOCULAR LENS PLACEMENT ([place]) RIGHT DIABETIC;  Surgeon: Eulogio Bear, MD;  Location: [place];  Service: Ophthalmology;  Laterality: Right;  2.62 0:34.5   CHOLECYSTECTOMY     CYSTOSCOPY W/ RETROGRADES Left 08/04/2018   Procedure: CYSTOSCOPY WITH RETROGRADE PYELOGRAM;  Surgeon: Abbie Sons, MD;  Location: [facility];  Service: Urology;  Laterality: Left;   CYSTOSCOPY WITH INSERTION OF UROLIFT N/A 01/18/2020   Procedure: CYSTOSCOPY WITH INSERTION OF UROLIFT;  Surgeon: Abbie Sons, MD;  Location: [facility];  Service: Urology;  Laterality: N/A;   CYSTOSCOPY WITH STENT PLACEMENT Left 08/04/2018   Procedure: CYSTOSCOPY WITH STENT PLACEMENT;  Surgeon: Abbie Sons, MD;  Location: [facility];  Service: Urology;  Laterality: Left;   EXTRACORPOREAL SHOCK WAVE LITHOTRIPSY  2012   EXTRACORPOREAL SHOCK WAVE LITHOTRIPSY Left 08/20/2018   Procedure: EXTRACORPOREAL SHOCK WAVE LITHOTRIPSY (ESWL);  Surgeon: Hollice Espy, MD;  Location: [facility];  Service: Urology;  Laterality: Left; (cancelled)   EXTRACORPOREAL SHOCK WAVE LITHOTRIPSY Left 08/27/2018   Procedure: EXTRACORPOREAL SHOCK WAVE LITHOTRIPSY (ESWL);  Surgeon: Abbie Sons, MD;  Location: [facility];  Service: Urology;  Laterality: Left;   EXTRACORPOREAL SHOCK WAVE LITHOTRIPSY Right 10/15/2018   Procedure: EXTRACORPOREAL SHOCK WAVE LITHOTRIPSY (ESWL);  Surgeon: Billey Co, MD;  Location: [facility];  Service: Urology;  Laterality: Right;   KIDNEY STONE SURGERY     LUMBAR FUSION  05/13/2012    Prior to Admission medications   Medication Sig Start Date End Date Taking? Authorizing Provider  acetaminophen (TYLENOL) 325  MG tablet Take 2 tablets (650 mg total) by mouth every 6 (six) hours as needed for mild pain (or Fever >/= 101). 10/17/20   Loletha Grayer, MD  amLODipine (NORVASC) 5 MG tablet Take 1 tablet (5 mg total) by mouth daily. 10/18/20   Loletha Grayer, MD  aspirin EC 81 MG EC tablet Take 1 tablet (81 mg total) by mouth daily. Swallow whole. 10/18/20   Loletha Grayer, MD  atorvastatin (LIPITOR) 40 MG tablet Take 40 mg by mouth daily.    [provider]  baclofen (LIORESAL) 10 MG tablet Take 10 mg by mouth.    [provider]  benzonatate (TESSALON) 200 MG capsule Take 200 mg by mouth 3 (three) times daily as needed for cough. Patient not taking: Reported on 01/01/2021    [provider]  buPROPion (WELLBUTRIN XL) 150 MG 24 hr tablet Take 150 mg by mouth daily.    [provider]  Cholecalciferol 25 MCG (1000 UT) tablet Take 1,000 Units by mouth daily.    [provider]  citalopram (CELEXA) 20  MG tablet Take 20 mg by mouth daily. 09/20/20   [provider]  clopidogrel (PLAVIX) 75 MG tablet Take 75 mg by mouth daily.    [provider]  COVID-19 mRNA vaccine, Pfizer, 30 MCG/0.3ML injection USE AS DIRECTED 08/18/20 08/18/21  Carlyle Basques, MD  feeding supplement (ENSURE ENLIVE / ENSURE PLUS) LIQD Take 237 mLs by mouth 2 (two) times daily between meals. 10/17/20   Loletha Grayer, MD  folic acid (FOLVITE) 1 MG tablet Take 1 tablet (1 mg total) by mouth daily. 10/18/20   Loletha Grayer, MD  LORazepam (ATIVAN) 0.5 MG tablet Take 0.5 tablets (0.25 mg total) by mouth daily. 10/18/20   Loletha Grayer, MD  meclizine (ANTIVERT) 25 MG tablet Take 25 mg by mouth 3 (three) times daily as needed for dizziness.    [provider]  melatonin 5 MG TABS Take 0.5 tablets (2.5 mg total) by mouth at bedtime. 10/17/20   Loletha Grayer, MD  menthol-cetylpyridinium (CEPACOL) 3 MG lozenge Take 1 lozenge (3 mg total) by mouth as needed for sore  throat. Patient not taking: Reported on 01/01/2021 10/18/20   Antonieta Pert, MD  mometasone (ELOCON) 0.1 % cream Apply 1 application topically See admin instructions. INTO EARS DAILY Patient not taking: No sig reported    [provider]  Multiple Vitamin (MULTIVITAMIN WITH MINERALS) TABS tablet Take 1 tablet by mouth daily. 10/18/20   Loletha Grayer, MD  nicotine (NICODERM CQ - DOSED IN MG/24 HOURS) 21 mg/24hr patch Place 1 patch (21 mg total) onto the skin daily. 10/17/20   Loletha Grayer, MD  QUEtiapine (SEROQUEL) 200 MG tablet Take 1 tablet (200 mg total) by mouth at bedtime. 10/17/20   Loletha Grayer, MD  risperiDONE (RISPERDAL) 0.5 MG tablet Take 0.5 mg by mouth.    [provider]  tamsulosin (FLOMAX) 0.4 MG CAPS capsule Take 1 capsule (0.4 mg total) by mouth daily. 10/17/20   Loletha Grayer, MD  traZODone (DESYREL) 150 MG tablet Take 1 tablet by mouth at bedtime. 08/04/20   [provider]    Allergies Patient has no known allergies.  Family History  Problem Relation Age of Onset   Stroke Mother    Diabetes Mother    Lung cancer Father    Diabetes Father     Social History Social History   Tobacco Use   Smoking status: Every Day    Packs/day: 0.25    Years: 53.00    Pack years: 13.25    Types: Cigarettes   Smokeless tobacco: Never  Vaping Use   Vaping Use: Never used  Substance Use Topics   Alcohol use: Never    Alcohol/week: 0.0 standard drinks   Drug use: Not Currently    Types: Heroin, Cocaine, Marijuana    Comment: 1960's    Review of Systems  Constitutional: No fever/chills Eyes: No visual changes. ENT: No sore throat.  Positive for neck pain and swelling. Cardiovascular: Denies chest pain. Respiratory: Denies shortness of breath. Gastrointestinal: No abdominal pain.  No nausea, no vomiting.  No diarrhea.  No constipation. Genitourinary: Negative for dysuria. Musculoskeletal: Negative for back pain. Skin: Negative for  rash. Neurological: Negative for headaches, focal weakness or numbness.  ____________________________________________   PHYSICAL EXAM:  VITAL SIGNS: ED Triage Vitals  Enc Vitals Group     BP 04/05/21 1139 (!) 144/75     Pulse Rate 04/05/21 1139 (!) 103     Resp 04/05/21 1139 18     Temp 04/05/21 1139 99.1 F (  37.3 C)     Temp Source 04/05/21 1139 Oral     SpO2 04/05/21 1139 94 %     Weight 04/05/21 1150 149 lb (67.6 kg)     Height 04/05/21 1150 5\' 2"  (1.575 m)     Head Circumference --      Peak Flow --      Pain Score 04/05/21 1150 10     Pain Loc --      Pain Edu? --      Excl. in [place]? --     Constitutional: Alert and oriented. Eyes: Conjunctivae are normal. Head: Atraumatic. Nose: No congestion/rhinnorhea. Mouth/Throat: Mucous membranes are moist.  Poor dentition, no intraoral or gum edema or tenderness.  Submandibular compartments are soft. Neck: Normal ROM.  Edema, erythema, warmth, and induration to left lateral neck at the angle of the mandible with associated tenderness to palpation. Cardiovascular: Normal rate, regular rhythm. Grossly normal heart sounds. Respiratory: Normal respiratory effort.  No retractions. Lungs CTAB. Gastrointestinal: Soft and nontender. No distention. Genitourinary: deferred Musculoskeletal: No lower extremity tenderness nor edema. Neurologic:  Normal speech and language. No gross focal neurologic deficits are appreciated. Skin:  Skin is warm, dry and intact. No rash noted. Psychiatric: Mood and affect are normal. Speech and behavior are normal.  ____________________________________________   LABS (all labs ordered are listed, but only abnormal results are displayed)  Labs Reviewed  CBC - Abnormal; Notable for the following components:      Result Value   WBC 15.6 (*)    All other components within normal limits  BASIC METABOLIC PANEL - Abnormal; Notable for the following components:   Glucose, Bld 155 (*)    All other components  within normal limits  RESP PANEL BY RT-PCR (FLU A&B, COVID) [id number]    PROCEDURES  Procedure(s) performed (including Critical Care):  Procedures   ____________________________________________   INITIAL IMPRESSION / ASSESSMENT AND PLAN / ED COURSE      75 year old male with past medical history of hypertension, hyperlipidemia, stroke, and bipolar disorder who presents to the ED with increasing pain, swelling, and redness to the left side of his neck around the angle of the mandible.  Appearance is concerning for abscess given erythema, warmth, induration, and tenderness.  We will further assess with CT scan, basic labs remarkable for leukocytosis but vital signs do not suggest sepsis.  We will treat pain with IV morphine and reassess following CT results.  Notified by radiology that CT scan is concerning for abscess of the parotid gland versus degeneration of previously noted Warthin tumor.  Findings discussed with Dr. Pryor Ochoa of ENT, who will evaluate patient at the bedside but recommends IV antibiotics, Decadron, and admission to hospitalist service.      ____________________________________________   FINAL CLINICAL IMPRESSION(S) / ED DIAGNOSES  Final diagnoses:  Abscess of parotid gland     ED Discharge Orders     None        Note:  This document was prepared using Dragon voice recognition software and may include unintentional dictation errors.    Blake Divine, MD 04/05/21 [id number]

## 2021-04-05 NOTE — H&P (Signed)
History and Physical   [name]:[id number] DOB: 10-09-1946 DOA: 04/05/2021  PCP: Tracie Harrier, MD  Outpatient Specialists: Elijah Birk, psychiatry Patient coming from: home  I have personally briefly reviewed patient's old medical records in [place].  Chief Concern: Left-sided neck pain and swelling  HPI: [name] is a 75 y.o. male with medical history significant for hyperlipidemia, hypertension, major depressive disorder, generalized anxiety disorder, PAD, vascular dementia without behavioral disturbance, psychotic features, presents the emergency department for chief concerns of left-sided neck swelling and pain.  He reports that the pain has been going on for about 1 week, however he states that the lump in his neck has been present for greater than 20 years.  He reports that the pain is a 9 out of 10 and persistent.  He reports that after pain medication it improved to an 7-8.  He denies trauma to the neck area, fever, dysphagia, shortness of breath, chest pain, abdominal pain, odynophagia, cough.  He endorses generalized itchiness, hives for 1 week.  Spouse at bedside reports that they recently changed from Hammer liquid detergent to Tide liquid detergent for laundry for the past 2 loads.  Social history: He lives at home with his spouse.  Patient endorses daily tobacco use and is requesting nicotine patch.  Denies EtOH and recreational drug use.  Vaccination: Patient is vaccinated for COVID-19 with Pfizer, 4 doses  ROS: Constitutional: no weight change, no fever ENT/Mouth: no sore throat, no rhinorrhea Eyes: no eye pain, no vision changes Cardiovascular: no chest pain, no dyspnea,  no edema, no palpitations Respiratory: no cough, no sputum, no wheezing Gastrointestinal: no nausea, no vomiting, no diarrhea, no constipation Genitourinary: no urinary incontinence, no dysuria, no hematuria Musculoskeletal: no arthralgias, no myalgias Skin: no skin  lesions, + pruritus, Neuro: + weakness, no loss of consciousness, no syncope Psych: no anxiety, no depression, no decrease appetite Heme/Lymph: no bruising, no bleeding  ED Course: Discussed with ED provider, patient requiring hospitalization for left-sided parotid abscess.  Vitals in the emergency department was remarkable for temperature of 99.1, heart rate of 103 and improved to 78, respiration of 18 and improved to 19, blood pressure initially 144/75 and increased to 151/81.  SPO2 on room air was 95%.  Labs in the emergency department was remarkable for serum creatinine of 0.88, EGFR greater than 60.  WBC 15.6, hemoglobin 14.1, platelets 232.  EDP discussed case with ENT provider, Dr. Pryor Ochoa who recommends admission to the hospital and to give Dilaudid IV, IV antibiotics.  EDP Gave patient 1 dose of Decadron 10 mg IV, Unasyn 3 g IV, Dilaudid 0.5 mg IV, morphine 4 mg IV.  Assessment/Plan  Principal Problem:   Parotid abscess Active Problems:   Clinical depression   Essential hypertension   Current tobacco use   Generalized anxiety disorder   Anxiety and depression   PAD (peripheral artery disease) (HCC)   DDD (degenerative disc disease), lumbar   Hyperlipidemia   Seizure-like activity (HCC)   Bipolar depression ([place])   # Parotid abscess # Leukocytosis suspect secondary to parotid abscess - Admit to MedSurg, observation, no telemetry indicated at this time - Status post Unasyn 3 g IV and Decadron 10 mg IV per EDP - Continue with Unasyn per pharmacy - ENT, Dr. Pryor Ochoa states he will see the patient for possible abscess aspiration -CBC in the a.m.  # Parotid mass-per EDP, ENT provider reports that this may be a benign tumor -Pending further evaluation from ENT specialist -  Outpatient follow-up with ENT recommended  # Hypertension-elevated, resumed amlodipine 5 mg daily -Hydralazine 25 mg p.o. every 8 hours as needed for SBP greater than 180  # Hyperlipidemia-atorvastatin  40 mg nightly ordered  # Nicotine dependence-nicotine patch ordered  # History of psychosis-resumed home risperidone 0.5 mg nightly, quetiapine 200 mg nightly  # Insomnia-trazodone 150 mg nightly, melatonin 2.5 mg nightly  # Major depressive disorder # Generalized anxiety disorder-citalopram 20 mg daily, Wellbutrin 150 mg daily, Ativan 0.25 mg p.o. daily resumed  # Vascular dementia without behavioral disturbance BPH-tamsulosin 0.5 mg daily resumed  # PAD-Plavix 75 milligrams daily resumed  # Generalized urticaria-1 week, Per spouse, they recently changed from Hammer liquid detergent to Tide detergent approximately 1 week -I advised family to stop Tide liquid detergent and go back to the previous detergent - 1 dose of diphenhydramine 12.5 mg IV once  A.m. labs: CBC, BMP ordered  As needed medications: Hydralazine 25 mg every 8 hours as needed for SBP greater than 180, morphine 2 mg IV every 4 hours as needed for moderate pain 1 day ordered, Dilaudid 0.5 mg IV every 2 hours as needed for severe pain, 3 doses ordered  COVID PCR/influenza A/influenza B-pending  Chart reviewed.   DVT prophylaxis: Enoxaparin every 24 hours subcutaneous Code Status: Full code Diet: Heart healthy Family Communication: Updated spouse at bedside Disposition Plan: Pending clinical course Consults called: ENT Admission status: MedSurg, observation, no telemetry  Past Medical History:  Diagnosis Date   Anxiety    Arthritis    DDD, lumbar radiculopathy   Bell's palsy    hx of, right side   Bell's palsy    Chronic back pain    followed by pain clinic at [facility]   Depression    hx of depression   GERD (gastroesophageal reflux disease)    Hepatitis    hx of hepatitis C treated with Harvoni per patient   History of kidney stones    Hypertension    Kidney stone    hx of   Pre-diabetes    Stroke ([place]) 02/2018   loss of peripheral vision and balance unsteady   Substance abuse ([place])    heroin    Wears dentures    Has full upper.  Does not wear.    Past Surgical History:  Procedure Laterality Date   ANTERIOR LUMBAR FUSION  05/13/2012   Procedure: ANTERIOR LUMBAR FUSION 1 LEVEL;  Surgeon: Melina Schools, MD;  Location: [place];  Service: Orthopedics;  Laterality: N/A;  ALIF L5-S1   CATARACT EXTRACTION W/PHACO Left 01/01/2021   Procedure: CATARACT EXTRACTION PHACO AND INTRAOCULAR LENS PLACEMENT ([facility]) LEFT DIABETIC 2.28 00:24.7;  Surgeon: Eulogio Bear, MD;  Location: [place];  Service: Ophthalmology;  Laterality: Left;  covid + 11-30-20   CATARACT EXTRACTION W/PHACO Right 01/15/2021   Procedure: CATARACT EXTRACTION PHACO AND INTRAOCULAR LENS PLACEMENT ([place]) RIGHT DIABETIC;  Surgeon: Eulogio Bear, MD;  Location: [place];  Service: Ophthalmology;  Laterality: Right;  2.62 0:34.5   CHOLECYSTECTOMY     CYSTOSCOPY W/ RETROGRADES Left 08/04/2018   Procedure: CYSTOSCOPY WITH RETROGRADE PYELOGRAM;  Surgeon: Abbie Sons, MD;  Location: [facility];  Service: Urology;  Laterality: Left;   CYSTOSCOPY WITH INSERTION OF UROLIFT N/A 01/18/2020   Procedure: CYSTOSCOPY WITH INSERTION OF UROLIFT;  Surgeon: Abbie Sons, MD;  Location: [facility];  Service: Urology;  Laterality: N/A;   CYSTOSCOPY WITH STENT PLACEMENT Left 08/04/2018   Procedure: CYSTOSCOPY WITH STENT PLACEMENT;  Surgeon: Abbie Sons, MD;  Location: [facility];  Service: Urology;  Laterality: Left;   EXTRACORPOREAL SHOCK WAVE LITHOTRIPSY  2012   EXTRACORPOREAL SHOCK WAVE LITHOTRIPSY Left 08/20/2018   Procedure: EXTRACORPOREAL SHOCK WAVE LITHOTRIPSY (ESWL);  Surgeon: Hollice Espy, MD;  Location: [facility];  Service: Urology;  Laterality: Left; (cancelled)   EXTRACORPOREAL SHOCK WAVE LITHOTRIPSY Left 08/27/2018   Procedure: EXTRACORPOREAL SHOCK WAVE LITHOTRIPSY (ESWL);  Surgeon: Abbie Sons, MD;  Location: [facility];  Service: Urology;  Laterality: Left;   EXTRACORPOREAL SHOCK WAVE LITHOTRIPSY Right  10/15/2018   Procedure: EXTRACORPOREAL SHOCK WAVE LITHOTRIPSY (ESWL);  Surgeon: Billey Co, MD;  Location: [facility];  Service: Urology;  Laterality: Right;   KIDNEY STONE SURGERY     LUMBAR FUSION  05/13/2012    Social History:  reports that he has been smoking cigarettes. He has a 13.25 pack-year smoking history. He has never used smokeless tobacco. He reports previous drug use. Drugs: Heroin, Cocaine, and Marijuana. He reports that he does not drink alcohol.  No Known Allergies Family History  Problem Relation Age of Onset   Stroke Mother    Diabetes Mother    Lung cancer Father    Diabetes Father    Family history: Family history reviewed and not pertinent  Prior to Admission medications   Medication Sig Start Date End Date Taking? Authorizing Provider  acetaminophen (TYLENOL) 325 MG tablet Take 2 tablets (650 mg total) by mouth every 6 (six) hours as needed for mild pain (or Fever >/= 101). 10/17/20   Loletha Grayer, MD  amLODipine (NORVASC) 5 MG tablet Take 1 tablet (5 mg total) by mouth daily. 10/18/20   Loletha Grayer, MD  aspirin EC 81 MG EC tablet Take 1 tablet (81 mg total) by mouth daily. Swallow whole. 10/18/20   Loletha Grayer, MD  atorvastatin (LIPITOR) 40 MG tablet Take 40 mg by mouth daily.    [provider]  baclofen (LIORESAL) 10 MG tablet Take 10 mg by mouth.    [provider]  benzonatate (TESSALON) 200 MG capsule Take 200 mg by mouth 3 (three) times daily as needed for cough. Patient not taking: Reported on 01/01/2021    [provider]  buPROPion (WELLBUTRIN XL) 150 MG 24 hr tablet Take 150 mg by mouth daily.    [provider]  Cholecalciferol 25 MCG (1000 UT) tablet Take 1,000 Units by mouth daily.    [provider]  citalopram (CELEXA) 20 MG tablet Take 20 mg by mouth daily. 09/20/20   [provider]  clopidogrel (PLAVIX) 75 MG tablet Take 75 mg by mouth daily.    [provider]   COVID-19 mRNA vaccine, Pfizer, 30 MCG/0.3ML injection USE AS DIRECTED 08/18/20 08/18/21  Carlyle Basques, MD  feeding supplement (ENSURE ENLIVE / ENSURE PLUS) LIQD Take 237 mLs by mouth 2 (two) times daily between meals. 10/17/20   Loletha Grayer, MD  folic acid (FOLVITE) 1 MG tablet Take 1 tablet (1 mg total) by mouth daily. 10/18/20   Loletha Grayer, MD  LORazepam (ATIVAN) 0.5 MG tablet Take 0.5 tablets (0.25 mg total) by mouth daily. 10/18/20   Loletha Grayer, MD  meclizine (ANTIVERT) 25 MG tablet Take 25 mg by mouth 3 (three) times daily as needed for dizziness.    [provider]  melatonin 5 MG TABS Take 0.5 tablets (2.5 mg total) by mouth at bedtime. 10/17/20   Loletha Grayer, MD  menthol-cetylpyridinium (CEPACOL) 3 MG lozenge Take 1 lozenge (3 mg  total) by mouth as needed for sore throat. Patient not taking: Reported on 01/01/2021 10/18/20   Antonieta Pert, MD  mometasone (ELOCON) 0.1 % cream Apply 1 application topically See admin instructions. INTO EARS DAILY Patient not taking: No sig reported    [provider]  Multiple Vitamin (MULTIVITAMIN WITH MINERALS) TABS tablet Take 1 tablet by mouth daily. 10/18/20   Loletha Grayer, MD  nicotine (NICODERM CQ - DOSED IN MG/24 HOURS) 21 mg/24hr patch Place 1 patch (21 mg total) onto the skin daily. 10/17/20   Loletha Grayer, MD  QUEtiapine (SEROQUEL) 200 MG tablet Take 1 tablet (200 mg total) by mouth at bedtime. 10/17/20   Loletha Grayer, MD  risperiDONE (RISPERDAL) 0.5 MG tablet Take 0.5 mg by mouth.    [provider]  tamsulosin (FLOMAX) 0.4 MG CAPS capsule Take 1 capsule (0.4 mg total) by mouth daily. 10/17/20   Loletha Grayer, MD  traZODone (DESYREL) 150 MG tablet Take 1 tablet by mouth at bedtime. 08/04/20   [provider]   Physical Exam: Vitals:   04/05/21 1139 04/05/21 1150 04/05/21 1345 04/05/21 1400  BP: (!) 144/75  (!) 146/83 (!) 151/81  Pulse: (!) 103  80 78  Resp: 18  18 19    Temp: 99.1 F (37.3 C)     TempSrc: Oral     SpO2: 94%  95% 95%  Weight:  67.6 kg    Height:  5' 2"  (1.575 m)     Constitutional: appears age-appropriate, NAD, calm, comfortable Eyes: PERRL, lids and conjunctivae normal ENMT: Mucous membranes are moist. Posterior pharynx clear of any exudate or lesions. Age-appropriate dentition. Hearing appropriate Neck: normal, supple, no masses, no thyromegaly Respiratory: clear to auscultation bilaterally, no wheezing, no crackles. Normal respiratory effort. No accessory muscle use.  Cardiovascular: Regular rate and rhythm, no murmurs / rubs / gallops. No extremity edema. 2+ pedal pulses. No carotid bruits.  Abdomen: no tenderness, no masses palpated, no hepatosplenomegaly. Bowel sounds positive.  Musculoskeletal: no clubbing / cyanosis. No joint deformity upper and lower extremities. Good ROM, no contractures, no atrophy. Normal muscle tone.  Skin: no rashes, lesions, ulcers. No induration Neurologic: Sensation intact. Strength 5/5 in all 4.  Psychiatric: Normal judgment and insight. Alert and oriented x 3. Normal mood.   EKG: Not indicated  Imaging on Admission: I personally reviewed and I agree with radiologist reading as below.  CT Soft Tissue Neck W Contrast  Addendum Date: 04/05/2021   ADDENDUM REPORT: 04/05/2021 14:59 ADDENDUM: These results were called by telephone at the time of interpretation on 04/05/2021 at 2:59 pm to provider [facility] , who verbally acknowledged these results. Electronically Signed   By: Kellie Simmering DO   On: 04/05/2021 14:59   Result Date: 04/05/2021 CLINICAL DATA:  Neck abscess, deep tissue. Additional history provided: Lump to left side of neck behind ear, patient reports this has been present for over a year but now having pain for the past 5 days. Pain upon palpation. EXAM: CT NECK WITH CONTRAST TECHNIQUE: Multidetector CT imaging of the neck was performed using the standard protocol following the bolus  administration of intravenous contrast. CONTRAST:  40m OMNIPAQUE IOHEXOL 300 MG/ML  SOLN COMPARISON:  CT angiogram neck 03/13/2018. FINDINGS: Pharynx and larynx: Poor dentition with many absent teeth. Streak and beam hardening artifact arising from dental restoration partially obscures the oral cavity and oropharynx. No appreciable swelling or discrete mass within the oral cavity, pharynx or larynx. No retropharyngeal collection. Salivary glands: There is a  2.3 x 1.5 cm low-density lesion within the posterior aspect of the left parotid gland. A 1.5 x 1.2 cm hyperdense lesion likely reflecting a primary parotid neoplasm was present at this site on the prior CTA neck of 03/13/2018. On today's examination, there is overlying skin thickening and subcutaneous fat stranding. Ill-defined edema is also present within the adjacent left sternocleidomastoid muscle. The finding on today's examination may reflect superinfection of the previously demonstrated left parotid lesion with abscess. Ovoid lesion within the posterior aspect of the right parotid gland measuring 2.3 x 1.6 cm, unchanged in size as compared to the CTA of 03/13/2018. The submandibular glands are unremarkable. Thyroid: Unremarkable. Lymph nodes: No pathologically enlarged cervical chain lymph nodes are identified. Vascular: The major vascular structures of the neck are patent. Atherosclerotic plaque within the visualized aortic arch and carotid bifurcations. Limited intracranial: No acute intracranial abnormality identified within the field of view. Visualized orbits: Incompletely imaged. No mass or acute finding at the imaged levels. Mastoids and visualized paranasal sinuses: No significant paranasal sinus disease or mastoid effusion at the imaged levels. Skeleton: No acute bony abnormality or aggressive osseous lesion. Reversal of the expected cervical lordosis. Cervical spondylosis. Most notably at C5-C6, there is advanced disc degeneration with a disc  bulge, endplate spurring and bilateral disc osteophyte ridge/uncinate hypertrophy. Resultant bilateral bony neural foraminal narrowing and suspected at least moderate spinal canal stenosis at this level. Upper chest: Peripheral prominence of the interstitial lung markings within the imaged lung apices, progressed as compared to the CTA neck of 03/13/2018. No consolidation within the imaged lung apices. IMPRESSION: 2.3 x 1.5 cm low-density lesion within the posterior left parotid gland. There is overlying skin thickening and surrounding fat stranding. Ill-defined edema is also present within the adjacent left sternocleidomastoid muscle. A 1.5 cm hyperdense lesion compatible with primary parotid neoplasm was present at this site within the posterior left parotid gland on the prior CTA neck of 03/13/2018. Thus, today's findings may reflect interval superinfection of this parotid lesion with abscess. However, malignant degeneration of this lesion cannot be excluded and aspiration/direct tissue sampling is recommended. Unchanged 2.3 x 1.6 cm ovoid lesion within the posterior right parotid gland, likely reflecting a primary parotid neoplasm. No pathologically enlarged cervical chain lymph nodes. Peripheral prominence of the interstitial lung markings within the imaged lung apices, progressed as compared to the prior CTA neck of 03/13/2018. Findings are suspicious for interstitial lung disease. A nonemergent chest CT is recommended for further evaluation, as clinically warranted. Cervical spondylosis, as described and greatest at C5-C6. Electronically Signed: By: Kellie Simmering DO On: 04/05/2021 14:50    Labs on Admission: I have personally reviewed following labs  CBC: Recent Labs  Lab 04/05/21 1153  WBC 15.6*  HGB 14.1  HCT 41.8  MCV 89.7  PLT 203   Basic Metabolic Panel: Recent Labs  Lab 04/05/21 1153  NA 138  K 4.1  CL 101  CO2 25  GLUCOSE 155*  BUN 18  CREATININE 0.88  CALCIUM 9.6    GFR: Estimated Creatinine Clearance: 63.2 mL/min (by C-G formula based on SCr of 0.88 mg/dL).  Urine analysis:    Component Value Date/Time   COLORURINE YELLOW (A) 10/17/2020 1726   APPEARANCEUR HAZY (A) 10/17/2020 1726   APPEARANCEUR Hazy (A) 01/25/2020 1305   LABSPEC 1.020 10/17/2020 1726   PHURINE 7.0 10/17/2020 1726   GLUCOSEU NEGATIVE 10/17/2020 1726   HGBUR NEGATIVE 10/17/2020 1726   BILIRUBINUR NEGATIVE 10/17/2020 1726   BILIRUBINUR Negative 01/25/2020 1305  KETONESUR NEGATIVE 10/17/2020 1726   PROTEINUR NEGATIVE 10/17/2020 1726   NITRITE NEGATIVE 10/17/2020 1726   LEUKOCYTESUR NEGATIVE 10/17/2020 1726   [name] N [name] D.O. [facility]  If 7PM-7AM, please contact overnight-coverage provider If 7AM-7PM, please contact day coverage provider [email or web address]  04/05/2021, 4:18 PM

## 2021-04-05 NOTE — Consult Note (Signed)
Pharmacy Antibiotic Note  [name] is a 75 y.o. male admitted on 04/05/2021 with  right parotid abscess - EDP discussed abx with ENT (Dr. Pryor Ochoa) . Pt presented with lump on L side of neck behind ear. Lump has been present for over a year but is now having pain. CT scan is concerning for abscess of the parotid gland vs degeneration of previously noted Warthin tumor. PMH includes HTN, HLD, bipolar disorder, and stroke. Pharmacy has been consulted for Unasyn dosing.  WBC 15.6, Scr 0.88, afebrile   Plan: Unasyn 3  g IV x 1 dose ordered in ED. Ordered Unasyn 3 g IV q6h to follow.  Monitor renal function  Height: 5\' 2"  (157.5 cm) Weight: 67.6 kg (149 lb) IBW/kg (Calculated) : 54.6  Temp (24hrs), Avg:99.1 F (37.3 C), Min:99.1 F (37.3 C), Max:99.1 F (37.3 C)  Recent Labs  Lab 04/05/21 1153  WBC 15.6*  CREATININE 0.88    Estimated Creatinine Clearance: 63.2 mL/min (by C-G formula based on SCr of 0.88 mg/dL).    No Known Allergies  Antimicrobials this admission: 6/16 Unasyn >>   Microbiology results: N/A  Thank you for allowing pharmacy to be a part of this patient's care.  Benn Moulder, PharmD Pharmacy Resident  04/05/2021 3:35 PM

## 2021-04-05 NOTE — Progress Notes (Signed)
Patient transferred to unit from ED.  No acute distress noted. Patient oriented to room.  Will report off to oncoming nurse.

## 2021-04-05 NOTE — Consult Note (Signed)
Nash, Bolls [id number] 1945-12-02 Cox, Amy N, DO  Reason for Consult: Left parotid abscess  HPI: 75 y.o. male admitted to ER for evaluation of left parotid swelling and pain.  Patient reports he had swelling there for 20+ years but over last 4 to 5 days, the area swelling and began very tender.  He was evaluated and found to have elevated WBC and CT scan showed possible abscess of left sided parotid tumor.  CT from 2019 showed bilateral parotid masses consistent most likely with Warthins tumor.  Patient does have a history of smoking.  He has had difficulty with his pain control while in ER.  Allergies: No Known Allergies  ROS: Review of systems normal other than 12 systems except per HPI.  PMH:  Past Medical History:  Diagnosis Date   Anxiety    Arthritis    DDD, lumbar radiculopathy   Bell's palsy    hx of, right side   Bell's palsy    Chronic back pain    followed by pain clinic at [facility]   Depression    hx of depression   GERD (gastroesophageal reflux disease)    Hepatitis    hx of hepatitis C treated with Harvoni per patient   History of kidney stones    Hypertension    Kidney stone    hx of   Pre-diabetes    Stroke ([place]) 02/2018   loss of peripheral vision and balance unsteady   Substance abuse ([place])    heroin   Wears dentures    Has full upper.  Does not wear.    FH:  Family History  Problem Relation Age of Onset   Stroke Mother    Diabetes Mother    Lung cancer Father    Diabetes Father     SH:  Social History   Socioeconomic History   Marital status: Divorced    Spouse name: Not on file   Number of children: 2   Years of education: Not on file   Highest education level: Associate degree: occupational, Hotel manager, or vocational program  Occupational History   Not on file  Tobacco Use   Smoking status: Every Day    Packs/day: 0.25    Years: 53.00    Pack years: 13.25    Types: Cigarettes   Smokeless tobacco: Never  Vaping Use   Vaping Use:  Never used  Substance and Sexual Activity   Alcohol use: Never    Alcohol/week: 0.0 standard drinks   Drug use: Not Currently    Types: Heroin, Cocaine, Marijuana    Comment: 1960's   Sexual activity: Yes  Other Topics Concern   Not on file  Social History Narrative   ** Merged History Encounter **       Social Determinants of Health   Financial Resource Strain: Not on file  Food Insecurity: Not on file  Transportation Needs: Not on file  Physical Activity: Not on file  Stress: Not on file  Social Connections: Not on file  Intimate Partner Violence: Not on file    PSH:  Past Surgical History:  Procedure Laterality Date   ANTERIOR LUMBAR FUSION  05/13/2012   Procedure: ANTERIOR LUMBAR FUSION 1 LEVEL;  Surgeon: Melina Schools, MD;  Location: [facility];  Service: Orthopedics;  Laterality: N/A;  ALIF L5-S1   CATARACT EXTRACTION W/PHACO Left 01/01/2021   Procedure: CATARACT EXTRACTION PHACO AND INTRAOCULAR LENS PLACEMENT ([facility]) LEFT DIABETIC 2.28 00:24.7;  Surgeon: Eulogio Bear, MD;  Location: [facility];  Service: Ophthalmology;  Laterality: Left;  covid + 11-30-20   CATARACT EXTRACTION W/PHACO Right 01/15/2021   Procedure: CATARACT EXTRACTION PHACO AND INTRAOCULAR LENS PLACEMENT ([place]) RIGHT DIABETIC;  Surgeon: Eulogio Bear, MD;  Location: [facility];  Service: Ophthalmology;  Laterality: Right;  2.62 0:34.5   CHOLECYSTECTOMY     CYSTOSCOPY W/ RETROGRADES Left 08/04/2018   Procedure: CYSTOSCOPY WITH RETROGRADE PYELOGRAM;  Surgeon: Abbie Sons, MD;  Location: [facility];  Service: Urology;  Laterality: Left;   CYSTOSCOPY WITH INSERTION OF UROLIFT N/A 01/18/2020   Procedure: CYSTOSCOPY WITH INSERTION OF UROLIFT;  Surgeon: Abbie Sons, MD;  Location: [facility];  Service: Urology;  Laterality: N/A;   CYSTOSCOPY WITH STENT PLACEMENT Left 08/04/2018   Procedure: CYSTOSCOPY WITH STENT PLACEMENT;  Surgeon: Abbie Sons, MD;  Location: [facility];  Service:  Urology;  Laterality: Left;   EXTRACORPOREAL SHOCK WAVE LITHOTRIPSY  2012   EXTRACORPOREAL SHOCK WAVE LITHOTRIPSY Left 08/20/2018   Procedure: EXTRACORPOREAL SHOCK WAVE LITHOTRIPSY (ESWL);  Surgeon: Hollice Espy, MD;  Location: [facility];  Service: Urology;  Laterality: Left; (cancelled)   EXTRACORPOREAL SHOCK WAVE LITHOTRIPSY Left 08/27/2018   Procedure: EXTRACORPOREAL SHOCK WAVE LITHOTRIPSY (ESWL);  Surgeon: Abbie Sons, MD;  Location: [facility];  Service: Urology;  Laterality: Left;   EXTRACORPOREAL SHOCK WAVE LITHOTRIPSY Right 10/15/2018   Procedure: EXTRACORPOREAL SHOCK WAVE LITHOTRIPSY (ESWL);  Surgeon: Billey Co, MD;  Location: [facility];  Service: Urology;  Laterality: Right;   KIDNEY STONE SURGERY     LUMBAR FUSION  05/13/2012    Physical  Exam:  GEN-  Supine in bed NAD EARS- external ears clear NOSE- clear anteriorly OC/OP-  no masses or lesions NECK-  Left parotid mass and tenderness but beneath ear.  No overlying induration.  No fluctuance, just firmness RESP- unlabored CARD-  RRR  CT-  Bilateral parotid masses but left is now hypointense with fat stranding consistent with possible infected Warthin's tumor  Procedure:  Needle Incision and Drainage of left parotid abscess.  After verbal consent was obtained, the patient's left neck was anesthetized with 62ml of 1% lidocaine with 1:200,000 epinephrine.  An 18 gauge needle as then inserted into the left parotid mass 5 times for aspiration.  This demonstrated a 1/71ml of purulent material.   A/P: Parotid abscess/infected warthin tumor  Plan:  Follow culture and gram stain.  Given elevated WBC and findings on CT recommend admission to Medicine for IV abx for parotid abscess.  Usually patients with these appear much more toxic than current patient, but given this may be more of a phlegmon given no significant fluid on aspiration, it may worsen without more aggressive treatment.  If patient dramatically improves overnight,  may be completed as outpatient therapy.   Salaya Holtrop 04/05/2021 6:18 PM

## 2021-04-06 DIAGNOSIS — K113 Abscess of salivary gland: Secondary | ICD-10-CM | POA: Diagnosis not present

## 2021-04-06 LAB — BASIC METABOLIC PANEL
Anion gap: 9 (ref 5–15)
BUN: 19 mg/dL (ref 8–23)
CO2: 26 mmol/L (ref 22–32)
Calcium: 9.7 mg/dL (ref 8.9–10.3)
Chloride: 103 mmol/L (ref 98–111)
Creatinine, Ser: 0.71 mg/dL (ref 0.61–1.24)
GFR, Estimated: >60 mL/min (ref >60)
Glucose, Bld: 153 mg/dL — ABNORMAL HIGH (ref 70–99)
Potassium: 5.1 mmol/L (ref 3.5–5.1)
Sodium: 138 mmol/L (ref 135–145)

## 2021-04-06 LAB — CBC
HCT: 42.5 % (ref 39.0–52.0)
Hemoglobin: 14.2 g/dL (ref 13.0–17.0)
MCH: 30.5 pg (ref 26.0–34.0)
MCHC: 33.4 g/dL (ref 30.0–36.0)
MCV: 91.2 fL (ref 80.0–100.0)
Platelets: 212 10*3/uL (ref 150–400)
RBC: 4.66 MIL/uL (ref 4.22–5.81)
RDW: 14.9 % (ref 11.5–15.5)
WBC: 12.8 10*3/uL — ABNORMAL HIGH (ref 4.0–10.5)
nRBC: 0 % (ref 0.0–0.2)

## 2021-04-06 MED ORDER — METHYLPREDNISOLONE 4 MG PO TBPK: ORAL_TABLET | ORAL | 0 refills | Status: DC

## 2021-04-06 MED ORDER — AUGMENTIN 875-125 MG PO TABS: 1.0000 | ORAL_TABLET | Freq: Two times a day (BID) | ORAL | 0 refills | Status: AC

## 2021-04-06 MED ORDER — NICOTINE 21 MG/24HR TD PT24: 21.0000 mg | MEDICATED_PATCH | TRANSDERMAL | 0 refills | Status: DC

## 2021-04-06 MED ORDER — OXYCODONE HCL 5 MG PO TABS: 5.0000 mg | ORAL_TABLET | Freq: Three times a day (TID) | ORAL | 0 refills | Status: DC | PRN

## 2021-04-06 NOTE — Plan of Care (Signed)
Discharge teaching completed with patient who is in stable condition. 

## 2021-04-06 NOTE — Plan of Care (Signed)
Continuing with plan of care. 

## 2021-04-06 NOTE — Progress Notes (Signed)
..04/06/2021 8:51 AM  [name] [id number]    Temp:  [98.3 F (36.8 C)-99.1 F (37.3 C)] 98.3 F (36.8 C) (06/17 0408) Pulse Rate:  [70-103] 77 (06/17 0408) Resp:  [16-19] 16 (06/17 0408) BP: (110-158)/(66-92) 136/66 (06/17 0408) SpO2:  [94 %-100 %] 100 % (06/17 0408) Weight:  [67.6 kg] 67.6 kg (06/16 1150),     Intake/Output Summary (Last 24 hours) at 04/06/2021 0851 Last data filed at 04/06/2021 0300 Gross per 24 hour  Intake 100 ml  Output --  Net 100 ml    Results for orders placed or performed during the hospital encounter of 04/05/21 (from the past 24 hour(s))  CBC     Status: Abnormal   Collection Time: 04/05/21 11:53 AM  Result Value Ref Range   WBC 15.6 (H) 4.0 - 10.5 K/uL   RBC 4.66 4.22 - 5.81 MIL/uL   Hemoglobin 14.1 13.0 - 17.0 g/dL   HCT 41.8 39.0 - 52.0 %   MCV 89.7 80.0 - 100.0 fL   MCH 30.3 26.0 - 34.0 pg   MCHC 33.7 30.0 - 36.0 g/dL   RDW 14.9 11.5 - 15.5 %   Platelets 232 150 - 400 K/uL   nRBC 0.0 0.0 - 0.2 %  Basic metabolic panel     Status: Abnormal   Collection Time: 04/05/21 11:53 AM  Result Value Ref Range   Sodium 138 135 - 145 mmol/L   Potassium 4.1 3.5 - 5.1 mmol/L   Chloride 101 98 - 111 mmol/L   CO2 25 22 - 32 mmol/L   Glucose, Bld 155 (H) 70 - 99 mg/dL   BUN 18 8 - 23 mg/dL   Creatinine, Ser 0.88 0.61 - 1.24 mg/dL   Calcium 9.6 8.9 - 10.3 mg/dL   GFR, Estimated >60 >60 mL/min   Anion gap 12 5 - 15  Resp Panel by RT-PCR (Flu A&B, Covid) Nasopharyngeal Swab     Status: None   Collection Time: 04/05/21  3:43 PM   Specimen: Nasopharyngeal Swab; Nasopharyngeal(NP) swabs in vial transport medium  Result Value Ref Range   SARS Coronavirus 2 by RT PCR NEGATIVE NEGATIVE   Influenza A by PCR NEGATIVE NEGATIVE   Influenza B by PCR NEGATIVE NEGATIVE  Basic metabolic panel     Status: Abnormal   Collection Time: 04/06/21  6:16 AM  Result Value Ref Range   Sodium 138 135 - 145 mmol/L   Potassium 5.1 3.5 - 5.1 mmol/L   Chloride 103 98  - 111 mmol/L   CO2 26 22 - 32 mmol/L   Glucose, Bld 153 (H) 70 - 99 mg/dL   BUN 19 8 - 23 mg/dL   Creatinine, Ser 0.71 0.61 - 1.24 mg/dL   Calcium 9.7 8.9 - 10.3 mg/dL   GFR, Estimated >60 >60 mL/min   Anion gap 9 5 - 15  CBC     Status: Abnormal   Collection Time: 04/06/21  6:16 AM  Result Value Ref Range   WBC 12.8 (H) 4.0 - 10.5 K/uL   RBC 4.66 4.22 - 5.81 MIL/uL   Hemoglobin 14.2 13.0 - 17.0 g/dL   HCT 42.5 39.0 - 52.0 %   MCV 91.2 80.0 - 100.0 fL   MCH 30.5 26.0 - 34.0 pg   MCHC 33.4 30.0 - 36.0 g/dL   RDW 14.9 11.5 - 15.5 %   Platelets 212 150 - 400 K/uL   nRBC 0.0 0.0 - 0.2 %    SUBJECTIVE:  No acute events.  Continues to report pain of left neck.  No results on culture taken yesterday from needle I&D.  OBJECTIVE:  GEN- NAD, sitting upright in bed NECK-  continued tenderness and firmness of left parotid mass.  Overlying skin mobile with no induration and no fluctuance.  Decreased in size from yesterday  IMPRESSION:  Parotid abscess/infected Warthin's cyst  PLAN:  Improved WBC and improved exam.  This may be a sterile process from the Warthin's tumor itself given very localized symptoms.  Recommend switch to oral antibiotics and sterapred DS 6 day taper and follow up with me as outpatient early this next week.  Follow culture results.  Raelle Chambers 04/06/2021, 8:51 AM

## 2021-04-06 NOTE — Discharge Summary (Signed)
[facility] Discharge Summary  ZACKARI RUANE WER:[id number] DOB: [birth year] DOA: 04/05/2021  PCP: Tracie Harrier, MD  Admit date: 04/05/2021 Discharge date: 04/06/2021  Admitted From: Home Disposition: Home  Recommendations for Outpatient Follow-up:  Follow up with PCP in 1-2 weeks Follow-up with ENT  Home Health: No Equipment/Devices: None  Discharge Condition: Stable CODE STATUS: Full Diet recommendation: Regular  Brief/Interim Summary: 75 y.o. male with medical history significant for hyperlipidemia, hypertension, major depressive disorder, generalized anxiety disorder, PAD, vascular dementia without behavioral disturbance, psychotic features, presents the emergency department for chief concerns of left-sided neck swelling and pain.   He reports that the pain has been going on for about 1 week, however he states that the lump in his neck has been present for greater than 20 years.  He reports that the pain is a 9 out of 10 and persistent.  He reports that after pain medication it improved to an 7-8.   He denies trauma to the neck area, fever, dysphagia, shortness of breath, chest pain, abdominal pain, odynophagia, cough.  He endorses generalized itchiness, hives for 1 week  Seen in consultation by ENT.  Symptoms consistent with an Wharton's tumor.  Status post incision and drainage at bedside by ENT.  Seen in consultation the following day.  Cleared for discharge from their standpoint.  Recommend steroid taper and empiric oral antibiotics.  These were prescribed on discharge in addition to a small amount of narcotic pain medication.  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  Seen in consultation the following day.  Cleared for discharge from ENT standpoint.  Will discharge home with Medrol Dosepak and empiric oral antibiotics as well as small amount of as needed pain medication.  Patient will follow-up with Dr. Pryor Ochoa from ENT Monday or Tuesday of next week.     Discharge Instructions  Discharge Instructions     Diet - low sodium heart healthy   Complete by: As directed    Increase activity slowly   Complete by: As directed       Allergies as of 04/06/2021   No Known Allergies      Medication List     STOP taking these medications    benzonatate 200 MG capsule Commonly known as: TESSALON   menthol-cetylpyridinium 3 MG lozenge Commonly known as: CEPACOL   mometasone 0.1 % cream Commonly known as: ELOCON   multivitamin with minerals Tabs tablet   traMADol-acetaminophen 37.5-325 MG tablet Commonly known as: ULTRACET       TAKE these medications    amLODipine 5 MG tablet Commonly known as: NORVASC Take 1 tablet (5 mg total) by mouth daily.   aspirin 81 MG EC tablet Take 1 tablet (81 mg total) by mouth daily. Swallow whole.   atorvastatin 40 MG tablet Commonly known as: LIPITOR Take 40 mg by mouth daily.   Augmentin 875-125 MG tablet Generic drug: amoxicillin-clavulanate Take 1 tablet by mouth 2 (two) times daily for 7 days.   baclofen 10 MG tablet Commonly  known as: LIORESAL Take 10 mg by mouth 3 (three) times daily.   buPROPion 150 MG 24 hr tablet Commonly known as: WELLBUTRIN XL Take 150 mg by mouth daily.   Cholecalciferol 25 MCG (1000 UT) tablet Take 1,000 Units by mouth daily.   citalopram 20 MG tablet Commonly known as: CELEXA Take 20 mg by mouth daily.   clopidogrel 75 MG  tablet Commonly known as: PLAVIX Take 75 mg by mouth daily.   feeding supplement Liqd Take 237 mLs by mouth 2 (two) times daily between meals.   folic acid 1 MG tablet Commonly known as: FOLVITE Take 1 tablet (1 mg total) by mouth daily.   furosemide 20 MG tablet Commonly known as: LASIX Take 20 mg by mouth daily.   hydrochlorothiazide 12.5 MG tablet Commonly known as: HYDRODIURIL Take 12.5 mg by mouth daily.   LORazepam 0.5 MG tablet Commonly known as: ATIVAN Take 0.5 tablets (0.25 mg total) by mouth daily.   meclizine 25 MG tablet Commonly known as: ANTIVERT Take 25 mg by mouth 3 (three) times daily as needed for dizziness.   methylPREDNISolone 4 MG Tbpk tablet Commonly known as: MEDROL DOSEPAK Medrol dosepak.  Dispense #1.  No refills.  Take per package instructions   nicotine 21 mg/24hr patch Commonly known as: NICODERM CQ - dosed in mg/24 hours Place 1 patch (21 mg total) onto the skin daily.   oxyCODONE 5 MG immediate release tablet Commonly known as: Roxicodone Take 1 tablet (5 mg total) by mouth every 8 (eight) hours as needed for up to 5 days.   Pfizer-BioNTech COVID-19 Vacc 30 MCG/0.3ML injection Generic drug: COVID-19 mRNA vaccine (Pfizer) USE AS DIRECTED   risperiDONE 0.5 MG tablet Commonly known as: RISPERDAL Take 0.5 mg by mouth.   traZODone 150 MG tablet Commonly known as: DESYREL Take 1 tablet by mouth at bedtime.       ASK your doctor about these medications    acetaminophen 325 MG tablet Commonly known as: TYLENOL Take 2 tablets (650 mg total) by mouth every 6 (six) hours as needed for mild pain (or Fever >/= 101).   melatonin 5 MG Tabs Take 0.5 tablets (2.5 mg total) by mouth at bedtime.   QUEtiapine 200 MG tablet Commonly known as: SEROQUEL Take 1 tablet (200 mg total) by mouth at bedtime.   tamsulosin 0.4 MG Caps capsule Commonly known as: Flomax Take 1 capsule (0.4 mg total) by mouth daily.        No Known  Allergies  Consultations: ENT   Procedures/Studies: CT Soft Tissue Neck W Contrast  Addendum Date: 04/05/2021   ADDENDUM REPORT: 04/05/2021 14:59 ADDENDUM: These results were called by telephone at the time of interpretation on 04/05/2021 at 2:59 pm to provider [facility] , who verbally acknowledged these results. Electronically Signed   By: Kellie Simmering DO   On: 04/05/2021 14:59   Result Date: 04/05/2021 CLINICAL DATA:  Neck abscess, deep tissue. Additional history provided: Lump to left side of neck behind ear, patient reports this has been present for over a year but now having pain for the past 5 days. Pain upon palpation. EXAM: CT NECK WITH CONTRAST TECHNIQUE: Multidetector CT imaging of the neck was performed using the standard protocol following the bolus administration of intravenous contrast. CONTRAST:  87mL OMNIPAQUE IOHEXOL 300 MG/ML  SOLN COMPARISON:  CT angiogram neck 03/13/2018. FINDINGS: Pharynx and larynx: Poor dentition with many absent teeth. Streak and beam hardening artifact arising from dental restoration partially obscures the  oral cavity and oropharynx. No appreciable swelling or discrete mass within the oral cavity, pharynx or larynx. No retropharyngeal collection. Salivary glands: There is a 2.3 x 1.5 cm low-density lesion within the posterior aspect of the left parotid gland. A 1.5 x 1.2 cm hyperdense lesion likely reflecting a primary parotid neoplasm was present at this site on the prior CTA neck of 03/13/2018. On today's examination, there is overlying skin thickening and subcutaneous fat stranding. Ill-defined edema is also present within the adjacent left sternocleidomastoid muscle. The finding on today's examination may reflect superinfection of the previously demonstrated left parotid lesion with abscess. Ovoid lesion within the posterior aspect of the right parotid gland measuring 2.3 x 1.6 cm, unchanged in size as compared to the CTA of 03/13/2018. The submandibular  glands are unremarkable. Thyroid: Unremarkable. Lymph nodes: No pathologically enlarged cervical chain lymph nodes are identified. Vascular: The major vascular structures of the neck are patent. Atherosclerotic plaque within the visualized aortic arch and carotid bifurcations. Limited intracranial: No acute intracranial abnormality identified within the field of view. Visualized orbits: Incompletely imaged. No mass or acute finding at the imaged levels. Mastoids and visualized paranasal sinuses: No significant paranasal sinus disease or mastoid effusion at the imaged levels. Skeleton: No acute bony abnormality or aggressive osseous lesion. Reversal of the expected cervical lordosis. Cervical spondylosis. Most notably at C5-C6, there is advanced disc degeneration with a disc bulge, endplate spurring and bilateral disc osteophyte ridge/uncinate hypertrophy. Resultant bilateral bony neural foraminal narrowing and suspected at least moderate spinal canal stenosis at this level. Upper chest: Peripheral prominence of the interstitial lung markings within the imaged lung apices, progressed as compared to the CTA neck of 03/13/2018. No consolidation within the imaged lung apices. IMPRESSION: 2.3 x 1.5 cm low-density lesion within the posterior left parotid gland. There is overlying skin thickening and surrounding fat stranding. Ill-defined edema is also present within the adjacent left sternocleidomastoid muscle. A 1.5 cm hyperdense lesion compatible with primary parotid neoplasm was present at this site within the posterior left parotid gland on the prior CTA neck of 03/13/2018. Thus, today's findings may reflect interval superinfection of this parotid lesion with abscess. However, malignant degeneration of this lesion cannot be excluded and aspiration/direct tissue sampling is recommended. Unchanged 2.3 x 1.6 cm ovoid lesion within the posterior right parotid gland, likely reflecting a primary parotid neoplasm. No  pathologically enlarged cervical chain lymph nodes. Peripheral prominence of the interstitial lung markings within the imaged lung apices, progressed as compared to the prior CTA neck of 03/13/2018. Findings are suspicious for interstitial lung disease. A nonemergent chest CT is recommended for further evaluation, as clinically warranted. Cervical spondylosis, as described and greatest at C5-C6. Electronically Signed: By: Kellie Simmering DO On: 04/05/2021 14:50   (Echo, Carotid, EGD, Colonoscopy, ERCP)    Subjective: Seen and examined on the day of discharge.  Stable, no distress.  Stable for discharge home.  Discharge Exam: Vitals:   04/06/21 0408 04/06/21 1120  BP: 136/66 138/68  Pulse: 77 82  Resp: 16 15  Temp: 98.3 F (36.8 C) 98.4 F (36.9 C)  SpO2: 100% 99%   Vitals:   04/05/21 1855 04/05/21 1927 04/06/21 0408 04/06/21 1120  BP: (!) 158/82 110/87 136/66 138/68  Pulse: 83 86 77 82  Resp: 18 17 16 15   Temp:  98.5 F (36.9 C) 98.3 F (36.8 C) 98.4 F (36.9 C)  TempSrc:    Oral  SpO2: 99% 98% 100% 99%  Weight:  Height:        General: Pt is alert, awake, not in acute distress Cardiovascular: RRR, S1/S2 +, no rubs, no gallops Respiratory: CTA bilaterally, no wheezing, no rhonchi Abdominal: Soft, NT, ND, bowel sounds + Extremities: no edema, no cyanosis    The results of significant diagnostics from this hospitalization (including imaging, microbiology, ancillary and laboratory) are listed below for reference.     Microbiology: Recent Results (from the past 240 hour(s))  Resp Panel by RT-PCR (Flu A&B, Covid) Nasopharyngeal Swab     Status: None   Collection Time: 04/05/21  3:43 PM   Specimen: Nasopharyngeal Swab; Nasopharyngeal(NP) swabs in vial transport medium  Result Value Ref Range Status   SARS Coronavirus 2 by RT PCR NEGATIVE NEGATIVE Final    Comment: (NOTE) SARS-CoV-2 target nucleic acids are NOT DETECTED.  The SARS-CoV-2 RNA is generally detectable in  upper respiratory specimens during the acute phase of infection. The lowest concentration of SARS-CoV-2 viral copies this assay can detect is 138 copies/mL. A negative result does not preclude SARS-Cov-2 infection and should not be used as the sole basis for treatment or other patient management decisions. A negative result may occur with  improper specimen collection/handling, submission of specimen other than nasopharyngeal swab, presence of viral mutation(s) within the areas targeted by this assay, and inadequate number of viral copies(<138 copies/mL). A negative result must be combined with clinical observations, patient history, and epidemiological information. The expected result is Negative.  Fact Sheet for Patients:  EntrepreneurPulse.com.au  Fact Sheet for Healthcare Providers:  IncredibleEmployment.be  This test is no t yet approved or cleared by the [place] FDA and  has been authorized for detection and/or diagnosis of SARS-CoV-2 by FDA under an Emergency Use Authorization (EUA). This EUA will remain  in effect (meaning this test can be used) for the duration of the COVID-19 declaration under Section 564(b)(1) of the Act, 21 U.S.C.section 360bbb-3(b)(1), unless the authorization is terminated  or revoked sooner.       Influenza A by PCR NEGATIVE NEGATIVE Final   Influenza B by PCR NEGATIVE NEGATIVE Final    Comment: (NOTE) The Xpert Xpress SARS-CoV-2/FLU/RSV plus assay is intended as an aid in the diagnosis of influenza from Nasopharyngeal swab specimens and should not be used as a sole basis for treatment. Nasal washings and aspirates are unacceptable for Xpert Xpress SARS-CoV-2/FLU/RSV testing.  Fact Sheet for Patients: EntrepreneurPulse.com.au  Fact Sheet for Healthcare Providers: IncredibleEmployment.be  This test is not yet approved or cleared by the [place] FDA and has been  authorized for detection and/or diagnosis of SARS-CoV-2 by FDA under an Emergency Use Authorization (EUA). This EUA will remain in effect (meaning this test can be used) for the duration of the COVID-19 declaration under Section 564(b)(1) of the Act, 21 U.S.C. section 360bbb-3(b)(1), unless the authorization is terminated or revoked.  Performed at [facility], [address] [place], [place] [address]   Aerobic/Anaerobic Culture w Gram Stain (surgical/deep wound)     Status: None (Preliminary result)   Collection Time: 04/05/21  6:33 PM   Specimen: Parotid, Left; Abscess  Result Value Ref Range Status   Specimen Description   Final    Left Parotid Performed at [facility], [address], [place], [place] [address]    Special Requests   Final    Normal Performed at [facility], [address] [place],  [address]    Gram Stain   Final    NO  WBC SEEN FEW GRAM POSITIVE COCCI IN PAIRS IN CLUSTERS Performed at [facility], [place] [address], [place], [place] [address]    Culture PENDING  Incomplete   Report Status PENDING  Incomplete     Labs: BNP (last 3 results) No results for input(s): BNP in the last 8760 hours. Basic Metabolic Panel: Recent Labs  Lab 04/05/21 1153 04/06/21 0616  NA 138 138  K 4.1 5.1  CL 101 103  CO2 25 26  GLUCOSE 155* 153*  BUN 18 19  CREATININE 0.88 0.71  CALCIUM 9.6 9.7   Liver Function Tests: No results for input(s): AST, ALT, ALKPHOS, BILITOT, PROT, ALBUMIN in the last 168 hours. No results for input(s): LIPASE, AMYLASE in the last 168 hours. No results for input(s): AMMONIA in the last 168 hours. CBC: Recent Labs  Lab 04/05/21 1153 04/06/21 0616  WBC 15.6* 12.8*  HGB 14.1 14.2  HCT 41.8 42.5  MCV 89.7 91.2  PLT 232 212   Cardiac Enzymes: No results for input(s): CKTOTAL, CKMB, CKMBINDEX, TROPONINI in the last 168 hours. BNP: Invalid input(s): POCBNP CBG: No results for input(s):  GLUCAP in the last 168 hours. D-Dimer No results for input(s): DDIMER in the last 72 hours. Hgb A1c No results for input(s): HGBA1C in the last 72 hours. Lipid Profile No results for input(s): CHOL, HDL, LDLCALC, TRIG, CHOLHDL, LDLDIRECT in the last 72 hours. Thyroid function studies No results for input(s): TSH, T4TOTAL, T3FREE, THYROIDAB in the last 72 hours.  Invalid input(s): FREET3 Anemia work up No results for input(s): VITAMINB12, FOLATE, FERRITIN, TIBC, IRON, RETICCTPCT in the last 72 hours. Urinalysis    Component Value Date/Time   COLORURINE YELLOW (A) 10/17/2020 1726   APPEARANCEUR HAZY (A) 10/17/2020 1726   APPEARANCEUR Hazy (A) 01/25/2020 1305   LABSPEC 1.020 10/17/2020 1726   PHURINE 7.0 10/17/2020 1726   GLUCOSEU NEGATIVE 10/17/2020 1726   HGBUR NEGATIVE 10/17/2020 1726   BILIRUBINUR NEGATIVE 10/17/2020 1726   BILIRUBINUR Negative 01/25/2020 [place] 10/17/2020 1726   PROTEINUR NEGATIVE 10/17/2020 1726   NITRITE NEGATIVE 10/17/2020 1726   LEUKOCYTESUR NEGATIVE 10/17/2020 1726   Sepsis Labs Invalid input(s): PROCALCITONIN,  WBC,  LACTICIDVEN Microbiology Recent Results (from the past 240 hour(s))  Resp Panel by RT-PCR (Flu A&B, Covid) Nasopharyngeal Swab     Status: None   Collection Time: 04/05/21  3:43 PM   Specimen: Nasopharyngeal Swab; Nasopharyngeal(NP) swabs in vial transport medium  Result Value Ref Range Status   SARS Coronavirus 2 by RT PCR NEGATIVE NEGATIVE Final    Comment: (NOTE) SARS-CoV-2 target nucleic acids are NOT DETECTED.  The SARS-CoV-2 RNA is generally detectable in upper respiratory specimens during the acute phase of infection. The lowest concentration of SARS-CoV-2 viral copies this assay can detect is 138 copies/mL. A negative result does not preclude SARS-Cov-2 infection and should not be used as the sole basis for treatment or other patient management decisions. A negative result may occur with  improper  specimen collection/handling, submission of specimen other than nasopharyngeal swab, presence of viral mutation(s) within the areas targeted by this assay, and inadequate number of viral copies(<138 copies/mL). A negative result must be combined with clinical observations, patient history, and epidemiological information. The expected result is Negative.  Fact Sheet for Patients:  EntrepreneurPulse.com.au  Fact Sheet for Healthcare Providers:  IncredibleEmployment.be  This test is no t yet approved or cleared by the [place] FDA and  has been authorized for detection and/or diagnosis  of SARS-CoV-2 by FDA under an Emergency Use Authorization (EUA). This EUA will remain  in effect (meaning this test can be used) for the duration of the COVID-19 declaration under Section 564(b)(1) of the Act, 21 U.S.C.section 360bbb-3(b)(1), unless the authorization is terminated  or revoked sooner.       Influenza A by PCR NEGATIVE NEGATIVE Final   Influenza B by PCR NEGATIVE NEGATIVE Final    Comment: (NOTE) The Xpert Xpress SARS-CoV-2/FLU/RSV plus assay is intended as an aid in the diagnosis of influenza from Nasopharyngeal swab specimens and should not be used as a sole basis for treatment. Nasal washings and aspirates are unacceptable for Xpert Xpress SARS-CoV-2/FLU/RSV testing.  Fact Sheet for Patients: EntrepreneurPulse.com.au  Fact Sheet for Healthcare Providers: IncredibleEmployment.be  This test is not yet approved or cleared by the [place] FDA and has been authorized for detection and/or diagnosis of SARS-CoV-2 by FDA under an Emergency Use Authorization (EUA). This EUA will remain in effect (meaning this test can be used) for the duration of the COVID-19 declaration under Section 564(b)(1) of the Act, 21 U.S.C. section 360bbb-3(b)(1), unless the authorization is terminated or revoked.  Performed at  [facility], [address] [place], [place] [address]   Aerobic/Anaerobic Culture w Gram Stain (surgical/deep wound)     Status: None (Preliminary result)   Collection Time: 04/05/21  6:33 PM   Specimen: Parotid, Left; Abscess  Result Value Ref Range Status   Specimen Description   Final    Left Parotid Performed at [facility], [address] [place], [place] [address]    Special Requests   Final    Normal Performed at [facility], [place], [place] [address]    Gram Stain   Final    NO WBC SEEN FEW GRAM POSITIVE COCCI IN PAIRS IN CLUSTERS Performed at [facility], [place] [address], [place], [place] [address]    Culture PENDING  Incomplete   Report Status PENDING  Incomplete     Time coordinating discharge: Over 30 minutes  SIGNED:   Sidney Ace, MD  [facility] 04/06/2021, 3:08 PM Pager   If 7PM-7AM, please contact night-coverage

## 2021-04-10 ENCOUNTER — Other Ambulatory Visit: Payer: Self-pay

## 2021-04-10 ENCOUNTER — Emergency Department
Admission: EM | Admit: 2021-04-10 | Discharge: 2021-04-10 | Disposition: A | Discharge status: Home / Self Care | Payer: Medicare Other | Attending: Emergency Medicine | Admitting: Emergency Medicine

## 2021-04-10 DIAGNOSIS — Z79899 Other long term (current) drug therapy: Secondary | ICD-10-CM | POA: Insufficient documentation

## 2021-04-10 DIAGNOSIS — F17210 Nicotine dependence, cigarettes, uncomplicated: Secondary | ICD-10-CM | POA: Insufficient documentation

## 2021-04-10 DIAGNOSIS — K1120 Sialoadenitis, unspecified: Secondary | ICD-10-CM | POA: Diagnosis not present

## 2021-04-10 DIAGNOSIS — Z7982 Long term (current) use of aspirin: Secondary | ICD-10-CM | POA: Insufficient documentation

## 2021-04-10 DIAGNOSIS — I10 Essential (primary) hypertension: Secondary | ICD-10-CM | POA: Diagnosis not present

## 2021-04-10 LAB — CBC WITH DIFFERENTIAL/PLATELET
Abs Immature Granulocytes: 0.09 10*3/uL — ABNORMAL HIGH (ref 0.00–0.07)
Basophils Absolute: 0.1 10*3/uL (ref 0.0–0.1)
Basophils Relative: 0 %
Eosinophils Absolute: 0.3 10*3/uL (ref 0.0–0.5)
Eosinophils Relative: 1 %
HCT: 45.1 % (ref 39.0–52.0)
Hemoglobin: 14.9 g/dL (ref 13.0–17.0)
Immature Granulocytes: 1 %
Lymphocytes Relative: 7 %
Lymphs Abs: 1.2 10*3/uL (ref 0.7–4.0)
MCH: 30 pg (ref 26.0–34.0)
MCHC: 33 g/dL (ref 30.0–36.0)
MCV: 90.7 fL (ref 80.0–100.0)
Monocytes Absolute: 0.6 10*3/uL (ref 0.1–1.0)
Monocytes Relative: 3 %
Neutro Abs: 16.4 10*3/uL — ABNORMAL HIGH (ref 1.7–7.7)
Neutrophils Relative %: 88 %
Platelets: 256 10*3/uL (ref 150–400)
RBC: 4.97 MIL/uL (ref 4.22–5.81)
RDW: 14.8 % (ref 11.5–15.5)
WBC: 18.7 10*3/uL — ABNORMAL HIGH (ref 4.0–10.5)
nRBC: 0 % (ref 0.0–0.2)

## 2021-04-10 LAB — BASIC METABOLIC PANEL
Anion gap: 11 (ref 5–15)
BUN: 27 mg/dL — ABNORMAL HIGH (ref 8–23)
CO2: 23 mmol/L (ref 22–32)
Calcium: 9.5 mg/dL (ref 8.9–10.3)
Chloride: 100 mmol/L (ref 98–111)
Creatinine, Ser: 0.94 mg/dL (ref 0.61–1.24)
GFR, Estimated: >60 mL/min (ref >60)
Glucose, Bld: 193 mg/dL — ABNORMAL HIGH (ref 70–99)
Potassium: 3.7 mmol/L (ref 3.5–5.1)
Sodium: 134 mmol/L — ABNORMAL LOW (ref 135–145)

## 2021-04-10 MED ORDER — IBUPROFEN 800 MG PO TABS: 800.0000 mg | ORAL_TABLET | Freq: Three times a day (TID) | ORAL | 0 refills | Status: DC | PRN

## 2021-04-10 MED ORDER — AMOXICILLIN-POT CLAVULANATE 875-125 MG PO TABS: 1.0000 | ORAL_TABLET | Freq: Two times a day (BID) | ORAL | 0 refills | Status: AC

## 2021-04-10 MED ORDER — CEFTRIAXONE SODIUM 1 G IJ SOLR
1.0000 g | Freq: Once | INTRAMUSCULAR | Status: AC
  Administered 2021-04-10: 1 g via INTRAMUSCULAR
  Filled 2021-04-10: qty 10

## 2021-04-10 MED ORDER — OXYCODONE-ACETAMINOPHEN 7.5-325 MG PO TABS: 1.0000 | ORAL_TABLET | Freq: Three times a day (TID) | ORAL | 0 refills | Status: AC | PRN

## 2021-04-10 NOTE — ED Notes (Signed)
See triage note  Presents with swelling to left side of neck  States this "growth" has been there for several years  States he was seen last week for same

## 2021-04-10 NOTE — ED Triage Notes (Signed)
Pt c/o abscess to the left side of his neck and was seen here last Thursday for same, states they attempted to drain it and he taking abx.

## 2021-04-10 NOTE — Discharge Instructions (Addendum)
Read and follow discharge care instruction.  Take medication as directed.  It is very important follow-up with schedule appointment with the ENT clinic on date indicated in your discharge care instructions.  Return back to ED if condition worsens before your appointment.

## 2021-04-10 NOTE — ED Provider Notes (Signed)
[facility] Emergency Department Provider Note   ____________________________________________   Event Date/Time   First MD Initiated Contact with Patient 04/10/21 [phone]     (approximate)  I have reviewed the triage vital signs and the nursing notes.   HISTORY  Chief Complaint Abscess    HPI [name] is a 75 y.o. male patient presents for reevaluation of left parotid lesion that was attempted incision and drainage by Dr. Pryor Ochoa in the ED on 6 04/05/2021.  Patient was supposed to follow-up with Dr. Pryor Ochoa at his clinic.  Patient stated he did not receive the information or did not understand the information for follow-up and he was to be seen in the emergency department today.     Past Medical History:  Diagnosis Date   Anxiety    Arthritis    DDD, lumbar radiculopathy   Bell's palsy    hx of, right side   Bell's palsy    Chronic back pain    followed by pain clinic at [facility]   Depression    hx of depression   GERD (gastroesophageal reflux disease)    Hepatitis    hx of hepatitis C treated with Harvoni per patient   History of kidney stones    Hypertension    Kidney stone    hx of   Pre-diabetes    Stroke ([place]) 02/2018   loss of peripheral vision and balance unsteady   Substance abuse ([place])    heroin   Wears dentures    Has full upper.  Does not wear.    Patient Active Problem List   Diagnosis Date Noted   Parotid abscess 04/05/2021   Overdose of benzodiazepine 10/16/2020   Bipolar depression ([place]) 29/47/6546   Acute metabolic encephalopathy    Acute cystitis with hematuria    Weakness    AMS (altered mental status) 10/10/2020   AKI (acute kidney injury) ([place])    Benign prostatic hyperplasia without lower urinary tract symptoms 12/31/2019   Ataxia 03/09/2019   Loss of peripheral visual field, bilateral 03/09/2019   Seizure-like activity ([place]) 03/09/2019   Hyperlipidemia 11/25/2018   Bilateral carotid artery stenosis  11/25/2018   Failed back syndrome, lumbar 07/23/2018   Nephrolithiasis 07/08/2018   History of CVA (cerebrovascular accident) 05/22/2018   Visual disturbance 05/22/2018   CVA (cerebral vascular accident) ([place]) 03/12/2018   PAD (peripheral artery disease) ([place]) 04/20/2017   Chronic hepatitis C without hepatic coma ([place]) 03/12/2017   Pain and swelling of lower extremity 01/14/2016   Chronic hip pain (Right) 01/14/2016   Osteoarthritis of hip (Right) 01/14/2016   Generalized anxiety disorder 01/14/2016   Anxiety and depression 01/14/2016   Lumbar facet syndrome 01/14/2016   Clinical depression 01/11/2016   Essential hypertension 01/11/2016   Insomnia, psychophysiological 01/11/2016   Current tobacco use 01/11/2016   Chronic low back pain (Location of Primary Source of Pain) ([place]) 01/11/2016   Chronic pain 01/11/2016   Lumbar spondylosis 01/11/2016   Long term current use of opiate analgesic 01/11/2016   Long term prescription opiate use 01/11/2016   Opiate use (15 MME/Day) 01/11/2016   Encounter for therapeutic drug level monitoring 01/11/2016   Encounter for pain management planning 01/11/2016   Failed back surgical syndrome (S/P anterior fusion at L5-S1) 01/11/2016   Lumbar facet arthropathy 01/11/2016   Lumbosacral radiculitis 08/16/2014   DDD (degenerative disc disease), lumbar 08/16/2014   Discogenic low back pain 04/27/2012    Past Surgical History:  Procedure Laterality Date  ANTERIOR LUMBAR FUSION  05/13/2012   Procedure: ANTERIOR LUMBAR FUSION 1 LEVEL;  Surgeon: Melina Schools, MD;  Location: [place];  Service: Orthopedics;  Laterality: N/A;  ALIF L5-S1   CATARACT EXTRACTION W/PHACO Left 01/01/2021   Procedure: CATARACT EXTRACTION PHACO AND INTRAOCULAR LENS PLACEMENT (IOC) LEFT DIABETIC 2.28 00:24.7;  Surgeon: Eulogio Bear, MD;  Location: [place];  Service: Ophthalmology;  Laterality: Left;  covid + 11-30-20   CATARACT EXTRACTION W/PHACO Right 01/15/2021    Procedure: CATARACT EXTRACTION PHACO AND INTRAOCULAR LENS PLACEMENT ([place]) RIGHT DIABETIC;  Surgeon: Eulogio Bear, MD;  Location: [place];  Service: Ophthalmology;  Laterality: Right;  2.62 0:34.5   CHOLECYSTECTOMY     CYSTOSCOPY W/ RETROGRADES Left 08/04/2018   Procedure: CYSTOSCOPY WITH RETROGRADE PYELOGRAM;  Surgeon: Abbie Sons, MD;  Location: [facility];  Service: Urology;  Laterality: Left;   CYSTOSCOPY WITH INSERTION OF UROLIFT N/A 01/18/2020   Procedure: CYSTOSCOPY WITH INSERTION OF UROLIFT;  Surgeon: Abbie Sons, MD;  Location: [facility];  Service: Urology;  Laterality: N/A;   CYSTOSCOPY WITH STENT PLACEMENT Left 08/04/2018   Procedure: CYSTOSCOPY WITH STENT PLACEMENT;  Surgeon: Abbie Sons, MD;  Location: [facility];  Service: Urology;  Laterality: Left;   EXTRACORPOREAL SHOCK WAVE LITHOTRIPSY  2012   EXTRACORPOREAL SHOCK WAVE LITHOTRIPSY Left 08/20/2018   Procedure: EXTRACORPOREAL SHOCK WAVE LITHOTRIPSY (ESWL);  Surgeon: Hollice Espy, MD;  Location: [facility];  Service: Urology;  Laterality: Left; (cancelled)   EXTRACORPOREAL SHOCK WAVE LITHOTRIPSY Left 08/27/2018   Procedure: EXTRACORPOREAL SHOCK WAVE LITHOTRIPSY (ESWL);  Surgeon: Abbie Sons, MD;  Location: [facility];  Service: Urology;  Laterality: Left;   EXTRACORPOREAL SHOCK WAVE LITHOTRIPSY Right 10/15/2018   Procedure: EXTRACORPOREAL SHOCK WAVE LITHOTRIPSY (ESWL);  Surgeon: Billey Co, MD;  Location: [facility];  Service: Urology;  Laterality: Right;   KIDNEY STONE SURGERY     LUMBAR FUSION  05/13/2012    Prior to Admission medications   Medication Sig Start Date End Date Taking? Authorizing Provider  amoxicillin-clavulanate (AUGMENTIN) 875-125 MG tablet Take 1 tablet by mouth 2 (two) times daily for 10 days. 04/10/21 04/20/21 Yes Sable Feil, PA-C  ibuprofen (ADVIL) 800 MG tablet Take 1 tablet (800 mg total) by mouth every 8 (eight) hours as needed. 04/10/21  Yes Sable Feil, PA-C   oxyCODONE-acetaminophen (PERCOCET) 7.5-325 MG tablet Take 1 tablet by mouth every 8 (eight) hours as needed for up to 5 days. 04/10/21 04/15/21 Yes Sable Feil, PA-C  acetaminophen (TYLENOL) 325 MG tablet Take 2 tablets (650 mg total) by mouth every 6 (six) hours as needed for mild pain (or Fever >/= 101). Patient not taking: Reported on 04/05/2021 10/17/20   Loletha Grayer, MD  amLODipine (NORVASC) 5 MG tablet Take 1 tablet (5 mg total) by mouth daily. 10/18/20   Loletha Grayer, MD  aspirin EC 81 MG EC tablet Take 1 tablet (81 mg total) by mouth daily. Swallow whole. 10/18/20   Loletha Grayer, MD  atorvastatin (LIPITOR) 40 MG tablet Take 40 mg by mouth daily.    [provider]  AUGMENTIN 875-125 MG tablet Take 1 tablet by mouth 2 (two) times daily for 7 days. 04/06/21 04/13/21  Sidney Ace, MD  baclofen (LIORESAL) 10 MG tablet Take 10 mg by mouth 3 (three) times daily.    [provider]  buPROPion (WELLBUTRIN XL) 150 MG 24 hr tablet Take 150 mg by mouth daily.    [provider]  Cholecalciferol 25 MCG (1000 UT) tablet Take 1,000 Units by mouth daily.    [provider]  citalopram (CELEXA) 20 MG tablet Take 20 mg by mouth daily. 09/20/20   [provider]  clopidogrel (PLAVIX) 75 MG tablet Take 75 mg by mouth daily.    [provider]  COVID-19 mRNA vaccine, Pfizer, 30 MCG/0.3ML injection USE AS DIRECTED 08/18/20 08/18/21  Carlyle Basques, MD  feeding supplement (ENSURE ENLIVE / ENSURE PLUS) LIQD Take 237 mLs by mouth 2 (two) times daily between meals. 10/17/20   Loletha Grayer, MD  folic acid (FOLVITE) 1 MG tablet Take 1 tablet (1 mg total) by mouth daily. 10/18/20   Loletha Grayer, MD  furosemide (LASIX) 20 MG tablet Take 20 mg by mouth daily. 01/22/21   [provider]  hydrochlorothiazide (HYDRODIURIL) 12.5 MG tablet Take 12.5 mg by mouth daily. 02/02/21   [provider]  LORazepam (ATIVAN) 0.5 MG tablet  Take 0.5 tablets (0.25 mg total) by mouth daily. 10/18/20   Loletha Grayer, MD  meclizine (ANTIVERT) 25 MG tablet Take 25 mg by mouth 3 (three) times daily as needed for dizziness.    [provider]  melatonin 5 MG TABS Take 0.5 tablets (2.5 mg total) by mouth at bedtime. Patient taking differently: Take 5 mg by mouth at bedtime. 10/17/20   Loletha Grayer, MD  methylPREDNISolone (MEDROL DOSEPAK) 4 MG TBPK tablet Medrol dosepak.  Dispense #1.  No refills.  Take per package instructions 04/06/21   Sidney Ace, MD  nicotine (NICODERM CQ - DOSED IN MG/24 HOURS) 21 mg/24hr patch Place 1 patch (21 mg total) onto the skin daily. 04/06/21   Sidney Ace, MD  QUEtiapine (SEROQUEL) 200 MG tablet Take 1 tablet (200 mg total) by mouth at bedtime. Patient taking differently: Take 300 mg by mouth at bedtime. 10/17/20   Loletha Grayer, MD  risperiDONE (RISPERDAL) 0.5 MG tablet Take 0.5 mg by mouth.    [provider]  traZODone (DESYREL) 150 MG tablet Take 1 tablet by mouth at bedtime. 08/04/20   [provider]    Allergies Patient has no known allergies.  Family History  Problem Relation Age of Onset   Stroke Mother    Diabetes Mother    Lung cancer Father    Diabetes Father     Social History Social History   Tobacco Use   Smoking status: Every Day    Packs/day: 0.25    Years: 53.00    Pack years: 13.25    Types: Cigarettes   Smokeless tobacco: Never  Vaping Use   Vaping Use: Never used  Substance Use Topics   Alcohol use: Never    Alcohol/week: 0.0 standard drinks   Drug use: Not Currently    Types: Heroin, Cocaine, Marijuana    Comment: 1960's    Review of Systems  Constitutional: No fever/chills Eyes: No visual changes. ENT: No sore throat. Cardiovascular: Denies chest pain. Respiratory: Denies shortness of breath. Gastrointestinal: No abdominal pain.  No nausea, no vomiting.  No diarrhea.  No constipation. Genitourinary:  Negative for dysuria.  BPH Musculoskeletal: Negative for back pain. Skin: Negative for rash.  Nodule lesion left parotid area Neurological: Negative for headaches, focal weakness or numbness. Psychiatric: Anxiety, bipolar, depression. Endocrine: Hypertension  ____________________________________________   PHYSICAL EXAM:  VITAL SIGNS: ED Triage Vitals  Enc Vitals Group     BP 04/10/21 0907 (!) 142/74     Pulse Rate 04/10/21 0907 99     Resp  04/10/21 0907 16     Temp 04/10/21 0907 99.1 F (37.3 C)     Temp Source 04/10/21 0907 Oral     SpO2 04/10/21 0907 98 %     Weight 04/10/21 0908 147 lb (66.7 kg)     Height 04/10/21 0908 5\' 2"  (1.575 m)     Head Circumference --      Peak Flow --      Pain Score 04/10/21 0907 9     Pain Loc --      Pain Edu? --      Excl. in [place]? --     Constitutional: Alert and oriented. Well appearing and in no acute distress. Eyes: Conjunctivae are normal. PERRL. EOMI. Head: Atraumatic. Nose: No congestion/rhinnorhea. Mouth/Throat: Mucous membranes are moist.  Oropharynx non-erythematous. Neck: No stridor.  Hematological/Lymphatic/Immunilogical: No cervical lymphadenopathy. Cardiovascular: Normal rate, regular rhythm. Grossly normal heart sounds.  Good peripheral circulation. Respiratory: Normal respiratory effort.  No retractions. Lungs CTAB. Neurologic:  Normal speech and language. No gross focal neurologic deficits are appreciated. No gait instability. Skin:  Skin is warm, dry and intact. No rash noted. Psychiatric: Mood and affect are normal. Speech and behavior are normal.  ____________________________________________   LABS (all labs ordered are listed, but only abnormal results are displayed)  Labs Reviewed  BASIC METABOLIC PANEL - Abnormal; Notable for the following components:      Result Value   Sodium 134 (*)    Glucose, Bld 193 (*)    BUN 27 (*)    All other components within normal limits  CBC WITH DIFFERENTIAL/PLATELET -  Abnormal; Notable for the following components:   WBC 18.7 (*)    Neutro Abs 16.4 (*)    Abs Immature Granulocytes 0.09 (*)    All other components within normal limits   ____________________________________________  EKG   ____________________________________________  RADIOLOGY I, Sable Feil, personally viewed and evaluated these images (plain radiographs) as part of my medical decision making, as well as reviewing the written report by the radiologist.  ED MD interpretation:    Official radiology report(s): No results found.  ____________________________________________   PROCEDURES  Procedure(s) performed (including Critical Care):  Procedures   ____________________________________________   INITIAL IMPRESSION / ASSESSMENT AND PLAN / ED COURSE  As part of my medical decision making, I reviewed the following data within the [place]         Patient presents for reevaluation left parotid.  Discussed patient's with Dr. Darien Ramus clinic.  Confirmed appointment for July 7 at 1 PM.  Discussed patient lab results with patient and recommend IV antibiotics and a continuation of oral antibiotics.  Patient refused IV antibiotics stating he has 2 other appointments today.  Patient was amenable to IM antibiotics restarted and Augmentin.  Discussed the importance of following up with ENT on schedule today.  Patient given discharge care instructions.  Advised return right ED if condition worsens before ENT visit.      ____________________________________________   FINAL CLINICAL IMPRESSION(S) / ED DIAGNOSES  Final diagnoses:  Parotiditis     ED Discharge Orders          Ordered    amoxicillin-clavulanate (AUGMENTIN) 875-125 MG tablet  2 times daily        04/10/21 1154    oxyCODONE-acetaminophen (PERCOCET) 7.5-325 MG tablet  Every 8 hours PRN        04/10/21 1154    ibuprofen (ADVIL) 800 MG tablet  Every 8 hours PRN  04/10/21 1154              Note:  This document was prepared using Dragon voice recognition software and may include unintentional dictation errors.    Sable Feil, PA-C 04/10/21 1225    Harvest Dark, MD 04/10/21 [phone]

## 2021-04-11 LAB — AEROBIC/ANAEROBIC CULTURE W GRAM STAIN (SURGICAL/DEEP WOUND)
Gram Stain: NONE SEEN
Special Requests: NORMAL

## 2021-07-11 ENCOUNTER — Ambulatory Visit: Admission: RE | Admit: 2021-07-11 | Payer: Medicare Other | Source: Home / Self Care | Admitting: Internal Medicine

## 2021-07-11 ENCOUNTER — Encounter
Admission: RE | Disposition: A | Discharge status: Home / Self Care | Payer: Self-pay | Source: Home / Self Care | Attending: Internal Medicine

## 2021-07-11 ENCOUNTER — Encounter: Admission: RE | Payer: Self-pay | Source: Home / Self Care

## 2021-07-11 ENCOUNTER — Ambulatory Visit: Payer: Medicare Other | Admitting: Anesthesiology

## 2021-07-11 ENCOUNTER — Ambulatory Visit
Admission: RE | Admit: 2021-07-11 | Discharge: 2021-07-11 | Disposition: A | Discharge status: Home / Self Care | Payer: Medicare Other | Source: Home / Self Care | Attending: Internal Medicine | Admitting: Internal Medicine

## 2021-07-11 SURGERY — COLONOSCOPY
Anesthesia: General

## 2021-07-11 SURGERY — COLONOSCOPY WITH PROPOFOL
Anesthesia: General

## 2021-07-11 MED ORDER — SODIUM CHLORIDE 0.9 % IV SOLN: INTRAVENOUS | Status: DC

## 2021-07-11 NOTE — Anesthesia Preprocedure Evaluation (Deleted)
Anesthesia Evaluation  Patient identified by MRN, date of birth, ID band Patient awake    Reviewed: Allergy & Precautions, NPO status , Patient's Chart, lab work & pertinent test results  Airway Mallampati: II  TM Distance: >3 FB     Dental no notable dental hx.    Pulmonary Current Smoker and Patient abstained from smoking.,    Pulmonary exam normal breath sounds clear to auscultation       Cardiovascular hypertension, Pt. on medications + Peripheral Vascular Disease  Normal cardiovascular exam Rhythm:Regular Rate:Normal     Neuro/Psych PSYCHIATRIC DISORDERS Anxiety Depression Bipolar Disorder Hx substance abuse Neuromuscular disease CVA (2019)    GI/Hepatic GERD  ,(+) Hepatitis - (C - treated with Harvoni)  Endo/Other  negative endocrine ROS  Renal/GU Renal diseaseHx kidney stones     Musculoskeletal  (+) Arthritis ,   Abdominal Normal abdominal exam  (+) - obese,   Peds  Hematology negative hematology ROS (+)   Anesthesia Other Findings   Reproductive/Obstetrics                             Anesthesia Physical  Anesthesia Plan  ASA: III  Anesthesia Plan: General   Post-op Pain Management:    Induction: Intravenous  PONV Risk Score and Plan: TIVA and Treatment may vary due to age or medical condition  Airway Management Planned: Natural Airway and Nasal Cannula  Additional Equipment:   Intra-op Plan:   Post-operative Plan:   Informed Consent: I have reviewed the patients History and Physical, chart, labs and discussed the procedure including the risks, benefits and alternatives for the proposed anesthesia with the patient or authorized representative who has indicated his/her understanding and acceptance.     Dental advisory given  Plan Discussed with: CRNA  Anesthesia Plan Comments:         Anesthesia Quick Evaluation Patient Active Problem List   Diagnosis  Date Noted  . Parotid abscess 04/05/2021  . Overdose of benzodiazepine 10/16/2020  . Bipolar depression ([place]) 10/16/2020  . Acute metabolic encephalopathy   . Acute cystitis with hematuria   . Weakness   . AMS (altered mental status) 10/10/2020  . AKI (acute kidney injury) ([place])   . Benign prostatic hyperplasia without lower urinary tract symptoms 12/31/2019  . Ataxia 03/09/2019  . Loss of peripheral visual field, bilateral 03/09/2019  . Seizure-like activity ([place]) 03/09/2019  . Hyperlipidemia 11/25/2018  . Bilateral carotid artery stenosis 11/25/2018  . Failed back syndrome, lumbar 07/23/2018  . Nephrolithiasis 07/08/2018  . History of CVA (cerebrovascular accident) 05/22/2018  . Visual disturbance 05/22/2018  . CVA (cerebral vascular accident) ([place]) 03/12/2018  . PAD (peripheral artery disease) ([place]) 04/20/2017  . Chronic hepatitis C without hepatic coma ([place]) 03/12/2017  . Pain and swelling of lower extremity 01/14/2016  . Chronic hip pain (Right) 01/14/2016  . Osteoarthritis of hip (Right) 01/14/2016  . Generalized anxiety disorder 01/14/2016  . Anxiety and depression 01/14/2016  . Lumbar facet syndrome 01/14/2016  . Clinical depression 01/11/2016  . Essential hypertension 01/11/2016  . Insomnia, psychophysiological 01/11/2016  . Current tobacco use 01/11/2016  . Chronic low back pain (Location of Primary Source of Pain) ([place]) 01/11/2016  . Chronic pain 01/11/2016  . Lumbar spondylosis 01/11/2016  . Long term current use of opiate analgesic 01/11/2016  . Long term prescription opiate use 01/11/2016  . Opiate use (15 MME/Day) 01/11/2016  . Encounter for therapeutic drug level monitoring 01/11/2016  .  Encounter for pain management planning 01/11/2016  . Failed back surgical syndrome (S/P anterior fusion at L5-S1) 01/11/2016  . Lumbar facet arthropathy 01/11/2016  . Lumbosacral radiculitis 08/16/2014  . DDD (degenerative disc disease), lumbar 08/16/2014  . Discogenic  low back pain 04/27/2012    CBC Latest Ref Rng & Units 04/10/2021 04/06/2021 04/05/2021  WBC 4.0 - 10.5 K/uL 18.7(H) 12.8(H) 15.6(H)  Hemoglobin 13.0 - 17.0 g/dL 14.9 14.2 14.1  Hematocrit 39.0 - 52.0 % 45.1 42.5 41.8  Platelets 150 - 400 K/uL 256 212 232   BMP Latest Ref Rng & Units 04/10/2021 04/06/2021 04/05/2021  Glucose 70 - 99 mg/dL 193(H) 153(H) 155(H)  BUN 8 - 23 mg/dL 27(H) 19 18  Creatinine 0.61 - 1.24 mg/dL 0.94 0.71 0.88  BUN/Creat Ratio 10 - 24 - - -  Sodium 135 - 145 mmol/L 134(L) 138 138  Potassium 3.5 - 5.1 mmol/L 3.7 5.1 4.1  Chloride 98 - 111 mmol/L 100 103 101  CO2 22 - 32 mmol/L 23 26 25   Calcium 8.9 - 10.3 mg/dL 9.5 9.7 9.6    Risks and benefits of anesthesia discussed at length, patient or surrogate demonstrates understanding. Appropriately NPO. Plan to proceed with anesthesia.  Champ Mungo, MD 07/11/21

## 2021-07-17 ENCOUNTER — Encounter: Payer: Self-pay | Admitting: Internal Medicine

## 2021-07-18 ENCOUNTER — Encounter: Admission: RE | Payer: Self-pay | Source: Home / Self Care

## 2021-07-18 ENCOUNTER — Ambulatory Visit: Admission: RE | Admit: 2021-07-18 | Payer: Medicare Other | Source: Home / Self Care | Admitting: Internal Medicine

## 2021-07-18 SURGERY — COLONOSCOPY WITH PROPOFOL
Anesthesia: General

## 2021-09-18 ENCOUNTER — Encounter: Payer: Self-pay | Admitting: Internal Medicine

## 2021-09-19 ENCOUNTER — Ambulatory Visit: Payer: Medicare Other | Admitting: Registered Nurse

## 2021-09-19 ENCOUNTER — Encounter
Admission: RE | Disposition: A | Discharge status: Home / Self Care | Payer: Self-pay | Source: Home / Self Care | Attending: Internal Medicine

## 2021-09-19 ENCOUNTER — Ambulatory Visit
Admission: RE | Admit: 2021-09-19 | Discharge: 2021-09-19 | Disposition: A | Discharge status: Home / Self Care | Payer: Medicare Other | Attending: Internal Medicine | Admitting: Internal Medicine

## 2021-09-19 ENCOUNTER — Encounter: Payer: Self-pay | Admitting: Internal Medicine

## 2021-09-19 ENCOUNTER — Other Ambulatory Visit: Payer: Self-pay

## 2021-09-19 DIAGNOSIS — D123 Benign neoplasm of transverse colon: Secondary | ICD-10-CM | POA: Insufficient documentation

## 2021-09-19 DIAGNOSIS — Z8673 Personal history of transient ischemic attack (TIA), and cerebral infarction without residual deficits: Secondary | ICD-10-CM | POA: Insufficient documentation

## 2021-09-19 DIAGNOSIS — R7303 Prediabetes: Secondary | ICD-10-CM | POA: Diagnosis not present

## 2021-09-19 DIAGNOSIS — Z1211 Encounter for screening for malignant neoplasm of colon: Secondary | ICD-10-CM | POA: Insufficient documentation

## 2021-09-19 DIAGNOSIS — Z7902 Long term (current) use of antithrombotics/antiplatelets: Secondary | ICD-10-CM | POA: Insufficient documentation

## 2021-09-19 HISTORY — PX: COLONOSCOPY WITH PROPOFOL: SHX5780

## 2021-09-19 SURGERY — COLONOSCOPY WITH PROPOFOL
Anesthesia: General

## 2021-09-19 MED ORDER — PROPOFOL 500 MG/50ML IV EMUL
INTRAVENOUS | Status: AC
  Filled 2021-09-19: qty 50

## 2021-09-19 MED ORDER — PROPOFOL 500 MG/50ML IV EMUL
INTRAVENOUS | Status: DC | PRN
  Administered 2021-09-19: 150 ug/kg/min via INTRAVENOUS

## 2021-09-19 MED ORDER — SODIUM CHLORIDE 0.9 % IV SOLN: INTRAVENOUS | Status: DC

## 2021-09-19 MED ORDER — LIDOCAINE HCL (CARDIAC) PF 100 MG/5ML IV SOSY
PREFILLED_SYRINGE | INTRAVENOUS | Status: DC | PRN
  Administered 2021-09-19: 60 mg via INTRAVENOUS

## 2021-09-19 MED ORDER — PROPOFOL 10 MG/ML IV BOLUS
INTRAVENOUS | Status: DC | PRN
  Administered 2021-09-19: 70 mg via INTRAVENOUS
  Administered 2021-09-19: 30 mg via INTRAVENOUS

## 2021-09-19 MED ORDER — LIDOCAINE HCL (PF) 2 % IJ SOLN
INTRAMUSCULAR | Status: AC
  Filled 2021-09-19: qty 5

## 2021-09-19 NOTE — Anesthesia Postprocedure Evaluation (Signed)
Anesthesia Post Note  Patient: [name]  Procedure(s) Performed: COLONOSCOPY WITH PROPOFOL  Patient location during evaluation: PACU Anesthesia Type: General Level of consciousness: awake and awake and alert Pain management: pain level controlled Vital Signs Assessment: post-procedure vital signs reviewed and stable Respiratory status: spontaneous breathing Cardiovascular status: blood pressure returned to baseline Anesthetic complications: no   No notable events documented.   Last Vitals:  Vitals:   09/19/21 1333 09/19/21 1338  BP: 128/65 128/65  Pulse: 78 76  Resp: 20 10  Temp: 36.8 C   SpO2: 94% 95%    Last Pain:  Vitals:   09/19/21 1333  TempSrc: Temporal  PainSc: Asleep                 VAN STAVEREN,Darby Shadwick

## 2021-09-19 NOTE — Interval H&P Note (Signed)
History and Physical Interval Note:  09/19/2021 1:04 PM  [name]  has presented today for surgery, with the diagnosis of Colon Cancer Screeing.  The various methods of treatment have been discussed with the patient and family. After consideration of risks, benefits and other options for treatment, the patient has consented to  Procedure(s): COLONOSCOPY WITH PROPOFOL (N/A) as a surgical intervention.  The patient's history has been reviewed, patient examined, no change in status, stable for surgery.  I have reviewed the patient's chart and labs.  Questions were answered to the patient's satisfaction.     [place], [place]

## 2021-09-19 NOTE — H&P (Signed)
Outpatient short stay form Pre-procedure 09/19/2021 11:56 AM [name] K. Alice Reichert, M.D.  Primary Physician: Tracie Harrier, M.D.  Reason for visit:  Colon cancer screening  History of present illness:  Patient presents for colonoscopy for colon cancer screening. The patient denies complaints of abdominal pain, significant change in bowel habits, or rectal bleeding.  Patient takes Plavix for history of stroke. Held for today's procedure.   No current facility-administered medications for this encounter.  Current Outpatient Medications:    acetaminophen (TYLENOL) 325 MG tablet, Take 2 tablets (650 mg total) by mouth every 6 (six) hours as needed for mild pain (or Fever >/= 101)., Disp: , Rfl:    amLODipine (NORVASC) 5 MG tablet, Take 1 tablet (5 mg total) by mouth daily., Disp: 30 tablet, Rfl: 0   aspirin EC 81 MG EC tablet, Take 1 tablet (81 mg total) by mouth daily. Swallow whole., Disp: 30 tablet, Rfl: 0   atorvastatin (LIPITOR) 40 MG tablet, Take 40 mg by mouth daily., Disp: , Rfl:    baclofen (LIORESAL) 10 MG tablet, Take 10 mg by mouth 3 (three) times daily., Disp: , Rfl:    buPROPion (WELLBUTRIN XL) 150 MG 24 hr tablet, Take 150 mg by mouth daily., Disp: , Rfl:    Cholecalciferol 25 MCG (1000 UT) tablet, Take 1,000 Units by mouth daily., Disp: , Rfl:    citalopram (CELEXA) 20 MG tablet, Take 20 mg by mouth daily., Disp: , Rfl:    clopidogrel (PLAVIX) 75 MG tablet, Take 75 mg by mouth daily., Disp: , Rfl:    escitalopram (LEXAPRO) 10 MG tablet, Take 10 mg by mouth daily., Disp: , Rfl:    feeding supplement (ENSURE ENLIVE / ENSURE PLUS) LIQD, Take 237 mLs by mouth 2 (two) times daily between meals., Disp: 81829 mL, Rfl: 0   folic acid (FOLVITE) 1 MG tablet, Take 1 tablet (1 mg total) by mouth daily., Disp: 30 tablet, Rfl: 0   furosemide (LASIX) 20 MG tablet, Take 20 mg by mouth daily., Disp: , Rfl:    hydrochlorothiazide (HYDRODIURIL) 12.5 MG tablet, Take 12.5 mg by mouth daily., Disp:  , Rfl:    ibuprofen (ADVIL) 800 MG tablet, Take 1 tablet (800 mg total) by mouth every 8 (eight) hours as needed., Disp: 30 tablet, Rfl: 0   LORazepam (ATIVAN) 0.5 MG tablet, Take 0.5 tablets (0.25 mg total) by mouth daily., Disp: 30 tablet, Rfl: 0   meclizine (ANTIVERT) 25 MG tablet, Take 25 mg by mouth 3 (three) times daily as needed for dizziness., Disp: , Rfl:    melatonin 5 MG TABS, Take 0.5 tablets (2.5 mg total) by mouth at bedtime. (Patient taking differently: Take 5 mg by mouth at bedtime.), Disp: 30 tablet, Rfl: 0   methylPREDNISolone (MEDROL DOSEPAK) 4 MG TBPK tablet, Medrol dosepak.  Dispense #1.  No refills.  Take per package instructions (Patient taking differently: Medrol dosepak.  Dispense #1.  No refills.  Take per package instructions), Disp: 1 each, Rfl: 0   nicotine (NICODERM CQ - DOSED IN MG/24 HOURS) 21 mg/24hr patch, Place 1 patch (21 mg total) onto the skin daily., Disp: 28 patch, Rfl: 0   QUEtiapine (SEROQUEL) 200 MG tablet, Take 1 tablet (200 mg total) by mouth at bedtime. (Patient taking differently: Take 300 mg by mouth at bedtime.), Disp: 30 tablet, Rfl: 0   risperiDONE (RISPERDAL) 0.5 MG tablet, Take 0.5 mg by mouth., Disp: , Rfl:    traZODone (DESYREL) 150 MG tablet, Take 1 tablet by mouth  at bedtime., Disp: , Rfl:   No medications prior to admission.     No Known Allergies   Past Medical History:  Diagnosis Date   Anxiety    Arthritis    DDD, lumbar radiculopathy   Bell's palsy    hx of, right side   Bell's palsy    Chronic back pain    followed by pain clinic at [facility]   Depression    hx of depression   GERD (gastroesophageal reflux disease)    Hepatitis    hx of hepatitis C treated with Harvoni per patient   History of kidney stones    Hypertension    Kidney stone    hx of   Pre-diabetes    Stroke ([place]) 02/2018   loss of peripheral vision and balance unsteady   Substance abuse ([place])    heroin   Wears dentures    Has full upper.  Does not  wear.    Review of systems:  Otherwise negative.    Physical Exam  Gen: Alert, oriented. Appears stated age.  HEENT: [place]/AT. PERRLA. Lungs: CTA, no wheezes. CV: RR nl S1, S2. Abd: soft, benign, no masses. BS+ Ext: No edema. Pulses 2+    Planned procedures: Proceed with colonoscopy. The patient understands the nature of the planned procedure, indications, risks, alternatives and potential complications including but not limited to bleeding, infection, perforation, damage to internal organs and possible oversedation/side effects from anesthesia. The patient agrees and gives consent to proceed.  Please refer to procedure notes for findings, recommendations and patient disposition/instructions.     Radek Carnero K. Alice Reichert, M.D. Gastroenterology 09/19/2021  11:56 AM

## 2021-09-19 NOTE — Op Note (Signed)
[facility] Gastroenterology Patient Name: [name] See Procedure Date: 09/19/2021 12:20 PM MRN: [id number] Account #: [id number] Date of Birth: 1946-10-01 Admit Type: Outpatient Age: 75 Room: [facility] ENDO ROOM 2 Gender: Male Note Status: Finalized Instrument Name: Jasper Riling [id number] Procedure:             Colonoscopy Indications:           Screening for colorectal malignant neoplasm Providers:             Lorie Apley K. Alice Reichert MD, MD Referring MD:          Tracie Harrier, MD (Referring MD) Medicines:             Propofol per Anesthesia Complications:         No immediate complications. Procedure:             Pre-Anesthesia Assessment:                        - The risks and benefits of the procedure and the                         sedation options and risks were discussed with the                         patient. All questions were answered and informed                         consent was obtained.                        - Patient identification and proposed procedure were                         verified prior to the procedure by the nurse. The                         procedure was verified in the procedure room.                        - ASA Grade Assessment: III - A patient with severe                         systemic disease.                        - After reviewing the risks and benefits, the patient                         was deemed in satisfactory condition to undergo the                         procedure.                        After obtaining informed consent, the colonoscope was                         passed under direct vision. Throughout the procedure,                         the  patient's blood pressure, pulse, and oxygen                         saturations were monitored continuously. The                         Colonoscope was introduced through the anus with the                         intention of advancing to the cecum. The scope was                          advanced to the ascending colon before the procedure                         was aborted. Medications were given. The colonoscopy                         was technically difficult and complex due to                         inadequate bowel prep. Findings:      The perianal and digital rectal examinations were normal. Pertinent       negatives include normal sphincter tone and no palpable rectal lesions.      Two semi-pedunculated polyps were found in the transverse colon. The       polyps were 7 to 9 mm in size. These polyps were removed with a hot       snare. Resection and retrieval were complete.      Copious quantities of stool was found in the ascending colon,       interfering with visualization. Lavage of the area was performed using a       moderate amount of sterile water, resulting in incomplete clearance with       fair visualization.      The exam was otherwise without abnormality on direct and retroflexion       views. Impression:            - Two 7 to 9 mm polyps in the transverse colon,                         removed with a hot snare. Resected and retrieved.                        - Stool in the ascending colon.                        - The examination was otherwise normal on direct and                         retroflexion views. Recommendation:        - Patient has a contact number available for                         emergencies. The signs and symptoms of potential                         delayed complications were discussed with the patient.  Return to normal activities tomorrow. Written                         discharge instructions were provided to the patient.                        - Resume previous diet.                        - Continue present medications.                        - Await pathology results.                        - Repeat colonoscopy in 1 year for surveillance.                        - I advise 'pre-prep' of  colon for two weeks prior to                         next scheduled colonoscopy. Take Miralax 17g (one                         capful) daily mixed in liquid of choice daily for two                         weeks leading up to the procedure IN ADDITION to the                         Large volume prep the day prior to the procedure.                        - STRICT CLEAR LIQUID DIET 24 or more hours prior to                         next colonoscopy. Trilyte prep for next colonoscopy.                        - Return to GI office PRN.                        - The findings and recommendations were discussed with                         the patient. Procedure Code(s):     --- Professional ---                        [phone], 52, Colonoscopy, flexible; with removal of                         tumor(s), polyp(s), or other lesion(s) by snare                         technique Diagnosis Code(s):     --- Professional ---                        K63.5, Polyp of colon  Z12.11, Encounter for screening for malignant neoplasm                         of colon CPT copyright 2019 American [employer]. All rights reserved. The codes documented in this report are preliminary and upon coder review may  be revised to meet current compliance requirements. Efrain Sella MD, MD 09/19/2021 1:35:26 PM This report has been signed electronically. Number of Addenda: 0 Note Initiated On: 09/19/2021 12:20 PM Scope Withdrawal Time: 0 hours 5 minutes 57 seconds  Total Procedure Duration: 0 hours 17 minutes 18 seconds  Estimated Blood Loss:  Estimated blood loss: none.      [facility]

## 2021-09-19 NOTE — Anesthesia Preprocedure Evaluation (Signed)
Anesthesia Evaluation  Patient identified by MRN, date of birth, ID band Patient awake    Reviewed: Allergy & Precautions, NPO status , Patient's Chart, lab work & pertinent test results  Airway Mallampati: II  TM Distance: >3 FB Neck ROM: full    Dental  (+) Edentulous Upper, Edentulous Lower   Pulmonary neg pulmonary ROS, COPD, Current Smoker,    Pulmonary exam normal  + decreased breath sounds      Cardiovascular Exercise Tolerance: Poor hypertension, Pt. on medications + Peripheral Vascular Disease  negative cardio ROS Normal cardiovascular exam Rhythm:Regular     Neuro/Psych Anxiety Depression Bipolar Disorder CVA negative neurological ROS  negative psych ROS   GI/Hepatic negative GI ROS, Neg liver ROS, GERD  ,  Endo/Other  negative endocrine ROS  Renal/GU negative Renal ROS  negative genitourinary   Musculoskeletal   Abdominal Normal abdominal exam  (+)   Peds negative pediatric ROS (+)  Hematology negative hematology ROS (+)   Anesthesia Other Findings Past Medical History: No date: Anxiety No date: Arthritis     Comment:  DDD, lumbar radiculopathy No date: Bell's palsy     Comment:  hx of, right side No date: Bell's palsy No date: Chronic back pain     Comment:  followed by pain clinic at [facility] No date: Depression     Comment:  hx of depression No date: GERD (gastroesophageal reflux disease) No date: Hepatitis     Comment:  hx of hepatitis C treated with Harvoni per patient No date: History of kidney stones No date: Hypertension No date: Kidney stone     Comment:  hx of No date: Pre-diabetes 02/2018: Stroke ([place])     Comment:  loss of peripheral vision and balance unsteady No date: Substance abuse ([place])     Comment:  heroin No date: Wears dentures     Comment:  Has full upper.  Does not wear.  Past Surgical History: 05/13/2012: ANTERIOR LUMBAR FUSION     Comment:  Procedure: ANTERIOR LUMBAR  FUSION 1 LEVEL;  Surgeon:               Melina Schools, MD;  Location: [place];  Service:               Orthopedics;  Laterality: N/A;  ALIF L5-S1 01/01/2021: CATARACT EXTRACTION W/PHACO; Left     Comment:  Procedure: CATARACT EXTRACTION PHACO AND INTRAOCULAR               LENS PLACEMENT (IOC) LEFT DIABETIC 2.28 00:24.7;                Surgeon: Eulogio Bear, MD;  Location: [place];  Service: Ophthalmology;  Laterality: Left;              covid + 11-30-20 01/15/2021: CATARACT EXTRACTION W/PHACO; Right     Comment:  Procedure: CATARACT EXTRACTION PHACO AND INTRAOCULAR               LENS PLACEMENT (IOC) RIGHT DIABETIC;  Surgeon: Eulogio Bear, MD;  Location: [place];                Service: Ophthalmology;  Laterality: Right;  2.62 0:34.5 No date: CHOLECYSTECTOMY 08/04/2018: CYSTOSCOPY W/ RETROGRADES; Left     Comment:  Procedure: CYSTOSCOPY WITH RETROGRADE PYELOGRAM;  Surgeon: Abbie Sons, MD;  Location: [facility];                Service: Urology;  Laterality: Left; 01/18/2020: CYSTOSCOPY WITH INSERTION OF UROLIFT; N/A     Comment:  Procedure: CYSTOSCOPY WITH INSERTION OF UROLIFT;                Surgeon: Abbie Sons, MD;  Location: [facility];                Service: Urology;  Laterality: N/A; 08/04/2018: CYSTOSCOPY WITH STENT PLACEMENT; Left     Comment:  Procedure: CYSTOSCOPY WITH STENT PLACEMENT;  Surgeon:               Abbie Sons, MD;  Location: [facility];  Service:               Urology;  Laterality: Left; 2012: EXTRACORPOREAL SHOCK WAVE LITHOTRIPSY 08/20/2018: EXTRACORPOREAL SHOCK WAVE LITHOTRIPSY; Left     Comment:  Procedure: EXTRACORPOREAL SHOCK WAVE LITHOTRIPSY (ESWL);              Surgeon: Hollice Espy, MD;  Location: [facility];                Service: Urology;  Laterality: Left; (cancelled) 08/27/2018: EXTRACORPOREAL SHOCK WAVE LITHOTRIPSY; Left     Comment:  Procedure: EXTRACORPOREAL SHOCK  WAVE LITHOTRIPSY (ESWL);              Surgeon: Abbie Sons, MD;  Location: [facility];                Service: Urology;  Laterality: Left; 10/15/2018: EXTRACORPOREAL SHOCK WAVE LITHOTRIPSY; Right     Comment:  Procedure: EXTRACORPOREAL SHOCK WAVE LITHOTRIPSY (ESWL);              Surgeon: Billey Co, MD;  Location: [facility];                Service: Urology;  Laterality: Right; No date: KIDNEY STONE SURGERY 05/13/2012: LUMBAR FUSION  BMI    Body Mass Index: 26.89 kg/m      Reproductive/Obstetrics negative OB ROS                             Anesthesia Physical Anesthesia Plan  ASA: 3  Anesthesia Plan: General   Post-op Pain Management:    Induction: Intravenous  PONV Risk Score and Plan: Propofol infusion and TIVA  Airway Management Planned: Natural Airway and Nasal Cannula  Additional Equipment:   Intra-op Plan:   Post-operative Plan:   Informed Consent: I have reviewed the patients History and Physical, chart, labs and discussed the procedure including the risks, benefits and alternatives for the proposed anesthesia with the patient or authorized representative who has indicated his/her understanding and acceptance.     Dental Advisory Given  Plan Discussed with: CRNA and Surgeon  Anesthesia Plan Comments:         Anesthesia Quick Evaluation

## 2021-09-19 NOTE — Transfer of Care (Signed)
Immediate Anesthesia Transfer of Care Note  Patient: [name]  Procedure(s) Performed: COLONOSCOPY WITH PROPOFOL  Patient Location: Endoscopy Unit  Anesthesia Type:General  Level of Consciousness: awake, alert  and oriented  Airway & Oxygen Therapy: Patient Spontanous Breathing  Post-op Assessment: Report given to RN and Post -op Vital signs reviewed and stable  Post vital signs: Reviewed and stable  Last Vitals:  Vitals Value Taken Time  BP 128/65 09/19/21 1338  Temp    Pulse 76 09/19/21 1338  Resp 10 09/19/21 1338  SpO2 95 % 09/19/21 1338    Last Pain:  Vitals:   09/19/21 1227  TempSrc: Temporal  PainSc: 0-No pain         Complications: No notable events documented.

## 2021-09-20 ENCOUNTER — Encounter: Payer: Self-pay | Admitting: Internal Medicine

## 2021-09-20 LAB — SURGICAL PATHOLOGY

## 2021-09-24 DIAGNOSIS — Z23 Encounter for immunization: Secondary | ICD-10-CM | POA: Diagnosis not present

## 2021-09-28 ENCOUNTER — Ambulatory Visit: Payer: Medicare Other

## 2021-10-21 DIAGNOSIS — L97929 Non-pressure chronic ulcer of unspecified part of left lower leg with unspecified severity: Secondary | ICD-10-CM

## 2021-10-21 HISTORY — DX: Non-pressure chronic ulcer of unspecified part of left lower leg with unspecified severity: L97.929

## 2021-12-01 ENCOUNTER — Other Ambulatory Visit: Payer: Self-pay

## 2021-12-01 ENCOUNTER — Emergency Department
Admission: EM | Admit: 2021-12-01 | Discharge: 2021-12-01 | Disposition: A | Discharge status: Home / Self Care | Payer: Medicare PPO | Attending: Emergency Medicine | Admitting: Emergency Medicine

## 2021-12-01 ENCOUNTER — Encounter: Payer: Self-pay | Admitting: Emergency Medicine

## 2021-12-01 DIAGNOSIS — L03116 Cellulitis of left lower limb: Secondary | ICD-10-CM | POA: Diagnosis not present

## 2021-12-01 DIAGNOSIS — W010XXA Fall on same level from slipping, tripping and stumbling without subsequent striking against object, initial encounter: Secondary | ICD-10-CM | POA: Insufficient documentation

## 2021-12-01 DIAGNOSIS — S81802A Unspecified open wound, left lower leg, initial encounter: Secondary | ICD-10-CM | POA: Insufficient documentation

## 2021-12-01 DIAGNOSIS — I10 Essential (primary) hypertension: Secondary | ICD-10-CM | POA: Diagnosis not present

## 2021-12-01 LAB — CBC WITH DIFFERENTIAL/PLATELET
Abs Immature Granulocytes: 0.03 10*3/uL (ref 0.00–0.07)
Basophils Absolute: 0.1 10*3/uL (ref 0.0–0.1)
Basophils Relative: 1 %
Eosinophils Absolute: 0.2 10*3/uL (ref 0.0–0.5)
Eosinophils Relative: 2 %
HCT: 43.7 % (ref 39.0–52.0)
Hemoglobin: 14.6 g/dL (ref 13.0–17.0)
Immature Granulocytes: 0 %
Lymphocytes Relative: 15 %
Lymphs Abs: 1.4 10*3/uL (ref 0.7–4.0)
MCH: 31.4 pg (ref 26.0–34.0)
MCHC: 33.4 g/dL (ref 30.0–36.0)
MCV: 94 fL (ref 80.0–100.0)
Monocytes Absolute: 0.7 10*3/uL (ref 0.1–1.0)
Monocytes Relative: 7 %
Neutro Abs: 7.2 10*3/uL (ref 1.7–7.7)
Neutrophils Relative %: 75 %
Platelets: 224 10*3/uL (ref 150–400)
RBC: 4.65 MIL/uL (ref 4.22–5.81)
RDW: 13.8 % (ref 11.5–15.5)
WBC: 9.6 10*3/uL (ref 4.0–10.5)
nRBC: 0 % (ref 0.0–0.2)

## 2021-12-01 LAB — COMPREHENSIVE METABOLIC PANEL
ALT: 18 U/L (ref 0–44)
AST: 22 U/L (ref 15–41)
Albumin: 4.4 g/dL (ref 3.5–5.0)
Alkaline Phosphatase: 81 U/L (ref 38–126)
Anion gap: 12 (ref 5–15)
BUN: 20 mg/dL (ref 8–23)
CO2: 26 mmol/L (ref 22–32)
Calcium: 9.3 mg/dL (ref 8.9–10.3)
Chloride: 98 mmol/L (ref 98–111)
Creatinine, Ser: 0.92 mg/dL (ref 0.61–1.24)
GFR, Estimated: >60 mL/min (ref >60)
Glucose, Bld: 109 mg/dL — ABNORMAL HIGH (ref 70–99)
Potassium: 3.7 mmol/L (ref 3.5–5.1)
Sodium: 136 mmol/L (ref 135–145)
Total Bilirubin: 0.5 mg/dL (ref 0.3–1.2)
Total Protein: 8.3 g/dL — ABNORMAL HIGH (ref 6.5–8.1)

## 2021-12-01 LAB — LACTIC ACID, PLASMA
Lactic Acid, Venous: 2.6 mmol/L (ref 0.5–1.9)
Lactic Acid, Venous: 1.8 mmol/L (ref 0.5–1.9)

## 2021-12-01 MED ORDER — SODIUM CHLORIDE 0.9 % IV BOLUS
1000.0000 mL | Freq: Once | INTRAVENOUS | Status: AC
  Administered 2021-12-01: 1000 mL via INTRAVENOUS

## 2021-12-01 MED ORDER — SODIUM CHLORIDE 0.9 % IV SOLN
1.0000 g | Freq: Once | INTRAVENOUS | Status: AC
  Administered 2021-12-01: 1 g via INTRAVENOUS
  Filled 2021-12-01: qty 10

## 2021-12-01 MED ORDER — OXYCODONE-ACETAMINOPHEN 5-325 MG PO TABS
1.0000 | ORAL_TABLET | Freq: Once | ORAL | Status: AC
  Administered 2021-12-01: 1 via ORAL
  Filled 2021-12-01: qty 1

## 2021-12-01 MED ORDER — CEPHALEXIN 500 MG PO CAPS: 500.0000 mg | ORAL_CAPSULE | Freq: Four times a day (QID) | ORAL | 0 refills | Status: AC

## 2021-12-01 NOTE — ED Notes (Signed)
ED Provider at bedside. 

## 2021-12-01 NOTE — ED Provider Notes (Signed)
[facility] Provider Note    Event Date/Time   First MD Initiated Contact with Patient 12/01/21 1118     (approximate)   History   Chief Complaint Wound Infection   HPI  HYMAN CROSSAN is a 76 y.o. male with past medical history of hypertension, hyperlipidemia, stroke, peripheral vascular disease, and chronic pain syndrome who presents to the ED complaining of wound infection.  Patient reports that about 3 weeks ago he tripped and fell, causing a branch to scraped his left shin and caused a wound.  He began to notice increased redness, swelling, and pain from this area, was subsequently seen by his PCP.  He was prescribed doxycycline for cellulitis and has been taking this for the past week.  He states that the redness is about the same since he started the antibiotic and the wound is beginning to scab over.  He has not noticed significant swelling or drainage and denies any fevers.  He is primarily concerned because he is continuing to experience pain in the area.  He reports being compliant with antibiotic regimen and has not missed any doses.     Physical Exam   Triage Vital Signs: ED Triage Vitals  Enc Vitals Group     BP 12/01/21 1009 130/70     Pulse Rate 12/01/21 1009 98     Resp 12/01/21 1009 18     Temp 12/01/21 1009 99.4 F (37.4 C)     Temp Source 12/01/21 1009 Oral     SpO2 12/01/21 1009 98 %     Weight 12/01/21 1007 147 lb (66.7 kg)     Height 12/01/21 1007 5\' 3"  (1.6 m)     Head Circumference --      Peak Flow --      Pain Score 12/01/21 1007 9     Pain Loc --      Pain Edu? --      Excl. in [place]? --     Most recent vital signs: Vitals:   12/01/21 1009 12/01/21 1321  BP: 130/70 (!) 143/54  Pulse: 98 86  Resp: 18 16  Temp: 99.4 F (37.4 C)   SpO2: 98% 98%    Constitutional: Alert and oriented. Eyes: Conjunctivae are normal. Head: Atraumatic. Nose: No congestion/rhinnorhea. Mouth/Throat: Mucous membranes are moist.   Cardiovascular: Normal rate, regular rhythm. Grossly normal heart sounds.  2+ DP pulses bilaterally. Respiratory: Normal respiratory effort.  No retractions. Lungs CTAB. Gastrointestinal: Soft and nontender. No distention. Musculoskeletal: Tenderness to palpation noted to left anterior shin with central area of scabbing surrounded by mild erythema and warmth, no edema or fluctuance noted, no purulent drainage noted. Neurologic:  Normal speech and language. No gross focal neurologic deficits are appreciated.       ED Results / Procedures / Treatments   Labs (all labs ordered are listed, but only abnormal results are displayed) Labs Reviewed  LACTIC ACID, PLASMA - Abnormal; Notable for the following components:      Result Value   Lactic Acid, Venous 2.6 (*)    All other components within normal limits  COMPREHENSIVE METABOLIC PANEL - Abnormal; Notable for the following components:   Glucose, Bld 109 (*)    Total Protein 8.3 (*)    All other components within normal limits  LACTIC ACID, PLASMA  CBC WITH DIFFERENTIAL/PLATELET     PROCEDURES:  Critical Care performed: No  Procedures   MEDICATIONS ORDERED IN ED: Medications  sodium chloride 0.9 % bolus  1,000 mL (0 mLs Intravenous Stopped 12/01/21 1311)  cefTRIAXone (ROCEPHIN) 1 g in sodium chloride 0.9 % 100 mL IVPB (0 g Intravenous Stopped 12/01/21 1312)  oxyCODONE-acetaminophen (PERCOCET/ROXICET) 5-325 MG per tablet 1 tablet (1 tablet Oral Given 12/01/21 1159)     IMPRESSION / MDM / ASSESSMENT AND PLAN / ED COURSE  I reviewed the triage vital signs and the nursing notes.                              76 y.o. male with past medical history of hypertension, hyperlipidemia, stroke, peripheral vascular disease, and chronic pain syndrome who presents to the ED with redness and pain to left anterior shin after suffering a wound from a scraped by a tree branch about 3 weeks ago.  Differential diagnosis includes, but is not  limited to, sepsis, cellulitis, abscess, DVT, arterial insufficiency.  Patient is nontoxic-appearing and in no acute distress, vital signs are reassuring and not consistent with sepsis.  Exam is consistent with a mild cellulitis to his left anterior shin, has been compliant with doxycycline prescribed by his PCP.  There are no signs of purulence on exam and I have low suspicion for MRSA, he would likely benefit from added strep coverage as this is often not fully covered by doxycycline.  We will give dose of IV Rocephin here in the ED, labs remarkable only for mildly elevated lactic acid.  We will hydrate with IV fluids and repeat lactic acid, if this is downtrending then patient be appropriate for outpatient management with addition of cephalosporin.  CBC shows no leukocytosis and CMP shows no AKI or electrolyte abnormality.  He has a strong DP pulse and there is no circumferential swelling to suggest DVT.  Patient feeling better following dose of pain medicine and IV antibiotics.  Repeat lactic acid is now within normal limits following IV fluid hydration.  Given mild appearance of cellulitis, he is appropriate for additional outpatient management to which we will add Keflex for strep coverage.  He was counseled to follow-up with his PCP and to return to the ED for new or worsening symptoms, patient agrees with plan.      FINAL CLINICAL IMPRESSION(S) / ED DIAGNOSES   Final diagnoses:  Left leg cellulitis     Rx / DC Orders   ED Discharge Orders          Ordered    cephALEXin (KEFLEX) 500 MG capsule  4 times daily        12/01/21 1350             Note:  This document was prepared using Dragon voice recognition software and may include unintentional dictation errors.   Blake Divine, MD 12/01/21 1351

## 2021-12-01 NOTE — ED Triage Notes (Signed)
Pt via POV from home. Pt states he fell 3 weeks ago and cut his L shin on a branch. Pt was seen at his PCP and prescribed antibiotics 1 weeks ago and it has not help. L leg noted to be red and swollen around the area. Laceration is now scabbed over. Pt is A&Ox4 and NAD

## 2021-12-01 NOTE — ED Notes (Signed)
Pt. Ambulated to restroom with stand by assist, pt. States he is steady and at his baseline. Gait steady.

## 2022-01-16 ENCOUNTER — Emergency Department
Admission: EM | Admit: 2022-01-16 | Discharge: 2022-01-16 | Disposition: A | Discharge status: Home / Self Care | Payer: Medicare PPO | Attending: Emergency Medicine | Admitting: Emergency Medicine

## 2022-01-16 ENCOUNTER — Emergency Department: Payer: Medicare PPO

## 2022-01-16 ENCOUNTER — Encounter: Payer: Self-pay | Admitting: Intensive Care

## 2022-01-16 ENCOUNTER — Other Ambulatory Visit: Payer: Self-pay

## 2022-01-16 DIAGNOSIS — L089 Local infection of the skin and subcutaneous tissue, unspecified: Secondary | ICD-10-CM

## 2022-01-16 DIAGNOSIS — M549 Dorsalgia, unspecified: Secondary | ICD-10-CM | POA: Diagnosis not present

## 2022-01-16 DIAGNOSIS — L0889 Other specified local infections of the skin and subcutaneous tissue: Secondary | ICD-10-CM | POA: Diagnosis present

## 2022-01-16 MED ORDER — DOXYCYCLINE HYCLATE 100 MG PO TABS
100.0000 mg | ORAL_TABLET | Freq: Once | ORAL | Status: AC
  Administered 2022-01-16: 100 mg via ORAL
  Filled 2022-01-16: qty 1

## 2022-01-16 MED ORDER — CEPHALEXIN 500 MG PO CAPS: 500.0000 mg | ORAL_CAPSULE | Freq: Four times a day (QID) | ORAL | 0 refills | Status: AC

## 2022-01-16 MED ORDER — CEPHALEXIN 500 MG PO CAPS
500.0000 mg | ORAL_CAPSULE | Freq: Once | ORAL | Status: AC
  Administered 2022-01-16: 500 mg via ORAL
  Filled 2022-01-16: qty 1

## 2022-01-16 MED ORDER — DOXYCYCLINE HYCLATE 100 MG PO TABS: 100.0000 mg | ORAL_TABLET | Freq: Two times a day (BID) | ORAL | 0 refills | Status: DC

## 2022-01-16 NOTE — ED Triage Notes (Signed)
Patient c/o lower back chronic pain and reports wound on left shin has worsened. Reports drainage from wound ?

## 2022-01-16 NOTE — ED Provider Notes (Signed)
? ?[facility] ?Provider Note ? ?Patient Contact: 9:38 PM (approximate) ? ? ?History  ? ?Back Pain and Wound Check ? ? ?HPI ? ?[name] is a 75 y.o. male who presents the emergency department for ongoing possible infection to the left shin.  Patient reports that roughly 2 months ago he was outside with his wife when he tripped and fell and scraped his leg on an unknown object.  Patient had what appeared to be a developing infection and presented to the emergency department at the beginning of February.  Patient has an ED encounter which I was able to review from 12/01/2021.  The patient had some mild cellulitis and had a picture placed into his chart regarding the patient's leg wound.  Patient states that the pain had improved with the antibiotic, but the scabbing had not improved since his previous evaluation.  Patient reports that he has had some drainage from this scabbing, increased pain to the left shin.  No systemic complaints of fevers or chills.  No new injuries to the site.  Patient denies any other complaints at this time. ?  ? ? ?Physical Exam  ? ?Triage Vital Signs: ?ED Triage Vitals  ?Enc Vitals Group  ?   BP 01/16/22 1825 (!) 142/75  ?   Pulse Rate 01/16/22 1825 86  ?   Resp 01/16/22 1825 16  ?   Temp 01/16/22 1825 98.4 ?F (36.9 ?C)  ?   Temp Source 01/16/22 1825 Oral  ?   SpO2 01/16/22 1825 98 %  ?   Weight 01/16/22 1827 147 lb (66.7 kg)  ?   Height 01/16/22 1827 '5\' 3"'$  (1.6 m)  ?   Head Circumference --   ?   Peak Flow --   ?   Pain Score 01/16/22 1827 9  ?   Pain Loc --   ?   Pain Edu? --   ?   Excl. in [place]? --   ? ? ?Most recent vital signs: ?Vitals:  ? 01/16/22 1825 01/16/22 2300  ?BP: (!) 142/75 140/78  ?Pulse: 86 84  ?Resp: 16 16  ?Temp: 98.4 ?F (36.9 ?C) 98.5 ?F (36.9 ?C)  ?SpO2: 98% 100%  ? ? ? ?General: Alert and in no acute distress.  ?Cardiovascular:  Good peripheral perfusion ?Respiratory: Normal respiratory effort without tachypnea or retractions. Lungs CTAB.   ?Musculoskeletal: Full range of motion to all extremities.  Patient has a scabbed lesion noted to the left shin.  This is just inferior to the tibial tuberosity.  Patient has some dried drainage noted inferiorly to the scabbed lesion though palpation did not reveal any fluctuance and did not express any purulent drainage.  Overall this does not seem to be improved much from his previous visit and previous picture of same.  Area is tender to palpation.  No palpable abnormality other than scabbing.  Examination of the left knee and ankle is unremarkable with full range of motion and no tenderness.  Dorsalis pedis pulses sensation intact distally. ?Neurologic:  No gross focal neurologic deficits are appreciated.  ?Skin:   No rash noted ?Other: ? ? ? ? ?ED Results / Procedures / Treatments  ? ?Labs ?(all labs ordered are listed, but only abnormal results are displayed) ?Labs Reviewed - No data to display ? ? ?EKG ? ? ? ? ?RADIOLOGY ? ?I personally viewed and evaluated these images as part of my medical decision making, as well as reviewing the written report by the radiologist. ? ?  ED Provider Interpretation: No evidence of osteomyelitis or soft tissue gas ? ?DG Tibia/Fibula Left ? ?Result Date: 01/16/2022 ?CLINICAL DATA:  wound, drainage EXAM: LEFT TIBIA AND FIBULA - 2 VIEW COMPARISON:  None. FINDINGS: No cortical erosion or destruction. There is no evidence of fracture or other focal bone lesions. Anterior proximal leg dermal/soft tissue defect. IMPRESSION: 1. No radiographic findings to suggest osteomyelitis. 2.  No acute displaced fracture or dislocation. Electronically Signed   By: Iven Finn M.D.   On: 01/16/2022 18:58   ? ?PROCEDURES: ? ?Critical Care performed: No ? ?Procedures ? ? ?MEDICATIONS ORDERED IN ED: ?Medications  ?doxycycline (VIBRA-TABS) tablet 100 mg (100 mg Oral Given 01/16/22 2240)  ?cephALEXin (KEFLEX) capsule 500 mg (500 mg Oral Given 01/16/22 2240)  ? ? ? ?IMPRESSION / MDM / ASSESSMENT AND  PLAN / ED COURSE  ?I reviewed the triage vital signs and the nursing notes. ?             ?               ? ?Differential diagnosis includes, but is not limited to, cellulitis, abscess, osteomyelitis ? ? ?Patient's diagnosis is consistent with infected wound.  Patient presents the emergency department with ongoing symptoms regarding a lesion to the left shin.  Patient had a wound sustained roughly 2 months ago.  He was seen in this department on 12/01/2021 with concerns for infection.  Been placed on Keflex and seemed to improve.  Patient has not had much improvement in the scabbing however.  It appears that patient does have some mildly infectious changes regarding this lesion.  No fluctuance or induration concerning for superficial abscess.  X-ray revealed no evidence of osteomyelitis.  No concern for septic arthritis.  Given the fact the patient has had no fevers, no changes in his vital signs and this appears to be localized no indication for labs at this time.  I will place the patient on dual antibiotic therapy of Keflex and doxycycline.  I have recommended follow-up with wound care as patient has had this wound for approximately 2 months at this time..  Concerning signs and symptoms are discussed with the patient and his wife who is present at this time.  Return precautions discussed.  Patient is given ED precautions to return to the ED for any worsening or new symptoms. ? ? ? ?  ? ? ?FINAL CLINICAL IMPRESSION(S) / ED DIAGNOSES  ? ?Final diagnoses:  ?Infected wound  ? ? ? ?Rx / DC Orders  ? ?ED Discharge Orders   ? ?      Ordered  ?  cephALEXin (KEFLEX) 500 MG capsule  4 times daily       ? 01/16/22 2238  ?  doxycycline (VIBRA-TABS) 100 MG tablet  2 times daily       ? 01/16/22 2238  ? ?  ?  ? ?  ? ? ? ?Note:  This document was prepared using Dragon voice recognition software and may include unintentional dictation errors. ?  ?Darletta Moll, PA-C ?01/17/22 0007 ? ?  ?Nena Polio, MD ?01/17/22  0012 ? ?

## 2022-02-01 ENCOUNTER — Encounter: Payer: Medicare PPO | Attending: Physician Assistant | Admitting: Physician Assistant

## 2022-02-01 DIAGNOSIS — Z8673 Personal history of transient ischemic attack (TIA), and cerebral infarction without residual deficits: Secondary | ICD-10-CM | POA: Diagnosis not present

## 2022-02-01 DIAGNOSIS — L97822 Non-pressure chronic ulcer of other part of left lower leg with fat layer exposed: Secondary | ICD-10-CM | POA: Insufficient documentation

## 2022-02-01 DIAGNOSIS — F3189 Other bipolar disorder: Secondary | ICD-10-CM | POA: Insufficient documentation

## 2022-02-01 DIAGNOSIS — B182 Chronic viral hepatitis C: Secondary | ICD-10-CM | POA: Insufficient documentation

## 2022-02-01 DIAGNOSIS — F17200 Nicotine dependence, unspecified, uncomplicated: Secondary | ICD-10-CM | POA: Insufficient documentation

## 2022-02-01 DIAGNOSIS — I872 Venous insufficiency (chronic) (peripheral): Secondary | ICD-10-CM | POA: Insufficient documentation

## 2022-02-01 DIAGNOSIS — I7389 Other specified peripheral vascular diseases: Secondary | ICD-10-CM | POA: Insufficient documentation

## 2022-02-01 DIAGNOSIS — I10 Essential (primary) hypertension: Secondary | ICD-10-CM | POA: Diagnosis not present

## 2022-02-05 NOTE — Progress Notes (Signed)
[name] ([id number]) ?Visit Report for 02/01/2022 ?Abuse Risk Screen Details ?Patient Name: [name], [name] ?Date of Service: 02/01/2022 8:45 AM ?Medical Record Number: [id number] ?Patient Account Number: [id number] ?Date of Birth/Sex: [birth year] (76 y.o. M) ?Treating RN: [name] ?Primary Care [name]: [name] Other Clinician: ?Referring [name]: [name] ?Treating [name]/Extender: [name] ?Weeks in Treatment: 0 ?Abuse Risk Screen Items ?Answer ?ABUSE RISK SCREEN: ?Has anyone close to you tried to hurt or harm you recentlyo No ?Do you feel uncomfortable with anyone in your familyo No ?Has anyone forced you do things that you didnot want to doo No ?Electronic Signature(s) ?Signed: 02/05/2022 9:16:22 AM By: Cameron Coria RN ?Entered By: [name] on 02/01/2022 08:53:04 ?[name] ([id number]) ?-------------------------------------------------------------------------------- ?Activities of Daily Living Details ?Patient Name: [name], [name] ?Date of Service: 02/01/2022 8:45 AM ?Medical Record Number: [id number] ?Patient Account Number: [id number] ?Date of Birth/Sex: 03/23/1946 (76 y.o. M) ?Treating RN: [name] ?Primary Care Channa Hazelett: [name] Other Clinician: ?Referring [name]: [name] ?Treating [name]/Extender: [name] ?Weeks in Treatment: 0 ?Activities of Daily Living Items ?Answer ?Activities of Daily Living (Please select one for each item) ?Drive Automobile Not Able ?Take Medications Completely Able ?Use Telephone Completely Able ?Care for Appearance Completely Able ?Use Toilet Completely Able ?Bath / Shower Completely Able ?Dress Self Completely Able ?Feed Self Completely Able ?Walk Completely Able ?Get In / Out Bed Completely Able ?Housework Completely Able ?Prepare Meals Completely Able ?Handle Money Completely Able ?Shop for Self Completely Able ?Electronic Signature(s) ?Signed: 02/05/2022 9:16:22 AM By: Cameron Coria RN ?Entered By: [name] on  02/01/2022 08:53:33 ?[name], [name] ([id number]) ?-------------------------------------------------------------------------------- ?Education Screening Details ?Patient Name: [name], [name] ?Date of Service: 02/01/2022 8:45 AM ?Medical Record Number: [id number] ?Patient Account Number: [id number] ?Date of Birth/Sex: 18-May-1946 (76 y.o. M) ?Treating RN: [name] ?Primary Care [name]: [name] Other Clinician: ?Referring [name]: [name] ?Treating [name]/Extender: [name] ?Weeks in Treatment: 0 ?Primary Learner Assessed: Patient ?Learning Preferences/Education Level/Primary Language ?Learning Preference: Explanation ?Highest Education Level: College or Above ?Preferred Language: English ?Cognitive Barrier ?Language Barrier: No ?Translator Needed: No ?Memory Deficit: No ?Emotional Barrier: No ?Cultural/Religious Beliefs Affecting Medical Care: No ?Physical Barrier ?Impaired Vision: Yes Glasses ?Impaired Hearing: No ?Decreased Hand dexterity: No ?Knowledge/Comprehension ?Knowledge Level: Medium ?Comprehension Level: Medium ?Ability to understand written instructions: Medium ?Ability to understand verbal instructions: Medium ?Motivation ?Anxiety Level: Anxious ?Cooperation: Cooperative ?Education Importance: Acknowledges Need ?Interest in Health Problems: Asks Questions ?Perception: Coherent ?Willingness to Engage in Self-Management ?Medium ?Activities: ?Readiness to Engage in Self-Management ?Medium ?Activities: ?Electronic Signature(s) ?Signed: 02/05/2022 9:16:22 AM By: Cameron Coria RN ?Entered By: [name] on 02/01/2022 08:54:13 ?[name], [name] ([id number]) ?-------------------------------------------------------------------------------- ?Fall Risk Assessment Details ?Patient Name: [name], [name] ?Date of Service: 02/01/2022 8:45 AM ?Medical Record Number: [id number] ?Patient Account Number: [id number] ?Date of Birth/Sex: 09/19/1946 (76 y.o. M) ?Treating RN: [name] ?Primary Care  [name]: [name] Other Clinician: ?Referring [name]: [name] ?Treating [name]/Extender: [name] ?Weeks in Treatment: 0 ?Fall Risk Assessment Items ?Have you had 2 or more falls in the last 12 monthso 0 Yes ?Have you had any fall that resulted in injury in the last 12 monthso 0 Yes ?FALLS RISK SCREEN ?History of falling - immediate or within 3 months 25 Yes ?Secondary diagnosis (Do you have 2 or more medical diagnoseso) 0 No ?Ambulatory aid ?None/bed rest/wheelchair/nurse 0 No ?Crutches/cane/walker 0 No ?Furniture 0 No ?Intravenous therapy Access/Saline/Heparin Lock 0 No ?Gait/Transferring ?Normal/ bed rest/ wheelchair 0 No ?Weak (short steps  with or without shuffle, stooped but able to lift head while walking, may ?0 No ?seek support from furniture) ?Impaired (short steps with shuffle, may have difficulty arising from chair, head down, impaired ?0 No ?balance) ?Mental Status ?Oriented to own ability 0 No ?Electronic Signature(s) ?Signed: 02/05/2022 9:16:22 AM By: Cameron Coria RN ?Entered By: [name] on 02/01/2022 08:54:20 ?[name], [name] ([id number]) ?-------------------------------------------------------------------------------- ?Foot Assessment Details ?Patient Name: [name], [name] ?Date of Service: 02/01/2022 8:45 AM ?Medical Record Number: [id number] ?Patient Account Number: [id number] ?Date of Birth/Sex: 07-21-1946 (76 y.o. M) ?Treating RN: [name] ?Primary Care [name]: [name] Other Clinician: ?Referring [name]: [name] ?Treating Irania Montgomery/Extender: [name] ?Weeks in Treatment: 0 ?Foot Assessment Items ?Site Locations ?+ = Sensation present, - = Sensation absent, C = Callus, U = Ulcer ?R = Redness, W = Warmth, M = Maceration, PU = Pre-ulcerative lesion ?F = Fissure, S = Swelling, D = Dryness ?Assessment ?Right: Left: ?Other Deformity: No No ?Prior Foot Ulcer: No No ?Prior Amputation: No No ?Charcot Joint: No No ?Ambulatory Status: Ambulatory Without  Help ?Gait: Steady ?Electronic Signature(s) ?Signed: 02/05/2022 9:16:22 AM By: Cameron Coria RN ?Entered By: [name] on 02/01/2022 09:11:13 ?[name], [name] ([id number]) ?-------------------------------------------------------------------------------- ?Nutrition Risk Screening Details ?Patient Name: [name], [name] ?Date of Service: 02/01/2022 8:45 AM ?Medical Record Number: [id number] ?Patient Account Number: [id number] ?Date of Birth/Sex: October 20, 1946 (76 y.o. M) ?Treating RN: [name] ?Primary Care Pranav Lince: [name] Other Clinician: ?Referring Bird Swetz: [name] ?Treating Alieyah Spader/Extender: [name] ?Weeks in Treatment: 0 ?Height (in): 63 ?Weight (lbs): 150 ?Body Mass Index (BMI): 26.6 ?Nutrition Risk Screening Items ?Score Screening ?NUTRITION RISK SCREEN: ?I have an illness or condition that made me change the kind and/or amount of food I eat 0 No ?I eat fewer than two meals per day 0 No ?I eat few fruits and vegetables, or milk products 0 No ?I have three or more drinks of beer, liquor or wine almost every day 0 No ?I have tooth or mouth problems that make it hard for me to eat 0 No ?I don't always have enough money to buy the food I need 0 No ?I eat alone most of the time 0 No ?I take three or more different prescribed or over-the-counter drugs a day 1 Yes ?Without wanting to, I have lost or gained 10 pounds in the last six months 0 No ?I am not always physically able to shop, cook and/or feed myself 0 No ?Nutrition Protocols ?Good Risk Protocol 0 No interventions needed ?Moderate Risk Protocol ?High Risk Proctocol ?Risk Level: Good Risk ?Score: 1 ?Electronic Signature(s) ?Signed: 02/05/2022 9:16:22 AM By: Cameron Coria RN ?Entered By: [name] on 02/01/2022 08:54:33 ?

## 2022-02-05 NOTE — Progress Notes (Signed)
KYNG, MATLOCK ([id number]) ?Visit Report for 02/01/2022 ?Allergy List Details ?Patient Name: [name], [name] ?Date of Service: 02/01/2022 8:45 AM ?Medical Record Number: [id number] ?Patient Account Number: [id number] ?Date of Birth/Sex: [birth year] (76 y.o. M) ?Treating RN: Carlene Coria ?Primary Care Shekia Kuper: Tracie Harrier Other Clinician: ?Referring Dillyn Menna: Conni Slipper ?Treating Sulema Braid/Extender: Jeri Cos ?Weeks in Treatment: 0 ?Allergies ?Active Allergies ?No Known Allergies ?Allergy Notes ?Electronic Signature(s) ?Signed: 02/05/2022 9:16:22 AM By: Carlene Coria RN ?Entered By: Carlene Coria on 02/01/2022 08:46:43 ?[name], [name] ([id number]) ?-------------------------------------------------------------------------------- ?Arrival Information Details ?Patient Name: [name], [name] ?Date of Service: 02/01/2022 8:45 AM ?Medical Record Number: [id number] ?Patient Account Number: [id number] ?Date of Birth/Sex: 1946/08/18 (76 y.o. M) ?Treating RN: Carlene Coria ?Primary Care Yasmyn Bellisario: Tracie Harrier Other Clinician: ?Referring Floyd Lusignan: Conni Slipper ?Treating Hameed Kolar/Extender: Jeri Cos ?Weeks in Treatment: 0 ?Visit Information ?Patient Arrived: Ambulatory ?Arrival Time: 08:44 ?Accompanied By: self ?Transfer Assistance: None ?Patient Identification Verified: Yes ?Secondary Verification Process Completed: Yes ?Patient Requires Transmission-Based No ?Precautions: ?Patient Has Alerts: Yes ?Patient Alerts: Patient on Blood ?Thinner ?Electronic Signature(s) ?Signed: 02/05/2022 9:16:22 AM By: Carlene Coria RN ?Entered By: Carlene Coria on 02/01/2022 08:44:31 ?[name], [name] ([id number]) ?-------------------------------------------------------------------------------- ?Clinic Level of Care Assessment Details ?Patient Name: [name], [name] ?Date of Service: 02/01/2022 8:45 AM ?Medical Record Number: [id number] ?Patient Account Number: [id number] ?Date of Birth/Sex: 1946-07-26 (76 y.o. M) ?Treating RN: Carlene Coria ?Primary  Care Garlen Reinig: Tracie Harrier Other Clinician: ?Referring Analei Whinery: Conni Slipper ?Treating Sybil Shrader/Extender: Jeri Cos ?Weeks in Treatment: 0 ?Clinic Level of Care Assessment Items ?TOOL 2 Quantity Score ?X - Use when only an EandM is performed on the INITIAL visit 1 0 ?ASSESSMENTS - Nursing Assessment / Reassessment ?X - General Physical Exam (combine w/ comprehensive assessment (listed just below) when performed on new ?1 20 ?pt. evals) ?X- 1 25 ?Comprehensive Assessment (HX, ROS, Risk Assessments, Wounds Hx, etc.) ?ASSESSMENTS - Wound and Skin Assessment / Reassessment ?X - Simple Wound Assessment / Reassessment - one wound 1 5 ?'[]'$  - 0 ?Complex Wound Assessment / Reassessment - multiple wounds ?'[]'$  - 0 ?Dermatologic / Skin Assessment (not related to wound area) ?ASSESSMENTS - Ostomy and/or Continence Assessment and Care ?'[]'$  - Incontinence Assessment and Management 0 ?'[]'$  - 0 ?Ostomy Care Assessment and Management (repouching, etc.) ?PROCESS - Coordination of Care ?X - Simple Patient / Family Education for ongoing care 1 15 ?'[]'$  - 0 ?Complex (extensive) Patient / Family Education for ongoing care ?X- 1 10 ?Staff obtains Consents, Records, Test Results / Process Orders ?'[]'$  - 0 ?Staff telephones [place], Nursing Homes / Clarify orders / etc ?'[]'$  - 0 ?Routine Transfer to another Facility (non-emergent condition) ?'[]'$  - 0 ?Routine Hospital Admission (non-emergent condition) ?'[]'$  - 0 ?New Admissions / Biomedical engineer / Ordering NPWT, Apligraf, etc. ?'[]'$  - 0 ?Emergency Hospital Admission (emergent condition) ?X- 1 10 ?Simple Discharge Coordination ?'[]'$  - 0 ?Complex (extensive) Discharge Coordination ?PROCESS - Special Needs ?'[]'$  - Pediatric / Minor Patient Management 0 ?'[]'$  - 0 ?Isolation Patient Management ?'[]'$  - 0 ?Hearing / Language / Visual special needs ?'[]'$  - 0 ?Assessment of Community assistance (transportation, D/C planning, etc.) ?'[]'$  - 0 ?Additional assistance / Altered mentation ?'[]'$  - 0 ?Support Surface(s)  Assessment (bed, cushion, seat, etc.) ?INTERVENTIONS - Wound Cleansing / Measurement ?X - Wound Imaging (photographs - any number of wounds) 1 5 ?'[]'$  - 0 ?Wound Tracing (instead of photographs) ?X- 1 5 ?Simple Wound Measurement - one wound ?'[]'$  - 0 ?Complex Wound Measurement - multiple wounds ?[name], Cameron V. (  [id number]) ?X- 1 5 ?Simple Wound Cleansing - one wound ?'[]'$  - 0 ?Complex Wound Cleansing - multiple wounds ?INTERVENTIONS - Wound Dressings ?X - Small Wound Dressing one or multiple wounds 1 10 ?'[]'$  - 0 ?Medium Wound Dressing one or multiple wounds ?'[]'$  - 0 ?Large Wound Dressing one or multiple wounds ?'[]'$  - 0 ?Application of Medications - injection ?INTERVENTIONS - Miscellaneous ?'[]'$  - External ear exam 0 ?'[]'$  - 0 ?Specimen Collection (cultures, biopsies, blood, body fluids, etc.) ?'[]'$  - 0 ?Specimen(s) / Culture(s) sent or taken to Lab for analysis ?'[]'$  - 0 ?Patient Transfer (multiple staff / Civil Service fast streamer / Similar devices) ?'[]'$  - 0 ?Simple Staple / Suture removal (25 or less) ?'[]'$  - 0 ?Complex Staple / Suture removal (26 or more) ?'[]'$  - 0 ?Hypo / Hyperglycemic Management (close monitor of Blood Glucose) ?'[]'$  - 0 ?Ankle / Brachial Index (ABI) - do not check if billed separately ?Has the patient been seen at the hospital within the last three years: Yes ?Total Score: 110 ?Level Of Care: New/Established - Level ?3 ?Electronic Signature(s) ?Signed: 02/05/2022 9:16:22 AM By: Carlene Coria RN ?Entered By: Carlene Coria on 02/01/2022 09:34:27 ?[name], [name] ([id number]) ?-------------------------------------------------------------------------------- ?Encounter Discharge Information Details ?Patient Name: [name], [name] ?Date of Service: 02/01/2022 8:45 AM ?Medical Record Number: [id number] ?Patient Account Number: [id number] ?Date of Birth/Sex: [birth year] (76 y.o. M) ?Treating RN: Carlene Coria ?Primary Care Vernice Bowker: Tracie Harrier Other Clinician: ?Referring Maelie Chriswell: Conni Slipper ?Treating Germany Chelf/Extender: Jeri Cos ?Weeks  in Treatment: 0 ?Encounter Discharge Information Items Post Procedure Vitals ?Discharge Condition: Stable ?Temperature (?F): 98.2 ?Ambulatory Status: Ambulatory ?Pulse (bpm): 92 ?Discharge Destination: Home ?Respiratory Rate (breaths/min): 18 ?Transportation: Private Auto ?Blood Pressure (mmHg): 136/79 ?Accompanied By: self ?Schedule Follow-up Appointment: Yes ?Clinical Summary of Care: Patient Declined ?Electronic Signature(s) ?Signed: 02/05/2022 9:16:22 AM By: Carlene Coria RN ?Entered By: Carlene Coria on 02/01/2022 09:35:33 ?[name], [name] ([id number]) ?-------------------------------------------------------------------------------- ?Lower Extremity Assessment Details ?Patient Name: [name], [name] ?Date of Service: 02/01/2022 8:45 AM ?Medical Record Number: [id number] ?Patient Account Number: [id number] ?Date of Birth/Sex: [birth year] (76 y.o. M) ?Treating RN: Carlene Coria ?Primary Care Marieanne Marxen: Tracie Harrier Other Clinician: ?Referring Dorothymae Maciver: Conni Slipper ?Treating Ezreal Turay/Extender: Jeri Cos ?Weeks in Treatment: 0 ?Edema Assessment ?Assessed: [Left: No] [Right: No] ?Edema: [Left: Ye] [Right: s] ?Calf ?Left: Right: ?Point of Measurement: 30 cm From Medial Instep 31 cm ?Ankle ?Left: Right: ?Point of Measurement: 10 cm From Medial Instep 21 cm ?Knee To Floor ?Left: Right: ?From Medial Instep 40 cm ?Vascular Assessment ?Pulses: ?Dorsalis Pedis ?Palpable: [Left:No Inaudible] ?Electronic Signature(s) ?Signed: 02/05/2022 9:16:22 AM By: Carlene Coria RN ?Entered By: Carlene Coria on 02/01/2022 09:15:55 ?[name], [name] ([id number]) ?-------------------------------------------------------------------------------- ?Multi Wound Chart Details ?Patient Name: [name], NORDAHL ?Date of Service: 02/01/2022 8:45 AM ?Medical Record Number: [id number] ?Patient Account Number: [id number] ?Date of Birth/Sex: Jul 31, 1946 (76 y.o. M) ?Treating RN: Carlene Coria ?Primary Care Burgandy Hackworth: Tracie Harrier Other Clinician: ?Referring  Kailiana Granquist: Conni Slipper ?Treating Ranulfo Kall/Extender: Jeri Cos ?Weeks in Treatment: 0 ?Vital Signs ?Height(in): 63 ?Pulse(bpm): 92 ?Weight(lbs): 150 ?Blood Pressure(mmHg): 136/79 ?Body Mass Index(BMI): 26.

## 2022-02-05 NOTE — Progress Notes (Signed)
OREE, HISLOP ([id number]) ?Visit Report for 02/01/2022 ?Chief Complaint Document Details ?Patient Name: [name], [name] ?Date of Service: 02/01/2022 8:45 AM ?Medical Record Number: [id number] ?Patient Account Number: [id number] ?Date of Birth/Sex: March 25, 1946 (76 y.o. M) ?Treating RN: Carlene Coria ?Primary Care Provider: Tracie Harrier Other Clinician: ?Referring Provider: Conni Slipper ?Treating Provider/Extender: Jeri Cos ?Weeks in Treatment: 0 ?Information Obtained from: Patient ?Chief Complaint ?Left LE Ulcer ?Electronic Signature(s) ?Signed: 02/01/2022 9:24:47 AM By: Worthy Keeler PA-C ?Entered By: Worthy Keeler on 02/01/2022 09:24:47 ?[name], [name] ([id number]) ?-------------------------------------------------------------------------------- ?Debridement Details ?Patient Name: [name], [name] ?Date of Service: 02/01/2022 8:45 AM ?Medical Record Number: [id number] ?Patient Account Number: [id number] ?Date of Birth/Sex: 1946/09/30 (76 y.o. M) ?Treating RN: Carlene Coria ?Primary Care Provider: Tracie Harrier Other Clinician: ?Referring Provider: Conni Slipper ?Treating Provider/Extender: Jeri Cos ?Weeks in Treatment: 0 ?Debridement Performed for ?Wound #1 Left,Proximal Lower Leg ?Assessment: ?Performed By: Physician Tommie Sams., PA-C ?Debridement Type: Chemical/Enzymatic/Mechanical ?Agent Used: Saline and gauze ?Severity of Tissue Pre Debridement: Fat layer exposed ?Level of Consciousness (Pre- ?Awake and Alert ?procedure): ?Pre-procedure Verification/Time Out ?Yes - 09:30 ?Taken: ?Start Time: 09:30 ?Pain Control: Lidocaine 4% Topical Solution ?Instrument: ?Other : saline and gauze ?Bleeding: Minimum ?Hemostasis Achieved: Pressure ?End Time: 09:31 ?Procedural Pain: 0 ?Post Procedural Pain: 0 ?Response to Treatment: Procedure was tolerated well ?Level of Consciousness (Post- ?Awake and Alert ?procedure): ?Post Debridement Measurements of Total Wound ?Length: (cm) 2.7 ?Width: (cm) 1.5 ?Depth: (cm)  0.4 ?Volume: (cm?) 1.272 ?Character of Wound/Ulcer Post Debridement: Improved ?Severity of Tissue Post Debridement: Fat layer exposed ?Post Procedure Diagnosis ?Same as Pre-procedure ?Electronic Signature(s) ?Signed: 02/01/2022 4:44:12 PM By: Worthy Keeler PA-C ?Signed: 02/05/2022 9:16:22 AM By: Carlene Coria RN ?Entered By: Carlene Coria on 02/01/2022 09:32:19 ?[name], [name] ([id number]) ?-------------------------------------------------------------------------------- ?HPI Details ?Patient Name: [name], [name] ?Date of Service: 02/01/2022 8:45 AM ?Medical Record Number: [id number] ?Patient Account Number: [id number] ?Date of Birth/Sex: 01-15-1946 (76 y.o. M) ?Treating RN: Carlene Coria ?Primary Care Provider: Tracie Harrier Other Clinician: ?Referring Provider: Conni Slipper ?Treating Provider/Extender: Jeri Cos ?Weeks in Treatment: 0 ?History of Present Illness ?HPI Description: 02-01-2022 upon evaluation today patient appears to be doing poorly in regard to his wound. He has been tolerating the ?dressing changes without complication. With that being said unfortunately he seems to be doing not so well with regard to his blood flow into this ?left lower extremity. He has a previous arterial study which showed that he had poor arterial flow with an ABI in 2018 of 0.53 with a TBI of 0.47 in ?the left leg. In regard to the right leg this was 0.66 for an ABI 0.51 for the TBI. Subsequently it has been obviously about 5 years since that time ?and things seem to have gotten worse we cannot even palpate nor Doppler a pulse here in the clinic today. Currently patient states that this has ?been going on for about 3 months in regard to the wound. Fortunately I do not see any evidence that this is worsening. With that being said there ?is a little bit of evidence of healing which is good news but again he has been using basically just peroxide on the area and really nothing much ?more. ?Patient does have a history of noted  peripheral vascular disease per above, hypertension, chronic viral hepatitis C, and bipolar disorder. He also ?apparently has a history of a stroke. ?Electronic Signature(s) ?Signed: 02/01/2022 10:06:19 AM By: Worthy Keeler PA-C ?Entered By: Worthy Keeler on 02/01/2022  41:28:78 ?[name], [name] ([id number]) ?-------------------------------------------------------------------------------- ?Physical Exam Details ?Patient Name: [name], [name] ?Date of Service: 02/01/2022 8:45 AM ?Medical Record Number: [id number] ?Patient Account Number: [id number] ?Date of Birth/Sex: 25-May-1946 (76 y.o. M) ?Treating RN: Carlene Coria ?Primary Care Provider: Tracie Harrier Other Clinician: ?Referring Provider: Conni Slipper ?Treating Provider/Extender: Jeri Cos ?Weeks in Treatment: 0 ?Constitutional ?sitting or standing blood pressure is within target range for patient.. pulse regular and within target range for patient.Marland Kitchen respirations regular, non- ?labored and within target range for patient.Marland Kitchen temperature within target range for patient.. Well-nourished and well-hydrated in no acute distress. ?Eyes ?conjunctiva clear no eyelid edema noted. pupils equal round and reactive to light and accommodation. ?Ears, Nose, Mouth, and Throat ?no gross abnormality of ear auricles or external auditory canals. normal hearing noted during conversation. mucus membranes moist. ?Respiratory ?normal breathing without difficulty. ?Cardiovascular ?Absent posterior tibial and dorsalis pedis pulses bilateral lower extremities. 1+ pitting edema of the bilateral lower extremities. ?Musculoskeletal ?normal gait and posture. no significant deformity or arthritic changes, no loss or range of motion, no clubbing. ?Psychiatric ?this patient is able to make decisions and demonstrates good insight into disease process. Alert and Oriented x 3. pleasant and cooperative. ?Notes ?Upon inspection patient's wound bed actually showed signs of forearm granulation there  was a little bit of granulation but there was a lot of ?necrotic tissue initially this actually came off quite well just with saline and gauze cleaning. With that being said he did have some remaining ?necrotic tissue to that end I do believe he would benefit from the Iodosorb. Unfortunately he has no pulses of the lower extremity does have some ?swelling as well but again wrapping is not an option here due to his poor arterial flow based on her ABI evaluations today. ?Electronic Signature(s) ?Signed: 02/01/2022 10:06:56 AM By: Worthy Keeler PA-C ?Entered By: Worthy Keeler on 02/01/2022 10:06:55 ?[name], [name] ([id number]) ?-------------------------------------------------------------------------------- ?Physician Orders Details ?Patient Name: [name], [name] ?Date of Service: 02/01/2022 8:45 AM ?Medical Record Number: [id number] ?Patient Account Number: [id number] ?Date of Birth/Sex: Sep 21, 1946 (76 y.o. M) ?Treating RN: Carlene Coria ?Primary Care Provider: Tracie Harrier Other Clinician: ?Referring Provider: Conni Slipper ?Treating Provider/Extender: Jeri Cos ?Weeks in Treatment: 0 ?Verbal / Phone Orders: No ?Diagnosis Coding ?ICD-10 Coding ?Code Description ?I87.2 Venous insufficiency (chronic) (peripheral) ?X51.700 Non-pressure chronic ulcer of other part of left lower leg with fat layer exposed ?I73.89 Other specified peripheral vascular diseases ?I10 Essential (primary) hypertension ?B18.2 Chronic viral hepatitis C ?F31.89 Other bipolar disorder ?Follow-up Appointments ?o Return Appointment in 1 week. ?Bathing/ Shower/ Hygiene ?o May shower; gently cleanse wound with antibacterial soap, rinse and pat dry prior to dressing wounds ?Edema Control - Lymphedema / Segmental Compressive Device / Other ?o Elevate, Exercise Daily and Avoid Standing for Long Periods of Time. ?o Elevate legs to the level of the heart and pump ankles as often as possible ?o Elevate leg(s) parallel to the floor when  sitting. ?Wound Treatment ?Wound #1 - Lower Leg Wound Laterality: Left, Proximal ?Cleanser: [employer] Ancillary Kit - 15 Day Supply (DME) (Generic) 3 x Per Week/30 Days ?Discharge Instructions: Use supplies as instruct

## 2022-02-07 ENCOUNTER — Inpatient Hospital Stay
Admission: EM | Admit: 2022-02-07 | Discharge: 2022-02-14 | DRG: 917 | Disposition: A | Discharge status: Home / Self Care | Payer: Medicare PPO | Attending: Family Medicine | Admitting: Family Medicine

## 2022-02-07 ENCOUNTER — Encounter: Payer: Self-pay | Admitting: Emergency Medicine

## 2022-02-07 ENCOUNTER — Emergency Department: Payer: Medicare PPO

## 2022-02-07 ENCOUNTER — Other Ambulatory Visit: Payer: Self-pay

## 2022-02-07 DIAGNOSIS — F1910 Other psychoactive substance abuse, uncomplicated: Secondary | ICD-10-CM | POA: Diagnosis present

## 2022-02-07 DIAGNOSIS — E1165 Type 2 diabetes mellitus with hyperglycemia: Secondary | ICD-10-CM | POA: Diagnosis present

## 2022-02-07 DIAGNOSIS — F1210 Cannabis abuse, uncomplicated: Secondary | ICD-10-CM | POA: Diagnosis present

## 2022-02-07 DIAGNOSIS — R4189 Other symptoms and signs involving cognitive functions and awareness: Secondary | ICD-10-CM

## 2022-02-07 DIAGNOSIS — S81802A Unspecified open wound, left lower leg, initial encounter: Secondary | ICD-10-CM | POA: Diagnosis present

## 2022-02-07 DIAGNOSIS — F12229 Cannabis dependence with intoxication, unspecified: Secondary | ICD-10-CM | POA: Diagnosis present

## 2022-02-07 DIAGNOSIS — G928 Other toxic encephalopathy: Secondary | ICD-10-CM | POA: Diagnosis present

## 2022-02-07 DIAGNOSIS — F32A Depression, unspecified: Secondary | ICD-10-CM | POA: Diagnosis present

## 2022-02-07 DIAGNOSIS — R739 Hyperglycemia, unspecified: Secondary | ICD-10-CM | POA: Diagnosis present

## 2022-02-07 DIAGNOSIS — Z79891 Long term (current) use of opiate analgesic: Secondary | ICD-10-CM

## 2022-02-07 DIAGNOSIS — K219 Gastro-esophageal reflux disease without esophagitis: Secondary | ICD-10-CM | POA: Diagnosis present

## 2022-02-07 DIAGNOSIS — T40712A Poisoning by cannabis, intentional self-harm, initial encounter: Principal | ICD-10-CM | POA: Diagnosis present

## 2022-02-07 DIAGNOSIS — M1990 Unspecified osteoarthritis, unspecified site: Secondary | ICD-10-CM | POA: Diagnosis present

## 2022-02-07 DIAGNOSIS — Z981 Arthrodesis status: Secondary | ICD-10-CM

## 2022-02-07 DIAGNOSIS — G4700 Insomnia, unspecified: Secondary | ICD-10-CM | POA: Diagnosis present

## 2022-02-07 DIAGNOSIS — R404 Transient alteration of awareness: Secondary | ICD-10-CM

## 2022-02-07 DIAGNOSIS — R45851 Suicidal ideations: Secondary | ICD-10-CM

## 2022-02-07 DIAGNOSIS — Z91148 Patient's other noncompliance with medication regimen for other reason: Secondary | ICD-10-CM

## 2022-02-07 DIAGNOSIS — E876 Hypokalemia: Secondary | ICD-10-CM | POA: Diagnosis present

## 2022-02-07 DIAGNOSIS — Z6825 Body mass index (BMI) 25.0-25.9, adult: Secondary | ICD-10-CM

## 2022-02-07 DIAGNOSIS — G8929 Other chronic pain: Secondary | ICD-10-CM | POA: Diagnosis present

## 2022-02-07 DIAGNOSIS — F1220 Cannabis dependence, uncomplicated: Secondary | ICD-10-CM | POA: Diagnosis present

## 2022-02-07 DIAGNOSIS — Z8673 Personal history of transient ischemic attack (TIA), and cerebral infarction without residual deficits: Secondary | ICD-10-CM

## 2022-02-07 DIAGNOSIS — Z833 Family history of diabetes mellitus: Secondary | ICD-10-CM

## 2022-02-07 DIAGNOSIS — Z79899 Other long term (current) drug therapy: Secondary | ICD-10-CM

## 2022-02-07 DIAGNOSIS — F12129 Cannabis abuse with intoxication, unspecified: Principal | ICD-10-CM

## 2022-02-07 DIAGNOSIS — E119 Type 2 diabetes mellitus without complications: Secondary | ICD-10-CM

## 2022-02-07 DIAGNOSIS — G9340 Encephalopathy, unspecified: Secondary | ICD-10-CM | POA: Diagnosis present

## 2022-02-07 DIAGNOSIS — Z7982 Long term (current) use of aspirin: Secondary | ICD-10-CM

## 2022-02-07 DIAGNOSIS — Z7902 Long term (current) use of antithrombotics/antiplatelets: Secondary | ICD-10-CM

## 2022-02-07 DIAGNOSIS — Z9181 History of falling: Secondary | ICD-10-CM

## 2022-02-07 DIAGNOSIS — R4587 Impulsiveness: Secondary | ICD-10-CM | POA: Diagnosis present

## 2022-02-07 DIAGNOSIS — Z961 Presence of intraocular lens: Secondary | ICD-10-CM | POA: Diagnosis present

## 2022-02-07 DIAGNOSIS — E785 Hyperlipidemia, unspecified: Secondary | ICD-10-CM | POA: Diagnosis present

## 2022-02-07 DIAGNOSIS — M545 Low back pain, unspecified: Secondary | ICD-10-CM | POA: Diagnosis present

## 2022-02-07 DIAGNOSIS — F1410 Cocaine abuse, uncomplicated: Secondary | ICD-10-CM | POA: Diagnosis present

## 2022-02-07 DIAGNOSIS — I10 Essential (primary) hypertension: Secondary | ICD-10-CM | POA: Diagnosis present

## 2022-02-07 DIAGNOSIS — F17210 Nicotine dependence, cigarettes, uncomplicated: Secondary | ICD-10-CM | POA: Diagnosis present

## 2022-02-07 DIAGNOSIS — Z9049 Acquired absence of other specified parts of digestive tract: Secondary | ICD-10-CM

## 2022-02-07 DIAGNOSIS — F3181 Bipolar II disorder: Secondary | ICD-10-CM | POA: Diagnosis present

## 2022-02-07 DIAGNOSIS — E663 Overweight: Secondary | ICD-10-CM | POA: Diagnosis present

## 2022-02-07 DIAGNOSIS — F419 Anxiety disorder, unspecified: Secondary | ICD-10-CM | POA: Diagnosis present

## 2022-02-07 HISTORY — DX: Patient's other noncompliance with medication regimen for other reason: Z91.148

## 2022-02-07 HISTORY — DX: Other symptoms and signs involving cognitive functions and awareness: R41.89

## 2022-02-07 HISTORY — DX: Transient alteration of awareness: R40.4

## 2022-02-07 LAB — CBC WITH DIFFERENTIAL/PLATELET
Abs Immature Granulocytes: 0.03 10*3/uL (ref 0.00–0.07)
Basophils Absolute: 0 10*3/uL (ref 0.0–0.1)
Basophils Relative: 0 %
Eosinophils Absolute: 0 10*3/uL (ref 0.0–0.5)
Eosinophils Relative: 0 %
HCT: 40.6 % (ref 39.0–52.0)
Hemoglobin: 13.3 g/dL (ref 13.0–17.0)
Immature Granulocytes: 0 %
Lymphocytes Relative: 15 %
Lymphs Abs: 1.7 10*3/uL (ref 0.7–4.0)
MCH: 29.8 pg (ref 26.0–34.0)
MCHC: 32.8 g/dL (ref 30.0–36.0)
MCV: 90.8 fL (ref 80.0–100.0)
Monocytes Absolute: 0.7 10*3/uL (ref 0.1–1.0)
Monocytes Relative: 7 %
Neutro Abs: 8.7 10*3/uL — ABNORMAL HIGH (ref 1.7–7.7)
Neutrophils Relative %: 78 %
Platelets: 221 10*3/uL (ref 150–400)
RBC: 4.47 MIL/uL (ref 4.22–5.81)
RDW: 15.2 % (ref 11.5–15.5)
WBC: 11.2 10*3/uL — ABNORMAL HIGH (ref 4.0–10.5)
nRBC: 0 % (ref 0.0–0.2)

## 2022-02-07 LAB — COMPREHENSIVE METABOLIC PANEL
ALT: 16 U/L (ref 0–44)
AST: 22 U/L (ref 15–41)
Albumin: 4.3 g/dL (ref 3.5–5.0)
Alkaline Phosphatase: 69 U/L (ref 38–126)
Anion gap: 10 (ref 5–15)
BUN: 17 mg/dL (ref 8–23)
CO2: 28 mmol/L (ref 22–32)
Calcium: 9.3 mg/dL (ref 8.9–10.3)
Chloride: 99 mmol/L (ref 98–111)
Creatinine, Ser: 0.94 mg/dL (ref 0.61–1.24)
GFR, Estimated: >60 mL/min (ref >60)
Glucose, Bld: 149 mg/dL — ABNORMAL HIGH (ref 70–99)
Potassium: 3.4 mmol/L — ABNORMAL LOW (ref 3.5–5.1)
Sodium: 137 mmol/L (ref 135–145)
Total Bilirubin: 0.5 mg/dL (ref 0.3–1.2)
Total Protein: 8 g/dL (ref 6.5–8.1)

## 2022-02-07 LAB — URINALYSIS, ROUTINE W REFLEX MICROSCOPIC
Bilirubin Urine: NEGATIVE
Glucose, UA: NEGATIVE mg/dL
Hgb urine dipstick: NEGATIVE
Ketones, ur: NEGATIVE mg/dL
Leukocytes,Ua: NEGATIVE
Nitrite: NEGATIVE
Protein, ur: NEGATIVE mg/dL
Specific Gravity, Urine: 1.008 (ref 1.005–1.030)
pH: 6 (ref 5.0–8.0)

## 2022-02-07 LAB — MAGNESIUM: Magnesium: 2.2 mg/dL (ref 1.7–2.4)

## 2022-02-07 MED ORDER — LACTATED RINGERS IV BOLUS
1000.0000 mL | Freq: Once | INTRAVENOUS | Status: AC
  Administered 2022-02-07: 1000 mL via INTRAVENOUS

## 2022-02-07 MED ORDER — ASPIRIN EC 81 MG PO TBEC
81.0000 mg | DELAYED_RELEASE_TABLET | Freq: Every day | ORAL | Status: DC
  Administered 2022-02-08 – 2022-02-14 (×7): 81 mg via ORAL
  Filled 2022-02-07 (×7): qty 1

## 2022-02-07 MED ORDER — NICOTINE 21 MG/24HR TD PT24
21.0000 mg | MEDICATED_PATCH | Freq: Every day | TRANSDERMAL | Status: DC | PRN
  Administered 2022-02-09 – 2022-02-13 (×4): 21 mg via TRANSDERMAL
  Filled 2022-02-07 (×4): qty 1

## 2022-02-07 MED ORDER — ATORVASTATIN CALCIUM 80 MG PO TABS
40.0000 mg | ORAL_TABLET | Freq: Every day | ORAL | Status: DC
  Administered 2022-02-08 – 2022-02-13 (×6): 40 mg via ORAL
  Filled 2022-02-07: qty 2
  Filled 2022-02-07: qty 0.5
  Filled 2022-02-07 (×2): qty 2
  Filled 2022-02-07 (×2): qty 0.5

## 2022-02-07 MED ORDER — MELATONIN 5 MG PO TABS
5.0000 mg | ORAL_TABLET | Freq: Every evening | ORAL | Status: DC | PRN
  Administered 2022-02-08 – 2022-02-12 (×3): 5 mg via ORAL
  Filled 2022-02-07 (×4): qty 1

## 2022-02-07 MED ORDER — ACETAMINOPHEN 325 MG PO TABS
650.0000 mg | ORAL_TABLET | Freq: Four times a day (QID) | ORAL | Status: AC | PRN
  Administered 2022-02-09 – 2022-02-10 (×2): 650 mg via ORAL
  Filled 2022-02-07 (×2): qty 2

## 2022-02-07 MED ORDER — ENOXAPARIN SODIUM 40 MG/0.4ML IJ SOSY
40.0000 mg | PREFILLED_SYRINGE | Freq: Every day | INTRAMUSCULAR | Status: DC
  Administered 2022-02-07 – 2022-02-13 (×6): 40 mg via SUBCUTANEOUS
  Filled 2022-02-07 (×7): qty 0.4

## 2022-02-07 MED ORDER — POTASSIUM CHLORIDE 10 MEQ/100ML IV SOLN
10.0000 meq | Freq: Once | INTRAVENOUS | Status: AC
  Administered 2022-02-07: 10 meq via INTRAVENOUS
  Filled 2022-02-07: qty 100

## 2022-02-07 MED ORDER — ACETAMINOPHEN 650 MG RE SUPP: 650.0000 mg | Freq: Four times a day (QID) | RECTAL | Status: AC | PRN

## 2022-02-07 MED ORDER — ONDANSETRON HCL 4 MG PO TABS: 4.0000 mg | ORAL_TABLET | Freq: Four times a day (QID) | ORAL | Status: DC | PRN

## 2022-02-07 MED ORDER — ONDANSETRON HCL 4 MG/2ML IJ SOLN: 4.0000 mg | Freq: Four times a day (QID) | INTRAMUSCULAR | Status: DC | PRN

## 2022-02-07 NOTE — Assessment & Plan Note (Addendum)
Replaced and resolved. ?

## 2022-02-07 NOTE — Assessment & Plan Note (Addendum)
Advised to quit illegal drugs. ?

## 2022-02-07 NOTE — ED Notes (Signed)
Multiple attempts made by this RN to contact the patients family to provide an update on the patient and his admission status.  ?

## 2022-02-07 NOTE — Hospital Course (Addendum)
This 76 year old male with PMH significant of hypertension, depression, anxiety and polysubstance abuse who presented to the emergency room with Altered mentation. According to the patient's ex-wife, he had been taking a lot of delta 8 THC Gummies, he consistently remains sleepy..  Blood work and other work-up unremarkable.   ?Patient fully awake and alert x 3, Able to ambulate without difficulty.  He is fully aware of his surroundings and able to answer questions appropriately.  Felt to medically stable for discharge.  However, family stated that they would not take him home unless he goes to drug rehab, which the patient is willing to do.  Case management attempted to find a drug rehab facility, but only outpatient was available, not inpatient.  Prior to discharge, on 4/24, family shared concerns that patient has in the past tried to intentionally overdose and says that he does not want to live anymore.  When this was revealed, patient involuntarily committed.  Psychiatry consulted.  Patient deemed not a candidate for inpatient psych hospitalization.  Patient does not appear suicidal.  Involuntary commitment discontinued.  Patient feels better and patient is being discharged home ?

## 2022-02-07 NOTE — Assessment & Plan Note (Signed)
-   Patient's opens eyes briefly and mumbles to sternal and loud verbal stimuli ?- Admit to telemetry medical, observation ?- Anticipate discharge in the a.m. ?

## 2022-02-07 NOTE — ED Notes (Signed)
Pt Ex-wife Pamala Hurry) contacted an provide an update on the admission and the incident of a fall. Pts wife verbalized understanding and has no concerns at this time.  ?

## 2022-02-07 NOTE — Assessment & Plan Note (Addendum)
Patient reports history of heroin, cocaine, marijuana use.  Urine drug screen notes THC and tricyclic's.   ?Patient states he took too many [place].  Now he is more alert, family refused to take him home saying that he needed to go to drug rehab as he has done this often.  Patient initially amenable to this, but no inpatient drug rehab facilities available in the outpatient.  Family is concerned about patient being suicidal as above. ?

## 2022-02-07 NOTE — ED Provider Notes (Signed)
? ?[facility] ?Provider Note ? ? ? Event Date/Time  ? First MD Initiated Contact with Patient 02/07/22 1507   ?  (approximate) ? ? ?History  ? ?Unresponsive ? ? ?HPI ? ?[name] is a 76 y.o. male who presents to the ED for evaluation of Unresponsive ?  ?I reviewed PCP visit from 2/1.  History of HTN, HLD, Stroke ? ?Patient presents to the ED by EMS from home for evaluation of poor responsiveness.  He is unable to provide any relevant history.  ? ?EMS brings a small retailed bag of delta 8 THC Gummies and says that he has been eating these today. ? ?I later called his wife as documented below and she tells me that he "just keep getting high" and that she is tired of it.  She does not know of any acute events such as falls, syncopal episodes or recent illnesses. ? ?Physical Exam  ? ?Triage Vital Signs: ?ED Triage Vitals [02/07/22 1509]  ?Enc Vitals Group  ?   BP   ?   Pulse   ?   Resp   ?   Temp   ?   Temp src   ?   SpO2 99 %  ?   Weight   ?   Height   ?   Head Circumference   ?   Peak Flow   ?   Pain Score   ?   Pain Loc   ?   Pain Edu?   ?   Excl. in [place]?   ? ? ?Most recent vital signs: ?Vitals:  ? 02/07/22 1937 02/07/22 2000  ?BP: (!) 144/80 (!) 158/86  ?Pulse: 77 78  ?Resp: 18 18  ?Temp: 98.4 ?F (36.9 ?C)   ?SpO2: 100% 100%  ? ? ?General: Somnolent without distress.  Arouses briefly to sternal rub, but does not stay awake.  Pupils midrange and PERRL.  ?CV:  Good peripheral perfusion. RRR ?Resp:  Normal effort.  ?Abd:  No distention.  Soft and benign ?MSK:  No deformity noted.  No signs of trauma ?Neuro:  No focal deficits appreciated. Cranial nerves II through XII intact ?5/5 strength and sensation in all 4 extremities ?GCS 10 (E2,V3M5) ?Other:   ? ? ?ED Results / Procedures / Treatments  ? ?Labs ?(all labs ordered are listed, but only abnormal results are displayed) ?Labs Reviewed  ?URINALYSIS, ROUTINE W REFLEX MICROSCOPIC - Abnormal; Notable for the following components:  ?    Result  Value  ? Color, Urine STRAW (*)   ? APPearance CLEAR (*)   ? All other components within normal limits  ?COMPREHENSIVE METABOLIC PANEL - Abnormal; Notable for the following components:  ? Potassium 3.4 (*)   ? Glucose, Bld 149 (*)   ? All other components within normal limits  ?CBC WITH DIFFERENTIAL/PLATELET - Abnormal; Notable for the following components:  ? WBC 11.2 (*)   ? Neutro Abs 8.7 (*)   ? All other components within normal limits  ?BASIC METABOLIC PANEL  ?CBC  ? ? ?EKG ?Sinus rhythm, rate of 82 bpm.  Normal axis and intervals.  Wandering and V6 lead.  No STEMI. ? ?RADIOLOGY ?CT head reviewed by me without evidence of acute intracranial pathology ?CXR reviewed by me without evidence of acute cardiopulmonary pathology. ? ?Official radiology report(s): ?CT HEAD WO CONTRAST (5MM) ? ?Result Date: 02/07/2022 ?CLINICAL DATA:  Unresponsive. EXAM: CT HEAD WITHOUT CONTRAST TECHNIQUE: Contiguous axial images were obtained from the base of the  skull through the vertex without intravenous contrast. RADIATION DOSE REDUCTION: This exam was performed according to the departmental dose-optimization program which includes automated exposure control, adjustment of the mA and/or kV according to patient size and/or use of iterative reconstruction technique. COMPARISON:  MRI brain dated October 11, 2020. CT head dated October 10, 2020. FINDINGS: Brain: No evidence of acute infarction, hemorrhage, hydrocephalus, extra-axial collection or mass lesion/mass effect. 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 Stable mild atrophy. Vascular: Atherosclerotic vascular calcification of the carotid siphons. No hyperdense vessel. Skull: Normal. Negative for fracture or focal lesion. Sinuses/Orbits: No acute finding. Other: None. IMPRESSION: 1. No acute intracranial abnormality. 2. Old right occipital lobe infarct. Electronically Signed   By: Titus Dubin M.D.   On: 02/07/2022 16:02  ? ?DG Chest Portable 1 View ? ?Result Date:  02/07/2022 ?CLINICAL DATA:  Unresponsive EXAM: PORTABLE CHEST 1 VIEW COMPARISON:  10/09/2020 FINDINGS: Single frontal view of the chest demonstrates an unremarkable cardiac silhouette. Mild diffuse interstitial prominence without focal airspace disease, effusion, or pneumothorax. No acute bony abnormalities. IMPRESSION: 1. Diffuse interstitial prominence, which may reflect mild interstitial edema. No airspace disease. Electronically Signed   By: Randa Ngo M.D.   On: 02/07/2022 15:38   ? ?PROCEDURES and INTERVENTIONS: ? ?.1-3 Lead EKG Interpretation ?Performed by: Vladimir Crofts, MD ?Authorized by: Vladimir Crofts, MD  ? ?  Interpretation: normal   ?  ECG rate:  70 ?  ECG rate assessment: normal   ?  Rhythm: sinus rhythm   ?  Ectopy: none   ?  Conduction: normal   ?.Critical Care ?Performed by: Vladimir Crofts, MD ?Authorized by: Vladimir Crofts, MD  ? ?Critical care provider statement:  ?  Critical care time (minutes):  30 ?  Critical care time was exclusive of:  Separately billable procedures and treating other patients ?  Critical care was necessary to treat or prevent imminent or life-threatening deterioration of the following conditions:  Toxidrome ?  Critical care was time spent personally by me on the following activities:  Development of treatment plan with patient or surrogate, discussions with consultants, evaluation of patient's response to treatment, examination of patient, ordering and review of laboratory studies, ordering and review of radiographic studies, ordering and performing treatments and interventions, pulse oximetry, re-evaluation of patient's condition and review of old charts ? ?Medications  ?aspirin EC tablet 81 mg (has no administration in time range)  ?atorvastatin (LIPITOR) tablet 40 mg (has no administration in time range)  ?nicotine (NICODERM CQ - dosed in mg/24 hours) patch 21 mg (has no administration in time range)  ?melatonin tablet 5 mg (has no administration in time range)   ?acetaminophen (TYLENOL) tablet 650 mg (has no administration in time range)  ?  Or  ?acetaminophen (TYLENOL) suppository 650 mg (has no administration in time range)  ?ondansetron (ZOFRAN) tablet 4 mg (has no administration in time range)  ?  Or  ?ondansetron (ZOFRAN) injection 4 mg (has no administration in time range)  ?enoxaparin (LOVENOX) injection 40 mg (has no administration in time range)  ?lactated ringers bolus 1,000 mL (1,000 mLs Intravenous New Bag/Given 02/07/22 1521)  ? ? ? ?IMPRESSION / MDM / ASSESSMENT AND PLAN / ED COURSE  ?I reviewed the triage vital signs and the nursing notes. ? ?76 year old male presents to the ED for evaluation of poor responsiveness, likely overuse of delta 8 THC Gummies and requiring observation admission.  Exam is nonfocal with GCS as low as 10, improving with time and IV fluids.  Minimal and nonspecific leukocytosis is noted.  Normal metabolic panel and urinalysis.  CXR is clear and CT head as well.  No signs of cardiac dysrhythmias or other toxidromes.  Suspect overuse of this delta 8 THC.  Despite being observed for nearly 6 hours, he is still poorly responsive and does not return back to baseline.  Consulted medicine who agrees to admit for observation. ? ?Clinical Course as of 02/07/22 2049  ?Thu Feb 07, 2022  ?1556 I call Sallye Ober, Ex Wife. She was with him today and called 911. He was downstairs, she "immediately knew that he was high... just have to look at his eyes." She reports being tired of him getting stoned a lot as she has to take care of granddaughter who is in virtual school. He kept dozing off and stuff, having a hard time waking him up.  so she called an ambulance. Not aware of co-ingestions.  [DS]  ?[place].  Little more awake. [DS]  ?1936 Reassessed.  More awake. [DS]  ?2021 Reassessed.  While he has improved from his initial presentation, he will not consistently stay awake.  He is still altered and not interacting appropriately.  We will  consult with medicine. [DS]  ?2041 I consult with medicine, who agrees to admit [DS]  ?  ?Clinical Course User Index ?[DS] Vladimir Crofts, MD  ? ? ? ?FINAL CLINICAL IMPRESSION(S) / ED DIAGNOSES  ? ?Final diagnoses:  ?Meriam Sprague

## 2022-02-07 NOTE — ED Notes (Signed)
Patient to CT at this time

## 2022-02-07 NOTE — H&P (Addendum)
?History and Physical  ? ?[name] [name] DOB: 1946/10/16 DOA: 02/07/2022 ? ?PCP: Tracie Harrier, MD  ?Patient coming from: Home via EMS ? ?I have personally briefly reviewed patient's old medical records in [place]. ? ?Chief Concern: Altered mental status/unresponsive ? ?HPI: [name] is a 76 year old male with history of hypertension, depression, anxiety, insomnia, behavioral changes, who presents emergency department for chief concerns of unresponsiveness. ? ?Per ED report, patient took an unknown amount of Delta 8 THC Gummies. ? ?Initial vitals in the emergency department show temperature of 98.1, respiration rate of 20, heart rate of 75, blood pressure 180/83, SPO2 100% on room air. ? ?Serum sodium is 137, potassium 3.4, chloride 99, bicarb 28, BUN of 17, serum creatinine of 0.94, GFR greater than 60, nonfasting blood glucose 149, WBC 11.2, hemoglobin 13.3, platelets of 221. ? ?UA was negative for nitrates and leukocytes. ? ?Imaging:  ? ?Portable chest x-ray was read as diffuse interstitial prominence, may reflect mild interstitial edema.  No airspace disease. ? ?CT of the head without contrast: Was read as no acute intracranial abnormality.  Old right occipital lobe infarct. ? ?ED treatment: patient received lactated ringer 1 L bolus. ? ?At bedside patient mumbles to loud verbal stimuli and sternal rub.  Patient is maintaining airway.  He does not appear to be in acute distress.   ? ?He currently lives with his ex-wife Pamala Hurry.  Per Pamala Hurry, patient went to get delta 8 Gummies and she told him not to use it.  She reports that she last saw him at his hbaseline self in the morning on day of admission, between 5 and 6 A.  Then she saw him sitting on the deck, and he was not responsive, prompting her to call EMS. ? ?Social history: He lives at home with his ex-wife Pamala Hurry.  ? ?ROS: Unable to complete as patient is sleeping ? ?ED Course: Gust with emergency medicine provider, patient  requiring hospitalization for chief concerns of being high and now not waking up appropriately to be discharged home. ? ?Assessment/Plan ? ?Principal Problem: ?  Unresponsive episode ?Active Problems: ?  Essential hypertension ?  Anxiety and depression ?  Hyperlipidemia ?  Tetrahydrocannabinol (THC) use disorder, moderate, dependence ([place]) ?  Hypokalemia ?  Polysubstance abuse ([place]) ?  ?Assessment and Plan: ? ?* Unresponsive episode ?- Patient's opens eyes briefly and mumbles to sternal and loud verbal stimuli ?- Admit to telemetry medical, observation ?- Anticipate discharge in the a.m. ? ?Polysubstance abuse ([place]) ?- History of heroin, cocaine, marijuana use ? ?Hypokalemia ?- Check magnesium ?- Potassium chloride 10 mill equivalent, one-time dose ordered ? ?Tetrahydrocannabinol (THC) use disorder, moderate, dependence ([place]) ?- Patient is arousable however counseling not given due to increased somnolence ? ?Aspiration and fall precautions ? ?Chart reviewed.  ? ?DVT prophylaxis: Enoxaparin ?Code Status: Full code ?Diet: N.p.o. except for sips with meds ?Family Communication: Updated ex-wife Pamala Hurry ?Disposition Plan: Pending clinical course ?Consults called: None at this time ?Admission status: Telemetry medical, observation anticipate discharge in the a.m. ? ?Past Medical History:  ?Diagnosis Date  ? Anxiety   ? Arthritis   ? DDD, lumbar radiculopathy  ? Bell's palsy   ? hx of, right side  ? Bell's palsy   ? Chronic back pain   ? followed by pain clinic at [place]  ? Depression   ? hx of depression  ? GERD (gastroesophageal reflux disease)   ? Hepatitis   ? hx of hepatitis C treated with  [facility] per patient  ? History of kidney stones   ? Hypertension   ? Kidney stone   ? hx of  ? Pre-diabetes   ? Stroke ([place]) 02/2018  ? loss of peripheral vision and balance unsteady  ? Substance abuse ([place])   ? heroin  ? Wears dentures   ? Has full upper.  Does not wear.  ? ?Past Surgical History:  ?Procedure Laterality Date  ?  ANTERIOR LUMBAR FUSION  05/13/2012  ? Procedure: ANTERIOR LUMBAR FUSION 1 LEVEL;  Surgeon: Melina Schools, MD;  Location: [facility];  Service: Orthopedics;  Laterality: N/A;  ALIF L5-S1  ? CATARACT EXTRACTION W/PHACO Left 01/01/2021  ? Procedure: CATARACT EXTRACTION PHACO AND INTRAOCULAR LENS PLACEMENT ([facility]) LEFT DIABETIC 2.28 00:24.7;  Surgeon: Eulogio Bear, MD;  Location: [facility];  Service: Ophthalmology;  Laterality: Left;  covid + 11-30-20  ? CATARACT EXTRACTION W/PHACO Right 01/15/2021  ? Procedure: CATARACT EXTRACTION PHACO AND INTRAOCULAR LENS PLACEMENT ([place]) RIGHT DIABETIC;  Surgeon: Eulogio Bear, MD;  Location: [facility];  Service: Ophthalmology;  Laterality: Right;  2.62 ?0:34.5  ? CHOLECYSTECTOMY    ? COLONOSCOPY WITH PROPOFOL N/A 09/19/2021  ? Procedure: COLONOSCOPY WITH PROPOFOL;  Surgeon: Toledo, Benay Pike, MD;  Location: [facility];  Service: Gastroenterology;  Laterality: N/A;  ? CYSTOSCOPY W/ RETROGRADES Left 08/04/2018  ? Procedure: CYSTOSCOPY WITH RETROGRADE PYELOGRAM;  Surgeon: Abbie Sons, MD;  Location: [facility];  Service: Urology;  Laterality: Left;  ? CYSTOSCOPY WITH INSERTION OF UROLIFT N/A 01/18/2020  ? Procedure: CYSTOSCOPY WITH INSERTION OF UROLIFT;  Surgeon: Abbie Sons, MD;  Location: [facility];  Service: Urology;  Laterality: N/A;  ? CYSTOSCOPY WITH STENT PLACEMENT Left 08/04/2018  ? Procedure: CYSTOSCOPY WITH STENT PLACEMENT;  Surgeon: Abbie Sons, MD;  Location: [facility];  Service: Urology;  Laterality: Left;  ? EXTRACORPOREAL SHOCK WAVE LITHOTRIPSY  2012  ? EXTRACORPOREAL SHOCK WAVE LITHOTRIPSY Left 08/20/2018  ? Procedure: EXTRACORPOREAL SHOCK WAVE LITHOTRIPSY (ESWL);  Surgeon: Hollice Espy, MD;  Location: [facility];  Service: Urology;  Laterality: Left; (cancelled)  ? EXTRACORPOREAL SHOCK WAVE LITHOTRIPSY Left 08/27/2018  ? Procedure: EXTRACORPOREAL SHOCK WAVE LITHOTRIPSY (ESWL);  Surgeon: Abbie Sons, MD;  Location: [facility];   Service: Urology;  Laterality: Left;  ? EXTRACORPOREAL SHOCK WAVE LITHOTRIPSY Right 10/15/2018  ? Procedure: EXTRACORPOREAL SHOCK WAVE LITHOTRIPSY (ESWL);  Surgeon: Billey Co, MD;  Location: [facility];  Service: Urology;  Laterality: Right;  ? KIDNEY STONE SURGERY    ? LUMBAR FUSION  05/13/2012  ? ?Social History:  reports that he has been smoking cigarettes. He has a 13.25 pack-year smoking history. He has never used smokeless tobacco. He reports that he does not currently use drugs after having used the following drugs: Heroin, Cocaine, and Marijuana. He reports that he does not drink alcohol. ? ?No Known Allergies ?Family History  ?Problem Relation Age of Onset  ? Stroke Mother   ? Diabetes Mother   ? Lung cancer Father   ? Diabetes Father   ? ?Family history: Family history reviewed and not pertinent ? ?Prior to Admission medications   ?Medication Sig Start Date End Date Taking? Authorizing Provider  ?acetaminophen (TYLENOL) 325 MG tablet Take 2 tablets (650 mg total) by mouth every 6 (six) hours as needed for mild pain (or Fever >/= 101). 10/17/20  Yes Wieting, Richard, MD  ?amLODipine (NORVASC) 2.5 MG tablet Take 2.5 mg by mouth daily. 01/11/22  Yes [provider]  ?aspirin  EC 81 MG EC tablet Take 1 tablet (81 mg total) by mouth daily. Swallow whole. 10/18/20  Yes Wieting, Richard, MD  ?atorvastatin (LIPITOR) 40 MG tablet Take 40 mg by mouth daily.   Yes [provider]  ?baclofen (LIORESAL) 10 MG tablet Take 10 mg by mouth 3 (three) times daily.   Yes [provider]  ?clopidogrel (PLAVIX) 75 MG tablet Take 75 mg by mouth daily.   Yes [provider]  ?CVS D3 25 MCG (1000 UT) capsule Take 2,000 Units by mouth daily. 11/30/21  Yes [provider]  ?escitalopram (LEXAPRO) 10 MG tablet Take 10 mg by mouth daily.   Yes [provider]  ?feeding supplement (ENSURE ENLIVE / ENSURE PLUS) LIQD Take 237 mLs by mouth 2 (two) times daily between meals. 10/17/20   Yes Wieting, Richard, MD  ?folic acid (FOLVITE) 1 MG tablet Take 1 tablet (1 mg total) by mouth daily. 10/18/20  Yes Wieting, Richard, MD  ?furosemide (LASIX) 20 MG tablet Take 20 mg by mouth daily. 01/22/21  Yes

## 2022-02-07 NOTE — ED Notes (Signed)
Dr. Tobie Poet at the bedside. ?

## 2022-02-07 NOTE — ED Notes (Signed)
EDP was at the bedside assessing the patient; upon exiting the room provider had not returned the side rail to full locked position. The patient had rolled onto the floor trying to adjust in bed and was found by this RN. The pt denies any injury; decline family notification. EDP was notified immediately and was present at the bedside to assess the pt. Charge RN notified of the incident. Pt was assisted back into bed by this [place], RN. Kory, RN and this RN assessed the patient skin for injuries and breakdown - none noted. This pt was triaged at a high fall risk and had not been given a bed alarm or non-slip socks; both were provided to the patient. Call bell within reach; stretcher in lowest position, VSS.  ?

## 2022-02-07 NOTE — ED Triage Notes (Signed)
Patient to ED via [facility] from home unresponsive. Patient "likes to get high" per wife. Patient took multiple pain medications and delta 8 gummies. Patient responsive to painful stimuli.  ?

## 2022-02-08 ENCOUNTER — Ambulatory Visit: Payer: Medicare PPO | Admitting: Physician Assistant

## 2022-02-08 DIAGNOSIS — R739 Hyperglycemia, unspecified: Secondary | ICD-10-CM | POA: Diagnosis present

## 2022-02-08 DIAGNOSIS — R4189 Other symptoms and signs involving cognitive functions and awareness: Secondary | ICD-10-CM | POA: Diagnosis present

## 2022-02-08 DIAGNOSIS — F3181 Bipolar II disorder: Secondary | ICD-10-CM | POA: Diagnosis present

## 2022-02-08 DIAGNOSIS — E663 Overweight: Secondary | ICD-10-CM | POA: Diagnosis present

## 2022-02-08 DIAGNOSIS — G9340 Encephalopathy, unspecified: Secondary | ICD-10-CM | POA: Diagnosis not present

## 2022-02-08 DIAGNOSIS — F1910 Other psychoactive substance abuse, uncomplicated: Secondary | ICD-10-CM | POA: Diagnosis not present

## 2022-02-08 DIAGNOSIS — E119 Type 2 diabetes mellitus without complications: Secondary | ICD-10-CM

## 2022-02-08 DIAGNOSIS — G4700 Insomnia, unspecified: Secondary | ICD-10-CM | POA: Diagnosis present

## 2022-02-08 DIAGNOSIS — F12229 Cannabis dependence with intoxication, unspecified: Secondary | ICD-10-CM | POA: Diagnosis present

## 2022-02-08 DIAGNOSIS — I10 Essential (primary) hypertension: Secondary | ICD-10-CM | POA: Diagnosis present

## 2022-02-08 DIAGNOSIS — F419 Anxiety disorder, unspecified: Secondary | ICD-10-CM

## 2022-02-08 DIAGNOSIS — R45851 Suicidal ideations: Secondary | ICD-10-CM | POA: Diagnosis present

## 2022-02-08 DIAGNOSIS — M1990 Unspecified osteoarthritis, unspecified site: Secondary | ICD-10-CM | POA: Diagnosis present

## 2022-02-08 DIAGNOSIS — Z961 Presence of intraocular lens: Secondary | ICD-10-CM | POA: Diagnosis present

## 2022-02-08 DIAGNOSIS — F1410 Cocaine abuse, uncomplicated: Secondary | ICD-10-CM | POA: Diagnosis present

## 2022-02-08 DIAGNOSIS — Z981 Arthrodesis status: Secondary | ICD-10-CM | POA: Diagnosis not present

## 2022-02-08 DIAGNOSIS — F32A Depression, unspecified: Secondary | ICD-10-CM

## 2022-02-08 DIAGNOSIS — F1220 Cannabis dependence, uncomplicated: Secondary | ICD-10-CM | POA: Diagnosis not present

## 2022-02-08 DIAGNOSIS — E1165 Type 2 diabetes mellitus with hyperglycemia: Secondary | ICD-10-CM | POA: Diagnosis present

## 2022-02-08 DIAGNOSIS — R4587 Impulsiveness: Secondary | ICD-10-CM | POA: Diagnosis present

## 2022-02-08 DIAGNOSIS — K219 Gastro-esophageal reflux disease without esophagitis: Secondary | ICD-10-CM | POA: Diagnosis present

## 2022-02-08 DIAGNOSIS — G8929 Other chronic pain: Secondary | ICD-10-CM | POA: Diagnosis present

## 2022-02-08 DIAGNOSIS — Z9181 History of falling: Secondary | ICD-10-CM | POA: Diagnosis not present

## 2022-02-08 DIAGNOSIS — E785 Hyperlipidemia, unspecified: Secondary | ICD-10-CM | POA: Diagnosis present

## 2022-02-08 DIAGNOSIS — F17210 Nicotine dependence, cigarettes, uncomplicated: Secondary | ICD-10-CM | POA: Diagnosis present

## 2022-02-08 DIAGNOSIS — Z9049 Acquired absence of other specified parts of digestive tract: Secondary | ICD-10-CM | POA: Diagnosis not present

## 2022-02-08 DIAGNOSIS — F1210 Cannabis abuse, uncomplicated: Secondary | ICD-10-CM | POA: Diagnosis present

## 2022-02-08 DIAGNOSIS — G928 Other toxic encephalopathy: Secondary | ICD-10-CM | POA: Diagnosis present

## 2022-02-08 DIAGNOSIS — Z833 Family history of diabetes mellitus: Secondary | ICD-10-CM | POA: Diagnosis not present

## 2022-02-08 DIAGNOSIS — Z8673 Personal history of transient ischemic attack (TIA), and cerebral infarction without residual deficits: Secondary | ICD-10-CM | POA: Diagnosis not present

## 2022-02-08 DIAGNOSIS — E876 Hypokalemia: Secondary | ICD-10-CM | POA: Diagnosis present

## 2022-02-08 LAB — CBC
HCT: 39.4 % (ref 39.0–52.0)
Hemoglobin: 12.8 g/dL — ABNORMAL LOW (ref 13.0–17.0)
MCH: 29.4 pg (ref 26.0–34.0)
MCHC: 32.5 g/dL (ref 30.0–36.0)
MCV: 90.6 fL (ref 80.0–100.0)
Platelets: 222 10*3/uL (ref 150–400)
RBC: 4.35 MIL/uL (ref 4.22–5.81)
RDW: 14.8 % (ref 11.5–15.5)
WBC: 11.1 10*3/uL — ABNORMAL HIGH (ref 4.0–10.5)
nRBC: 0 % (ref 0.0–0.2)

## 2022-02-08 LAB — BASIC METABOLIC PANEL
Anion gap: 11 (ref 5–15)
BUN: 12 mg/dL (ref 8–23)
CO2: 24 mmol/L (ref 22–32)
Calcium: 9 mg/dL (ref 8.9–10.3)
Chloride: 107 mmol/L (ref 98–111)
Creatinine, Ser: 0.79 mg/dL (ref 0.61–1.24)
GFR, Estimated: >60 mL/min (ref >60)
Glucose, Bld: 135 mg/dL — ABNORMAL HIGH (ref 70–99)
Potassium: 3.6 mmol/L (ref 3.5–5.1)
Sodium: 142 mmol/L (ref 135–145)

## 2022-02-08 LAB — HEMOGLOBIN A1C
Hgb A1c MFr Bld: 6.7 % — ABNORMAL HIGH (ref 4.8–5.6)
Mean Plasma Glucose: 145.59 mg/dL

## 2022-02-08 LAB — MAGNESIUM: Magnesium: 2 mg/dL (ref 1.7–2.4)

## 2022-02-08 MED ORDER — TAMSULOSIN HCL 0.4 MG PO CAPS
0.4000 mg | ORAL_CAPSULE | Freq: Every day | ORAL | Status: DC
  Administered 2022-02-08 – 2022-02-14 (×7): 0.4 mg via ORAL
  Filled 2022-02-08 (×7): qty 1

## 2022-02-08 MED ORDER — QUETIAPINE FUMARATE 300 MG PO TABS
300.0000 mg | ORAL_TABLET | Freq: Every day | ORAL | Status: DC
  Administered 2022-02-08 – 2022-02-13 (×6): 300 mg via ORAL
  Filled 2022-02-08 (×7): qty 1

## 2022-02-08 MED ORDER — RISPERIDONE 0.5 MG PO TABS
0.5000 mg | ORAL_TABLET | Freq: Every day | ORAL | Status: DC
Start: 2022-02-08 — End: 2022-02-14
  Administered 2022-02-08 – 2022-02-14 (×7): 0.5 mg via ORAL
  Filled 2022-02-08 (×7): qty 1

## 2022-02-08 MED ORDER — CLOPIDOGREL BISULFATE 75 MG PO TABS
75.0000 mg | ORAL_TABLET | Freq: Every day | ORAL | Status: DC
Start: 2022-02-08 — End: 2022-02-14
  Administered 2022-02-08 – 2022-02-14 (×7): 75 mg via ORAL
  Filled 2022-02-08 (×7): qty 1

## 2022-02-08 MED ORDER — ESCITALOPRAM OXALATE 10 MG PO TABS
10.0000 mg | ORAL_TABLET | Freq: Every day | ORAL | Status: DC
  Administered 2022-02-08 – 2022-02-14 (×7): 10 mg via ORAL
  Filled 2022-02-08 (×9): qty 1

## 2022-02-08 MED ORDER — LORAZEPAM 2 MG/ML IJ SOLN
1.0000 mg | INTRAMUSCULAR | Status: DC | PRN
  Administered 2022-02-08 – 2022-02-11 (×4): 1 mg via INTRAVENOUS
  Filled 2022-02-08 (×4): qty 1

## 2022-02-08 MED ORDER — AMLODIPINE BESYLATE 5 MG PO TABS
2.5000 mg | ORAL_TABLET | Freq: Every day | ORAL | Status: DC
  Administered 2022-02-08 – 2022-02-14 (×7): 2.5 mg via ORAL
  Filled 2022-02-08 (×7): qty 1

## 2022-02-08 MED ORDER — LORAZEPAM 2 MG/ML IJ SOLN
2.0000 mg | Freq: Once | INTRAMUSCULAR | Status: AC
  Administered 2022-02-08: 2 mg via INTRAVENOUS
  Filled 2022-02-08: qty 1

## 2022-02-08 MED ORDER — ENSURE ENLIVE PO LIQD
237.0000 mL | Freq: Two times a day (BID) | ORAL | Status: DC
  Administered 2022-02-09 – 2022-02-11 (×5): 237 mL via ORAL

## 2022-02-08 NOTE — Assessment & Plan Note (Addendum)
-   Continue amlodipine ?

## 2022-02-08 NOTE — TOC Progression Note (Signed)
Transition of Care (TOC) - Progression Note  ? ? ?Patient Details  ?Name: [name] ?MRN: [id number] ?Date of Birth: 1946/08/18 ? ?Transition of Care (TOC) CM/SW Contact  ?Pete Pelt, RN ?Phone Number: ?02/08/2022, 2:04 PM ? ?Clinical Narrative:   Potential discharge tomorrow or Sunday. ? ? ? ?  ?  ? ?Expected Discharge Plan and Services ?  ?  ?  ?  ?  ?                ?  ?  ?  ?  ?  ?  ?  ?  ?  ?  ? ? ?Social Determinants of Health (SDOH) Interventions ?  ? ?Readmission Risk Interventions ?   ? View : No data to display.  ?  ?  ?  ? ? ?

## 2022-02-08 NOTE — Assessment & Plan Note (Addendum)
-  Continue Lipitor °

## 2022-02-08 NOTE — Progress Notes (Signed)
[facility] Progress Note ? ?Patient: [name]    YTK:[id number]  DOA: 02/07/2022    ?Date of Service: the patient was seen and examined on 02/08/2022 ? ?Brief hospital course: ?Patient is a 76 year old male with past medical history of hypertension, depression, anxiety and polysubstance abuse who presented to the emergency room with a decreased level of responsiveness.  According to the patient's ex-wife, she stated that he had been taking a lot of delta 8 THC Gummies would not consistently stay awake.  Blood work and other work-up unremarkable.   ? ?By 4/21 morning, patient somewhat more awake, but quite confused and delirious.  Would not answer questions properly.  Not able to stand without being a fall risk. ? ?Assessment and Plan: ?Assessment and Plan: ?Encephalopathy ?Patient more awake, but still quite delirious.  For now, as needed Ativan and continue IV fluids until drugs have cleared his system.  Urine drug screen pending. ? ?Tetrahydrocannabinol (THC) use disorder, moderate, dependence ([place]) ?- Patient is arousable however counseling not given due to increased somnolence ? ?Polysubstance abuse ([place]) ?- History of heroin, cocaine, marijuana use.  Have ordered urine drug screen. ? ?Hyperglycemia ?Mild.  Checking A1c ? ?Hyperlipidemia ?Statin restarted ? ?Hypokalemia ?Magnesium level stable.  Replacing as needed. ? ?Anxiety and depression ?Have restarted Seroquel and SSRI. ? ?Essential hypertension ?Relatively stable.  Home medications were restarted except for diuretics.  As needed IV antihypertensives if blood pressure stays above 160.  For now. ? ?Overweight (BMI 25.0-29.9) ?Meets criteria BMI greater than 25 ? ?Unresponsive episode ?- Patient's opens eyes briefly and mumbles to sternal and loud verbal stimuli ?- Admit to telemetry medical, observation ?- Anticipate discharge in the a.m. ? ? ? ? ? ? ?Body mass index is 27.18 kg/m?.  ?  ?    ? ?Consultants: ?None ? ?Procedures: ?None ? ?Antimicrobials: ?None ? ?Code Status: Full code ? ? ?Subjective: Does not really respond to my questions ? ?Objective: ?Vital signs were reviewed and unremarkable. ?Vitals:  ? 02/08/22 0047 02/08/22 0426  ?BP: (!) 118/48 (!) 145/69  ?Pulse: 86 84  ?Resp: 18 18  ?Temp: 97.8 ?F (36.6 ?C) 97.7 ?F (36.5 ?C)  ?SpO2: 98% 100%  ? ? ?Intake/Output Summary (Last 24 hours) at 02/08/2022 1351 ?Last data filed at 02/07/2022 1621 ?Gross per 24 hour  ?Intake 999 ml  ?Output --  ?Net 999 ml  ? ?Filed Weights  ? 02/07/22 1514 02/07/22 2229  ?Weight: 66 kg 67.4 kg  ? ?Body mass index is 27.18 kg/m?. ? ?Exam: ? ?General: Somewhat somnolent, awakens and mumbles  ?HEENT: Normocephalic, atraumatic, mucous membranes are dry ?Cardiovascular: Regular rate and rhythm, S1-S2 ?Respiratory: Clear to auscultation bilaterally ?Abdomen: Soft, nontender, nondistended, positive bowel sounds ?Musculoskeletal: No clubbing or cyanosis or edema ?Skin: No skin breaks, tears or lesions ?Psychiatry: Patient acutely delirious ?Neurology: No focal deficits ? ?Data Reviewed: ?Labs today reviewed, mild hyperglycemia ? ?Disposition:  ?Status is: Inpatient ?Remains inpatient appropriate because: Continued delirium ?  ? ?Anticipated discharge date: 4/22 ? ?Remaining issues to be resolved so that patient can be discharged: Clearance of his delirium ? ? ?Family Communication: Left message for ex-wife ?DVT Prophylaxis: ?enoxaparin (LOVENOX) injection 40 mg Start: 02/07/22 2200 ?Place TED hose Start: 02/07/22 2043 ? ? ? ?Author: ?Annita Brod ,MD ?02/08/2022 1:51 PM ? ?To reach On-call, see care teams to locate the attending and reach out via [email or web address]. ?Between 7PM-7AM, please contact night-coverage ?If you still have difficulty reaching the attending provider, please page  the [facility] (Director on Call) for [facility] on amion for assistance. ? ?

## 2022-02-08 NOTE — Progress Notes (Signed)
Cross Cover ?Safety sitter ordered for impulsiveness and confusion. Had fall while in ED.  ?

## 2022-02-08 NOTE — Progress Notes (Addendum)
Patient alert and oriented x 2, no complaints of pain. Patient very impulsive, did not want to stay in bed, wanted to get up. Incomprehensible speech at time, VSS, will continue to monitor.  ?

## 2022-02-08 NOTE — Assessment & Plan Note (Addendum)
Hb A1c 6.7 .  Diet controlled now. ?Diabetic education discussed. ?

## 2022-02-08 NOTE — Assessment & Plan Note (Addendum)
Diet and exercise discussed in detail. ?

## 2022-02-08 NOTE — Assessment & Plan Note (Addendum)
Continue Seroquel and SSRI.  Continue daily low-dose Ativan. ?

## 2022-02-08 NOTE — Assessment & Plan Note (Addendum)
Drug use is finally cleared patient's system.  He is now fully awake, no longer delirious and alert and oriented x 3, answering questions appropriately, aware who the president is.  Urine drug screen noted to be positive for benzos, THC and tricyclic antidepressants.  Benzos are probably due to the Ativan, he was given for agitation.  ?

## 2022-02-09 DIAGNOSIS — F12129 Cannabis abuse with intoxication, unspecified: Secondary | ICD-10-CM

## 2022-02-09 DIAGNOSIS — I10 Essential (primary) hypertension: Secondary | ICD-10-CM

## 2022-02-09 DIAGNOSIS — G9340 Encephalopathy, unspecified: Secondary | ICD-10-CM | POA: Diagnosis not present

## 2022-02-09 DIAGNOSIS — F419 Anxiety disorder, unspecified: Secondary | ICD-10-CM | POA: Diagnosis not present

## 2022-02-09 LAB — URINE DRUG SCREEN, QUALITATIVE (ARMC ONLY)
Amphetamines, Ur Screen: NOT DETECTED
Barbiturates, Ur Screen: NOT DETECTED
Benzodiazepine, Ur Scrn: POSITIVE — AB
Cannabinoid 50 Ng, Ur ~~LOC~~: POSITIVE — AB
Cocaine Metabolite,Ur ~~LOC~~: NOT DETECTED
MDMA (Ecstasy)Ur Screen: NOT DETECTED
Methadone Scn, Ur: NOT DETECTED
Opiate, Ur Screen: NOT DETECTED
Phencyclidine (PCP) Ur S: NOT DETECTED
Tricyclic, Ur Screen: POSITIVE — AB

## 2022-02-09 LAB — CBC
HCT: 38.9 % — ABNORMAL LOW (ref 39.0–52.0)
Hemoglobin: 12.8 g/dL — ABNORMAL LOW (ref 13.0–17.0)
MCH: 30 pg (ref 26.0–34.0)
MCHC: 32.9 g/dL (ref 30.0–36.0)
MCV: 91.1 fL (ref 80.0–100.0)
Platelets: 194 10*3/uL (ref 150–400)
RBC: 4.27 MIL/uL (ref 4.22–5.81)
RDW: 15.1 % (ref 11.5–15.5)
WBC: 12.1 10*3/uL — ABNORMAL HIGH (ref 4.0–10.5)
nRBC: 0 % (ref 0.0–0.2)

## 2022-02-09 LAB — PROCALCITONIN: Procalcitonin: <0.1 ng/mL

## 2022-02-09 MED ORDER — NICOTINE 21 MG/24HR TD PT24: 21.0000 mg | MEDICATED_PATCH | TRANSDERMAL | 0 refills | Status: DC

## 2022-02-09 MED ORDER — MELATONIN 5 MG PO TABS
5.0000 mg | ORAL_TABLET | Freq: Every day | ORAL | Status: DC
Start: 2022-02-09 — End: 2022-06-12

## 2022-02-09 NOTE — Discharge Summary (Addendum)
Addendum: Patient's family refuses to take him home if he does not go to drug rehab.  Patient was here recently for the same issue and was discharged to a drug rehab facility and family assumed that he would get discharged to another rehab.  When this was discussed with the patient, patient states that he is willing to go to the facility.  Therefore, discharge will be canceled and TOC can work to finding a rehab facility, likely on Monday, 4/24. ? ?Physician Discharge Summary ?  ?Patient: [name] MRN: [id number] DOB: Jul 26, 1946  ?Admit date:     02/07/2022  ?Discharge date: 02/09/22  ?Discharge Physician: Annita Brod  ? ?PCP: Tracie Harrier, MD  ? ?Recommendations at discharge:  ? ?Patient given information for drug counseling ? ?Discharge Diagnoses: ?Active Problems: ?  Encephalopathy ?  Tetrahydrocannabinol (THC) use disorder, moderate, dependence ([place]) ?  Polysubstance abuse ([place]) ?  Hyperglycemia ?  Hyperlipidemia ?  Hypokalemia ?  Anxiety and depression ?  Essential hypertension ?  Overweight (BMI 25.0-29.9) ? ?Resolved Problems: ?  * No resolved hospital problems. * ? ?Hospital Course: ?Patient is a 76 year old male with past medical history of hypertension, depression, anxiety and polysubstance abuse who presented to the emergency room with a decreased level of responsiveness.  According to the patient's ex-wife, she stated that he had been taking a lot of delta 8 THC Gummies would not consistently stay awake.  Blood work and other work-up unremarkable.   ? ?By 4/21 morning, patient somewhat more awake, but quite confused and delirious.  Would not answer questions properly.  Not able to stand without being a fall risk. ? ?By 4/22, patient fully awake and alert x3.  Able to ambulate without difficulty.  He is fully aware of his surroundings and able to answer questions appropriately.  Felt to medically stable for discharge.  His labs on 4/22 were unremarkable. ? ?Assessment and  Plan: ?Encephalopathy ?Drug use is finally cleared patient's system.  He is now fully awake, no longer delirious and alert and oriented x3, answering questions appropriately, aware who the president is.  Urine drug screen noted to be positive for benzos, THC and tricyclic antidepressants.  Benzos are probably due to the Ativan he was given for agitation.  ? ?Tetrahydrocannabinol (THC) use disorder, moderate, dependence ([place]) ?Advised to quit illegal drugs. ? ?Polysubstance abuse ([place]) ?- History of heroin, cocaine, marijuana use.  Urine drug screen notes THC and tricyclic's.  Patient states he took too many [place]. ? ?Hyperglycemia ?Mild.  Checking A1c ? ?Hyperlipidemia ?Statin restarted ? ?Hypokalemia ?Magnesium level stable.  Replacing as needed. ? ?Anxiety and depression ?Have restarted Seroquel and SSRI. ? ?Essential hypertension ?Relatively stable.  Home medications were restarted except for diuretics.  As needed IV antihypertensives if blood pressure stays above 160.  For now. ? ?Overweight (BMI 25.0-29.9) ?Meets criteria BMI greater than 25 ? ?Unresponsive episode ?- Patient's opens eyes briefly and mumbles to sternal and loud verbal stimuli ?- Admit to telemetry medical, observation ?- Anticipate discharge in the a.m. ? ? ? ? ?  ? ? ?Consultants: None ?Procedures performed: None ?Disposition: Home ?Diet recommendation:  ?Discharge Diet Orders (From admission, onward)  ? ?  Start     Ordered  ? 02/09/22 0000  Diet - low sodium heart healthy       ? 02/09/22 1507  ? ?  ?  ? ?  ? ?Cardiac diet ?DISCHARGE MEDICATION: ?Allergies as of 02/09/2022   ?No Known Allergies ?  ? ?  ?  Medication List  ?  ? ?TAKE these medications   ? ?acetaminophen 325 MG tablet ?Commonly known as: TYLENOL ?Take 2 tablets (650 mg total) by mouth every 6 (six) hours as needed for mild pain (or Fever >/= 101). ?  ?amLODipine 2.5 MG tablet ?Commonly known as: NORVASC ?Take 2.5 mg by mouth daily. ?  ?aspirin 81 MG EC tablet ?Take 1 tablet  (81 mg total) by mouth daily. Swallow whole. ?  ?atorvastatin 40 MG tablet ?Commonly known as: LIPITOR ?Take 40 mg by mouth daily. ?  ?baclofen 10 MG tablet ?Commonly known as: LIORESAL ?Take 10 mg by mouth 3 (three) times daily. ?  ?clopidogrel 75 MG tablet ?Commonly known as: PLAVIX ?Take 75 mg by mouth daily. ?  ?CVS D3 25 MCG (1000 UT) capsule ?Generic drug: Cholecalciferol ?Take 2,000 Units by mouth daily. ?  ?escitalopram 10 MG tablet ?Commonly known as: LEXAPRO ?Take 10 mg by mouth daily. ?  ?feeding supplement Liqd ?Take 237 mLs by mouth 2 (two) times daily between meals. ?  ?folic acid 1 MG tablet ?Commonly known as: FOLVITE ?Take 1 tablet (1 mg total) by mouth daily. ?  ?furosemide 20 MG tablet ?Commonly known as: LASIX ?Take 20 mg by mouth daily. ?  ?hydrochlorothiazide 12.5 MG tablet ?Commonly known as: HYDRODIURIL ?Take 12.5 mg by mouth daily. ?  ?ibuprofen 800 MG tablet ?Commonly known as: ADVIL ?Take 1 tablet (800 mg total) by mouth every 8 (eight) hours as needed. ?  ?LORazepam 0.5 MG tablet ?Commonly known as: ATIVAN ?Take 0.5 tablets (0.25 mg total) by mouth daily. ?  ?meclizine 25 MG tablet ?Commonly known as: ANTIVERT ?Take 25 mg by mouth 3 (three) times daily as needed for dizziness. ?  ?melatonin 5 MG Tabs ?Take 1 tablet (5 mg total) by mouth at bedtime. ?  ?nicotine 21 mg/24hr patch ?Commonly known as: NICODERM CQ - dosed in mg/24 hours ?Place 1 patch (21 mg total) onto the skin daily. ?  ?QUEtiapine 300 MG tablet ?Commonly known as: SEROQUEL ?Take 300 mg by mouth at bedtime. ?  ?risperiDONE 0.5 MG tablet ?Commonly known as: RISPERDAL ?Take 0.5 mg by mouth daily. ?  ?tamsulosin 0.4 MG Caps capsule ?Commonly known as: FLOMAX ?Take 0.4 mg by mouth daily. ?  ?traZODone 50 MG tablet ?Commonly known as: DESYREL ?Take 50 mg by mouth at bedtime. ?  ? ?  ? ? ?Discharge Exam: ?Filed Weights  ? 02/07/22 1514 02/07/22 2229  ?Weight: 66 kg 67.4 kg  ? ?General: Alert and oriented x4, no acute  distress ?Cardiovascular: Regular rate and rhythm, S1-S2 ?Lungs: Clear auscultation bilaterally ? ?Condition at discharge: good ? ?The results of significant diagnostics from this hospitalization (including imaging, microbiology, ancillary and laboratory) are listed below for reference.  ? ?Imaging Studies: ?DG Tibia/Fibula Left ? ?Result Date: 01/16/2022 ?CLINICAL DATA:  wound, drainage EXAM: LEFT TIBIA AND FIBULA - 2 VIEW COMPARISON:  None. FINDINGS: No cortical erosion or destruction. There is no evidence of fracture or other focal bone lesions. Anterior proximal leg dermal/soft tissue defect. IMPRESSION: 1. No radiographic findings to suggest osteomyelitis. 2.  No acute displaced fracture or dislocation. Electronically Signed   By: Iven Finn M.D.   On: 01/16/2022 18:58  ? ?CT HEAD WO CONTRAST (5MM) ? ?Result Date: 02/07/2022 ?CLINICAL DATA:  Unresponsive. EXAM: CT HEAD WITHOUT CONTRAST TECHNIQUE: Contiguous axial images were obtained from the base of the skull through the vertex without intravenous contrast. RADIATION DOSE REDUCTION: This exam was performed according to  the departmental dose-optimization program which includes automated exposure control, adjustment of the mA and/or kV according to patient size and/or use of iterative reconstruction technique. COMPARISON:  MRI brain dated October 11, 2020. CT head dated October 10, 2020. FINDINGS: Brain: No evidence of acute infarction, hemorrhage, hydrocephalus, extra-axial collection or mass lesion/mass effect. 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 Stable mild atrophy. Vascular: Atherosclerotic vascular calcification of the carotid siphons. No hyperdense vessel. Skull: Normal. Negative for fracture or focal lesion. Sinuses/Orbits: No acute finding. Other: None. IMPRESSION: 1. No acute intracranial abnormality. 2. Old right occipital lobe infarct. Electronically Signed   By: Titus Dubin M.D.   On: 02/07/2022 16:02  ? ?DG Chest Portable 1  View ? ?Result Date: 02/07/2022 ?CLINICAL DATA:  Unresponsive EXAM: PORTABLE CHEST 1 VIEW COMPARISON:  10/09/2020 FINDINGS: Single frontal view of the chest demonstrates an unremarkable cardiac silhouette. Mild diffuse interstitial promine

## 2022-02-10 DIAGNOSIS — F1910 Other psychoactive substance abuse, uncomplicated: Secondary | ICD-10-CM | POA: Diagnosis not present

## 2022-02-10 DIAGNOSIS — E119 Type 2 diabetes mellitus without complications: Secondary | ICD-10-CM | POA: Diagnosis not present

## 2022-02-10 DIAGNOSIS — S81802A Unspecified open wound, left lower leg, initial encounter: Secondary | ICD-10-CM | POA: Diagnosis not present

## 2022-02-10 DIAGNOSIS — E663 Overweight: Secondary | ICD-10-CM | POA: Diagnosis not present

## 2022-02-10 MED ORDER — LIDOCAINE 5 % EX PTCH
1.0000 | MEDICATED_PATCH | CUTANEOUS | Status: DC
  Administered 2022-02-10 – 2022-02-12 (×2): 1 via TRANSDERMAL
  Filled 2022-02-10 (×5): qty 1

## 2022-02-10 MED ORDER — TRAMADOL HCL 50 MG PO TABS
50.0000 mg | ORAL_TABLET | Freq: Four times a day (QID) | ORAL | Status: DC | PRN
  Administered 2022-02-10 – 2022-02-13 (×5): 50 mg via ORAL
  Filled 2022-02-10 (×4): qty 1

## 2022-02-10 NOTE — Progress Notes (Addendum)
[facility] Progress Note ? ?Patient: [name]    NOM:[id number]  DOA: 02/07/2022    ?Date of Service: the patient was seen and examined on 02/10/2022 ? ?Brief hospital course: ?Patient is a 76 year old male with past medical history of hypertension, depression, anxiety and polysubstance abuse who presented to the emergency room with a decreased level of responsiveness.  According to the patient's ex-wife, she stated that he had been taking a lot of delta 8 THC Gummies would not consistently stay awake.  Blood work and other work-up unremarkable.   ? ?By 4/21 morning, patient somewhat more awake, but quite confused and delirious.  Would not answer questions properly.  Not able to stand without being a fall risk. ? ?By 4/22, patient fully awake and alert x3.  Able to ambulate without difficulty.  He is fully aware of his surroundings and able to answer questions appropriately.  Felt to medically stable for discharge.  However, family stated that they would not take him home unless he goes to drug rehab, which the patient is willing to do. ? ?Assessment and Plan: ?Assessment and Plan: ?Leg wound, left ?Patient fell a few months ago and since then he states the wound on the anterior aspect of his front leg has not healed very much.  According to nursing had minimal scabbing over.  Wound care consult ? ? ?Polysubstance abuse ([place]) ?- History of heroin, cocaine, marijuana use.  Urine drug screen notes THC and tricyclic's.  Patient states he took too many [place].  Once he woke up, family refused to take him home saying that he needed to go to drug rehab as he has done this often.  Patient amenable to this. TOC looking for available facilities ? ?Tetrahydrocannabinol (THC) use disorder, moderate, dependence ([place]) ?Advised to quit illegal drugs. ? ?Encephalopathy-resolved as of 02/10/2022 ?Drug use is finally cleared patient's system.  He is now fully awake, no longer delirious and alert and oriented x3,  answering questions appropriately, aware who the president is.  Urine drug screen noted to be positive for benzos, THC and tricyclic antidepressants.  Benzos are probably due to the Ativan he was given for agitation.  ? ?Diabetes mellitus type 2, diet-controlled ([place]) ?Noted to have some mild hyperglycemia.  A1c at 6.7.  Will give patient education.  Diet controlled for now. ? ?Hyperlipidemia ?Statin restarted ? ?Hypokalemia ?Magnesium level stable.  Replacing as needed. ? ?Anxiety and depression ?Have restarted Seroquel and SSRI. ? ?Essential hypertension ?Relatively stable.  Home medications were restarted except for diuretics.  As needed IV antihypertensives if blood pressure stays above 160.  For now. ? ?Chronic low back pain (Location of Primary Source of Pain) ([place]) ?Avoid narcotics.  We will try lidocaine patch ? ?Overweight (BMI 25.0-29.9) ?Meets criteria BMI greater than 25 ? ?Unresponsive episode ?- Patient's opens eyes briefly and mumbles to sternal and loud verbal stimuli ?- Admit to telemetry medical, observation ?- Anticipate discharge in the a.m. ? ? ? ? ? ? ?Body mass index is 27.18 kg/m?.  ?  ?   ? ?Consultants: ?None ? ?Procedures: ?None ? ?Antimicrobials: ?None ? ?Code Status: Full code ? ? ?Subjective: Doing okay, complains of some chronic lower back pain ? ?Objective: ?Vital signs were reviewed and unremarkable. ?Vitals:  ? 02/10/22 0451 02/10/22 0700  ?BP: 136/74 116/83  ?Pulse: 92 94  ?Resp: 19 18  ?Temp: 98.1 ?F (36.7 ?C) 98.8 ?F (37.1 ?C)  ?SpO2:    ? ? ?Intake/Output Summary (Last 24  hours) at 02/10/2022 1310 ?Last data filed at 02/10/2022 0900 ?Gross per 24 hour  ?Intake 600 ml  ?Output --  ?Net 600 ml  ? ?Filed Weights  ? 02/07/22 1514 02/07/22 2229  ?Weight: 66 kg 67.4 kg  ? ?Body mass index is 27.18 kg/m?. ? ?Exam: ? ?General: Alert and oriented x3, no acute distress ?HEENT: Normocephalic, atraumatic, mucous membranes are dry ?Cardiovascular: Regular rate and rhythm,  S1-S2 ?Respiratory: Clear to auscultation bilaterally ?Abdomen: Soft, nontender, nondistended, positive bowel sounds ?Musculoskeletal: No clubbing or cyanosis or edema ?Skin: Patient has a 1.5 cm open wound on the anterior aspect of his left leg below his knee ?Psychiatry: Appropriate, no evidence of psychoses ?Neurology: No focal deficits ? ?Data Reviewed: ?Noted A1c of 6.7 ? ?Disposition:  ?Status is: Inpatient ?Remains inpatient appropriate because: Drug rehab placement ?  ? ?Anticipated discharge date: 4/ 23 ? ?Remaining issues to be resolved so that patient can be discharged: Drug rehab placement ? ? ?Family Communication: Left message for ex-wife ?DVT Prophylaxis: ?enoxaparin (LOVENOX) injection 40 mg Start: 02/07/22 2200 ?Place TED hose Start: 02/07/22 2043 ? ? ? ?Author: ?Annita Brod ,MD ?02/10/2022 1:10 PM ? ?To reach On-call, see care teams to locate the attending and reach out via [email or web address]. ?Between 7PM-7AM, please contact night-coverage ?If you still have difficulty reaching the attending provider, please page the [facility] (Director on Call) for [facility] on amion for assistance. ? ?

## 2022-02-10 NOTE — Assessment & Plan Note (Addendum)
Avoid narcotics.  Continue lidocaine patch. ?

## 2022-02-10 NOTE — Assessment & Plan Note (Addendum)
Patient fell few months back and since then he has wound on the anterior aspect of his left leg,  has not healed well..  According to nursing had minimal scabbing over.  He was seen by wound care who recommended daily Xeroform dressing changes covered by dry gauze and secured either with wrap or foam. ? ?

## 2022-02-10 NOTE — Progress Notes (Signed)
RN notified that patient was suspected to be smoking in bathroom. RN arrived to room as patient was washing hands. Room and bathroom smells strongly of smoke. Patient did not deny smoking in bathroom but also stated it was his wife that had smoked in bathroom before she left. RN questioned further and patient stated he was sorry and it wouldn't happen again and "please don't report me". RN educated that MD must be aware as well as safety hazard of smoking in hospital with flammable medical equipment and medications. MD made aware of situation. Will continue to monitor. ?

## 2022-02-10 NOTE — Consult Note (Signed)
[facility] Nurse Consult Note: ?Reason for Consult:Chronic, nonhealing wound on left pretibial area ?Wound type:trauma (from fall, patient reports) ?Pressure Injury POA: N/A ?Measurement:4cm x 2cm x 0.2cm ?Wound bed:red, dry ?Drainage (amount, consistency, odor) none ?Periwound:intact, dry ?Dressing procedure/placement/frequency: I will provide Nursing with conservative care guidance for topical care of this wound using a soap and water cleanse, rinse and pat dry followed by placement of an antimicrobial nonadherent (xeroform) topped with dry gauze and secured with a silicone foam. The xeroform is to be changed daily, the silicone foam can be reused for up to 3 days. ? ?[place] nursing team will not follow, but will remain available to this patient, the nursing and medical teams.  Please re-consult if needed. ?Thanks, ?Maudie Flakes, MSN, RN, [place], [place], CWON-AP, [place]  ?Pager# [phone]  ? ? ?  ?

## 2022-02-11 DIAGNOSIS — S81802A Unspecified open wound, left lower leg, initial encounter: Secondary | ICD-10-CM | POA: Diagnosis not present

## 2022-02-11 DIAGNOSIS — F419 Anxiety disorder, unspecified: Secondary | ICD-10-CM | POA: Diagnosis not present

## 2022-02-11 DIAGNOSIS — F12129 Cannabis abuse with intoxication, unspecified: Secondary | ICD-10-CM

## 2022-02-11 DIAGNOSIS — E663 Overweight: Secondary | ICD-10-CM | POA: Diagnosis not present

## 2022-02-11 MED ORDER — LORAZEPAM 0.5 MG PO TABS: 0.2500 mg | ORAL_TABLET | Freq: Every day | ORAL | 0 refills | Status: DC

## 2022-02-11 MED ORDER — LORAZEPAM 0.5 MG PO TABS
0.2500 mg | ORAL_TABLET | Freq: Every day | ORAL | Status: DC
  Administered 2022-02-12 – 2022-02-14 (×3): 0.25 mg via ORAL
  Filled 2022-02-11 (×3): qty 1

## 2022-02-11 MED ORDER — ALPRAZOLAM 0.5 MG PO TABS
0.5000 mg | ORAL_TABLET | Freq: Three times a day (TID) | ORAL | Status: DC | PRN
  Administered 2022-02-11 – 2022-02-13 (×3): 0.5 mg via ORAL
  Filled 2022-02-11 (×4): qty 1

## 2022-02-11 NOTE — Discharge Summary (Signed)
?Physician Discharge Summary ?  ?Patient: [name] MRN: [id number] DOB: 10/12/1946  ?Admit date:     02/07/2022  ?Discharge date: 02/11/22  ?Discharge Physician: Annita Brod  ? ?PCP: Tracie Harrier, MD  ? ?Recommendations at discharge:  ? ?Patient being discharged to drug rehab ?Left leg wound care: Change Xeroform daily and then cover with dry gauze and wrap. ? ?Discharge Diagnoses: ?Active Problems: ?  Polysubstance abuse ([place]) ?  Leg wound, left ?  Tetrahydrocannabinol (THC) use disorder, moderate, dependence ([place]) ?  Diabetes mellitus type 2, diet-controlled ([place]) ?  Hyperlipidemia ?  Hypokalemia ?  Anxiety and depression ?  Essential hypertension ?  Chronic low back pain (Location of Primary Source of Pain) ([place]) ?  Overweight (BMI 25.0-29.9) ? ?Resolved Problems: ?  Encephalopathy ? ?Hospital Course: ?Patient is a 76 year old male with past medical history of hypertension, depression, anxiety and polysubstance abuse who presented to the emergency room with a decreased level of responsiveness.  According to the patient's ex-wife, she stated that he had been taking a lot of delta 8 THC Gummies would not consistently stay awake.  Blood work and other work-up unremarkable.   ? ?By 4/21 morning, patient somewhat more awake, but quite confused and delirious.  Would not answer questions properly.  Not able to stand without being a fall risk. ? ?By 4/22, patient fully awake and alert x3.  Able to ambulate without difficulty.  He is fully aware of his surroundings and able to answer questions appropriately.  Felt to medically stable for discharge.  However, family stated that they would not take him home unless he goes to drug rehab, which the patient is willing to do. ? ?Assessment and Plan: ?Polysubstance abuse ([place]) ?- History of heroin, cocaine, marijuana use.  Urine drug screen notes THC and tricyclic's.  Patient states he took too many [place].  Once he woke up, family refused to take him home  saying that he needed to go to drug rehab as he has done this often.  Patient amenable to this. TOC looking for available facilities ? ?Leg wound, left ?Patient fell a few months ago and since then he states the wound on the anterior aspect of his front leg has not healed very much.  According to nursing had minimal scabbing over.  Seen by wound care who recommended daily Xeroform dressing changes covered by dry gauze and secured either with wrap or foam. ? ? ?Tetrahydrocannabinol (THC) use disorder, moderate, dependence ([place]) ?Advised to quit illegal drugs. ? ?Encephalopathy-resolved as of 02/10/2022 ?Drug use is finally cleared patient's system.  He is now fully awake, no longer delirious and alert and oriented x3, answering questions appropriately, aware who the president is.  Urine drug screen noted to be positive for benzos, THC and tricyclic antidepressants.  Benzos are probably due to the Ativan he was given for agitation.  ? ?Diabetes mellitus type 2, diet-controlled ([place]) ?Noted to have some mild hyperglycemia.  A1c at 6.7.  Will give patient education.  Diet controlled for now. ? ?Hyperlipidemia ?Statin restarted ? ?Hypokalemia ?Magnesium level stable.  Replacing as needed. ? ?Anxiety and depression ?Have restarted Seroquel and SSRI.  Continue daily low-dose Ativan. ? ?Essential hypertension ?Relatively stable.  Home medications were restarted except for diuretics.  As needed IV antihypertensives if blood pressure stays above 160.  For now. ? ?Chronic low back pain (Location of Primary Source of Pain) ([place]) ?Avoid narcotics.  We will try lidocaine patch ? ?Overweight (BMI 25.0-29.9) ?Meets  criteria BMI greater than 25 ? ?Unresponsive episode ?- Patient's opens eyes briefly and mumbles to sternal and loud verbal stimuli ?- Admit to telemetry medical, observation ?- Anticipate discharge in the a.m. ? ? ? ? ?  ? ? ?Consultants: None ?Procedures performed: None ?Disposition: Drug rehabilitation  facility ?Diet recommendation:  ?Discharge Diet Orders (From admission, onward)  ? ?  Start     Ordered  ? 02/09/22 0000  Diet - low sodium heart healthy       ? 02/09/22 1507  ? ?  ?  ? ?  ? ?Cardiac diet ?DISCHARGE MEDICATION: ?Allergies as of 02/11/2022   ?No Known Allergies ?  ? ?  ?Medication List  ?  ? ?TAKE these medications   ? ?acetaminophen 325 MG tablet ?Commonly known as: TYLENOL ?Take 2 tablets (650 mg total) by mouth every 6 (six) hours as needed for mild pain (or Fever >/= 101). ?  ?amLODipine 2.5 MG tablet ?Commonly known as: NORVASC ?Take 2.5 mg by mouth daily. ?  ?aspirin 81 MG EC tablet ?Take 1 tablet (81 mg total) by mouth daily. Swallow whole. ?  ?atorvastatin 40 MG tablet ?Commonly known as: LIPITOR ?Take 40 mg by mouth daily. ?  ?baclofen 10 MG tablet ?Commonly known as: LIORESAL ?Take 10 mg by mouth 3 (three) times daily. ?  ?clopidogrel 75 MG tablet ?Commonly known as: PLAVIX ?Take 75 mg by mouth daily. ?  ?[employer] D3 25 MCG (1000 UT) capsule ?Generic drug: Cholecalciferol ?Take 2,000 Units by mouth daily. ?  ?escitalopram 10 MG tablet ?Commonly known as: LEXAPRO ?Take 10 mg by mouth daily. ?  ?feeding supplement Liqd ?Take 237 mLs by mouth 2 (two) times daily between meals. ?  ?folic acid 1 MG tablet ?Commonly known as: FOLVITE ?Take 1 tablet (1 mg total) by mouth daily. ?  ?furosemide 20 MG tablet ?Commonly known as: LASIX ?Take 20 mg by mouth daily. ?  ?hydrochlorothiazide 12.5 MG tablet ?Commonly known as: HYDRODIURIL ?Take 12.5 mg by mouth daily. ?  ?ibuprofen 800 MG tablet ?Commonly known as: ADVIL ?Take 1 tablet (800 mg total) by mouth every 8 (eight) hours as needed. ?  ?LORazepam 0.5 MG tablet ?Commonly known as: ATIVAN ?Take 0.5 tablets (0.25 mg total) by mouth daily. ?  ?meclizine 25 MG tablet ?Commonly known as: ANTIVERT ?Take 25 mg by mouth 3 (three) times daily as needed for dizziness. ?  ?melatonin 5 MG Tabs ?Take 1 tablet (5 mg total) by mouth at bedtime. ?  ?nicotine 21 mg/24hr  patch ?Commonly known as: NICODERM CQ - dosed in mg/24 hours ?Place 1 patch (21 mg total) onto the skin daily. ?  ?QUEtiapine 300 MG tablet ?Commonly known as: SEROQUEL ?Take 300 mg by mouth at bedtime. ?  ?risperiDONE 0.5 MG tablet ?Commonly known as: RISPERDAL ?Take 0.5 mg by mouth daily. ?  ?tamsulosin 0.4 MG Caps capsule ?Commonly known as: FLOMAX ?Take 0.4 mg by mouth daily. ?  ?traZODone 50 MG tablet ?Commonly known as: DESYREL ?Take 50 mg by mouth at bedtime. ?  ? ?  ? ? ?Discharge Exam: ?Filed Weights  ? 02/07/22 1514 02/07/22 2229  ?Weight: 66 kg 67.4 kg  ? ?General: Alert and oriented x4, no acute distress ?Cardiovascular: Regular rate and rhythm, S1-S2 ?Lungs: Clear auscultation bilaterally ? ?Condition at discharge: good ? ?The results of significant diagnostics from this hospitalization (including imaging, microbiology, ancillary and laboratory) are listed below for reference.  ? ?Imaging Studies: ?DG Tibia/Fibula Left ? ?Result Date:  01/16/2022 ?CLINICAL DATA:  wound, drainage EXAM: LEFT TIBIA AND FIBULA - 2 VIEW COMPARISON:  None. FINDINGS: No cortical erosion or destruction. There is no evidence of fracture or other focal bone lesions. Anterior proximal leg dermal/soft tissue defect. IMPRESSION: 1. No radiographic findings to suggest osteomyelitis. 2.  No acute displaced fracture or dislocation. Electronically Signed   By: Iven Finn M.D.   On: 01/16/2022 18:58  ? ?CT HEAD WO CONTRAST (5MM) ? ?Result Date: 02/07/2022 ?CLINICAL DATA:  Unresponsive. EXAM: CT HEAD WITHOUT CONTRAST TECHNIQUE: Contiguous axial images were obtained from the base of the skull through the vertex without intravenous contrast. RADIATION DOSE REDUCTION: This exam was performed according to the departmental dose-optimization program which includes automated exposure control, adjustment of the mA and/or kV according to patient size and/or use of iterative reconstruction technique. COMPARISON:  MRI brain dated October 11, 2020. CT head dated October 10, 2020. FINDINGS: Brain: No evidence of acute infarction, hemorrhage, hydrocephalus, extra-axial collection or mass lesion/mass effect. 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 Sta

## 2022-02-11 NOTE — Progress Notes (Signed)
Per patient he said it ok for MD to speak with his daughter Zakai Gonyea.  ?

## 2022-02-11 NOTE — Care Management Important Message (Signed)
Important Message ? ?Patient Details  ?Name: [name] ?MRN: [id number] ?Date of Birth: 07-03-1946 ? ? ?Medicare Important Message Given:  Yes ? ? ? ? ?Juliann Pulse A Bradyn Soward ?02/11/2022, 12:17 PM ?

## 2022-02-11 NOTE — TOC Progression Note (Signed)
Transition of Care (TOC) - Progression Note  ? ? ?Patient Details  ?Name: [name] ?MRN: [id number] ?Date of Birth: 06-26-1946 ? ?Transition of Care (TOC) CM/SW Contact  ?Pete Pelt, RN ?Phone Number: ?02/11/2022, 2:46 PM ? ?Clinical Narrative:  Anticipated discharge today.  MD verifies that patient will not require wound care.  Substance abuse resources given to patient with discharge information.   ? ? ? ?  ?  ? ?Expected Discharge Plan and Services ?  ?  ?  ?  ?  ?Expected Discharge Date: 02/09/22               ?  ?  ?  ?  ?  ?  ?  ?  ?  ?  ? ? ?Social Determinants of Health (SDOH) Interventions ?  ? ?Readmission Risk Interventions ?   ? View : No data to display.  ?  ?  ?  ? ? ?

## 2022-02-11 NOTE — Significant Event (Signed)
? ?      CROSS COVER NOTE ? ?NAME: [name] ?MRN: [id number] ?DOB : 02/11/1946 ? ?Sign out received from Dr Maryland Pink that family reports [name] sent text messages to them stating he wanted to end it all and that patient may need to be involuntarily committed. On my assessment [name] acknowledges that he has had intentional overdoses in the past where his intention was self harm. When asked about the overdose that brought him into the hospital on 02/07/2022 [name] is evasive and when asked if he intended to overdose this time and if his intention was to harm himself he states "I made a mistake". When asked the same question again he states "I don't know". [name] has a history of bipolar depression and polysubstance abuse, based on my assessment tonight and collateral information from family communicated to Dr Maryland Pink I am concerned that Mr Corpus is at increased risk of self harm if discharged from the hospital. IVC paperwork completed for 3 days and faxed to [facility]. ? ? ?Neomia Glass MHA, MSN, FNP-BC ?Nurse Practitioner ?[facility] ?[place] ?Pager [phone] ? ?

## 2022-02-11 NOTE — Progress Notes (Signed)
Patient is refusing bed and chair alarms, Charge nurse aware.   ?

## 2022-02-12 DIAGNOSIS — G9340 Encephalopathy, unspecified: Secondary | ICD-10-CM

## 2022-02-12 DIAGNOSIS — F1220 Cannabis dependence, uncomplicated: Secondary | ICD-10-CM | POA: Diagnosis not present

## 2022-02-12 DIAGNOSIS — R45851 Suicidal ideations: Secondary | ICD-10-CM

## 2022-02-12 DIAGNOSIS — F1910 Other psychoactive substance abuse, uncomplicated: Secondary | ICD-10-CM

## 2022-02-12 NOTE — Progress Notes (Signed)
[facility] Progress Note ? ?Patient: [name]    UXL:[id number]  DOA: 02/07/2022    ?Date of Service: the patient was seen and examined on 02/12/2022 ? ?Brief hospital course: ?Patient is a 76 year old male with past medical history of hypertension, depression, anxiety and polysubstance abuse who presented to the emergency room with a decreased level of responsiveness.  According to the patient's ex-wife, she stated that he had been taking a lot of delta 8 THC Gummies would not consistently stay awake.  Blood work and other work-up unremarkable.   ? ?By 4/21 morning, patient somewhat more awake, but quite confused and delirious.  Would not answer questions properly.  Not able to stand without being a fall risk. ? ?By 4/22, patient fully awake and alert x3.  Able to ambulate without difficulty.  He is fully aware of his surroundings and able to answer questions appropriately.  Felt to medically stable for discharge.  However, family stated that they would not take him home unless he goes to drug rehab, which the patient is willing to do.  Case management attempted to find a drug rehab facility, but only outpatient was available, not inpatient.  Prior to discharge, on 4/24, family shared concerns that patient has in the past tried to intentionally overdose and says that he does not want to live anymore.  When this was revealed, patient involuntarily committed.  Psychiatry consulted.  Waiting for further psychiatry evaluation currently. ? ?Assessment and Plan: ?Assessment and Plan: ?Suicidal ideation ?It is unclear if patient was suicidal this time around.  He has been evasive somewhat in his answers.  He states that he is not suicidal now, although that is only after being told that he cannot leave needs to be further evaluated by psychiatry. ? ?Leg wound, left ?Patient fell a few months ago and since then he states the wound on the anterior aspect of his front leg has not healed very much.  According to  nursing had minimal scabbing over.  Seen by wound care who recommended daily Xeroform dressing changes covered by dry gauze and secured either with wrap or foam. ? ? ?Polysubstance abuse ([place]) ?- History of heroin, cocaine, marijuana use.  Urine drug screen notes THC and tricyclic's.  Patient states he took too many [place].  Once he woke up, family refused to take him home saying that he needed to go to drug rehab as he has done this often.  Patient initially amenable to this, but no inpatient drug rehab facilities available in the outpatient.  Family is concerned about patient being suicidal as above. ? ?Tetrahydrocannabinol (THC) use disorder, moderate, dependence ([place]) ?Advised to quit illegal drugs. ? ?Encephalopathy-resolved as of 02/10/2022 ?Drug use is finally cleared patient's system.  He is now fully awake, no longer delirious and alert and oriented x3, answering questions appropriately, aware who the president is.  Urine drug screen noted to be positive for benzos, THC and tricyclic antidepressants.  Benzos are probably due to the Ativan he was given for agitation.  ? ?Diabetes mellitus type 2, diet-controlled ([place]) ?Noted to have some mild hyperglycemia.  A1c at 6.7.  Will give patient education.  Diet controlled for now. ? ?Hyperlipidemia ?Statin restarted ? ?Hypokalemia ?Magnesium level stable.  Replacing as needed. ? ?Anxiety and depression ?Have restarted Seroquel and SSRI.  Continue daily low-dose Ativan. ? ?Essential hypertension ?Relatively stable.  Home medications were restarted except for diuretics.  As needed IV antihypertensives if blood pressure stays above 160.  For now. ? ?Chronic low back pain (Location of Primary Source of Pain) ([place]) ?Avoid narcotics.  We will try lidocaine patch ? ?Overweight (BMI 25.0-29.9) ?Meets criteria BMI greater than 25 ? ?Unresponsive episode ?- Patient's opens eyes briefly and mumbles to sternal and loud verbal stimuli ?- Admit to telemetry medical,  observation ?- Anticipate discharge in the a.m. ? ? ? ? ? ? ?Body mass index is 27.18 kg/m?.  ?  ?   ? ?Consultants: ?Psychiatry ? ?Procedures: ?None ? ?Antimicrobials: ?None ? ?Code Status: Full code ? ? ?Subjective: Patient with no complaints ? ?Objective: ?Vital signs were reviewed and unremarkable. ?Vitals:  ? 02/12/22 0948 02/12/22 1449  ?BP: 110/67 (!) 121/57  ?Pulse: 84 84  ?Resp: 18 18  ?Temp: 98.5 ?F (36.9 ?C) 99.2 ?F (37.3 ?C)  ?SpO2: 98% 100%  ? ? ?Intake/Output Summary (Last 24 hours) at 02/12/2022 1724 ?Last data filed at 02/12/2022 0900 ?Gross per 24 hour  ?Intake 120 ml  ?Output --  ?Net 120 ml  ? ?Filed Weights  ? 02/07/22 1514 02/07/22 2229  ?Weight: 66 kg 67.4 kg  ? ?Body mass index is 27.18 kg/m?. ? ?Exam: ? ?General: Alert and oriented x3, no acute distress ?HEENT: Normocephalic, atraumatic, mucous membranes are dry ?Cardiovascular: Regular rate and rhythm, S1-S2 ?Respiratory: Clear to auscultation bilaterally ?Abdomen: Soft, nontender, nondistended, positive bowel sounds ?Musculoskeletal: No clubbing or cyanosis or edema ?Skin: Patient has a 1.5 cm wound on the anterior aspect of his left leg below his knee, currently bandaged ?Psychiatry: Appropriate, no evidence of psychoses ?Neurology: No focal deficits ? ?Data Reviewed: ?Noted A1c of 6.7 ? ?Disposition:  ?Status is: Inpatient ?Remains inpatient appropriate because: Needing clearance from psychiatry ?  ? ?Anticipated discharge date: 4/26 ? ? ?Family Communication: Updated daughters by phone ?DVT Prophylaxis: ?enoxaparin (LOVENOX) injection 40 mg Start: 02/07/22 2200 ?Place TED hose Start: 02/07/22 2043 ? ? ? ?Author: ?Annita Brod ,MD ?02/12/2022 5:24 PM ? ?To reach On-call, see care teams to locate the attending and reach out via [email or web address]. ?Between 7PM-7AM, please contact night-coverage ?If you still have difficulty reaching the attending provider, please page the [facility] (Director on Call) for [facility] on amion for  assistance. ? ?

## 2022-02-12 NOTE — Assessment & Plan Note (Addendum)
It is unclear if patient is suicidal this time around.  He has been evasive somewhat in his answers.   ?Patient evaluated by psych, not a candidate for inpatient psych hospitalization. ?IVC discontinued.  Patient is not suicidal.  Patient is being discharged home ?

## 2022-02-13 ENCOUNTER — Encounter: Payer: Self-pay | Admitting: Internal Medicine

## 2022-02-13 DIAGNOSIS — F1220 Cannabis dependence, uncomplicated: Secondary | ICD-10-CM

## 2022-02-13 DIAGNOSIS — G9340 Encephalopathy, unspecified: Secondary | ICD-10-CM | POA: Diagnosis not present

## 2022-02-13 DIAGNOSIS — F1910 Other psychoactive substance abuse, uncomplicated: Secondary | ICD-10-CM | POA: Diagnosis not present

## 2022-02-13 LAB — CBC
HCT: 38.8 % — ABNORMAL LOW (ref 39.0–52.0)
Hemoglobin: 12.9 g/dL — ABNORMAL LOW (ref 13.0–17.0)
MCH: 29.7 pg (ref 26.0–34.0)
MCHC: 33.2 g/dL (ref 30.0–36.0)
MCV: 89.2 fL (ref 80.0–100.0)
Platelets: 194 10*3/uL (ref 150–400)
RBC: 4.35 MIL/uL (ref 4.22–5.81)
RDW: 14.8 % (ref 11.5–15.5)
WBC: 7.6 10*3/uL (ref 4.0–10.5)
nRBC: 0 % (ref 0.0–0.2)

## 2022-02-13 LAB — COMPREHENSIVE METABOLIC PANEL
ALT: 22 U/L (ref 0–44)
AST: 29 U/L (ref 15–41)
Albumin: 3.8 g/dL (ref 3.5–5.0)
Alkaline Phosphatase: 58 U/L (ref 38–126)
Anion gap: 7 (ref 5–15)
BUN: 20 mg/dL (ref 8–23)
CO2: 23 mmol/L (ref 22–32)
Calcium: 8.9 mg/dL (ref 8.9–10.3)
Chloride: 105 mmol/L (ref 98–111)
Creatinine, Ser: 0.76 mg/dL (ref 0.61–1.24)
GFR, Estimated: >60 mL/min (ref >60)
Glucose, Bld: 150 mg/dL — ABNORMAL HIGH (ref 70–99)
Potassium: 3.8 mmol/L (ref 3.5–5.1)
Sodium: 135 mmol/L (ref 135–145)
Total Bilirubin: 0.6 mg/dL (ref 0.3–1.2)
Total Protein: 7.2 g/dL (ref 6.5–8.1)

## 2022-02-13 LAB — MAGNESIUM: Magnesium: 2.1 mg/dL (ref 1.7–2.4)

## 2022-02-13 NOTE — Consult Note (Signed)
[facility] Face-to-Face Psychiatry Consult  ? ?Reason for Consult:  THC misuse ?Referring Physician:  Amy Cox/PDwyane Dee ?Patient Identification: [name] ?MRN:  [id number] ?Principal Diagnosis: Tetrahydrocannabinol (THC) use disorder, moderate, dependence ([place]) ?Diagnosis:  Principal Problem: ?  Tetrahydrocannabinol (THC) use disorder, moderate, dependence ([place]) ?Active Problems: ?  Hyperlipidemia ?  Essential hypertension ?  Chronic low back pain (Location of Primary Source of Pain) ([place]) ?  Anxiety and depression ?  Hypokalemia ?  Polysubstance abuse ([place]) ?  Overweight (BMI 25.0-29.9) ?  Diabetes mellitus type 2, diet-controlled ([place]) ?  Leg wound, left ?  Suicidal ideation ? ? ?Total Time spent with patient: 1 hour ? ?Subjective:   ?[name] is a 76 y.o. male patient admitted with [facility] misuse/overdose/AMS. ? ?HPI: Patient was admitted to the medical unit on 02/07/22 after presentation to the emergency department for altered mentation patient had been taking a lot of "delta 8 THC Gummies."  Patient estimates more than 1000 mg. ? ?On this evaluation, the patient is alert and oriented x4.  He is pleasant and cooperative.  Patient has clear, coherent speech.  Speaks in linear sentences.  He denies that he took the Veteran in an attempt to end his life.  He states that he has chronic back pain and the Gummies help where the tramadol that he was prescribed really does not.  Patient admits to problems with addiction to heroin in the 1960s.  He states that he has been decades since he has used that.  He states that he was in "drug rehab" a few years ago after getting dependent on oxycodone for his back pain.  There is some disagreement between he, his ex-wife, and his daughter, about whether or not patient misuses the delta 8 Gummies or other substances.  Patient states that he lives with his ex-wife and his daughter and granddaughter.  Daughter and ex-wife on the home.  Ex-wife wants him to go to drug  rehabilitation prior to coming back home.  Patient may be minimizing  ? ? ?This Probation officer spoke with patient's ex-wife via patient calling her and putting her on speaker phone.  She has voiced her concerns with the hospital not providing direct admission to an inpatient drug rehab program.  She also states that he is misusing the tramadol that he is prescribed for his back pain, taking more than he should.  Patient disputes this.  Discussed with both of them that psychiatrically, patient does not meet inpatient criteria and psychiatry does not arrange admission to inpatient drug rehab for medical patients.  It is my understanding that transition of care team does not either, (this is per secure chat between the medical team and the psychiatric team).  Writer suggested that ex-wife and patient check with the medical team regarding his options.  In the meantime, patient's family is looking for a rehabilitation program.  Writer suggested checking with the program he was in a few years ago.  Also suggested getting a referral from his psychiatrist or primary care doctor to a pain clinic.  All patient and ex-wife states that the pain clinic ""did not work." ? ?Patient may be minimizing his drug misuse, but it does not appear that he is trying to intentionally harm himself.  Patient expresses to this writer that his ex-wife is very much opposed to using delta 8 Gummies.  He states that he orders them on line and she will retrieve them from the mailbox and dispose of them.  It appears  that this is a source of frustration for both of them. ? ?Patient does not meet criteria for inpatient psychiatric hospitalization.  He does not voice suicidal thoughts or intent.  Denies homicidal ideation, paranoia, auditory or visual hallucinations.  Denies any thoughts of harming anyone else.  Patient has outpatient psychiatrist who is managing his medications for bipolar 2 disorder.  Recommended to patient that he follow up with his  psychiatrist for any recommendations in light of this latest incident; patient should notify his psychiatrist as soon as possible. ? ?Past Psychiatric History: Polysubstance abuse, bipolar 2 disorder ? ?Risk to Self:   ?Risk to Others:   ?Prior Inpatient Therapy:   ?Prior Outpatient Therapy:   ? ?Past Medical History:  ?Past Medical History:  ?Diagnosis Date  ? Anxiety   ? Arthritis   ? DDD, lumbar radiculopathy  ? Bell's palsy   ? hx of, right side  ? Bell's palsy   ? Chronic back pain   ? followed by pain clinic at [place]  ? Depression   ? hx of depression  ? GERD (gastroesophageal reflux disease)   ? Hepatitis   ? hx of hepatitis C treated with [facility] per patient  ? History of kidney stones   ? Hypertension   ? Kidney stone   ? hx of  ? Pre-diabetes   ? Stroke ([place]) 02/2018  ? loss of peripheral vision and balance unsteady  ? Substance abuse ([place])   ? heroin  ? Unresponsive episode 02/07/2022  ? Wears dentures   ? Has full upper.  Does not wear.  ?  ?Past Surgical History:  ?Procedure Laterality Date  ? ANTERIOR LUMBAR FUSION  05/13/2012  ? Procedure: ANTERIOR LUMBAR FUSION 1 LEVEL;  Surgeon: Melina Schools, MD;  Location: [place];  Service: Orthopedics;  Laterality: N/A;  ALIF L5-S1  ? CATARACT EXTRACTION W/PHACO Left 01/01/2021  ? Procedure: CATARACT EXTRACTION PHACO AND INTRAOCULAR LENS PLACEMENT (IOC) LEFT DIABETIC 2.28 00:24.7;  Surgeon: Eulogio Bear, MD;  Location: [place];  Service: Ophthalmology;  Laterality: Left;  covid + 11-30-20  ? CATARACT EXTRACTION W/PHACO Right 01/15/2021  ? Procedure: CATARACT EXTRACTION PHACO AND INTRAOCULAR LENS PLACEMENT ([place]) RIGHT DIABETIC;  Surgeon: Eulogio Bear, MD;  Location: [place];  Service: Ophthalmology;  Laterality: Right;  2.62 ?0:34.5  ? CHOLECYSTECTOMY    ? COLONOSCOPY WITH PROPOFOL N/A 09/19/2021  ? Procedure: COLONOSCOPY WITH PROPOFOL;  Surgeon: Toledo, Benay Pike, MD;  Location: [facility];  Service: Gastroenterology;  Laterality:  N/A;  ? CYSTOSCOPY W/ RETROGRADES Left 08/04/2018  ? Procedure: CYSTOSCOPY WITH RETROGRADE PYELOGRAM;  Surgeon: Abbie Sons, MD;  Location: [facility];  Service: Urology;  Laterality: Left;  ? CYSTOSCOPY WITH INSERTION OF UROLIFT N/A 01/18/2020  ? Procedure: CYSTOSCOPY WITH INSERTION OF UROLIFT;  Surgeon: Abbie Sons, MD;  Location: [facility];  Service: Urology;  Laterality: N/A;  ? CYSTOSCOPY WITH STENT PLACEMENT Left 08/04/2018  ? Procedure: CYSTOSCOPY WITH STENT PLACEMENT;  Surgeon: Abbie Sons, MD;  Location: [facility];  Service: Urology;  Laterality: Left;  ? EXTRACORPOREAL SHOCK WAVE LITHOTRIPSY  2012  ? EXTRACORPOREAL SHOCK WAVE LITHOTRIPSY Left 08/20/2018  ? Procedure: EXTRACORPOREAL SHOCK WAVE LITHOTRIPSY (ESWL);  Surgeon: Hollice Espy, MD;  Location: [facility];  Service: Urology;  Laterality: Left; (cancelled)  ? EXTRACORPOREAL SHOCK WAVE LITHOTRIPSY Left 08/27/2018  ? Procedure: EXTRACORPOREAL SHOCK WAVE LITHOTRIPSY (ESWL);  Surgeon: Abbie Sons, MD;  Location: [facility];  Service: Urology;  Laterality: Left;  ?  EXTRACORPOREAL SHOCK WAVE LITHOTRIPSY Right 10/15/2018  ? Procedure: EXTRACORPOREAL SHOCK WAVE LITHOTRIPSY (ESWL);  Surgeon: Billey Co, MD;  Location: [facility];  Service: Urology;  Laterality: Right;  ? KIDNEY STONE SURGERY    ? LUMBAR FUSION  05/13/2012  ? ?Family History:  ?Family History  ?Problem Relation Age of Onset  ? Stroke Mother   ? Diabetes Mother   ? Lung cancer Father   ? Diabetes Father   ? ?Family Psychiatric  History: Unknown ?Social History:  ?Social History  ? ?Substance and Sexual Activity  ?Alcohol Use Never  ? Alcohol/week: 0.0 standard drinks  ?   ?Social History  ? ?Substance and Sexual Activity  ?Drug Use Not Currently  ? Types: Heroin, Cocaine, Marijuana  ? Comment: 1960's  ?  ?Social History  ? ?Socioeconomic History  ? Marital status: Divorced  ?  Spouse name: Not on file  ? Number of children: 2  ? Years of education: Not on file  ? Highest  education level: Associate degree: occupational, Hotel manager, or vocational program  ?Occupational History  ? Not on file  ?Tobacco Use  ? Smoking status: Every Day  ?  Packs/day: 0.25  ?  Years: 53.00  ?  Pack years: 13.25  ?  T

## 2022-02-13 NOTE — Progress Notes (Signed)
?Progress Note ? ? ?Patient: [name] GEX:[id number] DOB: 1946/04/29 DOA: 02/07/2022     5 ? ?DOS: the patient was seen and examined on 02/13/2022 ?  ?Brief hospital course: ?This 76 year old male with PMH significant of hypertension, depression, anxiety and polysubstance abuse who presented to the emergency room with Altered mentation. According to the patient's ex-wife, he had been taking a lot of delta 8 THC Gummies, he consistently remains sleepy..  Blood work and other work-up unremarkable.   ?Patient fully awake and alert x 3, Able to ambulate without difficulty.  He is fully aware of his surroundings and able to answer questions appropriately.  Felt to medically stable for discharge.  However, family stated that they would not take him home unless he goes to drug rehab, which the patient is willing to do.  Case management attempted to find a drug rehab facility, but only outpatient was available, not inpatient.  Prior to discharge, on 4/24, family shared concerns that patient has in the past tried to intentionally overdose and says that he does not want to live anymore.  When this was revealed, patient involuntarily committed.  Psychiatry consulted.  Awaiting psychiatry evaluation. ? ?Assessment and Plan: ?Suicidal ideation ?It is unclear if patient is suicidal this time around.  He has been evasive somewhat in his answers.   ?He states that he is not suicidal now, although that is only after being told that he cannot leave, needs to be further evaluated by psychiatry. ?Psychiatry evaluation pending. ? ?Leg wound, left ?Patient fell few months back and since then he has wound on the anterior aspect of his left leg,  has not healed well..  According to nursing had minimal scabbing over.  He was seen by wound care who recommended daily Xeroform dressing changes covered by dry gauze and secured either with wrap or foam. ? ? ?Polysubstance abuse ([place]) ?Patient reports history of heroin, cocaine, marijuana use.   Urine drug screen notes THC and tricyclic's.   ?Patient states he took too many [place].  Now he is more alert, family refused to take him home saying that he needed to go to drug rehab as he has done this often.  Patient initially amenable to this, but no inpatient drug rehab facilities available in the outpatient.  Family is concerned about patient being suicidal as above. ? ?Tetrahydrocannabinol (THC) use disorder, moderate, dependence ([place]) ?Advised to quit illegal drugs. ? ?Encephalopathy-resolved as of 02/10/2022 ?Drug use is finally cleared patient's system.  He is now fully awake, no longer delirious and alert and oriented x 3, answering questions appropriately, aware who the president is.  Urine drug screen noted to be positive for benzos, THC and tricyclic antidepressants.  Benzos are probably due to the Ativan, he was given for agitation.  ? ?Diabetes mellitus type 2, diet-controlled ([place]) ?Hb A1c 6.7 .  Diet controlled now. ?Diabetic education discussed. ? ?Hyperlipidemia ?Continue Lipitor. ? ?Hypokalemia ?Replaced and resolved. ? ?Anxiety and depression ?Continue Seroquel and SSRI.  Continue daily low-dose Ativan. ? ?Essential hypertension ?Continue amlodipine. ? ?Chronic low back pain (Location of Primary Source of Pain) ([place]) ?Avoid narcotics.  Continue lidocaine patch. ? ?Overweight (BMI 25.0-29.9) ?Diet and exercise discussed in detail. ? ?Unresponsive episode-resolved as of 02/13/2022 ?- Patient's opens eyes briefly and mumbles to sternal and loud verbal stimuli ?- Admit to telemetry medical, observation ?- Anticipate discharge in the a.m. ? ? ? ?Subjective: Patient was seen and examined at bedside.  Overnight events noted. ?Patient was  sitting comfortably on the chair, watching television, denies any suicidal ideations. ? ?Physical Exam: ?Vitals:  ? 02/12/22 0948 02/12/22 1449 02/12/22 2008 02/13/22 0602  ?BP: 110/67 (!) 121/57 113/63 115/65  ?Pulse: 84 84 73 82  ?Resp: '18 18 17 18  '$ ?Temp:  98.5 ?F (36.9 ?C) 99.2 ?F (37.3 ?C) 98.5 ?F (36.9 ?C) 98.3 ?F (36.8 ?C)  ?TempSrc:   Oral   ?SpO2: 98% 100% 97% 98%  ?Weight:      ?Height:      ? ?General exam: Alert, oriented x 3, no acute distress.  Deconditioned ?Respiratory system: CTA bilaterally, no wheezing, no crackles, normal respiratory effort. ?Cardiovascular system: S1-S2 heard, regular rate and rhythm, no murmur. ?Gastrointestinal system: Abdomen is soft, nontender, nondistended, BS+ ?Central nervous system: Alert, oriented x3, no focal deficits. ?Extremities: 1.5 cm wound on the anterior aspect of the left leg below his knee, covered with dressing ?Psychiatry: Mood, insight, judgment normal.  Denies SI/HI ? ?Data Reviewed: ?I have Reviewed nursing notes, Vitals, and Lab results since pt's last encounter. Pertinent lab results CBC, BMP, mag, Phos ?I have ordered test including CBC, BMP ?I have reviewed the last note from hospitalist,  ?I have discussed pt's care plan and test results with patient.  ? ?Family Communication: Spoke with wife on phone ? ?Disposition: ?Status is: Inpatient ?Remains inpatient appropriate because:  ?Admitted for altered mentation which is now resolved.  Family raised the concern about suicidal ideations.  Psychiatry consulted awaiting evaluation.  Anticipated discharge home in 1 to 2 days. ? ? ? ? Planned Discharge Destination: Home. ? ? ? ? ?Time spent: 50 minutes ? ?Author: ?Shawna Clamp, MD ?02/13/2022 10:24 AM ? ?For on call review [email or web address].  ?

## 2022-02-14 MED ORDER — LORAZEPAM 0.5 MG PO TABS: 0.2500 mg | ORAL_TABLET | Freq: Every day | ORAL | 0 refills | Status: AC

## 2022-02-14 NOTE — Progress Notes (Signed)
AVS given and reviewed with pt. Medications discussed. Signed, printed prescription provided to patient. Pack of cigarettes and lighter returned to patient. All questions answered to satisfaction. Pt verbalized understanding of information given. Pt chose to walk off the unit with all belongings.  ?

## 2022-02-14 NOTE — Discharge Instructions (Signed)
Advised to follow up PCP in one week. ?Advised to check Labs in one week. ?Advised to follow up Psychiatrist as scheduled. ?

## 2022-02-14 NOTE — Discharge Summary (Signed)
?Physician Discharge Summary ?  ?Patient: [name] MRN: [id number] DOB: 07/19/1946  ?Admit date:     02/07/2022  ?Discharge date: 02/14/22  ?Discharge Physician: Shawna Clamp  ? ?PCP: Tracie Harrier, MD  ? ?Recommendations at discharge:  ?Advised to follow up PCP in one week. ?Advised to check Labs in one week. ?Advised to follow up Psychiatrist as scheduled. ? ?Discharge Diagnoses: ?Principal Problem: ?  Tetrahydrocannabinol (THC) use disorder, moderate, dependence ([place]) ?Active Problems: ?  Suicidal ideation ?  Polysubstance abuse ([place]) ?  Leg wound, left ?  Diabetes mellitus type 2, diet-controlled ([place]) ?  Hyperlipidemia ?  Hypokalemia ?  Anxiety and depression ?  Essential hypertension ?  Chronic low back pain (Location of Primary Source of Pain) ([place]) ?  Overweight (BMI 25.0-29.9) ? ?Resolved Problems: ?  Encephalopathy ? ?Hospital Course: ?This 76 year old male with PMH significant of hypertension, depression, anxiety and polysubstance abuse who presented to the emergency room with Altered mentation. According to the patient's ex-wife, he had been taking a lot of delta 8 THC Gummies, he consistently remains sleepy..  Blood work and other work-up unremarkable.   ?Patient fully awake and alert x 3, Able to ambulate without difficulty.  He is fully aware of his surroundings and able to answer questions appropriately.  Felt to medically stable for discharge.  However, family stated that they would not take him home unless he goes to drug rehab, which the patient is willing to do.  Case management attempted to find a drug rehab facility, but only outpatient was available, not inpatient.  Prior to discharge, on 4/24, family shared concerns that patient has in the past tried to intentionally overdose and says that he does not want to live anymore.  When this was revealed, patient involuntarily committed.  Psychiatry consulted.  Patient deemed not a candidate for inpatient psych hospitalization.  Patient  does not appear suicidal.  Involuntary commitment discontinued.  Patient feels better and patient is being discharged home ? ?Assessment and Plan: ?* Tetrahydrocannabinol (THC) use disorder, moderate, dependence ([place]) ?Advised to quit illegal drugs. ? ?Suicidal ideation ?It is unclear if patient is suicidal this time around.  He has been evasive somewhat in his answers.   ?Patient evaluated by psych, not a candidate for inpatient psych hospitalization. ?IVC discontinued.  Patient is not suicidal.  Patient is being discharged home ? ?Leg wound, left ?Patient fell few months back and since then he has wound on the anterior aspect of his left leg,  has not healed well..  According to nursing had minimal scabbing over.  He was seen by wound care who recommended daily Xeroform dressing changes covered by dry gauze and secured either with wrap or foam. ? ? ?Polysubstance abuse ([place]) ?Patient reports history of heroin, cocaine, marijuana use.  Urine drug screen notes THC and tricyclic's.   ?Patient states he took too many [place].  Now he is more alert, family refused to take him home saying that he needed to go to drug rehab as he has done this often.  Patient initially amenable to this, but no inpatient drug rehab facilities available in the outpatient.  Family is concerned about patient being suicidal as above. ? ?Encephalopathy-resolved as of 02/10/2022 ?Drug use is finally cleared patient's system.  He is now fully awake, no longer delirious and alert and oriented x 3, answering questions appropriately, aware who the president is.  Urine drug screen noted to be positive for benzos, THC and tricyclic antidepressants.  Benzos  are probably due to the Ativan, he was given for agitation.  ? ?Diabetes mellitus type 2, diet-controlled ([place]) ?Hb A1c 6.7 .  Diet controlled now. ?Diabetic education discussed. ? ?Hyperlipidemia ?Continue Lipitor. ? ?Hypokalemia ?Replaced and resolved. ? ?Anxiety and depression ?Continue  Seroquel and SSRI.  Continue daily low-dose Ativan. ? ?Essential hypertension ?Continue amlodipine. ? ?Chronic low back pain (Location of Primary Source of Pain) ([place]) ?Avoid narcotics.  Continue lidocaine patch. ? ?Overweight (BMI 25.0-29.9) ?Diet and exercise discussed in detail. ? ?Unresponsive episode-resolved as of 02/13/2022 ?- Patient's opens eyes briefly and mumbles to sternal and loud verbal stimuli ?- Admit to telemetry medical, observation ?- Anticipate discharge in the a.m. ? ? ? ?Pain control - [employer] Controlled Substance Reporting System database was reviewed. and patient was instructed, not to drive, operate heavy machinery, perform activities at heights, swimming or participation in water activities or provide baby-sitting services while on Pain, Sleep and Anxiety Medications; until their outpatient Physician has advised to do so again. Also recommended to not to take more than prescribed Pain, Sleep and Anxiety Medications.  ?Consultants: psychiatry ?Procedures performed: NOne  ?Disposition: Home ?Diet recommendation:  ?Discharge Diet Orders (From admission, onward)  ? ?  Start     Ordered  ? 02/14/22 0000  Diet - low sodium heart healthy       ? 02/14/22 1029  ? 02/14/22 0000  Diet Carb Modified       ? 02/14/22 1029  ? 02/09/22 0000  Diet - low sodium heart healthy       ? 02/09/22 1507  ? ?  ?  ? ?  ? ?Carb modified diet ?DISCHARGE MEDICATION: ?Allergies as of 02/14/2022   ?No Known Allergies ?  ? ?  ?Medication List  ?  ? ?TAKE these medications   ? ?acetaminophen 325 MG tablet ?Commonly known as: TYLENOL ?Take 2 tablets (650 mg total) by mouth every 6 (six) hours as needed for mild pain (or Fever >/= 101). ?  ?amLODipine 2.5 MG tablet ?Commonly known as: NORVASC ?Take 2.5 mg by mouth daily. ?  ?aspirin 81 MG EC tablet ?Take 1 tablet (81 mg total) by mouth daily. Swallow whole. ?  ?atorvastatin 40 MG tablet ?Commonly known as: LIPITOR ?Take 40 mg by mouth daily. ?  ?baclofen 10 MG  tablet ?Commonly known as: LIORESAL ?Take 10 mg by mouth 3 (three) times daily. ?  ?clopidogrel 75 MG tablet ?Commonly known as: PLAVIX ?Take 75 mg by mouth daily. ?  ?[employer] D3 25 MCG (1000 UT) capsule ?Generic drug: Cholecalciferol ?Take 2,000 Units by mouth daily. ?  ?escitalopram 10 MG tablet ?Commonly known as: LEXAPRO ?Take 10 mg by mouth daily. ?  ?feeding supplement Liqd ?Take 237 mLs by mouth 2 (two) times daily between meals. ?  ?folic acid 1 MG tablet ?Commonly known as: FOLVITE ?Take 1 tablet (1 mg total) by mouth daily. ?  ?furosemide 20 MG tablet ?Commonly known as: LASIX ?Take 20 mg by mouth daily. ?  ?hydrochlorothiazide 12.5 MG tablet ?Commonly known as: HYDRODIURIL ?Take 12.5 mg by mouth daily. ?  ?ibuprofen 800 MG tablet ?Commonly known as: ADVIL ?Take 1 tablet (800 mg total) by mouth every 8 (eight) hours as needed. ?  ?LORazepam 0.5 MG tablet ?Commonly known as: ATIVAN ?Take 0.5 tablets (0.25 mg total) by mouth daily. ?  ?meclizine 25 MG tablet ?Commonly known as: ANTIVERT ?Take 25 mg by mouth 3 (three) times daily as needed for dizziness. ?  ?melatonin 5  MG Tabs ?Take 1 tablet (5 mg total) by mouth at bedtime. ?  ?nicotine 21 mg/24hr patch ?Commonly known as: NICODERM CQ - dosed in mg/24 hours ?Place 1 patch (21 mg total) onto the skin daily. ?  ?QUEtiapine 300 MG tablet ?Commonly known as: SEROQUEL ?Take 300 mg by mouth at bedtime. ?  ?risperiDONE 0.5 MG tablet ?Commonly known as: RISPERDAL ?Take 0.5 mg by mouth daily. ?  ?tamsulosin 0.4 MG Caps capsule ?Commonly known as: FLOMAX ?Take 0.4 mg by mouth daily. ?  ?traZODone 50 MG tablet ?Commonly known as: DESYREL ?Take 50 mg by mouth at bedtime. ?  ? ?  ? ?  ?  ? ? ?  ?Discharge Care Instructions  ?(From admission, onward)  ?  ? ? ?  ? ?  Start     Ordered  ? 02/14/22 0000  Discharge wound care:       ?Comments: Follow up PCP  ? 02/14/22 1029  ? ?  ?  ? ?  ? ? Follow-up Information   ? ? Tracie Harrier, MD Follow up in 1 week(s).   ?Specialty:  Internal Medicine ?Contact information: ?[address] ?[place] [address] ?[phone] ? ? ?  ?  ? ?  ?  ? ?  ? ?Discharge Exam: ?Filed Weights  ? 02/07/22 1514 02/07/22 2229  ?Weight: 66 kg 67.4 kg

## 2022-02-14 NOTE — Progress Notes (Signed)
Mobility Specialist - Progress Note ? ? 02/14/22 1100  ?Mobility  ?Activity Ambulated independently in hallway  ?Level of Assistance Independent  ?Assistive Device None  ?Distance Ambulated (ft) 200 ft  ?Activity Response Tolerated well  ?$Mobility charge 1 Mobility  ? ? ? ?Pt standing at door upon arrival using RA. Pt eager to leave facility for discharge, agreeable to walk. Pt ambulates 247f indep voicing no complaints and is left in room with needs in reach. ? ?[name]?Mobility Specialist ?02/14/22, 11:49 AM ? ? ? ? ?

## 2022-02-14 NOTE — Care Management Important Message (Signed)
Important Message ? ?Patient Details  ?Name: [name] ?MRN: [id number] ?Date of Birth: 1945-11-30 ? ? ?Medicare Important Message Given:  Yes ? ? ? ? ?Juliann Pulse A Quintara Bost ?02/14/2022, 10:21 AM ?

## 2022-02-14 NOTE — Plan of Care (Signed)
  Problem: Pain Managment: Goal: General experience of comfort will improve Outcome: Progressing   Problem: Safety: Goal: Ability to remain free from injury will improve Outcome: Progressing   Problem: Skin Integrity: Goal: Risk for impaired skin integrity will decrease Outcome: Progressing   

## 2022-03-11 ENCOUNTER — Encounter: Payer: Medicare PPO | Attending: Physician Assistant | Admitting: Physician Assistant

## 2022-03-11 DIAGNOSIS — I70248 Atherosclerosis of native arteries of left leg with ulceration of other part of lower left leg: Secondary | ICD-10-CM | POA: Diagnosis not present

## 2022-03-11 DIAGNOSIS — F3189 Other bipolar disorder: Secondary | ICD-10-CM | POA: Insufficient documentation

## 2022-03-11 DIAGNOSIS — I872 Venous insufficiency (chronic) (peripheral): Secondary | ICD-10-CM | POA: Diagnosis present

## 2022-03-11 DIAGNOSIS — I10 Essential (primary) hypertension: Secondary | ICD-10-CM | POA: Diagnosis not present

## 2022-03-11 DIAGNOSIS — Z8673 Personal history of transient ischemic attack (TIA), and cerebral infarction without residual deficits: Secondary | ICD-10-CM | POA: Diagnosis not present

## 2022-03-11 DIAGNOSIS — L97822 Non-pressure chronic ulcer of other part of left lower leg with fat layer exposed: Secondary | ICD-10-CM | POA: Diagnosis not present

## 2022-03-11 DIAGNOSIS — B182 Chronic viral hepatitis C: Secondary | ICD-10-CM | POA: Insufficient documentation

## 2022-03-11 NOTE — Progress Notes (Signed)
SHAILEN, THIELEN ([id number]) Visit Report for 03/11/2022 Arrival Information Details Patient Name: [name], [name]. Date of Service: 03/11/2022 12:45 PM Medical Record Number: [id number] Patient Account Number: [id number] Date of Birth/Sex: 04-Oct-1946 (76 y.o. M) Treating RN: Alycia Rossetti Primary Care Lochlyn Zullo: Tracie Harrier Other Clinician: Referring Kiyomi Pallo: Tracie Harrier Treating Soloman Mckeithan/Extender: Skipper Cliche in Treatment: 5 Visit Information History Since Last Visit Added or deleted any medications: No Patient Arrived: Walker Any new allergies or adverse reactions: No Arrival Time: 12:49 Hospitalized since last visit: No Accompanied By: self Pain Present Now: Yes Transfer Assistance: None Patient Requires Transmission-Based No Precautions: Patient Has Alerts: Yes Patient Alerts: Patient on Blood Thinner Electronic Signature(s) Signed: 03/11/2022 4:29:51 PM By: Alycia Rossetti Entered By: Alycia Rossetti on 03/11/2022 12:58:02 Dusek, Hal Hope ([id number]) -------------------------------------------------------------------------------- Clinic Level of Care Assessment Details Patient Name: [name] Date of Service: 03/11/2022 12:45 PM Medical Record Number: [id number] Patient Account Number: [id number] Date of Birth/Sex: 1946-01-30 (76 y.o. M) Treating RN: Alycia Rossetti Primary Care Akai Dollard: Tracie Harrier Other Clinician: Referring Guerin Lashomb: Tracie Harrier Treating Geneal Huebert/Extender: Skipper Cliche in Treatment: 5 Clinic Level of Care Assessment Items TOOL 4 Quantity Score '[]'$  - Use when only an EandM is performed on FOLLOW-UP visit 0 ASSESSMENTS - Nursing Assessment / Reassessment X - Reassessment of Co-morbidities (includes updates in patient status) 1 10 X- 1 5 Reassessment of Adherence to Treatment Plan ASSESSMENTS - Wound and Skin Assessment / Reassessment X - Simple Wound Assessment / Reassessment - one wound 1 5 '[]'$  - 0 Complex Wound  Assessment / Reassessment - multiple wounds '[]'$  - 0 Dermatologic / Skin Assessment (not related to wound area) ASSESSMENTS - Focused Assessment '[]'$  - Circumferential Edema Measurements - multi extremities 0 '[]'$  - 0 Nutritional Assessment / Counseling / Intervention '[]'$  - 0 Lower Extremity Assessment (monofilament, tuning fork, pulses) '[]'$  - 0 Peripheral Arterial Disease Assessment (using hand held doppler) ASSESSMENTS - Ostomy and/or Continence Assessment and Care '[]'$  - Incontinence Assessment and Management 0 '[]'$  - 0 Ostomy Care Assessment and Management (repouching, etc.) PROCESS - Coordination of Care X - Simple Patient / Family Education for ongoing care 1 15 '[]'$  - 0 Complex (extensive) Patient / Family Education for ongoing care X- 1 10 Staff obtains Programmer, systems, Records, Test Results / Process Orders '[]'$  - 0 Staff telephones HHA, Nursing Homes / Clarify orders / etc '[]'$  - 0 Routine Transfer to another Facility (non-emergent condition) '[]'$  - 0 Routine Hospital Admission (non-emergent condition) '[]'$  - 0 New Admissions / Biomedical engineer / Ordering NPWT, Apligraf, etc. '[]'$  - 0 Emergency Hospital Admission (emergent condition) X- 1 10 Simple Discharge Coordination '[]'$  - 0 Complex (extensive) Discharge Coordination PROCESS - Special Needs '[]'$  - Pediatric / Minor Patient Management 0 '[]'$  - 0 Isolation Patient Management '[]'$  - 0 Hearing / Language / Visual special needs '[]'$  - 0 Assessment of Community assistance (transportation, D/C planning, etc.) '[]'$  - 0 Additional assistance / Altered mentation '[]'$  - 0 Support Surface(s) Assessment (bed, cushion, seat, etc.) INTERVENTIONS - Wound Cleansing / Measurement Mauss, Lillie V. ([id number]) X- 1 5 Simple Wound Cleansing - one wound '[]'$  - 0 Complex Wound Cleansing - multiple wounds X- 1 5 Wound Imaging (photographs - any number of wounds) '[]'$  - 0 Wound Tracing (instead of photographs) X- 1 5 Simple Wound Measurement - one wound '[]'$  -  0 Complex Wound Measurement - multiple wounds INTERVENTIONS - Wound Dressings '[]'$  - Small Wound Dressing one or multiple wounds 0 '[]'$  - 0 Medium  Wound Dressing one or multiple wounds '[]'$  - 0 Large Wound Dressing one or multiple wounds X- 1 5 Application of Medications - topical '[]'$  - 0 Application of Medications - injection INTERVENTIONS - Miscellaneous '[]'$  - External ear exam 0 '[]'$  - 0 Specimen Collection (cultures, biopsies, blood, body fluids, etc.) '[]'$  - 0 Specimen(s) / Culture(s) sent or taken to Lab for analysis '[]'$  - 0 Patient Transfer (multiple staff / Civil Service fast streamer / Similar devices) '[]'$  - 0 Simple Staple / Suture removal (25 or less) '[]'$  - 0 Complex Staple / Suture removal (26 or more) '[]'$  - 0 Hypo / Hyperglycemic Management (close monitor of Blood Glucose) '[]'$  - 0 Ankle / Brachial Index (ABI) - do not check if billed separately X- 1 5 Vital Signs Has the patient been seen at the hospital within the last three years: Yes Total Score: 80 Level Of Care: New/Established - Level 3 Electronic Signature(s) Signed: 03/11/2022 4:29:51 PM By: Alycia Rossetti Entered By: Alycia Rossetti on 03/11/2022 13:34:56 Seelman, Hal Hope ([id number]) -------------------------------------------------------------------------------- Encounter Discharge Information Details Patient Name: [name] Date of Service: 03/11/2022 12:45 PM Medical Record Number: [id number] Patient Account Number: [id number] Date of Birth/Sex: [birth year] (76 y.o. M) Treating RN: Alycia Rossetti Primary Care Timara Loma: Tracie Harrier Other Clinician: Referring Ondrea Dow: Tracie Harrier Treating Siani Utke/Extender: Skipper Cliche in Treatment: 5 Encounter Discharge Information Items Discharge Condition: Stable Ambulatory Status: Walker Discharge Destination: Home Transportation: Private Auto Accompanied By: self Schedule Follow-up Appointment: Yes Clinical Summary of Care: Electronic Signature(s) Signed: 03/11/2022  4:29:51 PM By: Alycia Rossetti Entered By: Alycia Rossetti on 03/11/2022 13:37:40 Arther, Hal Hope ([id number]) -------------------------------------------------------------------------------- Lower Extremity Assessment Details Patient Name: [name] Date of Service: 03/11/2022 12:45 PM Medical Record Number: [id number] Patient Account Number: [id number] Date of Birth/Sex: September 28, 1946 (76 y.o. M) Treating RN: Alycia Rossetti Primary Care Dejanee Thibeaux: Tracie Harrier Other Clinician: Referring Jaykob Minichiello: Tracie Harrier Treating Marlow Berenguer/Extender: Skipper Cliche in Treatment: 5 Notes Patient declined to remove shoe. Unable to measure ankle and calf and check pulse. Electronic Signature(s) Signed: 03/11/2022 4:29:51 PM By: Alycia Rossetti Entered By: Alycia Rossetti on 03/11/2022 13:06:35 Bellmore, Hal Hope ([id number]) -------------------------------------------------------------------------------- Multi Wound Chart Details Patient Name: [name] Date of Service: 03/11/2022 12:45 PM Medical Record Number: [id number] Patient Account Number: [id number] Date of Birth/Sex: 11/09/1945 (76 y.o. M) Treating RN: Alycia Rossetti Primary Care Khup Sapia: Tracie Harrier Other Clinician: Referring Anamarie Hunn: Tracie Harrier Treating Chai Routh/Extender: Skipper Cliche in Treatment: 5 Vital Signs Height(in): 63 Pulse(bpm): 30 Weight(lbs): 150 Blood Pressure(mmHg): 127/63 Body Mass Index(BMI): 26.6 Temperature(F): 98.2 Respiratory Rate(breaths/min): 16 Photos: [N/A:N/A] Wound Location: Left, Proximal Lower Leg N/A N/A Wounding Event: Trauma N/A N/A Primary Etiology: Venous Leg Ulcer N/A N/A Comorbid History: Hypertension, Peripheral Arterial N/A N/A Disease Date Acquired: 10/21/2021 N/A N/A Weeks of Treatment: 5 N/A N/A Wound Status: Open N/A N/A Wound Recurrence: No N/A N/A Measurements L x W x D (cm) 0.1x0.1x0.1 N/A N/A Area (cm) : 0.008 N/A N/A Volume (cm) : 0.001 N/A N/A % Reduction  in Area: 99.70% N/A N/A % Reduction in Volume: 99.90% N/A N/A Classification: Full Thickness Without Exposed N/A N/A Support Structures Exudate Amount: Medium N/A N/A Exudate Type: Serosanguineous N/A N/A Exudate Color: red, brown N/A N/A Granulation Amount: Medium (34-66%) N/A N/A Granulation Quality: Red N/A N/A Necrotic Amount: Medium (34-66%) N/A N/A Exposed Structures: Fat Layer (Subcutaneous Tissue): N/A N/A Yes Fascia: No Tendon: No Muscle: No Joint: No Bone: No Epithelialization: None N/A N/A Treatment Notes Electronic Signature(s) Signed: 03/11/2022  4:29:51 PM By: Alycia Rossetti Entered By: Alycia Rossetti on 03/11/2022 13:30:39 [name] ([id number]) -------------------------------------------------------------------------------- Multi-Disciplinary Care Plan Details Patient Name: [name], [name] Date of Service: 03/11/2022 12:45 PM Medical Record Number: [id number] Patient Account Number: [id number] Date of Birth/Sex: [birth year] (76 y.o. M) Treating RN: Alycia Rossetti Primary Care Shadara Lopez: Tracie Harrier Other Clinician: Referring Alfreddie Consalvo: Tracie Harrier Treating [place] Searles/Extender: Skipper Cliche in Treatment: 5 Active Inactive Wound/Skin Impairment Nursing Diagnoses: Knowledge deficit related to ulceration/compromised skin integrity Goals: Patient/caregiver will verbalize understanding of skin care regimen Date Initiated: 02/01/2022 Target Resolution Date: 03/03/2022 Goal Status: Active Ulcer/skin breakdown will have a volume reduction of 30% by week 4 Date Initiated: 02/01/2022 Target Resolution Date: 03/03/2022 Goal Status: Active Ulcer/skin breakdown will have a volume reduction of 50% by week 8 Date Initiated: 02/01/2022 Target Resolution Date: 04/03/2022 Goal Status: Active Ulcer/skin breakdown will have a volume reduction of 80% by week 12 Date Initiated: 02/01/2022 Target Resolution Date: 05/03/2022 Goal Status: Active Ulcer/skin breakdown will  heal within 14 weeks Date Initiated: 02/01/2022 Target Resolution Date: 06/03/2022 Goal Status: Active Interventions: Assess patient/caregiver ability to obtain necessary supplies Assess patient/caregiver ability to perform ulcer/skin care regimen upon admission and as needed Assess ulceration(s) every visit Notes: Electronic Signature(s) Signed: 03/11/2022 4:29:51 PM By: Alycia Rossetti Entered By: Alycia Rossetti on 03/11/2022 13:30:28 Trivett, Hal Hope ([id number]) -------------------------------------------------------------------------------- Pain Assessment Details Patient Name: [name] Date of Service: 03/11/2022 12:45 PM Medical Record Number: [id number] Patient Account Number: [id number] Date of Birth/Sex: 1946-04-17 (76 y.o. M) Treating RN: Alycia Rossetti Primary Care Mintie Witherington: Tracie Harrier Other Clinician: Referring Leobardo Granlund: Tracie Harrier Treating Alessander Sikorski/Extender: Skipper Cliche in Treatment: 5 Active Problems Location of Pain Severity and Description of Pain Patient Has Paino Yes Site Locations Rate the pain. Current Pain Level: 8 Pain Management and Medication Current Pain Management: Electronic Signature(s) Signed: 03/11/2022 4:29:51 PM By: Alycia Rossetti Entered By: Alycia Rossetti on 03/11/2022 12:59:39 Kozlowski, Hal Hope ([id number]) -------------------------------------------------------------------------------- Patient/Caregiver Education Details Patient Name: [name] Date of Service: 03/11/2022 12:45 PM Medical Record Number: [id number] Patient Account Number: [id number] Date of Birth/Gender: 01-Jun-1946 (76 y.o. M) Treating RN: Alycia Rossetti Primary Care Physician: Tracie Harrier Other Clinician: Referring Physician: Tracie Harrier Treating Physician/Extender: Skipper Cliche in Treatment: 5 Education Assessment Education Provided To: Patient Education Topics Provided Wound/Skin Impairment: Handouts: Caring for Your Ulcer, Other:  continue wound care as prescribed Methods: Demonstration, Explain/Verbal Responses: State content correctly Electronic Signature(s) Signed: 03/11/2022 4:29:51 PM By: Alycia Rossetti Entered By: Alycia Rossetti on 03/11/2022 13:36:14 Munce, Hal Hope ([id number]) -------------------------------------------------------------------------------- Wound Assessment Details Patient Name: [name]. Date of Service: 03/11/2022 12:45 PM Medical Record Number: [id number] Patient Account Number: [id number] Date of Birth/Sex: 1946/04/25 (76 y.o. M) Treating RN: Alycia Rossetti Primary Care Ali Mclaurin: Tracie Harrier Other Clinician: Referring Tamana Hatfield: Tracie Harrier Treating [place] Folden/Extender: Skipper Cliche in Treatment: 5 Wound Status Wound Number: 1 Primary Etiology: Venous Leg Ulcer Wound Location: Left, Proximal Lower Leg Wound Status: Open Wounding Event: Trauma Comorbid History: Hypertension, Peripheral Arterial Disease Date Acquired: 10/21/2021 Weeks Of Treatment: 5 Clustered Wound: No Photos Wound Measurements Length: (cm) 0.1 Width: (cm) 0.1 Depth: (cm) 0.1 Area: (cm) 0.008 Volume: (cm) 0.001 % Reduction in Area: 99.7% % Reduction in Volume: 99.9% Epithelialization: None Wound Description Classification: Full Thickness Without Exposed Support Structures Exudate Amount: Medium Exudate Type: Serosanguineous Exudate Color: red, brown Foul Odor After Cleansing: No Slough/Fibrino Yes Wound Bed Granulation Amount: Medium (34-66%) Exposed Structure Granulation Quality: Red Fascia Exposed: No Necrotic  Amount: Medium (34-66%) Fat Layer (Subcutaneous Tissue) Exposed: Yes Necrotic Quality: Adherent Slough Tendon Exposed: No Muscle Exposed: No Joint Exposed: No Bone Exposed: No Treatment Notes Wound #1 (Lower Leg) Wound Laterality: Left, Proximal Cleanser Soap and Water Discharge Instruction: Gently cleanse wound with antibacterial soap, rinse and pat dry prior to dressing  wounds [place], [place] ([id number]) Discharge Instruction: Apply Betadine as directed Topical Primary Dressing Secondary Dressing Secured With Compression Wrap Compression Stockings Add-Ons Electronic Signature(s) Signed: 03/11/2022 4:29:51 PM By: Alycia Rossetti Entered By: Alycia Rossetti on 03/11/2022 13:11:15 Broce, Hal Hope ([id number]) -------------------------------------------------------------------------------- Vitals Details Patient Name: [name] Date of Service: 03/11/2022 12:45 PM Medical Record Number: [id number] Patient Account Number: [id number] Date of Birth/Sex: 07/10/1946 (76 y.o. M) Treating RN: Alycia Rossetti Primary Care Mariell Nester: Tracie Harrier Other Clinician: Referring Joceline Hinchcliff: Tracie Harrier Treating Jearldine Cassady/Extender: Skipper Cliche in Treatment: 5 Vital Signs Time Taken: 12:58 Temperature (F): 98.2 Height (in): 63 Pulse (bpm): 85 Weight (lbs): 150 Respiratory Rate (breaths/min): 16 Body Mass Index (BMI): 26.6 Blood Pressure (mmHg): 127/63 Reference Range: 80 - 120 mg / dl Electronic Signature(s) Signed: 03/11/2022 4:29:51 PM By: Alycia Rossetti Entered By: Alycia Rossetti on 03/11/2022 12:59:15

## 2022-03-11 NOTE — Progress Notes (Addendum)
[name] ([id number]) Visit Report for 03/11/2022 Chief Complaint Document Details Patient Name: [name], [name]. Date of Service: 03/11/2022 12:45 PM Medical Record Number: [id number] Patient Account Number: [id number] Date of Birth/Sex: [birth year] (76 y.o. M) Treating RN: Cornell Barman Primary Care Provider: Tracie Harrier Other Clinician: Referring Provider: Tracie Harrier Treating Provider/Extender: Skipper Cliche in Treatment: 5 Information Obtained from: Patient Chief Complaint Left LE Ulcer Electronic Signature(s) Signed: 03/11/2022 1:10:16 PM By: Worthy Keeler PA-C Entered By: Worthy Keeler on 03/11/2022 13:10:15 [name] ([id number]) -------------------------------------------------------------------------------- HPI Details Patient Name: [name] Date of Service: 03/11/2022 12:45 PM Medical Record Number: [id number] Patient Account Number: [id number] Date of Birth/Sex: [birth year] (76 y.o. M) Treating RN: Cornell Barman Primary Care Provider: Tracie Harrier Other Clinician: Referring Provider: Tracie Harrier Treating Provider/Extender: Skipper Cliche in Treatment: 5 History of Present Illness HPI Description: 02-01-2022 upon evaluation today patient appears to be doing poorly in regard to his wound. He has been tolerating the dressing changes without complication. With that being said unfortunately he seems to be doing not so well with regard to his blood flow into this left lower extremity. He has a previous arterial study which showed that he had poor arterial flow with an ABI in 2018 of 0.53 with a TBI of 0.47 in the left leg. In regard to the right leg this was 0.66 for an ABI 0.51 for the TBI. Subsequently it has been obviously about 5 years since that time and things seem to have gotten worse we cannot even palpate nor Doppler a pulse here in the clinic today. Currently patient states that this has been going on for about 3 months in regard  to the wound. Fortunately I do not see any evidence that this is worsening. With that being said there is a little bit of evidence of healing which is good news but again he has been using basically just peroxide on the area and really nothing much more. Patient does have a history of noted peripheral vascular disease per above, hypertension, chronic viral hepatitis C, and bipolar disorder. He also apparently has a history of a stroke. 03-11-2022 upon evaluation today patient presents for follow-up he has not been seen here since February 01, 2022. With that being said at that time he tells me that he was having issues with his leg he also had some evidence of not so great arterial studies. With that being said I referred him to vascular that time he still has not seen them. Fortunately I do not see any evidence of active infection locally or systemically which is great news. Unfortunately he has not completely healed as best I can tell he does have an eschar and dry skin covering but again I do not want to pull on this and cause it to break apart or tear anything that is already healed. Electronic Signature(s) Signed: 03/11/2022 1:30:21 PM By: Worthy Keeler PA-C Entered By: Worthy Keeler on 03/11/2022 13:30:21 [name], [name] ([id number]) -------------------------------------------------------------------------------- Physical Exam Details Patient Name: [name] Date of Service: 03/11/2022 12:45 PM Medical Record Number: [id number] Patient Account Number: [id number] Date of Birth/Sex: 17-Jun-1946 (76 y.o. M) Treating RN: Cornell Barman Primary Care Provider: Tracie Harrier Other Clinician: Referring Provider: Tracie Harrier Treating Provider/Extender: Skipper Cliche in Treatment: 5 Constitutional Well-nourished and well-hydrated in no acute distress. Respiratory normal breathing without difficulty. Psychiatric this patient is able to make decisions and demonstrates good insight  into disease process. Alert and  Oriented x 3. pleasant and cooperative. Notes Upon inspection patient's wound bed actually showed signs of good granulation and epithelization at this point. Fortunately there does not appear to be any evidence of infection locally or systemically which is great news and overall this is mainly dry and more eschar like. It looks like it is already starting to lift up but I did not want to force this. Electronic Signature(s) Signed: 03/11/2022 1:30:42 PM By: Worthy Keeler PA-C Entered By: Worthy Keeler on 03/11/2022 13:30:42 [name], [name] ([id number]) -------------------------------------------------------------------------------- Physician Orders Details Patient Name: [name] Date of Service: 03/11/2022 12:45 PM Medical Record Number: [id number] Patient Account Number: [id number] Date of Birth/Sex: 1946-04-17 (76 y.o. M) Treating RN: Alycia Rossetti Primary Care Provider: Tracie Harrier Other Clinician: Referring Provider: Tracie Harrier Treating Provider/Extender: Skipper Cliche in Treatment: 5 Verbal / Phone Orders: No Diagnosis Coding ICD-10 Coding Code Description I87.2 Venous insufficiency (chronic) (peripheral) L97.822 Non-pressure chronic ulcer of other part of left lower leg with fat layer exposed I73.89 Other specified peripheral vascular diseases I10 Essential (primary) hypertension B18.2 Chronic viral hepatitis C F31.89 Other bipolar disorder Follow-up Appointments o Return Appointment in 3 weeks. Bathing/ Shower/ Hygiene o May shower; gently cleanse wound with antibacterial soap, rinse and pat dry prior to dressing wounds Edema Control - Lymphedema / Segmental Compressive Device / Other o Elevate, Exercise Daily and Avoid Standing for Long Periods of Time. o Elevate legs to the level of the heart and pump ankles as often as possible o Elevate leg(s) parallel to the floor when sitting. Wound Treatment Wound #1  - Lower Leg Wound Laterality: Left, Proximal Cleanser: Soap and Water 3 x Per Week/30 Days Discharge Instructions: Gently cleanse wound with antibacterial soap, rinse and pat dry prior to dressing wounds Peri-Wound Care: Betadine-swabs 3 x Per Week/30 Days Discharge Instructions: Apply Betadine as directed Electronic Signature(s) Signed: 03/11/2022 4:26:20 PM By: Worthy Keeler PA-C Signed: 03/11/2022 4:29:51 PM By: Alycia Rossetti Entered By: Alycia Rossetti on 03/11/2022 13:32:32 [name], [name] ([id number]) -------------------------------------------------------------------------------- Problem List Details Patient Name: [name] Date of Service: 03/11/2022 12:45 PM Medical Record Number: [id number] Patient Account Number: [id number] Date of Birth/Sex: 1946-09-08 (76 y.o. M) Treating RN: Cornell Barman Primary Care Provider: Tracie Harrier Other Clinician: Referring Provider: Tracie Harrier Treating Provider/Extender: Skipper Cliche in Treatment: 5 Active Problems ICD-10 Encounter Code Description Active Date MDM Diagnosis I87.2 Venous insufficiency (chronic) (peripheral) 02/01/2022 No Yes L97.822 Non-pressure chronic ulcer of other part of left lower leg with fat layer 02/01/2022 No Yes exposed I73.89 Other specified peripheral vascular diseases 02/01/2022 No Yes I10 Essential (primary) hypertension 02/01/2022 No Yes B18.2 Chronic viral hepatitis C 02/01/2022 No Yes F31.89 Other bipolar disorder 02/01/2022 No Yes Inactive Problems Resolved Problems Electronic Signature(s) Signed: 03/11/2022 1:10:13 PM By: Worthy Keeler PA-C Entered By: Worthy Keeler on 03/11/2022 13:10:13 [name], [name] ([id number]) -------------------------------------------------------------------------------- Progress Note Details Patient Name: [name] Date of Service: 03/11/2022 12:45 PM Medical Record Number: [id number] Patient Account Number: [id number] Date of Birth/Sex: 1946/07/24 (76 y.o.  M) Treating RN: Cornell Barman Primary Care Provider: Tracie Harrier Other Clinician: Referring Provider: Tracie Harrier Treating Provider/Extender: Skipper Cliche in Treatment: 5 Subjective Chief Complaint Information obtained from Patient Left LE Ulcer History of Present Illness (HPI) 02-01-2022 upon evaluation today patient appears to be doing poorly in regard to his wound. He has been tolerating the dressing changes without complication. With that being said unfortunately he seems to be doing  not so well with regard to his blood flow into this left lower extremity. He has a previous arterial study which showed that he had poor arterial flow with an ABI in 2018 of 0.53 with a TBI of 0.47 in the left leg. In regard to the right leg this was 0.66 for an ABI 0.51 for the TBI. Subsequently it has been obviously about 5 years since that time and things seem to have gotten worse we cannot even palpate nor Doppler a pulse here in the clinic today. Currently patient states that this has been going on for about 3 months in regard to the wound. Fortunately I do not see any evidence that this is worsening. With that being said there is a little bit of evidence of healing which is good news but again he has been using basically just peroxide on the area and really nothing much more. Patient does have a history of noted peripheral vascular disease per above, hypertension, chronic viral hepatitis C, and bipolar disorder. He also apparently has a history of a stroke. 03-11-2022 upon evaluation today patient presents for follow-up he has not been seen here since February 01, 2022. With that being said at that time he tells me that he was having issues with his leg he also had some evidence of not so great arterial studies. With that being said I referred him to vascular that time he still has not seen them. Fortunately I do not see any evidence of active infection locally or systemically which is  great news. Unfortunately he has not completely healed as best I can tell he does have an eschar and dry skin covering but again I do not want to pull on this and cause it to break apart or tear anything that is already healed. Objective Constitutional Well-nourished and well-hydrated in no acute distress. Vitals Time Taken: 12:58 PM, Height: 63 in, Weight: 150 lbs, BMI: 26.6, Temperature: 98.2 F, Pulse: 85 bpm, Respiratory Rate: 16 breaths/min, Blood Pressure: 127/63 mmHg. Respiratory normal breathing without difficulty. Psychiatric this patient is able to make decisions and demonstrates good insight into disease process. Alert and Oriented x 3. pleasant and cooperative. General Notes: Upon inspection patient's wound bed actually showed signs of good granulation and epithelization at this point. Fortunately there does not appear to be any evidence of infection locally or systemically which is great news and overall this is mainly dry and more eschar like. It looks like it is already starting to lift up but I did not want to force this. Integumentary (Hair, Skin) Wound #1 status is Open. Original cause of wound was Trauma. The date acquired was: 10/21/2021. The wound has been in treatment 5 weeks. The wound is located on the Left,Proximal Lower Leg. The wound measures 0.1cm length x 0.1cm width x 0.1cm depth; 0.008cm^2 area and 0.001cm^3 volume. There is Fat Layer (Subcutaneous Tissue) exposed. There is a medium amount of serosanguineous drainage noted. There is medium (34-66%) red granulation within the wound bed. There is a medium (34-66%) amount of necrotic tissue within the wound bed including Adherent Slough. Assessment [name], [name] ([id number]) Active Problems ICD-10 Venous insufficiency (chronic) (peripheral) Non-pressure chronic ulcer of other part of left lower leg with fat layer exposed Other specified peripheral vascular diseases Essential (primary) hypertension Chronic  viral hepatitis C Other bipolar disorder Plan 1. I would recommend currently what we uses Betadine daily and then leave open to air there is really nothing open at this time it could even  be healed underneath but I do not think we could quite get this solved today. 2. I am also can recommend that we have the patient continue to monitor for any signs of worsening or infection. Obviously if anything changes he should let me know but right now I think really the Betadine is probably our best option and then we will see how things go from there. 3. He does have an appointment with vascular for 28 June. He tells me he does not even remember scheduling that. I am not really sure he did this or what is going on but he definitely needs to go for this appointment. We will see patient back for reevaluation in 3 weeks here in the clinic. If anything worsens or changes patient will contact our office for additional recommendations. Electronic Signature(s) Signed: 03/11/2022 1:31:41 PM By: Worthy Keeler PA-C Entered By: Worthy Keeler on 03/11/2022 13:31:41 [name], [name] ([id number]) -------------------------------------------------------------------------------- SuperBill Details Patient Name: [name] Date of Service: 03/11/2022 Medical Record Number: [id number] Patient Account Number: [id number] Date of Birth/Sex: 04/20/1946 (76 y.o. M) Treating RN: Cornell Barman Primary Care Provider: Tracie Harrier Other Clinician: Referring Provider: Tracie Harrier Treating Provider/Extender: Skipper Cliche in Treatment: 5 Diagnosis Coding ICD-10 Codes Code Description I87.2 Venous insufficiency (chronic) (peripheral) L97.822 Non-pressure chronic ulcer of other part of left lower leg with fat layer exposed I73.89 Other specified peripheral vascular diseases I10 Essential (primary) hypertension B18.2 Chronic viral hepatitis C F31.89 Other bipolar disorder Facility Procedures CPT4 Code:  [id number] Description: 99213 - WOUND CARE VISIT-LEV 3 EST PT Modifier: Quantity: 1 Physician Procedures CPT4 Code: [id number] Description: 48185 - WC PHYS LEVEL 3 - EST PT Modifier: Quantity: 1 CPT4 Code: Description: ICD-10 Diagnosis Description I87.2 Venous insufficiency (chronic) (peripheral) L97.822 Non-pressure chronic ulcer of other part of left lower leg with fat lay I73.89 Other specified peripheral vascular diseases I10 Essential (primary)  hypertension Modifier: er exposed Quantity: Electronic Signature(s) Signed: 03/11/2022 4:26:20 PM By: Worthy Keeler PA-C Signed: 03/11/2022 4:29:51 PM By: Alycia Rossetti Previous Signature: 03/11/2022 1:32:00 PM Version By: Worthy Keeler PA-C Entered By: Alycia Rossetti on 03/11/2022 13:35:19

## 2022-04-01 ENCOUNTER — Encounter: Payer: Medicare (Managed Care) | Attending: Physician Assistant | Admitting: Physician Assistant

## 2022-04-01 DIAGNOSIS — B182 Chronic viral hepatitis C: Secondary | ICD-10-CM | POA: Diagnosis not present

## 2022-04-01 DIAGNOSIS — I7389 Other specified peripheral vascular diseases: Secondary | ICD-10-CM | POA: Insufficient documentation

## 2022-04-01 DIAGNOSIS — I10 Essential (primary) hypertension: Secondary | ICD-10-CM | POA: Insufficient documentation

## 2022-04-01 DIAGNOSIS — F17200 Nicotine dependence, unspecified, uncomplicated: Secondary | ICD-10-CM | POA: Insufficient documentation

## 2022-04-01 DIAGNOSIS — F3189 Other bipolar disorder: Secondary | ICD-10-CM | POA: Diagnosis not present

## 2022-04-01 DIAGNOSIS — I872 Venous insufficiency (chronic) (peripheral): Secondary | ICD-10-CM | POA: Diagnosis present

## 2022-04-01 DIAGNOSIS — Z8673 Personal history of transient ischemic attack (TIA), and cerebral infarction without residual deficits: Secondary | ICD-10-CM | POA: Diagnosis not present

## 2022-04-01 DIAGNOSIS — L97822 Non-pressure chronic ulcer of other part of left lower leg with fat layer exposed: Secondary | ICD-10-CM | POA: Insufficient documentation

## 2022-04-01 NOTE — Progress Notes (Addendum)
MURDOCK, JELLISON ([id number]) Visit Report for 04/01/2022 Chief Complaint Document Details Patient Name: [name]. Date of Service: 04/01/2022 10:30 AM Medical Record Number: [id number] Patient Account Number: [id number] Date of Birth/Sex: 1946/03/10 (76 y.o. M) Treating RN: Cornell Barman Primary Care Provider: Tracie Harrier Other Clinician: Referring Provider: Tracie Harrier Treating Provider/Extender: Skipper Cliche in Treatment: 8 Information Obtained from: Patient Chief Complaint Left LE Ulcer Electronic Signature(s) Signed: 04/01/2022 10:19:17 AM By: Worthy Keeler PA-C Entered By: Worthy Keeler on 04/01/2022 10:19:17 [name] ([id number]) -------------------------------------------------------------------------------- Debridement Details Patient Name: [name] Date of Service: 04/01/2022 10:30 AM Medical Record Number: [id number] Patient Account Number: [id number] Date of Birth/Sex: June 16, 1946 (76 y.o. M) Treating RN: Cornell Barman Primary Care Provider: Tracie Harrier Other Clinician: Referring Provider: Tracie Harrier Treating Provider/Extender: Skipper Cliche in Treatment: 8 Debridement Performed for Wound #1 Left,Proximal Lower Leg Assessment: Performed By: Physician Tommie Sams., PA-C Debridement Type: Debridement Severity of Tissue Pre Debridement: Fat layer exposed Level of Consciousness (Pre- Awake and Alert procedure): Pre-procedure Verification/Time Out Yes - 11:02 Taken: Start Time: 11:02 Total Area Debrided (L x W): 0.8 (cm) x 0.3 (cm) = 0.24 (cm) Tissue and other material Viable, Non-Viable, Callus, Slough, Subcutaneous, Slough debrided: Level: Skin/Subcutaneous Tissue Debridement Description: Excisional Instrument: Curette Bleeding: Minimum Hemostasis Achieved: Pressure End Time: 11:04 Response to Treatment: Procedure was tolerated well Level of Consciousness (Post- Awake and Alert procedure): Post Debridement  Measurements of Total Wound Length: (cm) 0.8 Width: (cm) 0.3 Depth: (cm) 0.1 Volume: (cm) 0.019 Character of Wound/Ulcer Post Debridement: Stable Severity of Tissue Post Debridement: Fat layer exposed Post Procedure Diagnosis Same as Pre-procedure Electronic Signature(s) Signed: 04/01/2022 3:46:28 PM By: Worthy Keeler PA-C Signed: 04/01/2022 4:12:58 PM By: Massie Kluver Signed: 04/02/2022 12:11:05 PM By: Gretta Cool, BSN, RN, CWS, Kim RN, BSN Entered By: Massie Kluver on 04/01/2022 11:05:01 [name] ([id number]) -------------------------------------------------------------------------------- HPI Details Patient Name: [name] Date of Service: 04/01/2022 10:30 AM Medical Record Number: [id number] Patient Account Number: [id number] Date of Birth/Sex: 03/31/1946 (76 y.o. M) Treating RN: Cornell Barman Primary Care Provider: Tracie Harrier Other Clinician: Referring Provider: Tracie Harrier Treating Provider/Extender: Skipper Cliche in Treatment: 8 History of Present Illness HPI Description: 02-01-2022 upon evaluation today patient appears to be doing poorly in regard to his wound. He has been tolerating the dressing changes without complication. With that being said unfortunately he seems to be doing not so well with regard to his blood flow into this left lower extremity. He has a previous arterial study which showed that he had poor arterial flow with an ABI in 2018 of 0.53 with a TBI of 0.47 in the left leg. In regard to the right leg this was 0.66 for an ABI 0.51 for the TBI. Subsequently it has been obviously about 5 years since that time and things seem to have gotten worse we cannot even palpate nor Doppler a pulse here in the clinic today. Currently patient states that this has been going on for about 3 months in regard to the wound. Fortunately I do not see any evidence that this is worsening. With that being said there is a little bit of evidence of healing which is  good news but again he has been using basically just peroxide on the area and really nothing much more. Patient does have a history of noted peripheral vascular disease per above, hypertension, chronic viral hepatitis C, and bipolar disorder. He also apparently has a history of  a stroke. 03-11-2022 upon evaluation today patient presents for follow-up he has not been seen here since February 01, 2022. With that being said at that time he tells me that he was having issues with his leg he also had some evidence of not so great arterial studies. With that being said I referred him to vascular that time he still has not seen them. Fortunately I do not see any evidence of active infection locally or systemically which is great news. Unfortunately he has not completely healed as best I can tell he does have an eschar and dry skin covering but again I do not want to pull on this and cause it to break apart or tear anything that is already healed. 04-01-2022 upon evaluation today patient appears to be doing well currently in regard to his wound. He does have some need for sharp debridement today the wound is very dry and it still is getting some pain he tells me that it hurts a lot. this is a tough spot right over the shin. Electronic Signature(s) Signed: 04/01/2022 11:18:11 AM By: Worthy Keeler PA-C Entered By: Worthy Keeler on 04/01/2022 11:18:11 [name] ([id number]) -------------------------------------------------------------------------------- Physical Exam Details Patient Name: [name] Date of Service: 04/01/2022 10:30 AM Medical Record Number: [id number] Patient Account Number: [id number] Date of Birth/Sex: 16-Mar-1946 (76 y.o. M) Treating RN: Cornell Barman Primary Care Provider: Tracie Harrier Other Clinician: Massie Kluver Referring Provider: Tracie Harrier Treating Provider/Extender: Skipper Cliche in Treatment: 8 Constitutional Well-nourished and well-hydrated in no  acute distress. Respiratory normal breathing without difficulty. Psychiatric this patient is able to make decisions and demonstrates good insight into disease process. Alert and Oriented x 3. pleasant and cooperative. Notes Upon inspection patient's wound again I did perform sharp debridement to clear away some of necrotic debris he tolerated that today without complication postdebridement the wound bed appears to be doing much better which is great news. No fevers, chills, nausea, vomiting, or diarrhea. Electronic Signature(s) Signed: 04/01/2022 11:18:33 AM By: Worthy Keeler PA-C Entered By: Worthy Keeler on 04/01/2022 11:18:33 [name] ([id number]) -------------------------------------------------------------------------------- Physician Orders Details Patient Name: [name] Date of Service: 04/01/2022 10:30 AM Medical Record Number: [id number] Patient Account Number: [id number] Date of Birth/Sex: Feb 01, 1946 (76 y.o. M) Treating RN: Cornell Barman Primary Care Provider: Tracie Harrier Other Clinician: Referring Provider: Tracie Harrier Treating Provider/Extender: Skipper Cliche in Treatment: 8 Verbal / Phone Orders: No Diagnosis Coding ICD-10 Coding Code Description I87.2 Venous insufficiency (chronic) (peripheral) L97.822 Non-pressure chronic ulcer of other part of left lower leg with fat layer exposed I73.89 Other specified peripheral vascular diseases I10 Essential (primary) hypertension B18.2 Chronic viral hepatitis C F31.89 Other bipolar disorder Follow-up Appointments o Return Appointment in 3 weeks. Bathing/ Shower/ Hygiene o May shower; gently cleanse wound with antibacterial soap, rinse and pat dry prior to dressing wounds Edema Control - Lymphedema / Segmental Compressive Device / Other o Elevate, Exercise Daily and Avoid Standing for Long Periods of Time. o Elevate legs to the level of the heart and pump ankles as often as possible o  Elevate leg(s) parallel to the floor when sitting. Wound Treatment Wound #1 - Lower Leg Wound Laterality: Left, Proximal Cleanser: Soap and Water 3 x Per Week/30 Days Discharge Instructions: Gently cleanse wound with antibacterial soap, rinse and pat dry prior to dressing wounds Primary Dressing: Xeroform-HBD 2x2 (in/in) (DME) (Generic) 3 x Per Week/30 Days Discharge Instructions: Apply Xeroform-HBD 2x2 (in/in) as directed Secondary Dressing: (  SILICONE BORDER) Zetuvit Plus SILICONE BORDER Dressing 4x4 (in/in) (DME) (Generic) 3 x Per Week/30 Days Discharge Instructions: Please do not put silicone bordered dressings under wraps. Use non-bordered dressing only. Electronic Signature(s) Signed: 04/01/2022 3:46:28 PM By: Worthy Keeler PA-C Signed: 04/01/2022 4:12:58 PM By: Massie Kluver Entered By: Massie Kluver on 04/01/2022 11:09:36 [name] ([id number]) -------------------------------------------------------------------------------- Problem List Details Patient Name: ANTAEUS, KAREL Date of Service: 04/01/2022 10:30 AM Medical Record Number: [id number] Patient Account Number: [id number] Date of Birth/Sex: Jan 05, 1946 (76 y.o. M) Treating RN: Cornell Barman Primary Care Provider: Tracie Harrier Other Clinician: Referring Provider: Tracie Harrier Treating Provider/Extender: Skipper Cliche in Treatment: 8 Active Problems ICD-10 Encounter Code Description Active Date MDM Diagnosis I87.2 Venous insufficiency (chronic) (peripheral) 02/01/2022 No Yes L97.822 Non-pressure chronic ulcer of other part of left lower leg with fat layer 02/01/2022 No Yes exposed I73.89 Other specified peripheral vascular diseases 02/01/2022 No Yes I10 Essential (primary) hypertension 02/01/2022 No Yes B18.2 Chronic viral hepatitis C 02/01/2022 No Yes F31.89 Other bipolar disorder 02/01/2022 No Yes Inactive Problems Resolved Problems Electronic Signature(s) Signed: 04/01/2022 10:19:12 AM By: Worthy Keeler  PA-C Entered By: Worthy Keeler on 04/01/2022 10:19:12 [name] ([id number]) -------------------------------------------------------------------------------- Progress Note Details Patient Name: [name] Date of Service: 04/01/2022 10:30 AM Medical Record Number: [id number] Patient Account Number: [id number] Date of Birth/Sex: [birth year] (76 y.o. M) Treating RN: Cornell Barman Primary Care Provider: Tracie Harrier Other Clinician: Massie Kluver Referring Provider: Tracie Harrier Treating Provider/Extender: Skipper Cliche in Treatment: 8 Subjective Chief Complaint Information obtained from Patient Left LE Ulcer History of Present Illness (HPI) 02-01-2022 upon evaluation today patient appears to be doing poorly in regard to his wound. He has been tolerating the dressing changes without complication. With that being said unfortunately he seems to be doing not so well with regard to his blood flow into this left lower extremity. He has a previous arterial study which showed that he had poor arterial flow with an ABI in 2018 of 0.53 with a TBI of 0.47 in the left leg. In regard to the right leg this was 0.66 for an ABI 0.51 for the TBI. Subsequently it has been obviously about 5 years since that time and things seem to have gotten worse we cannot even palpate nor Doppler a pulse here in the clinic today. Currently patient states that this has been going on for about 3 months in regard to the wound. Fortunately I do not see any evidence that this is worsening. With that being said there is a little bit of evidence of healing which is good news but again he has been using basically just peroxide on the area and really nothing much more. Patient does have a history of noted peripheral vascular disease per above, hypertension, chronic viral hepatitis C, and bipolar disorder. He also apparently has a history of a stroke. 03-11-2022 upon evaluation today patient presents for  follow-up he has not been seen here since February 01, 2022. With that being said at that time he tells me that he was having issues with his leg he also had some evidence of not so great arterial studies. With that being said I referred him to vascular that time he still has not seen them. Fortunately I do not see any evidence of active infection locally or systemically which is great news. Unfortunately he has not completely healed as best I can tell he does have an eschar and dry skin covering but again I  do not want to pull on this and cause it to break apart or tear anything that is already healed. 04-01-2022 upon evaluation today patient appears to be doing well currently in regard to his wound. He does have some need for sharp debridement today the wound is very dry and it still is getting some pain he tells me that it hurts a lot. this is a tough spot right over the shin. Objective Constitutional Well-nourished and well-hydrated in no acute distress. Vitals Time Taken: 10:51 AM, Height: 63 in, Weight: 150 lbs, BMI: 26.6, Temperature: 98.2 F, Pulse: 79 bpm, Respiratory Rate: 16 breaths/min, Blood Pressure: 144/78 mmHg. Respiratory normal breathing without difficulty. Psychiatric this patient is able to make decisions and demonstrates good insight into disease process. Alert and Oriented x 3. pleasant and cooperative. General Notes: Upon inspection patient's wound again I did perform sharp debridement to clear away some of necrotic debris he tolerated that today without complication postdebridement the wound bed appears to be doing much better which is great news. No fevers, chills, nausea, vomiting, or diarrhea. Integumentary (Hair, Skin) Wound #1 status is Open. Original cause of wound was Trauma. The date acquired was: 10/21/2021. The wound has been in treatment 8 weeks. The wound is located on the Left,Proximal Lower Leg. The wound measures 0.1cm length x 0.1cm width x 0.1cm depth;  0.008cm^2 area and 0.001cm^3 volume. There is Fat Layer (Subcutaneous Tissue) exposed. There is a medium amount of serosanguineous drainage noted. There is medium (34-66%) red granulation within the wound bed. There is a medium (34-66%) amount of necrotic tissue within the wound bed including Adherent Slough. JOSHUAN, BOLANDER ([id number]) Assessment Active Problems ICD-10 Venous insufficiency (chronic) (peripheral) Non-pressure chronic ulcer of other part of left lower leg with fat layer exposed Other specified peripheral vascular diseases Essential (primary) hypertension Chronic viral hepatitis C Other bipolar disorder Procedures Wound #1 Pre-procedure diagnosis of Wound #1 is a Venous Leg Ulcer located on the Left,Proximal Lower Leg .Severity of Tissue Pre Debridement is: Fat layer exposed. There was a Excisional Skin/Subcutaneous Tissue Debridement with a total area of 0.24 sq cm performed by Tommie Sams., PA-C. With the following instrument(s): Curette to remove Viable and Non-Viable tissue/material. Material removed includes Callus, Subcutaneous Tissue, and Slough. A time out was conducted at 11:02, prior to the start of the procedure. A Minimum amount of bleeding was controlled with Pressure. The procedure was tolerated well. Post Debridement Measurements: 0.8cm length x 0.3cm width x 0.1cm depth; 0.019cm^3 volume. Character of Wound/Ulcer Post Debridement is stable. Severity of Tissue Post Debridement is: Fat layer exposed. Post procedure Diagnosis Wound #1: Same as Pre-Procedure Plan Follow-up Appointments: Return Appointment in 3 weeks. Bathing/ Shower/ Hygiene: May shower; gently cleanse wound with antibacterial soap, rinse and pat dry prior to dressing wounds Edema Control - Lymphedema / Segmental Compressive Device / Other: Elevate, Exercise Daily and Avoid Standing for Long Periods of Time. Elevate legs to the level of the heart and pump ankles as often as  possible Elevate leg(s) parallel to the floor when sitting. WOUND #1: - Lower Leg Wound Laterality: Left, Proximal Cleanser: Soap and Water 3 x Per Week/30 Days Discharge Instructions: Gently cleanse wound with antibacterial soap, rinse and pat dry prior to dressing wounds Primary Dressing: Xeroform-HBD 2x2 (in/in) (DME) (Generic) 3 x Per Week/30 Days Discharge Instructions: Apply Xeroform-HBD 2x2 (in/in) as directed Secondary Dressing: (SILICONE BORDER) Zetuvit Plus SILICONE BORDER Dressing 4x4 (in/in) (DME) (Generic) 3 x Per Week/30 Days Discharge Instructions: Please  do not put silicone bordered dressings under wraps. Use non-bordered dressing only. 1. I am going to recommend that we go ahead and continue with the wound care measures as before and the patient is in agreement with plan. This includes the use of the Xeroform gauze dressing and a border foam dressing to cover I think this is good to be the best way to go. 2. Also, recommend that we have the patient continue with the dressing changes 3 times per week which I think is appropriate. I think this should hopefully help get this healed and finally done. We will see patient back for reevaluation in 1 week here in the clinic. If anything worsens or changes patient will contact our office for additional recommendations. Electronic Signature(s) Signed: 04/01/2022 11:19:08 AM By: Worthy Keeler PA-C Entered By: Worthy Keeler on 04/01/2022 11:19:08 [name] ([id number]) -------------------------------------------------------------------------------- SuperBill Details Patient Name: [name] Date of Service: 04/01/2022 Medical Record Number: [id number] Patient Account Number: [id number] Date of Birth/Sex: [birth year] (75 y.o. M) Treating RN: Cornell Barman Primary Care Provider: Tracie Harrier Other Clinician: Massie Kluver Referring Provider: Tracie Harrier Treating Provider/Extender: Skipper Cliche in Treatment:  8 Diagnosis Coding ICD-10 Codes Code Description I87.2 Venous insufficiency (chronic) (peripheral) L97.822 Non-pressure chronic ulcer of other part of left lower leg with fat layer exposed I73.89 Other specified peripheral vascular diseases I10 Essential (primary) hypertension B18.2 Chronic viral hepatitis C F31.89 Other bipolar disorder Facility Procedures CPT4 Code: [id number] Description: 83094 - DEB SUBQ TISSUE 20 SQ CM/< Modifier: Quantity: 1 CPT4 Code: Description: ICD-10 Diagnosis Description L97.822 Non-pressure chronic ulcer of other part of left lower leg with fat layer Modifier: exposed Quantity: Physician Procedures CPT4 Code: [id number] Description: 11042 - WC PHYS SUBQ TISS 20 SQ CM Modifier: Quantity: 1 CPT4 Code: Description: ICD-10 Diagnosis Description L97.822 Non-pressure chronic ulcer of other part of left lower leg with fat layer Modifier: exposed Quantity: Electronic Signature(s) Signed: 04/01/2022 11:19:38 AM By: Worthy Keeler PA-C Entered By: Worthy Keeler on 04/01/2022 11:19:38

## 2022-04-01 NOTE — Progress Notes (Addendum)
KARANVIR, BALDERSTON ([id number]) Visit Report for 04/01/2022 Arrival Information Details Patient Name: [name], [name]. Date of Service: 04/01/2022 10:30 AM Medical Record Number: [id number] Patient Account Number: [id number] Date of Birth/Sex: 19-Nov-1945 (76 y.o. M) Treating RN: Cornell Barman Primary Care Jacaria Colburn: Tracie Harrier Other Clinician: Referring Alfard Cochrane: Tracie Harrier Treating Gertrude Bucks/Extender: Skipper Cliche in Treatment: 8 Visit Information History Since Last Visit All ordered tests and consults were completed: No Patient Arrived: Ambulatory Added or deleted any medications: No Arrival Time: 10:47 Any new allergies or adverse reactions: No Transfer Assistance: None Had a fall or experienced change in No Patient Requires Transmission-Based No activities of daily living that may affect Precautions: risk of falls: Patient Has Alerts: Yes Hospitalized since last visit: No Patient Alerts: Patient on Blood Pain Present Now: Yes Thinner Electronic Signature(s) Signed: 04/01/2022 4:12:58 PM By: Massie Kluver Entered By: Massie Kluver on 04/01/2022 10:51:46 Laura, Hal Hope ([id number]) -------------------------------------------------------------------------------- Clinic Level of Care Assessment Details Patient Name: [name] Date of Service: 04/01/2022 10:30 AM Medical Record Number: [id number] Patient Account Number: [id number] Date of Birth/Sex: 12-26-1945 (76 y.o. M) Treating RN: Cornell Barman Primary Care Philemon Riedesel: Tracie Harrier Other Clinician: Referring Chezney Huether: Tracie Harrier Treating Analleli Gierke/Extender: Skipper Cliche in Treatment: 8 Clinic Level of Care Assessment Items TOOL 1 Quantity Score '[]'$  - Use when EandM and Procedure is performed on INITIAL visit 0 ASSESSMENTS - Nursing Assessment / Reassessment '[]'$  - General Physical Exam (combine w/ comprehensive assessment (listed just below) when performed on new 0 pt. evals) '[]'$  -  0 Comprehensive Assessment (HX, ROS, Risk Assessments, Wounds Hx, etc.) ASSESSMENTS - Wound and Skin Assessment / Reassessment '[]'$  - Dermatologic / Skin Assessment (not related to wound area) 0 ASSESSMENTS - Ostomy and/or Continence Assessment and Care '[]'$  - Incontinence Assessment and Management 0 '[]'$  - 0 Ostomy Care Assessment and Management (repouching, etc.) PROCESS - Coordination of Care '[]'$  - Simple Patient / Family Education for ongoing care 0 '[]'$  - 0 Complex (extensive) Patient / Family Education for ongoing care '[]'$  - 0 Staff obtains Programmer, systems, Records, Test Results / Process Orders '[]'$  - 0 Staff telephones HHA, Nursing Homes / Clarify orders / etc '[]'$  - 0 Routine Transfer to another Facility (non-emergent condition) '[]'$  - 0 Routine Hospital Admission (non-emergent condition) '[]'$  - 0 New Admissions / Biomedical engineer / Ordering NPWT, Apligraf, etc. '[]'$  - 0 Emergency Hospital Admission (emergent condition) PROCESS - Special Needs '[]'$  - Pediatric / Minor Patient Management 0 '[]'$  - 0 Isolation Patient Management '[]'$  - 0 Hearing / Language / Visual special needs '[]'$  - 0 Assessment of Community assistance (transportation, D/C planning, etc.) '[]'$  - 0 Additional assistance / Altered mentation '[]'$  - 0 Support Surface(s) Assessment (bed, cushion, seat, etc.) INTERVENTIONS - Miscellaneous '[]'$  - External ear exam 0 '[]'$  - 0 Patient Transfer (multiple staff / Civil Service fast streamer / Similar devices) '[]'$  - 0 Simple Staple / Suture removal (25 or less) '[]'$  - 0 Complex Staple / Suture removal (26 or more) '[]'$  - 0 Hypo/Hyperglycemic Management (do not check if billed separately) '[]'$  - 0 Ankle / Brachial Index (ABI) - do not check if billed separately Has the patient been seen at the hospital within the last three years: Yes Total Score: 0 Level Of Care: ____ [name] ([id number]) Electronic Signature(s) Signed: 04/01/2022 4:12:58 PM By: Massie Kluver Entered By: Massie Kluver on 04/01/2022  11:14:28 [name] ([id number]) -------------------------------------------------------------------------------- Encounter Discharge Information Details Patient Name: [name] Date of Service: 04/01/2022 10:30  AM Medical Record Number: [id number] Patient Account Number: [id number] Date of Birth/Sex: 1946/03/19 (76 y.o. M) Treating RN: Cornell Barman Primary Care Tomas Schamp: Tracie Harrier Other Clinician: Massie Kluver Referring Harlis Champoux: Tracie Harrier Treating Breelle Hollywood/Extender: Skipper Cliche in Treatment: 8 Encounter Discharge Information Items Post Procedure Vitals Discharge Condition: Stable Temperature (F): 98.2 Ambulatory Status: Ambulatory Pulse (bpm): 79 Discharge Destination: Home Respiratory Rate (breaths/min): 16 Transportation: Private Auto Blood Pressure (mmHg): 144/78 Accompanied By: self Schedule Follow-up Appointment: Yes Clinical Summary of Care: Electronic Signature(s) Signed: 04/01/2022 4:12:58 PM By: Massie Kluver Entered By: Massie Kluver on 04/01/2022 11:16:21 [name] ([id number]) -------------------------------------------------------------------------------- Lower Extremity Assessment Details Patient Name: [name] Date of Service: 04/01/2022 10:30 AM Medical Record Number: [id number] Patient Account Number: [id number] Date of Birth/Sex: Nov 06, 1945 (76 y.o. M) Treating RN: Cornell Barman Primary Care Mattheo Swindle: Tracie Harrier Other Clinician: Referring Navy Rothschild: Tracie Harrier Treating Amarianna Abplanalp/Extender: Jeri Cos Weeks in Treatment: 8 Edema Assessment Assessed: [Left: Yes] [Right: No] Edema: [Left: N] [Right: o] Vascular Assessment Pulses: Dorsalis Pedis Palpable: [Left:Yes] Electronic Signature(s) Signed: 04/01/2022 4:12:58 PM By: Massie Kluver Signed: 04/02/2022 12:11:05 PM By: Gretta Cool, BSN, RN, CWS, Kim RN, BSN Entered By: Massie Kluver on 04/01/2022 10:55:29 DERECK, AGERTON  ([id number]) -------------------------------------------------------------------------------- Multi Wound Chart Details Patient Name: YOSGART, PAVEY. Date of Service: 04/01/2022 10:30 AM Medical Record Number: [id number] Patient Account Number: [id number] Date of Birth/Sex: 06/02/1946 (76 y.o. M) Treating RN: Cornell Barman Primary Care Kindel Rochefort: Tracie Harrier Other Clinician: Referring Lamyiah Crawshaw: Tracie Harrier Treating Farrin Shadle/Extender: Skipper Cliche in Treatment: 8 Vital Signs Height(in): 63 Pulse(bpm): 79 Weight(lbs): 150 Blood Pressure(mmHg): 144/78 Body Mass Index(BMI): 26.6 Temperature(F): 98.2 Respiratory Rate(breaths/min): 16 Photos: [N/A:N/A] Wound Location: Left, Proximal Lower Leg N/A N/A Wounding Event: Trauma N/A N/A Primary Etiology: Venous Leg Ulcer N/A N/A Comorbid History: Hypertension, Peripheral Arterial N/A N/A Disease Date Acquired: 10/21/2021 N/A N/A Weeks of Treatment: 8 N/A N/A Wound Status: Open N/A N/A Wound Recurrence: No N/A N/A Measurements L x W x D (cm) 0.1x0.1x0.1 N/A N/A Area (cm) : 0.008 N/A N/A Volume (cm) : 0.001 N/A N/A % Reduction in Area: 99.70% N/A N/A % Reduction in Volume: 99.90% N/A N/A Classification: Full Thickness Without Exposed N/A N/A Support Structures Exudate Amount: Medium N/A N/A Exudate Type: Serosanguineous N/A N/A Exudate Color: red, brown N/A N/A Granulation Amount: Medium (34-66%) N/A N/A Granulation Quality: Red N/A N/A Necrotic Amount: Medium (34-66%) N/A N/A Exposed Structures: Fat Layer (Subcutaneous Tissue): N/A N/A Yes Fascia: No Tendon: No Muscle: No Joint: No Bone: No Epithelialization: None N/A N/A Treatment Notes Electronic Signature(s) Signed: 04/01/2022 4:12:58 PM By: Massie Kluver Entered By: Massie Kluver on 04/01/2022 11:00:29 [name] ([id number]) -------------------------------------------------------------------------------- [place] Details Patient  Name: [name] Date of Service: 04/01/2022 10:30 AM Medical Record Number: [id number] Patient Account Number: [id number] Date of Birth/Sex: 04/10/1946 (76 y.o. M) Treating RN: Cornell Barman Primary Care Archana Eckman: Tracie Harrier Other Clinician: Referring Shelvy Heckert: Tracie Harrier Treating Maanvi Lecompte/Extender: Skipper Cliche in Treatment: 8 Active Inactive Electronic Signature(s) Signed: 05/03/2022 8:34:59 AM By: Gretta Cool, BSN, RN, CWS, Kim RN, BSN Previous Signature: 04/01/2022 4:12:58 PM Version By: Massie Kluver Previous Signature: 04/02/2022 12:11:05 PM Version By: Gretta Cool, BSN, RN, CWS, Kim RN, BSN Entered By: Gretta Cool, BSN, RN, CWS, Kim on 05/03/2022 08:34:59 [name] ([id number]) -------------------------------------------------------------------------------- Pain Assessment Details Patient Name: GRABIEL, SCHMUTZ. Date of Service: 04/01/2022 10:30 AM Medical Record Number: [id number] Patient Account Number: [id number] Date of Birth/Sex: 1946-05-30 (76 y.o. M) Treating RN: Gretta Cool,  [place] Primary Care Jaylin Benzel: Tracie Harrier Other Clinician: Referring Jaleeyah Munce: Tracie Harrier Treating Meriam Chojnowski/Extender: Skipper Cliche in Treatment: 8 Active Problems Location of Pain Severity and Description of Pain Patient Has Paino Yes Site Locations Pain Location: Pain in Ulcers Duration of the Pain. Constant / Intermittento Constant Rate the pain. Current Pain Level: 8 Character of Pain Describe the Pain: Burning Pain Management and Medication Current Pain Management: Medication: Yes Rest: Yes Electronic Signature(s) Signed: 04/01/2022 4:12:58 PM By: Massie Kluver Signed: 04/02/2022 12:11:05 PM By: Gretta Cool, BSN, RN, CWS, Kim RN, BSN Entered By: Massie Kluver on 04/01/2022 10:53:44 [name] ([id number]) -------------------------------------------------------------------------------- Patient/Caregiver Education Details Patient Name: [name] Date of Service:  04/01/2022 10:30 AM Medical Record Number: [id number] Patient Account Number: [id number] Date of Birth/Gender: 1945/12/18 (76 y.o. M) Treating RN: Cornell Barman Primary Care Physician: Tracie Harrier Other Clinician: Referring Physician: Tracie Harrier Treating Physician/Extender: Skipper Cliche in Treatment: 8 Education Assessment Education Provided To: Patient Education Topics Provided Wound/Skin Impairment: Handouts: Other: continue with wound care as directed Electronic Signature(s) Signed: 04/01/2022 4:12:58 PM By: Massie Kluver Entered By: Massie Kluver on 04/01/2022 11:15:15 Ringley, Hal Hope ([id number]) -------------------------------------------------------------------------------- Wound Assessment Details Patient Name: [name] Date of Service: 04/01/2022 10:30 AM Medical Record Number: [id number] Patient Account Number: [id number] Date of Birth/Sex: [birth year] (76 y.o. M) Treating RN: Cornell Barman Primary Care Phynix Horton: Tracie Harrier Other Clinician: Referring Glenisha Gundry: Tracie Harrier Treating Danicia Terhaar/Extender: Skipper Cliche in Treatment: 8 Wound Status Wound Number: 1 Primary Etiology: Venous Leg Ulcer Wound Location: Left, Proximal Lower Leg Wound Status: Open Wounding Event: Trauma Comorbid History: Hypertension, Peripheral Arterial Disease Date Acquired: 10/21/2021 Weeks Of Treatment: 8 Clustered Wound: No Photos Wound Measurements Length: (cm) 0.1 Width: (cm) 0.1 Depth: (cm) 0.1 Area: (cm) 0.008 Volume: (cm) 0.001 % Reduction in Area: 99.7% % Reduction in Volume: 99.9% Epithelialization: None Wound Description Classification: Full Thickness Without Exposed Support Structures Exudate Amount: Medium Exudate Type: Serosanguineous Exudate Color: red, brown Foul Odor After Cleansing: No Slough/Fibrino Yes Wound Bed Granulation Amount: Medium (34-66%) Exposed Structure Granulation Quality: Red Fascia Exposed: No Necrotic Amount:  Medium (34-66%) Fat Layer (Subcutaneous Tissue) Exposed: Yes Necrotic Quality: Adherent Slough Tendon Exposed: No Muscle Exposed: No Joint Exposed: No Bone Exposed: No Electronic Signature(s) Signed: 04/01/2022 4:12:58 PM By: Massie Kluver Signed: 04/02/2022 12:11:05 PM By: Gretta Cool, BSN, RN, CWS, Kim RN, BSN Entered By: Massie Kluver on 04/01/2022 10:55:06 [name] ([id number]) -------------------------------------------------------------------------------- Vitals Details Patient Name: [name] Date of Service: 04/01/2022 10:30 AM Medical Record Number: [id number] Patient Account Number: [id number] Date of Birth/Sex: 12-22-1945 (76 y.o. M) Treating RN: Cornell Barman Primary Care Doyle Kunath: Tracie Harrier Other Clinician: Referring Tiffiany Beadles: Tracie Harrier Treating Rody Keadle/Extender: Skipper Cliche in Treatment: 8 Vital Signs Time Taken: 10:51 Temperature (F): 98.2 Height (in): 63 Pulse (bpm): 79 Weight (lbs): 150 Respiratory Rate (breaths/min): 16 Body Mass Index (BMI): 26.6 Blood Pressure (mmHg): 144/78 Reference Range: 80 - 120 mg / dl Electronic Signature(s) Signed: 04/01/2022 4:12:58 PM By: Massie Kluver Entered By: Massie Kluver on 04/01/2022 10:52:35

## 2022-04-15 ENCOUNTER — Ambulatory Visit: Payer: Medicare (Managed Care) | Admitting: Physician Assistant

## 2022-04-17 ENCOUNTER — Encounter (INDEPENDENT_AMBULATORY_CARE_PROVIDER_SITE_OTHER): Payer: Medicare HMO

## 2022-04-17 ENCOUNTER — Encounter (INDEPENDENT_AMBULATORY_CARE_PROVIDER_SITE_OTHER): Payer: Medicare HMO | Admitting: Nurse Practitioner

## 2022-04-17 ENCOUNTER — Other Ambulatory Visit (INDEPENDENT_AMBULATORY_CARE_PROVIDER_SITE_OTHER): Payer: Self-pay | Admitting: Nurse Practitioner

## 2022-04-17 DIAGNOSIS — R6889 Other general symptoms and signs: Secondary | ICD-10-CM

## 2022-05-30 ENCOUNTER — Ambulatory Visit (INDEPENDENT_AMBULATORY_CARE_PROVIDER_SITE_OTHER): Payer: Medicare (Managed Care) | Admitting: Physician Assistant

## 2022-05-30 VITALS — BP 144/80 | HR 90 | Ht 63.0 in | Wt 147.0 lb

## 2022-05-30 DIAGNOSIS — N3001 Acute cystitis with hematuria: Secondary | ICD-10-CM

## 2022-05-30 DIAGNOSIS — R310 Gross hematuria: Secondary | ICD-10-CM

## 2022-05-30 LAB — MICROSCOPIC EXAMINATION: RBC, Urine: >30 /hpf — AB (ref 0–2)

## 2022-05-30 LAB — URINALYSIS, COMPLETE
Bilirubin, UA: NEGATIVE
Glucose, UA: NEGATIVE
Ketones, UA: NEGATIVE
Nitrite, UA: NEGATIVE
Specific Gravity, UA: 1.025 (ref 1.005–1.030)
Urobilinogen, Ur: 0.2 mg/dL (ref 0.2–1.0)
pH, UA: 7 (ref 5.0–7.5)

## 2022-05-30 LAB — BLADDER SCAN AMB NON-IMAGING

## 2022-05-30 MED ORDER — SULFAMETHOXAZOLE-TRIMETHOPRIM 800-160 MG PO TABS: 1.0000 | ORAL_TABLET | Freq: Two times a day (BID) | ORAL | 0 refills | Status: AC

## 2022-05-30 MED ORDER — CEFTRIAXONE SODIUM 1 G IJ SOLR
1.0000 g | Freq: Once | INTRAMUSCULAR | Status: AC
  Administered 2022-05-30: 1 g via INTRAMUSCULAR

## 2022-05-30 MED ORDER — PHENAZOPYRIDINE HCL 200 MG PO TABS: 200.0000 mg | ORAL_TABLET | Freq: Three times a day (TID) | ORAL | 0 refills | Status: DC | PRN

## 2022-05-30 NOTE — Progress Notes (Signed)
IM Injection  Patient is present today for an IM Injection for treatment of acute cystitis w hematuria. Drug: Ceftriaxone  Dose:1g Location: Right upper outer quadrant Lot: [id number] Exp:11/2023 Patient tolerated well, no complications were noted  Performed by: Bradly Bienenstock CMA

## 2022-05-30 NOTE — Progress Notes (Signed)
05/30/2022 4:23 PM   [name] 04/10/1946 [id number]  CC: Chief Complaint  Patient presents with   Hematuria   HPI: [name] is a 76 y.o. male with PMH BPH with LUTS s/p UroLift with Dr. Bernardo Heater in 2021 and nephrolithiasis who presents today for evaluation of possible acute stone episode.   Today he reports a 4-day history of gross hematuria with passage of small clots associated with lower abdominal pain described as throbbing and 8/10 in intensity.  He denies fever, chills, nausea, vomiting.  He has chronic midline low back pain which is stable.  He states he periodically has similar symptoms that he attributes to passing a stone.  He notes episodes of painless gross hematuria occurring approximately every 2 months.  He has an approximate 60-pack-year smoking history.  In-office UA today positive for 3+ blood, 1+ protein, and trace leukocytes; urine microscopy with 11-30 WBCs/HPF, >30 RBCs/HPF, and moderate bacteria. PVR 30m.  PMH: Past Medical History:  Diagnosis Date   Anxiety    Arthritis    DDD, lumbar radiculopathy   Bell's palsy    hx of, right side   Bell's palsy    Chronic back pain    followed by pain clinic at [facility]   Depression    hx of depression   GERD (gastroesophageal reflux disease)    Hepatitis    hx of hepatitis C treated with Harvoni per patient   History of kidney stones    Hypertension    Kidney stone    hx of   Pre-diabetes    Stroke ([facility] 02[place]   loss of peripheral vision and balance unsteady   [place]   Unresponsive episode 02/07/2022   Wears dentures    Has full upper.  Does not wear.    Surgical History: Past Surgical History:  Procedure Laterality Date   ANTERIOR LUMBAR FUSION  05/13/2012   Procedure: ANTERIOR LUMBAR FUSION 1 LEVEL;  Surgeon: DMelina Schools MD;  [facility]  Service: Orthopedics;  Laterality: N/A;  ALIF L5-S1   CATARACT EXTRACTION W/PHACO Left 01/01/2021   Procedure: CATARACT  EXTRACTION PHACO AND INTRAOCULAR LENS PLACEMENT (IOC) LEFT DIABETIC 2.28 00:24.7;  Surgeon: KEulogio Bear MD;  [facility]: [facility]  Service: Ophthalmology;  Laterality: Left;  covid + 11-30-20   CATARACT EXTRACTION W/PHACO Right 01/15/2021   Procedure: CATARACT EXTRACTION PHACO AND INTRAOCULAR LENS PLACEMENT ([place] RIGHT DIABETIC;  Surgeon: KEulogio Bear MD;  [place] [facility]  Service: Ophthalmology;  Laterality: Right;  2.62 0:34.5   CHOLECYSTECTOMY     COLONOSCOPY WITH PROPOFOL N/A 09/19/2021   Procedure: COLONOSCOPY WITH PROPOFOL;  Surgeon: Tol[place], TBenay Pike MD;  Location: [facility];  Service: Gastroenterology;  Laterality: N/A;   CYSTOSCOPY W/ RETROGRADES Left 08/04/2018   Procedure: CYSTOSCOPY WITH RETROGRADE PYELOGRAM;  Surgeon: SAbbie Sons MD;  Location: [facility];  Service: Urology;  Laterality: Left;   CYSTOSCOPY WITH INSERTION OF UROLIFT N/A 01/18/2020   Procedure: CYSTOSCOPY WITH INSERTION OF UROLIFT;  Surgeon: SAbbie Sons MD;  Location: [facility];  Service: Urology;  Laterality: N/A;   CYSTOSCOPY WITH STENT PLACEMENT Left 08/04/2018   Procedure: CYSTOSCOPY WITH STENT PLACEMENT;  Surgeon: SAbbie Sons MD;  Location: [facility];  Service: Urology;  Laterality: Left;   EXTRACORPOREAL SHOCK WAVE LITHOTRIPSY  2012   EXTRACORPOREAL SHOCK WAVE LITHOTRIPSY Left 08/20/2018   Procedure: EXTRACORPOREAL SHOCK WAVE LITHOTRIPSY (ESWL);  Surgeon: BHollice Espy MD;  Location:  [facility];  Service: Urology;  Laterality: Left; (cancelled)   EXTRACORPOREAL SHOCK WAVE LITHOTRIPSY Left 08/27/2018   Procedure: EXTRACORPOREAL SHOCK WAVE LITHOTRIPSY (ESWL);  Surgeon: Abbie Sons, MD;  Location: [facility];  Service: Urology;  Laterality: Left;   EXTRACORPOREAL SHOCK WAVE LITHOTRIPSY Right 10/15/2018   Procedure: EXTRACORPOREAL SHOCK WAVE LITHOTRIPSY (ESWL);  Surgeon: Billey Co, MD;  Location: [facility];  Service: Urology;  Laterality: Right;    KIDNEY STONE SURGERY     LUMBAR FUSION  05/13/2012    Home Medications:  Allergies as of 05/30/2022   No Known Allergies      Medication List        Accurate as of May 30, 2022  4:23 PM. If you have any questions, ask your nurse or doctor.          acetaminophen 325 MG tablet Commonly known as: TYLENOL Take 2 tablets (650 mg total) by mouth every 6 (six) hours as needed for mild pain (or Fever >/= 101).   amLODipine 2.5 MG tablet Commonly known as: NORVASC Take 2.5 mg by mouth daily.   aspirin EC 81 MG tablet Take 1 tablet (81 mg total) by mouth daily. Swallow whole.   atorvastatin 40 MG tablet Commonly known as: LIPITOR Take 40 mg by mouth daily.   baclofen 10 MG tablet Commonly known as: LIORESAL Take 10 mg by mouth 3 (three) times daily.   clopidogrel 75 MG tablet Commonly known as: PLAVIX Take 75 mg by mouth daily.   [employer] D3 25 MCG (1000 UT) capsule Generic drug: Cholecalciferol Take 2,000 Units by mouth daily.   escitalopram 10 MG tablet Commonly known as: LEXAPRO Take 10 mg by mouth daily.   feeding supplement Liqd Take 237 mLs by mouth 2 (two) times daily between meals.   folic acid 1 MG tablet Commonly known as: FOLVITE Take 1 tablet (1 mg total) by mouth daily.   furosemide 20 MG tablet Commonly known as: LASIX Take 20 mg by mouth daily.   hydrochlorothiazide 12.5 MG tablet Commonly known as: HYDRODIURIL Take 12.5 mg by mouth daily.   ibuprofen 800 MG tablet Commonly known as: ADVIL Take 1 tablet (800 mg total) by mouth every 8 (eight) hours as needed.   meclizine 25 MG tablet Commonly known as: ANTIVERT Take 25 mg by mouth 3 (three) times daily as needed for dizziness.   melatonin 5 MG Tabs Take 1 tablet (5 mg total) by mouth at bedtime.   nicotine 21 mg/24hr patch Commonly known as: NICODERM CQ - dosed in mg/24 hours Place 1 patch (21 mg total) onto the skin daily.   phenazopyridine 200 MG tablet Commonly known as:  Pyridium Take 1 tablet (200 mg total) by mouth 3 (three) times daily as needed for pain. Started by: Debroah Loop, PA-C   QUEtiapine 300 MG tablet Commonly known as: SEROQUEL Take 300 mg by mouth at bedtime.   risperiDONE 0.5 MG tablet Commonly known as: RISPERDAL Take 0.5 mg by mouth daily.   sulfamethoxazole-trimethoprim 800-160 MG tablet Commonly known as: BACTRIM DS Take 1 tablet by mouth 2 (two) times daily for 6 days. Started by: Debroah Loop, PA-C   tamsulosin 0.4 MG Caps capsule Commonly known as: FLOMAX Take 0.4 mg by mouth daily.   traZODone 50 MG tablet Commonly known as: DESYREL Take 50 mg by mouth at bedtime.        Allergies:  No Known Allergies  Family History: Family History  Problem Relation Age of Onset  Stroke Mother    Diabetes Mother    Lung cancer Father    Diabetes Father     Social History:   reports that he has been smoking cigarettes. He has a 13.25 pack-year smoking history. He has never used smokeless tobacco. He reports that he does not currently use drugs after having used the following drugs: Heroin, Cocaine, and Marijuana. He reports that he does not drink alcohol.  Physical Exam: BP (!) 144/80   Pulse 90   Ht '5\' 3"'$  (1.6 m)   Wt 147 lb (66.7 kg)   BMI 26.04 kg/m   Constitutional:  Alert and oriented, no acute distress, nontoxic appearing HEENT: [place], AT Cardiovascular: No clubbing, cyanosis, or edema Respiratory: Normal respiratory effort, no increased work of breathing Skin: No rashes, bruises or suspicious lesions Neurologic: Grossly intact, no focal deficits, moving all 4 extremities Psychiatric: Normal mood and affect  Laboratory Data: Results for orders placed or performed in visit on 05/30/22  Microscopic Examination   Urine  Result Value Ref Range   WBC, UA 11-30 (A) 0 - 5 /hpf   RBC, Urine >30 (A) 0 - 2 /hpf   Epithelial Cells (non renal) 0-10 0 - 10 /hpf   Bacteria, UA Moderate (A) None  seen/Few  Urinalysis, Complete  Result Value Ref Range   Specific Gravity, UA 1.025 1.005 - 1.030   pH, UA 7.0 5.0 - 7.5   Color, UA Yellow Yellow   Appearance Ur Clear Clear   Leukocytes,UA Trace (A) Negative   Protein,UA 1+ (A) Negative/Trace   Glucose, UA Negative Negative   Ketones, UA Negative Negative   RBC, UA 3+ (A) Negative   Bilirubin, UA Negative Negative   Urobilinogen, Ur 0.2 0.2 - 1.0 mg/dL   Nitrite, UA Negative Negative   Microscopic Examination See below:   BLADDER SCAN AMB NON-IMAGING  Result Value Ref Range   Scan Result 63m    Assessment & Plan:   1. Gross hematuria Currently associated with bladder pain and grossly infected appearing UA, though he does report episodes of painless gross hematuria and has an extensive smoking history.  Differential today includes acute stone episode, acute cystitis with hematuria, or urologic malignancy.  I recommend proceeding with CT urogram tomorrow for further evaluation.  He is in agreement with this plan. - Urinalysis, Complete - BLADDER SCAN AMB NON-IMAGING - CT HEMATURIA WORKUP; Future  2. Acute cystitis with hematuria Grossly infected urine and possible stone episode as above.  Vitals are stable, no indication for urgent intervention at this time.  We administered 1 dose of IM Rocephin in clinic today and we will transition him to Bactrim and send for culture for further evaluation.  Also prescribing Azo for symptom control in the interim. - CULTURE, URINE COMPREHENSIVE - cefTRIAXone (ROCEPHIN) injection 1 g - sulfamethoxazole-trimethoprim (BACTRIM DS) 800-160 MG tablet; Take 1 tablet by mouth 2 (two) times daily for 6 days.  Dispense: 12 tablet; Refill: 0 - phenazopyridine (PYRIDIUM) 200 MG tablet; Take 1 tablet (200 mg total) by mouth 3 (three) times daily as needed for pain.  Dispense: 10 tablet; Refill: 0  Return for Will call with results.  SDebroah Loop PA-C  [facility] [address] [place] [phone]

## 2022-05-30 NOTE — H&P (View-Only) (Signed)
05/30/2022 4:23 PM   [name] 1946-02-13 [id number]  CC: Chief Complaint  Patient presents with   Hematuria   HPI: [name] is a 76 y.o. male with PMH BPH with LUTS s/p UroLift with Dr. Bernardo Montgomery in 2021 and nephrolithiasis who presents today for evaluation of possible acute stone episode.   Today he reports a 4-day history of gross hematuria with passage of small clots associated with lower abdominal pain described as throbbing and 8/10 in intensity.  He denies fever, chills, nausea, vomiting.  He has chronic midline low back pain which is stable.  He states he periodically has similar symptoms that he attributes to passing a stone.  He notes episodes of painless gross hematuria occurring approximately every 2 months.  He has an approximate 60-pack-year smoking history.  In-office UA today positive for 3+ blood, 1+ protein, and trace leukocytes; urine microscopy with 11-30 WBCs/HPF, >30 RBCs/HPF, and moderate bacteria. PVR 44m.  PMH: Past Medical History:  Diagnosis Date   Anxiety    Arthritis    DDD, lumbar radiculopathy   Bell's palsy    hx of, right side   Bell's palsy    Chronic back pain    followed by pain clinic at [name]   Depression    hx of depression   GERD (gastroesophageal reflux disease)    Hepatitis    hx of hepatitis C treated with Harvoni per patient   History of kidney stones    Hypertension    Kidney stone    hx of   Pre-diabetes    Stroke ([facility] 02/2018   loss of peripheral vision and balance unsteady   Substance [place] ([place]    heroin   Unresponsive episode 02/07/2022   Wears dentures    Has full upper.  Does not wear.    Surgical History: Past Surgical History:  Procedure Laterality Date   ANTERIOR LUMBAR FUSION  05/13/2012   Procedure: ANTERIOR LUMBAR FUSION 1 LEVEL;  Surgeon: Cameron Schools MD;  [place] [facility]  Service: Orthopedics;  Laterality: N/A;  ALIF L5-S1   CATARACT EXTRACTION W/PHACO Left 01/01/2021   Procedure: CATARACT  EXTRACTION PHACO AND INTRAOCULAR LENS PLACEMENT (IOC) LEFT DIABETIC 2.28 00:24.7;  Surgeon: KEulogio Bear MD;  [place] [facility]  Service: Ophthalmology;  Laterality: Left;  covid + 11-30-20   CATARACT EXTRACTION W/PHACO Right 01/15/2021   Procedure: CATARACT EXTRACTION PHACO AND INTRAOCULAR LENS PLACEMENT (IPray RIGHT DIABETIC;  Surgeon: KEulogio Bear MD;  [place]  Service: Ophthalmology;  Laterality: Right;  2.62 0:34.5   CHOLECYSTECTOMY     COLONOSCOPY WITH PROPOFOL N/A 09/19/2021   Procedure: COLONOSCOPY WITH PROPOFOL;  Surgeon: Tol[place], TBenay Pike MD;  Location: [facility];  Service: Gastroenterology;  Laterality: N/A;   CYSTOSCOPY W/ RETROGRADES Left 08/04/2018   Procedure: CYSTOSCOPY WITH RETROGRADE PYELOGRAM;  Surgeon: SAbbie Sons MD;  Location: [facility];  Service: Urology;  Laterality: Left;   CYSTOSCOPY WITH INSERTION OF UROLIFT N/A 01/18/2020   Procedure: CYSTOSCOPY WITH INSERTION OF UROLIFT;  Surgeon: SAbbie Sons MD;  Location: [facility];  Service: Urology;  Laterality: N/A;   CYSTOSCOPY WITH STENT PLACEMENT Left 08/04/2018   Procedure: CYSTOSCOPY WITH STENT PLACEMENT;  Surgeon: SAbbie Sons MD;  Location: [facility];  Service: Urology;  Laterality: Left;   EXTRACORPOREAL SHOCK WAVE LITHOTRIPSY  2012   EXTRACORPOREAL SHOCK WAVE LITHOTRIPSY Left 08/20/2018   Procedure: EXTRACORPOREAL SHOCK WAVE LITHOTRIPSY (ESWL);  Surgeon: BHollice Espy MD;  Location:  [facility];  Service: Urology;  Laterality: Left; (cancelled)   EXTRACORPOREAL SHOCK WAVE LITHOTRIPSY Left 08/27/2018   Procedure: EXTRACORPOREAL SHOCK WAVE LITHOTRIPSY (ESWL);  Surgeon: Abbie Sons, MD;  Location: [facility];  Service: Urology;  Laterality: Left;   EXTRACORPOREAL SHOCK WAVE LITHOTRIPSY Right 10/15/2018   Procedure: EXTRACORPOREAL SHOCK WAVE LITHOTRIPSY (ESWL);  Surgeon: Billey Co, MD;  Location: [facility];  Service: Urology;  Laterality: Right;    KIDNEY STONE SURGERY     LUMBAR FUSION  05/13/2012    Home Medications:  Allergies as of 05/30/2022   No Known Allergies      Medication List        Accurate as of May 30, 2022  4:23 PM. If you have any questions, ask your nurse or doctor.          acetaminophen 325 MG tablet Commonly known as: TYLENOL Take 2 tablets (650 mg total) by mouth every 6 (six) hours as needed for mild pain (or Fever >/= 101).   amLODipine 2.5 MG tablet Commonly known as: NORVASC Take 2.5 mg by mouth daily.   aspirin EC 81 MG tablet Take 1 tablet (81 mg total) by mouth daily. Swallow whole.   atorvastatin 40 MG tablet Commonly known as: LIPITOR Take 40 mg by mouth daily.   baclofen 10 MG tablet Commonly known as: LIORESAL Take 10 mg by mouth 3 (three) times daily.   clopidogrel 75 MG tablet Commonly known as: PLAVIX Take 75 mg by mouth daily.   [employer] D3 25 MCG (1000 UT) capsule Generic drug: Cholecalciferol Take 2,000 Units by mouth daily.   escitalopram 10 MG tablet Commonly known as: LEXAPRO Take 10 mg by mouth daily.   feeding supplement Liqd Take 237 mLs by mouth 2 (two) times daily between meals.   folic acid 1 MG tablet Commonly known as: FOLVITE Take 1 tablet (1 mg total) by mouth daily.   furosemide 20 MG tablet Commonly known as: LASIX Take 20 mg by mouth daily.   hydrochlorothiazide 12.5 MG tablet Commonly known as: HYDRODIURIL Take 12.5 mg by mouth daily.   ibuprofen 800 MG tablet Commonly known as: ADVIL Take 1 tablet (800 mg total) by mouth every 8 (eight) hours as needed.   meclizine 25 MG tablet Commonly known as: ANTIVERT Take 25 mg by mouth 3 (three) times daily as needed for dizziness.   melatonin 5 MG Tabs Take 1 tablet (5 mg total) by mouth at bedtime.   nicotine 21 mg/24hr patch Commonly known as: NICODERM CQ - dosed in mg/24 hours Place 1 patch (21 mg total) onto the skin daily.   phenazopyridine 200 MG tablet Commonly known as:  Pyridium Take 1 tablet (200 mg total) by mouth 3 (three) times daily as needed for pain. Started by: Debroah Loop, PA-C   QUEtiapine 300 MG tablet Commonly known as: SEROQUEL Take 300 mg by mouth at bedtime.   risperiDONE 0.5 MG tablet Commonly known as: RISPERDAL Take 0.5 mg by mouth daily.   sulfamethoxazole-trimethoprim 800-160 MG tablet Commonly known as: BACTRIM DS Take 1 tablet by mouth 2 (two) times daily for 6 days. Started by: Debroah Loop, PA-C   tamsulosin 0.4 MG Caps capsule Commonly known as: FLOMAX Take 0.4 mg by mouth daily.   traZODone 50 MG tablet Commonly known as: DESYREL Take 50 mg by mouth at bedtime.        Allergies:  No Known Allergies  Family History: Family History  Problem Relation Age of Onset  Stroke Mother    Diabetes Mother    Lung cancer Father    Diabetes Father     Social History:   reports that he has been smoking cigarettes. He has a 13.25 pack-year smoking history. He has never used smokeless tobacco. He reports that he does not currently use drugs after having used the following drugs: Heroin, Cocaine, and Marijuana. He reports that he does not drink alcohol.  Physical Exam: BP (!) 144/80   Pulse 90   Ht '5\' 3"'$  (1.6 m)   Wt 147 lb (66.7 kg)   BMI 26.04 kg/m   Constitutional:  Alert and oriented, no acute distress, nontoxic appearing HEENT: [place], AT Cardiovascular: No clubbing, cyanosis, or edema Respiratory: Normal respiratory effort, no increased work of breathing Skin: No rashes, bruises or suspicious lesions Neurologic: Grossly intact, no focal deficits, moving all 4 extremities Psychiatric: Normal mood and affect  Laboratory Data: Results for orders placed or performed in visit on 05/30/22  Microscopic Examination   Urine  Result Value Ref Range   WBC, UA 11-30 (A) 0 - 5 /hpf   RBC, Urine >30 (A) 0 - 2 /hpf   Epithelial Cells (non renal) 0-10 0 - 10 /hpf   Bacteria, UA Moderate (A) None  seen/Few  Urinalysis, Complete  Result Value Ref Range   Specific Gravity, UA 1.025 1.005 - 1.030   pH, UA 7.0 5.0 - 7.5   Color, UA Yellow Yellow   Appearance Ur Clear Clear   Leukocytes,UA Trace (A) Negative   Protein,UA 1+ (A) Negative/Trace   Glucose, UA Negative Negative   Ketones, UA Negative Negative   RBC, UA 3+ (A) Negative   Bilirubin, UA Negative Negative   Urobilinogen, Ur 0.2 0.2 - 1.0 mg/dL   Nitrite, UA Negative Negative   Microscopic Examination See below:   BLADDER SCAN AMB NON-IMAGING  Result Value Ref Range   Scan Result 47m    Assessment & Plan:   1. Gross hematuria Currently associated with bladder pain and grossly infected appearing UA, though he does report episodes of painless gross hematuria and has an extensive smoking history.  Differential today includes acute stone episode, acute cystitis with hematuria, or urologic malignancy.  I recommend proceeding with CT urogram tomorrow for further evaluation.  He is in agreement with this plan. - Urinalysis, Complete - BLADDER SCAN AMB NON-IMAGING - CT HEMATURIA WORKUP; Future  2. Acute cystitis with hematuria Grossly infected urine and possible stone episode as above.  Vitals are stable, no indication for urgent intervention at this time.  We administered 1 dose of IM Rocephin in clinic today and we will transition him to Bactrim and send for culture for further evaluation.  Also prescribing Azo for symptom control in the interim. - CULTURE, URINE COMPREHENSIVE - cefTRIAXone (ROCEPHIN) injection 1 g - sulfamethoxazole-trimethoprim (BACTRIM DS) 800-160 MG tablet; Take 1 tablet by mouth 2 (two) times daily for 6 days.  Dispense: 12 tablet; Refill: 0 - phenazopyridine (PYRIDIUM) 200 MG tablet; Take 1 tablet (200 mg total) by mouth 3 (three) times daily as needed for pain.  Dispense: 10 tablet; Refill: 0  Return for Will call with results.  SDebroah Loop PA-C  [facility] [address] [place]  [phone]

## 2022-05-30 NOTE — Patient Instructions (Addendum)
There are a few things going on today that we are going to address: Possible UTI. You got one shot of antibiotics in clinic today and I'm prescribing you oral antibiotics as well. Start the oral antibiotics tomorrow afternoon. Possible kidney stone and pain. We're going to get a CT scan tomorrow to see if you're passing a stone. In the meantime, take the Pyridium medication I'm prescribing you today to help. It will turn your urine orange. You may also take some of the Tramadol Dr. Ginette Pitman prescribed you to help with your pain. Blood in your urine. You shared with me that sometimes you see blood even when you aren't having pain. You also have a very long smoking history. This increases your risk for different urinary cancers, such as bladder or kidney cancer. Your CT scan tomorrow will give [place] more information about this.  I will call you when I get the results of your urine culture and CT scan. In the meantime, if you develop any of the following, please go to the Emergency Department: -Fever/chills -Nausea/vomiting -Uncontrolled pain

## 2022-05-31 ENCOUNTER — Other Ambulatory Visit: Payer: Self-pay | Admitting: Physician Assistant

## 2022-05-31 ENCOUNTER — Telehealth: Payer: Self-pay | Admitting: Physician Assistant

## 2022-05-31 ENCOUNTER — Telehealth: Payer: Self-pay

## 2022-05-31 ENCOUNTER — Ambulatory Visit
Admission: RE | Admit: 2022-05-31 | Discharge: 2022-05-31 | Disposition: A | Discharge status: Home / Self Care | Payer: Medicare (Managed Care) | Source: Ambulatory Visit | Attending: Physician Assistant | Admitting: Physician Assistant

## 2022-05-31 DIAGNOSIS — R310 Gross hematuria: Secondary | ICD-10-CM | POA: Insufficient documentation

## 2022-05-31 DIAGNOSIS — C689 Malignant neoplasm of urinary organ, unspecified: Secondary | ICD-10-CM

## 2022-05-31 LAB — POCT I-STAT CREATININE: Creatinine, Ser: 0.7 mg/dL (ref 0.61–1.24)

## 2022-05-31 MED ORDER — IOHEXOL 300 MG/ML  SOLN
100.0000 mL | Freq: Once | INTRAMUSCULAR | Status: AC | PRN
  Administered 2022-05-31: 100 mL via INTRAVENOUS

## 2022-05-31 NOTE — Telephone Encounter (Signed)
Patient has called in twice asking for pain medication due to tumor. Spoke with Thomas Hoff who advised that patient should finish antibiotics and should not need any additional pain medication at this time, currently with a long term script for Tramdol from Dr. Ginette Pitman. Patient advised.

## 2022-05-31 NOTE — Progress Notes (Unsigned)
Surgical Physician Order Form [facility] [place]  Dr. Bernardo Heater * Scheduling expectation : ASAP  *Length of Case:   *Clearance needed: yes, Dr. Ginette Pitman  *Anticoagulation Instructions: N/A  *Aspirin Instructions: Hold Aspirin and Plavix  *Post-op visit Date/Instructions:   TBD  *Diagnosis:  Right upper tract urothelial carcinoma  *Procedure:  Cystoscopy with possible bladder biopsy, right ureteroscopy and right renal pelvis vs ureter biopsy with right ureteral stent placement, left retrograde pyelogram with possible biopsy and possible ureteral stent placement  Additional orders: N/A  -Admit type: OUTpatient  -Anesthesia: General  -VTE Prophylaxis Standing Order SCD's       Other:   -Standing Lab Orders Per Anesthesia    Lab other: Urine Drug Screen (UA and culture performed 8/10 in clinic)  -Standing Test orders EKG/Chest x-ray per Anesthesia       Test other:   - Medications:  Ancef 1gm IV  -Other orders:  N/A

## 2022-05-31 NOTE — Telephone Encounter (Addendum)
I just spoke with the patient via telephone to discuss his CT results. He reports feeling a bit better today after receiving IM Rocephin in clinic yesterday. We discussed that there were findings of a right renal pelvis mass concerning for upper tract urothelial carcinoma that would account for his gross hematuria. He is not passing a kidney stone, though I think he may have a bladder infection and he should continue antibiotics.  We discussed that there is no evidence of metastasis at this time but that he requires additional testing to fully characterize this mass. I recommend proceeding to the OR with Dr. Bernardo Heater for cystoscopy under anesthesia with possible bladder biopsy, right ureteroscopy with biopsy and stent placement, and left retrograde pyelogram with possible biopsy and stent placement. We discussed that the duration of stent(s) placement would be determined based on his intraoperative findings. We discussed that treatment options for his cancer would be determined based on his intraoperative and pathology findings but may include surgery versus systemic therapy. Fortunately his renal function is normal with creatinine 0.70.  He expressed understanding and is in agreement with this plan. OR orders placed. He will need to hold aspirin and Plavix to reduce his bleeding risk. I have requested clearance to do so from Dr. Ginette Pitman.

## 2022-06-03 LAB — CULTURE, URINE COMPREHENSIVE

## 2022-06-04 ENCOUNTER — Other Ambulatory Visit: Payer: Self-pay | Admitting: Physician Assistant

## 2022-06-04 DIAGNOSIS — C641 Malignant neoplasm of right kidney, except renal pelvis: Secondary | ICD-10-CM

## 2022-06-04 MED ORDER — URIBEL 118 MG PO CAPS: 1.0000 | ORAL_CAPSULE | Freq: Four times a day (QID) | ORAL | 11 refills | Status: DC | PRN

## 2022-06-05 ENCOUNTER — Telehealth: Payer: Self-pay

## 2022-06-05 NOTE — Telephone Encounter (Signed)
I spoke with [name]. We have discussed possible surgery dates and Tuesday August 29th, 2023 was agreed upon by all parties. Patient given information about surgery date, what to expect pre-operatively and post operatively.  We discussed that a Pre-Admission Testing office will be calling to set up the pre-op visit that will take place prior to surgery, and that these appointments are typically done over the phone with a Pre-Admissions RN.  Informed patient that our office will communicate any additional care to be provided after surgery. Patients questions or concerns were discussed during our call. Advised to call our office should there be any additional information, questions or concerns that arise. Patient verbalized understanding.

## 2022-06-05 NOTE — Progress Notes (Signed)
REQUEST FOR SURGICAL CLEARANCE       Date: Date: 06/05/22  Faxed to: Dr. Ginette Pitman  Surgeon: Dr. John Giovanni     Date of Surgery: 06/18/2022  Operation: Cystoscopy with possible bladder biopsy, Right Ureteroscopy with Right renal pelvis vs Ureteral biopsy and right stent placement, Cystoscopy with left retrograde pyelogram with possible ureteral biopsy and possible left stent placement  Anesthesia Type: General   Diagnosis: Urothelial Carcinoma  Patient Requires:   Medical Clearance : Yes  Reason: We would like for the patient to hold Aspirin and Plavix prior to surgery and would like to know for how long it is recommended  Risk Assessment:    Low   '[]'$       Moderate   '[]'$     High   '[]'$           This patient is optimized for surgery  YES '[]'$       NO   '[]'$    I recommend further assessment/workup prior to surgery. YES '[]'$      NO  '[]'$   Appointment scheduled for: _______________________   Further recommendations: ____________________________________     Physician Signature:__________________________________   Printed Name: ________________________________________   Date: _________________

## 2022-06-05 NOTE — Progress Notes (Signed)
[facility] Posting Form   Surgery Date/Time: Date: 06/18/2022  Surgeon: Dr. John Giovanni, MD  Surgery Location: Day Surgery  Inpt ( No  )   Outpt (Yes)   Obs ( No  )   Diagnosis: C68.9 Urothelial Carcinoma  -CPT: 25750, 51833, 58251, 89842, [phone]  Surgery: Cystoscopy with possible bladder biopsy, Right Ureteroscopy with Right renal pelvis vs Ureteral biopsy and right stent placement, Cystoscopy with left retrograde pyelogram with possible ureteral biopsy and possible left stent placement  Stop Anticoagulations: Yes, Hold Plavix and ASA  Cardiac/Medical/Pulmonary Clearance needed: yes  Clearance needed from Dr: Ginette Pitman  Clearance request sent on: Date: 06/05/22  *Orders entered into EPIC  Date: 06/05/22   *Case booked in EPIC  Date: 06/03/2022  *Notified pt of Surgery: Date: 06/03/2022  PRE-OP UA & CX: UA and culture obtained in clinic on 05/30/2022. Will need a UDS, will obtain at his Pre-Op visit.   *Placed into Prior Authorization Work Que Date: 06/05/22   Assistant/laser/rep:No

## 2022-06-12 ENCOUNTER — Encounter
Admission: RE | Admit: 2022-06-12 | Discharge: 2022-06-12 | Disposition: A | Discharge status: Home / Self Care | Payer: Medicare (Managed Care) | Source: Ambulatory Visit | Attending: Urology | Admitting: Urology

## 2022-06-12 ENCOUNTER — Encounter: Payer: Self-pay | Admitting: Urology

## 2022-06-12 DIAGNOSIS — Z01818 Encounter for other preprocedural examination: Secondary | ICD-10-CM

## 2022-06-12 DIAGNOSIS — Z79899 Other long term (current) drug therapy: Secondary | ICD-10-CM

## 2022-06-12 HISTORY — DX: Anemia, unspecified: D64.9

## 2022-06-12 HISTORY — DX: Occlusion and stenosis of bilateral carotid arteries: I65.23

## 2022-06-12 HISTORY — DX: Peripheral vascular disease, unspecified: I73.9

## 2022-06-12 HISTORY — DX: Poisoning by benzodiazepines, accidental (unintentional), initial encounter: T42.4X1A

## 2022-06-12 HISTORY — DX: Other psychoactive substance abuse, uncomplicated: F19.10

## 2022-06-12 HISTORY — DX: Unsteadiness on feet: R26.81

## 2022-06-12 NOTE — Pre-Procedure Instructions (Signed)
Confirmed pts email. Pt is not signed up thru My Chart so surgery instructions were reviewed verbally over the phone and surgery instructions were also emailed to pt as well so he can review these instructions prior to upcoming 8-29- surgery

## 2022-06-12 NOTE — Pre-Procedure Instructions (Signed)
Did Anesthesia interview with pt. When asked about stopping his Aspirin and Plavix, pt states no one has told him when he needs to stop these meds. I told him that someone would reach out to him and let him know. Celene Kras PA states in her note that the office will reach out to Dr Ginette Pitman pcp about when pt needs to stop these meds. I messaged [place] and she states she has not heard back from Dr Ginette Pitman but is calling their office now and she will relay the message to the pt once she finds out when these meds can be stopped.   Also pt needs a UDS per surgeons office and Anesthesia needs a K+ since pt is on lasix. Pt informed of this and states he cannot come in due to transportation issues. I informed pt that if anything is abnormal with UDS or K+ there is a possibility that his surgery could be cancelled. Pt verbalized understanding

## 2022-06-12 NOTE — Patient Instructions (Addendum)
Your procedure is scheduled on:06-18-22 Tuesday Report to the Registration Desk on the 1st floor of the [facility].Then proceed to the 2nd floor Surgery Desk To find out your arrival time, please call [phone] between 1PM - 3PM on:06-17-22 Monday If your arrival time is 6:00 am, do not arrive prior to that time as the [facility] entrance doors do not open until 6:00 am.  REMEMBER: Instructions that are not followed completely may result in serious medical risk, up to and including death; or upon the discretion of your surgeon and anesthesiologist your surgery may need to be rescheduled.  Do not eat food OR drink any liquids after midnight the night before surgery.  No gum chewing, lozengers or hard candies.  TAKE THESE MEDICATIONS THE MORNING OF SURGERY WITH A SIP OF WATER: -amLODipine (NORVASC)  -atorvastatin (LIPITOR)  -baclofen (LIORESAL) -escitalopram (LEXAPRO) -risperiDONE (RISPERDAL)   Per Melissa at [facility] office, they will reach out to patient once they hear back from Dr Ginette Pitman on when patient needs to stop his Aspirin and clopidogrel (PLAVIX)  One week prior to surgery: Stop Anti-inflammatories (NSAIDS) such as Advil, Aleve, Ibuprofen, Motrin, Naproxen, Naprosyn and Aspirin based products such as Excedrin, Goodys Powder, BC Powder.You may however, take Tylenol if needed for pain up until the day of surgery.  Stop ANY OVER THE COUNTER supplements/vitamins NOW (06-12-22) until after surgery (Vitamin D3 and Folic Acid)  No Alcohol for 24 hours before or after surgery.  No Smoking including e-cigarettes for 24 hours prior to surgery.  No chewable tobacco products for at least 6 hours prior to surgery.  No nicotine patches on the day of surgery.  Do not use any "recreational" drugs for at least a week prior to your surgery.  Please be advised that the combination of cocaine and anesthesia may have negative outcomes, up to and including death. If you test positive for  cocaine, your surgery will be cancelled.  On the morning of surgery brush your teeth with toothpaste and water, you may rinse your mouth with mouthwash if you wish. Do not swallow any toothpaste or mouthwash.  Do not wear jewelry, make-up, hairpins, clips or nail polish.  Do not wear lotions, powders, or perfumes.   Do not shave body from the neck down 48 hours prior to surgery just in case you cut yourself which could leave a site for infection.  Also, freshly shaved skin may become irritated if using the CHG soap.  Contact lenses, hearing aids and dentures may not be worn into surgery.  Do not bring valuables to the hospital. [facility] is not responsible for any missing/lost belongings or valuables.   Notify your doctor if there is any change in your medical condition (cold, fever, infection).  Wear comfortable clothing (specific to your surgery type) to the hospital.  After surgery, you can help prevent lung complications by doing breathing exercises.  Take deep breaths and cough every 1-2 hours. Your doctor may order a device called an Incentive Spirometer to help you take deep breaths. When coughing or sneezing, hold a pillow firmly against your incision with both hands. This is called "splinting." Doing this helps protect your incision. It also decreases belly discomfort.  If you are being admitted to the hospital overnight, leave your suitcase in the car. After surgery it may be brought to your room.  If you are being discharged the day of surgery, you will not be allowed to drive home. You will need a responsible adult (18  years or older) to drive you home and stay with you that night.   If you are taking public transportation, you will need to have a responsible adult (18 years or older) with you. Please confirm with your physician that it is acceptable to use public transportation.   Please call the [facility] Dept. at [phone] if you have any questions  about these instructions.  Surgery Visitation Policy:  Patients undergoing a surgery or procedure may have two family members or support persons with them as long as the person is not COVID-19 positive or experiencing its symptoms.

## 2022-06-13 ENCOUNTER — Telehealth: Payer: Self-pay

## 2022-06-13 ENCOUNTER — Encounter: Payer: Self-pay | Admitting: Urgent Care

## 2022-06-13 NOTE — Telephone Encounter (Signed)
Received Surgical Clearance from Dr. Ginette Pitman. Advised for patient to hold ASA and Plavix x 5 days and resume day after. Spoke with [name] and he verbalized understanding of these instructions.

## 2022-06-17 MED ORDER — FAMOTIDINE 20 MG PO TABS: 20.0000 mg | ORAL_TABLET | Freq: Once | ORAL | Status: DC

## 2022-06-17 MED ORDER — ORAL CARE MOUTH RINSE: 15.0000 mL | Freq: Once | OROMUCOSAL | Status: AC

## 2022-06-17 MED ORDER — CHLORHEXIDINE GLUCONATE 0.12 % MT SOLN: 15.0000 mL | Freq: Once | OROMUCOSAL | Status: AC

## 2022-06-17 MED ORDER — LACTATED RINGERS IV SOLN: INTRAVENOUS | Status: DC

## 2022-06-17 MED ORDER — CEFAZOLIN SODIUM-DEXTROSE 1-4 GM/50ML-% IV SOLN
1.0000 g | INTRAVENOUS | Status: AC
  Administered 2022-06-18: 1 g via INTRAVENOUS

## 2022-06-18 ENCOUNTER — Encounter: Payer: Self-pay | Admitting: Urology

## 2022-06-18 ENCOUNTER — Ambulatory Visit
Admission: RE | Admit: 2022-06-18 | Discharge: 2022-06-18 | Disposition: A | Discharge status: Home / Self Care | Payer: Medicare (Managed Care) | Attending: Urology | Admitting: Urology

## 2022-06-18 ENCOUNTER — Other Ambulatory Visit: Payer: Self-pay

## 2022-06-18 ENCOUNTER — Ambulatory Visit: Payer: Medicare (Managed Care) | Admitting: Urgent Care

## 2022-06-18 ENCOUNTER — Encounter
Admission: RE | Disposition: A | Discharge status: Home / Self Care | Payer: Self-pay | Source: Home / Self Care | Attending: Urology

## 2022-06-18 ENCOUNTER — Ambulatory Visit: Payer: Medicare (Managed Care)

## 2022-06-18 DIAGNOSIS — F17210 Nicotine dependence, cigarettes, uncomplicated: Secondary | ICD-10-CM | POA: Diagnosis not present

## 2022-06-18 DIAGNOSIS — K219 Gastro-esophageal reflux disease without esophagitis: Secondary | ICD-10-CM | POA: Insufficient documentation

## 2022-06-18 DIAGNOSIS — C689 Malignant neoplasm of urinary organ, unspecified: Secondary | ICD-10-CM

## 2022-06-18 DIAGNOSIS — R310 Gross hematuria: Secondary | ICD-10-CM | POA: Diagnosis present

## 2022-06-18 DIAGNOSIS — R7303 Prediabetes: Secondary | ICD-10-CM | POA: Diagnosis not present

## 2022-06-18 DIAGNOSIS — M545 Low back pain, unspecified: Secondary | ICD-10-CM | POA: Diagnosis not present

## 2022-06-18 DIAGNOSIS — N2889 Other specified disorders of kidney and ureter: Secondary | ICD-10-CM

## 2022-06-18 DIAGNOSIS — I10 Essential (primary) hypertension: Secondary | ICD-10-CM | POA: Insufficient documentation

## 2022-06-18 DIAGNOSIS — N401 Enlarged prostate with lower urinary tract symptoms: Secondary | ICD-10-CM | POA: Insufficient documentation

## 2022-06-18 DIAGNOSIS — Z8673 Personal history of transient ischemic attack (TIA), and cerebral infarction without residual deficits: Secondary | ICD-10-CM | POA: Diagnosis not present

## 2022-06-18 DIAGNOSIS — Z01818 Encounter for other preprocedural examination: Secondary | ICD-10-CM

## 2022-06-18 DIAGNOSIS — G8929 Other chronic pain: Secondary | ICD-10-CM | POA: Insufficient documentation

## 2022-06-18 DIAGNOSIS — R9341 Abnormal radiologic findings on diagnostic imaging of renal pelvis, ureter, or bladder: Secondary | ICD-10-CM | POA: Diagnosis not present

## 2022-06-18 HISTORY — PX: CYSTOSCOPY WITH URETEROSCOPY AND STENT PLACEMENT: SHX6377

## 2022-06-18 HISTORY — PX: CYSTOSCOPY WITH RETROGRADE PYELOGRAM, URETEROSCOPY AND STENT PLACEMENT: SHX5789

## 2022-06-18 HISTORY — DX: Other intervertebral disc degeneration, lumbar region: M51.36

## 2022-06-18 HISTORY — DX: Unspecified viral hepatitis C without hepatic coma: B19.20

## 2022-06-18 HISTORY — DX: Other intervertebral disc degeneration, lumbar region without mention of lumbar back pain or lower extremity pain: M51.369

## 2022-06-18 HISTORY — DX: Bell's palsy: G51.0

## 2022-06-18 LAB — POCT I-STAT, CHEM 8
BUN: 16 mg/dL (ref 8–23)
Calcium, Ion: 1.13 mmol/L — ABNORMAL LOW (ref 1.15–1.40)
Chloride: 102 mmol/L (ref 98–111)
Creatinine, Ser: 0.9 mg/dL (ref 0.61–1.24)
Glucose, Bld: 123 mg/dL — ABNORMAL HIGH (ref 70–99)
HCT: 41 % (ref 39.0–52.0)
Hemoglobin: 13.9 g/dL (ref 13.0–17.0)
Potassium: 4.2 mmol/L (ref 3.5–5.1)
Sodium: 141 mmol/L (ref 135–145)
TCO2: 25 mmol/L (ref 22–32)

## 2022-06-18 SURGERY — CYSTOURETEROSCOPY, WITH STENT INSERTION
Anesthesia: General | Laterality: Right

## 2022-06-18 MED ORDER — ACETAMINOPHEN 10 MG/ML IV SOLN
INTRAVENOUS | Status: DC | PRN
  Administered 2022-06-18: 1000 mg via INTRAVENOUS

## 2022-06-18 MED ORDER — DEXMEDETOMIDINE (PRECEDEX) IN NS 20 MCG/5ML (4 MCG/ML) IV SYRINGE
PREFILLED_SYRINGE | INTRAVENOUS | Status: DC | PRN
  Administered 2022-06-18 (×3): 4 ug via INTRAVENOUS

## 2022-06-18 MED ORDER — FAMOTIDINE 20 MG PO TABS
ORAL_TABLET | ORAL | Status: AC
  Filled 2022-06-18: qty 1

## 2022-06-18 MED ORDER — ACETAMINOPHEN 10 MG/ML IV SOLN
INTRAVENOUS | Status: AC
  Filled 2022-06-18: qty 100

## 2022-06-18 MED ORDER — LIDOCAINE HCL (CARDIAC) PF 100 MG/5ML IV SOSY
PREFILLED_SYRINGE | INTRAVENOUS | Status: DC | PRN
  Administered 2022-06-18: 60 mg via INTRAVENOUS

## 2022-06-18 MED ORDER — DEXAMETHASONE SODIUM PHOSPHATE 10 MG/ML IJ SOLN
INTRAMUSCULAR | Status: DC | PRN
  Administered 2022-06-18: 10 mg via INTRAVENOUS

## 2022-06-18 MED ORDER — PHENYLEPHRINE 80 MCG/ML (10ML) SYRINGE FOR IV PUSH (FOR BLOOD PRESSURE SUPPORT)
PREFILLED_SYRINGE | INTRAVENOUS | Status: DC | PRN
  Administered 2022-06-18: 80 ug via INTRAVENOUS

## 2022-06-18 MED ORDER — DEXAMETHASONE SODIUM PHOSPHATE 10 MG/ML IJ SOLN
INTRAMUSCULAR | Status: AC
  Filled 2022-06-18: qty 1

## 2022-06-18 MED ORDER — ROCURONIUM BROMIDE 10 MG/ML (PF) SYRINGE
PREFILLED_SYRINGE | INTRAVENOUS | Status: AC
  Filled 2022-06-18: qty 10

## 2022-06-18 MED ORDER — FENTANYL CITRATE (PF) 100 MCG/2ML IJ SOLN
INTRAMUSCULAR | Status: DC | PRN
  Administered 2022-06-18 (×2): 50 ug via INTRAVENOUS

## 2022-06-18 MED ORDER — FENTANYL CITRATE (PF) 100 MCG/2ML IJ SOLN
INTRAMUSCULAR | Status: AC
  Filled 2022-06-18: qty 2

## 2022-06-18 MED ORDER — SUGAMMADEX SODIUM 200 MG/2ML IV SOLN
INTRAVENOUS | Status: DC | PRN
  Administered 2022-06-18: 200 mg via INTRAVENOUS

## 2022-06-18 MED ORDER — MIDAZOLAM HCL 2 MG/2ML IJ SOLN
INTRAMUSCULAR | Status: DC | PRN
  Administered 2022-06-18: 1 mg via INTRAVENOUS

## 2022-06-18 MED ORDER — ONDANSETRON HCL 4 MG/2ML IJ SOLN
INTRAMUSCULAR | Status: DC | PRN
  Administered 2022-06-18: 4 mg via INTRAVENOUS

## 2022-06-18 MED ORDER — ONDANSETRON HCL 4 MG/2ML IJ SOLN
INTRAMUSCULAR | Status: AC
  Filled 2022-06-18: qty 2

## 2022-06-18 MED ORDER — CHLORHEXIDINE GLUCONATE 0.12 % MT SOLN
OROMUCOSAL | Status: AC
  Administered 2022-06-18: 15 mL via OROMUCOSAL
  Filled 2022-06-18: qty 15

## 2022-06-18 MED ORDER — MIDAZOLAM HCL 2 MG/2ML IJ SOLN
INTRAMUSCULAR | Status: AC
  Filled 2022-06-18: qty 2

## 2022-06-18 MED ORDER — SODIUM CHLORIDE 0.9 % IR SOLN
Status: DC | PRN
  Administered 2022-06-18: 3000 mL via INTRAVESICAL

## 2022-06-18 MED ORDER — ROCURONIUM BROMIDE 100 MG/10ML IV SOLN
INTRAVENOUS | Status: DC | PRN
  Administered 2022-06-18: 10 mg via INTRAVENOUS
  Administered 2022-06-18: 40 mg via INTRAVENOUS

## 2022-06-18 MED ORDER — PROPOFOL 10 MG/ML IV BOLUS
INTRAVENOUS | Status: AC
  Filled 2022-06-18: qty 40

## 2022-06-18 MED ORDER — PHENYLEPHRINE HCL-NACL 20-0.9 MG/250ML-% IV SOLN
INTRAVENOUS | Status: AC
  Filled 2022-06-18: qty 250

## 2022-06-18 MED ORDER — IOHEXOL 180 MG/ML  SOLN
INTRAMUSCULAR | Status: DC | PRN
  Administered 2022-06-18: 20 mL

## 2022-06-18 MED ORDER — CEFAZOLIN SODIUM-DEXTROSE 1-4 GM/50ML-% IV SOLN
INTRAVENOUS | Status: AC
  Filled 2022-06-18: qty 50

## 2022-06-18 MED ORDER — PROPOFOL 10 MG/ML IV BOLUS
INTRAVENOUS | Status: DC | PRN
  Administered 2022-06-18: 130 mg via INTRAVENOUS

## 2022-06-18 MED ORDER — LIDOCAINE HCL (PF) 2 % IJ SOLN
INTRAMUSCULAR | Status: AC
  Filled 2022-06-18: qty 5

## 2022-06-18 SURGICAL SUPPLY — 28 items
BAG DRAIN SIEMENS DORNER NS (MISCELLANEOUS) ×4 IMPLANT
BRUSH SCRUB EZ 1% IODOPHOR (MISCELLANEOUS) ×4 IMPLANT
CATH URET FLEX-TIP 2 LUMEN 10F (CATHETERS) ×2 IMPLANT
CATH URETL OPEN 5X70 (CATHETERS) ×2 IMPLANT
DRSG TELFA 3X4 N-ADH STERILE (GAUZE/BANDAGES/DRESSINGS) ×4 IMPLANT
ELECT REM PT RETURN 9FT ADLT (ELECTROSURGICAL) ×4
ELECTRODE REM PT RTRN 9FT ADLT (ELECTROSURGICAL) ×4 IMPLANT
FORCEPS BIOP PIRANHA Y (CUTTING FORCEPS) IMPLANT
GAUZE 4X4 16PLY ~~LOC~~+RFID DBL (SPONGE) ×8 IMPLANT
GLOVE SURG UNDER POLY LF SZ7.5 (GLOVE) ×4 IMPLANT
GOWN STRL REUS W/ TWL LRG LVL3 (GOWN DISPOSABLE) ×8 IMPLANT
GOWN STRL REUS W/ TWL XL LVL3 (GOWN DISPOSABLE) ×4 IMPLANT
GOWN STRL REUS W/TWL LRG LVL3 (GOWN DISPOSABLE) ×8
GOWN STRL REUS W/TWL XL LVL3 (GOWN DISPOSABLE) ×4
GUIDEWIRE GREEN .038 145CM (MISCELLANEOUS) ×2 IMPLANT
GUIDEWIRE STR DUAL SENSOR (WIRE) ×4 IMPLANT
IV NS IRRIG 3000ML ARTHROMATIC (IV SOLUTION) ×4 IMPLANT
KIT TURNOVER CYSTO (KITS) ×4 IMPLANT
NDL SAFETY ECLIP 18X1.5 (MISCELLANEOUS) ×4 IMPLANT
Open End Ureteral Catheter ×2 IMPLANT
PACK CYSTO AR (MISCELLANEOUS) ×4 IMPLANT
SET CYSTO W/LG BORE CLAMP LF (SET/KITS/TRAYS/PACK) ×4 IMPLANT
SET URETL DIL (MISCELLANEOUS) ×2 IMPLANT
STENT CONTOUR 8FR X 24 (STENTS) ×2 IMPLANT
SURGILUBE 2OZ TUBE FLIPTOP (MISCELLANEOUS) ×4 IMPLANT
VALVE UROSEAL ADJ ENDO (VALVE) ×2 IMPLANT
WATER STERILE IRR 3000ML UROMA (IV SOLUTION) ×4 IMPLANT
WATER STERILE IRR 500ML POUR (IV SOLUTION) ×4 IMPLANT

## 2022-06-18 NOTE — Discharge Instructions (Addendum)
AMBULATORY SURGERY  DISCHARGE INSTRUCTIONS   The drugs that you were given will stay in your system until tomorrow so for the next 24 hours you should not:  Drive an automobile Make any legal decisions Drink any alcoholic beverage   You may resume regular meals tomorrow.  Today it is better to start with liquids and gradually work up to solid foods.  You may eat anything you prefer, but it is better to start with liquids, then soup and crackers, and gradually work up to solid foods.   Please notify your doctor immediately if you have any unusual bleeding, trouble breathing, redness and pain at the surgery site, drainage, fever, or pain not relieved by medication.    Additional Instructions:  DISCHARGE INSTRUCTIONS FOR URETERAL STENT   MEDICATIONS:  1. Resume all your other meds from home.  2.  AZO (over-the-counter) can help with the burning/stinging when you urinate. 3.  Trospium is for bladder irritation, Rx was sent to your pharmacy. 4.  Continue tramadol if needed for pain 5.  You may resume Plavix 06/19/2022  ACTIVITY:  1. May resume regular activities in 24 hours. 2. No driving while on narcotic pain medications  3. Drink plenty of water  4. Continue to walk at home - you can still get blood clots when you are at home, so keep active, but don't over do it.  5. May return to work/school tomorrow or when you feel ready    SIGNS/SYMPTOMS TO CALL:  Common postoperative symptoms include urinary frequency, urgency, bladder spasm and blood in the urine  Please call us if you have a fever greater than 101.5, uncontrolled nausea/vomiting, uncontrolled pain, dizziness, unable to urinate, excessively bloody urine, chest pain, shortness of breath, leg swelling, leg pain, or any other concerns or questions.   You can reach [place] at [phone].   FOLLOW-UP:  1.  You will be contacted for a follow-up appointment     Please contact your physician with any problems or Same Day  Surgery at [phone], Monday through Friday 6 am to 4 pm, or [place] at [facility] number at [phone].

## 2022-06-18 NOTE — Anesthesia Procedure Notes (Addendum)
Procedure Name: Intubation Date/Time: 06/18/2022 7:38 AM  Performed by: Loletha Grayer, CRNAPre-anesthesia Checklist: Patient identified, Patient being monitored, Timeout performed, Emergency Drugs available and Suction available Patient Re-evaluated:Patient Re-evaluated prior to induction Oxygen Delivery Method: Circle system utilized Preoxygenation: Pre-oxygenation with 100% oxygen Induction Type: IV induction Ventilation: Mask ventilation without difficulty and Oral airway inserted - appropriate to patient size Laryngoscope Size: Sabra Heck and 2 Grade View: Grade I Tube type: Oral Tube size: 7.0 mm Number of attempts: 1 Airway Equipment and Method: Stylet Placement Confirmation: ETT inserted through vocal cords under direct vision, positive ETCO2 and breath sounds checked- equal and bilateral Secured at: 22 cm Tube secured with: Tape Dental Injury: Teeth and Oropharynx as per pre-operative assessment

## 2022-06-18 NOTE — Op Note (Signed)
Preoperative diagnosis:  Gross hematuria Filling defect right renal pelvis  Postoperative diagnosis:  Same  Procedure: Cystoscopy with bilateral retrograde pyelograms Right ureteroscopy Saline barbotage right renal pelvis Placement right ureteral stent Intraoperative fluoroscopy < 30 min  Surgeon: Abbie Sons, MD  Anesthesia: General  Complications: None  Intraoperative findings:  Cystoscopy-urethra normal in caliber without stricture.  Prostate with anterior channel status post UroLift.  Bladder mucosa without erythema, solid or papillary lesions Left retrograde pyelogram-normal ureter and collecting system without narrowing, dilation or filling defects Right retrograde pyelogram-normal-appearing ureter without filling defect or dilation.  Large filling defect right renal pelvis Right ureteroscopy-no ureteral mucosal abnormalities.  Narrowing right mid ureter which was dilated.  Narrowing at UPJ and unable to advance ureteroscope into the renal pelvis  EBL: Minimal  Specimens: Saline barbotage right renal pelvis for cytology  Indication: [name] is a 76 y.o. with a history of BPH and prior UroLift.  Recent office visit for gross hematuria.  CT urogram with filling defect right renal pelvis.  After reviewing the management options for treatment, he elected to proceed with the above surgical procedure(s). We have discussed the potential benefits and risks of the procedure, side effects of the proposed treatment, the likelihood of the patient achieving the goals of the procedure, and any potential problems that might occur during the procedure or recuperation. Informed consent has been obtained.  Description of procedure:  The patient was taken to the operating room and general anesthesia was induced.  The patient was placed in the dorsal lithotomy position, prepped and draped in the usual sterile fashion, and preoperative antibiotics were administered. A preoperative  time-out was performed.   A 21 French cystoscope was lubricated, passed per urethra and advanced proximally under direct vision with findings as described above.  Attention was directed to the left ureteral orifice and a 5 French open-ended ureteral catheter was placed through the cystoscope and into the left UO.  Retrograde pyelogram was performed with findings as described above.  The catheter was then placed into the right UO and an identical procedure was performed on the contralateral side with findings as described above.  The ureteral catheter was removed and a 0.038 sensor wire was placed into the right UO and advanced into the renal pelvis under fluoroscopic guidance without difficulty.  A dual-lumen catheter was then placed over the guidewire and a Super Stiff wire was placed through the catheter and into the renal pelvis.  A single channel digital flexible ureteroscope was placed over the Super Stiff wire and into the distal ureter without difficulty.  There was a narrowed area in the mid ureter which the ureteroscope would not negotiate.  The ureteroscope was removed.  A 4.5 French semirigid ureteroscope was passed per urethra and advanced into the right UO.  The 4.5 [place] scope was able to be advanced to the proximal ureter and no ureteral mucosal abnormalities were identified.  The Super Stiff wire was replaced through the ureteroscope followed by scope removal.  Over-the-wire ureteral dilators were then advanced serially from 8-10 [place].  The single channel flexible ureteroscope was then placed over the Super Stiff wire and was able to be advanced to the UPJ however could not be advanced into the renal pelvis.  The ureteroscope and Super Stiff wire removed.  A 5 French open-ended ureteral catheter was placed over the guidewire and saline barbotage was performed and sent for cytology.  The guidewire was backloaded on the cystoscope and a 9F/24 cm Percuflex  ureteral stent was placed  without difficulty.  Good positioning was noted proximally under fluoroscopy and distally under direct vision.  The bladder was emptied and the cystoscope was removed.  The patient was transported to the PACU in stable condition.  Plan: Await urine cytology results Possible second look ureteroscopy 2-3 weeks after a period of passive stent dilation   Abbie Sons, M.D.

## 2022-06-18 NOTE — Interval H&P Note (Signed)
History and Physical Interval Note:  CV:RRR Lungs:clear  06/18/2022 7:13 AM  [name]  has presented today for surgery, with the diagnosis of Right Upper Tract Urothelial Carcinoma.  The various methods of treatment have been discussed with the patient and family. After consideration of risks, benefits and other options for treatment, the patient has consented to  Procedure(s): CYSTOSCOPY WITH BLADDER BIOPSY (N/A) URETERAL VS RENAL PELVIS BIOPSY (Bilateral) CYSTOSCOPY WITH URETEROSCOPY AND STENT PLACEMENT (Right) CYSTOSCOPY WITH RETROGRADE PYELOGRAM, URETEROSCOPY AND STENT PLACEMENT (Left) as a surgical intervention.  The patient's history has been reviewed, patient examined, no change in status, stable for surgery.  I have reviewed the patient's chart and labs.  Questions were answered to the patient's satisfaction.     [name]

## 2022-06-18 NOTE — Anesthesia Preprocedure Evaluation (Signed)
Anesthesia Evaluation  Patient identified by MRN, date of birth, ID band Patient awake    Reviewed: Allergy & Precautions, NPO status , Patient's Chart, lab work & pertinent test results  History of Anesthesia Complications Negative for: history of anesthetic complications  Airway Mallampati: II  TM Distance: >3 FB Neck ROM: full    Dental  (+) Edentulous Upper, Edentulous Lower, Dental Advidsory Given   Pulmonary neg shortness of breath, COPD, neg recent URI, Current Smoker,    Pulmonary exam normal  + decreased breath sounds      Cardiovascular Exercise Tolerance: Poor hypertension, Pt. on medications (-) angina+ Peripheral Vascular Disease  (-) Past MI and (-) Cardiac Stents Normal cardiovascular exam(-) dysrhythmias (-) Valvular Problems/Murmurs Rhythm:Regular     Neuro/Psych neg Seizures PSYCHIATRIC DISORDERS Anxiety Depression Bipolar Disorder CVA    GI/Hepatic negative GI ROS, GERD  ,(+) Hepatitis -, C  Endo/Other  diabetes (borderline)  Renal/GU Renal disease (kidney stones and a mass)  negative genitourinary   Musculoskeletal   Abdominal Normal abdominal exam  (+)   Peds negative pediatric ROS (+)  Hematology negative hematology ROS (+)   Anesthesia Other Findings Past Medical History: No date: Anxiety No date: Arthritis     Comment:  DDD, lumbar radiculopathy No date: Bell's palsy     Comment:  hx of, right side No date: Bell's palsy No date: Chronic back pain     Comment:  followed by pain clinic at [facility] No date: Depression     Comment:  hx of depression No date: GERD (gastroesophageal reflux disease) No date: Hepatitis     Comment:  hx of hepatitis C treated with Harvoni per patient No date: History of kidney stones No date: Hypertension No date: Kidney stone     Comment:  hx of No date: Pre-diabetes 02/2018: Stroke ([place])     Comment:  loss of peripheral vision and balance unsteady No date:  Substance abuse ([place])     Comment:  heroin No date: Wears dentures     Comment:  Has full upper.  Does not wear.  Past Surgical History: 05/13/2012: ANTERIOR LUMBAR FUSION     Comment:  Procedure: ANTERIOR LUMBAR FUSION 1 LEVEL;  Surgeon:               Melina Schools, MD;  Location: [place];  Service:               Orthopedics;  Laterality: N/A;  ALIF L5-S1 01/01/2021: CATARACT EXTRACTION W/PHACO; Left     Comment:  Procedure: CATARACT EXTRACTION PHACO AND INTRAOCULAR               LENS PLACEMENT (IOC) LEFT DIABETIC 2.28 00:24.7;                Surgeon: Eulogio Bear, MD;  Location: [place];  Service: Ophthalmology;  Laterality: Left;              covid + 11-30-20 01/15/2021: CATARACT EXTRACTION W/PHACO; Right     Comment:  Procedure: CATARACT EXTRACTION PHACO AND INTRAOCULAR               LENS PLACEMENT (IOC) RIGHT DIABETIC;  Surgeon: Eulogio Bear, MD;  Location: [place];                Service:  Ophthalmology;  Laterality: Right;  2.62 0:34.5 No date: CHOLECYSTECTOMY 08/04/2018: CYSTOSCOPY W/ RETROGRADES; Left     Comment:  Procedure: CYSTOSCOPY WITH RETROGRADE PYELOGRAM;                Surgeon: Abbie Sons, MD;  Location: [facility];                Service: Urology;  Laterality: Left; 01/18/2020: CYSTOSCOPY WITH INSERTION OF UROLIFT; N/A     Comment:  Procedure: CYSTOSCOPY WITH INSERTION OF UROLIFT;                Surgeon: Abbie Sons, MD;  Location: [facility];                Service: Urology;  Laterality: N/A; 08/04/2018: CYSTOSCOPY WITH STENT PLACEMENT; Left     Comment:  Procedure: CYSTOSCOPY WITH STENT PLACEMENT;  Surgeon:               Abbie Sons, MD;  Location: [facility];  Service:               Urology;  Laterality: Left; 2012: EXTRACORPOREAL SHOCK WAVE LITHOTRIPSY 08/20/2018: EXTRACORPOREAL SHOCK WAVE LITHOTRIPSY; Left     Comment:  Procedure: EXTRACORPOREAL SHOCK WAVE LITHOTRIPSY (ESWL);               Surgeon: Hollice Espy, MD;  Location: [facility];                Service: Urology;  Laterality: Left; (cancelled) 08/27/2018: EXTRACORPOREAL SHOCK WAVE LITHOTRIPSY; Left     Comment:  Procedure: EXTRACORPOREAL SHOCK WAVE LITHOTRIPSY (ESWL);              Surgeon: Abbie Sons, MD;  Location: [facility];                Service: Urology;  Laterality: Left; 10/15/2018: EXTRACORPOREAL SHOCK WAVE LITHOTRIPSY; Right     Comment:  Procedure: EXTRACORPOREAL SHOCK WAVE LITHOTRIPSY (ESWL);              Surgeon: Billey Co, MD;  Location: [facility];                Service: Urology;  Laterality: Right; No date: KIDNEY STONE SURGERY 05/13/2012: LUMBAR FUSION  BMI    Body Mass Index: 26.89 kg/m      Reproductive/Obstetrics negative OB ROS                             Anesthesia Physical  Anesthesia Plan  ASA: 3  Anesthesia Plan: General   Post-op Pain Management:    Induction: Intravenous  PONV Risk Score and Plan: Ondansetron, Dexamethasone and Treatment may vary due to age or medical condition  Airway Management Planned: LMA and Oral ETT  Additional Equipment:   Intra-op Plan:   Post-operative Plan: Extubation in OR  Informed Consent: I have reviewed the patients History and Physical, chart, labs and discussed the procedure including the risks, benefits and alternatives for the proposed anesthesia with the patient or authorized representative who has indicated his/her understanding and acceptance.     Dental Advisory Given  Plan Discussed with: CRNA and Surgeon  Anesthesia Plan Comments:         Anesthesia Quick Evaluation

## 2022-06-18 NOTE — Transfer of Care (Signed)
Immediate Anesthesia Transfer of Care Note  Patient: [name]  Procedure(s) Performed: CYSTOSCOPY WITH URETEROSCOPY AND STENT PLACEMENT (Right) CYSTOSCOPY WITH RETROGRADE PYELOGRAM (Left)  Patient Location: PACU  Anesthesia Type:General  Level of Consciousness: drowsy  Airway & Oxygen Therapy: Patient Spontanous Breathing and Patient connected to face mask oxygen  Post-op Assessment: Report given to RN and Post -op Vital signs reviewed and stable  Post vital signs: Reviewed and stable  Last Vitals:  Vitals Value Taken Time  BP 150/91 06/18/22 0844  Temp    Pulse 80 06/18/22 0844  Resp 15 06/18/22 0844  SpO2 100 % 06/18/22 0844  Vitals shown include unvalidated device data.  Last Pain:  Vitals:   06/18/22 0711  TempSrc: Oral  PainSc: 0-No pain         Complications: No notable events documented.

## 2022-06-19 LAB — SURGICAL PATHOLOGY

## 2022-06-20 ENCOUNTER — Telehealth: Payer: Self-pay

## 2022-06-20 ENCOUNTER — Telehealth: Payer: Self-pay | Admitting: Urology

## 2022-06-20 MED ORDER — MIRABEGRON ER 50 MG PO TB24: 50.0000 mg | ORAL_TABLET | Freq: Every day | ORAL | 0 refills | Status: DC

## 2022-06-20 MED ORDER — TAMSULOSIN HCL 0.4 MG PO CAPS: 0.4000 mg | ORAL_CAPSULE | Freq: Every day | ORAL | 0 refills | Status: DC

## 2022-06-20 NOTE — Telephone Encounter (Signed)
Incoming call on triage line from pt w c/o stent pain. Per Dr Bernardo Heater, send in tamsulosin and pt may pick up Myrbetriq '50mg'$  samples as well. Also advised pt to take tylenol or ibuprofen as needed. Pt expressed understanding.

## 2022-06-21 NOTE — Anesthesia Postprocedure Evaluation (Signed)
Anesthesia Post Note  Patient: DAANISH COPES  Procedure(s) Performed: CYSTOSCOPY WITH URETEROSCOPY AND STENT PLACEMENT (Right) CYSTOSCOPY WITH RETROGRADE PYELOGRAM (Left)  Patient location during evaluation: PACU Anesthesia Type: General Level of consciousness: awake and alert Pain management: pain level controlled Vital Signs Assessment: post-procedure vital signs reviewed and stable Respiratory status: spontaneous breathing, nonlabored ventilation, respiratory function stable and patient connected to nasal cannula oxygen Cardiovascular status: blood pressure returned to baseline and stable Postop Assessment: no apparent nausea or vomiting Anesthetic complications: no   No notable events documented.   Last Vitals:  Vitals:   06/18/22 0915 06/18/22 0929  BP: (!) 157/74 (!) 175/74  Pulse: 81 86  Resp: 12 16  Temp:  (!) 36.3 C  SpO2: 97% 98%    Last Pain:  Vitals:   06/18/22 0929  TempSrc:   PainSc: 2                  Martha Clan

## 2022-06-26 ENCOUNTER — Telehealth: Payer: Self-pay

## 2022-06-26 NOTE — Telephone Encounter (Signed)
Urine cytology was unremarkable.  Need to schedule follow-up ureteroscopy.  Same orders as last time.  Let me know if I need to send new orders

## 2022-06-26 NOTE — Telephone Encounter (Signed)
Patient called in today, wanting to know what next steps are in treatment plan and if you thought anything was concerning from previous surgery.

## 2022-06-27 NOTE — Telephone Encounter (Signed)
Patient had complicated orders last time, are you wanting all of them again? It may be easier if you send me new orders for him, just so that I book this appropriately.

## 2022-06-30 ENCOUNTER — Other Ambulatory Visit: Payer: Self-pay | Admitting: Urology

## 2022-06-30 DIAGNOSIS — C641 Malignant neoplasm of right kidney, except renal pelvis: Secondary | ICD-10-CM

## 2022-06-30 NOTE — Progress Notes (Unsigned)
Surgical Physician [facility]  * Scheduling expectation : Next Available  *Length of Case: 60 min   *Clearance needed: Hold Plavix/aspirin.  *Anticoagulation Instructions: Hold all anticoagulants  *Aspirin Instructions: Hold Aspirin  *Post-op visit Date/Instructions:  1 week cysto stent removal  *Diagnosis:  Right renal pelvic mass  *Procedure: Right  uteroscopy with biopsy renal pelvic mass; right ureteral stent exchange   Additional orders: N/A  -Admit type: OUTpatient  -Anesthesia: General  -VTE Prophylaxis Standing Order SCD's       Other:   -Standing Lab Orders Per Anesthesia    Lab other: UA&Urine Culture  -Standing Test orders EKG/Chest x-ray per Anesthesia       Test other:   - Medications:  Ancef 2gm IV  -Other orders:  N/A

## 2022-07-01 ENCOUNTER — Telehealth: Payer: Self-pay

## 2022-07-01 NOTE — Progress Notes (Signed)
[facility] Posting Form   Surgery Date/Time: Date: 07/16/2022  Surgeon: Dr. John Giovanni, MD  Surgery Location: Day Surgery  Inpt ( No  )   Outpt (Yes)   Obs ( Yes  )   Diagnosis: C64.1 Right Renal Pelvis Mass  -CPT: 01314, [id number], [id number]  Surgery: Right Ureteroscopy with biopsy of right renal pelvis mass, Right Ureteral Stent Exchange  Stop Anticoagulations: Yes, hold Plavix and ASA for 5 days.   Cardiac/Medical/Pulmonary Clearance needed: yes, obtained from Dr. Ginette Pitman. Hold ASA and Plavix for 5 days prior to surgery.  *Orders entered into EPIC  Date: 07/01/22   *Case booked in EPIC  Date: 07/01/22  *Notified pt of Surgery: Date: 07/01/22  PRE-OP UA & CX: yes, will obtain in clinic on 07/03/2022  *Placed into Prior Authorization Work Fabio Bering Date: 07/01/22   Assistant/laser/rep:No

## 2022-07-01 NOTE — Telephone Encounter (Signed)
I spoke with [name]. We have discussed possible surgery dates and Tuesday September 26th, 2023 was agreed upon by all parties. Patient given information about surgery date, what to expect pre-operatively and post operatively.  We discussed that a Pre-Admission Testing office will be calling to set up the pre-op visit that will take place prior to surgery, and that these appointments are typically done over the phone with a Pre-Admissions RN.  Informed patient that our office will communicate any additional care to be provided after surgery. Patients questions or concerns were discussed during our call. Advised to call our office should there be any additional information, questions or concerns that arise. Patient verbalized understanding.

## 2022-07-03 ENCOUNTER — Encounter (INDEPENDENT_AMBULATORY_CARE_PROVIDER_SITE_OTHER): Payer: Medicare HMO | Admitting: Nurse Practitioner

## 2022-07-03 ENCOUNTER — Encounter: Payer: Self-pay | Admitting: Urology

## 2022-07-03 ENCOUNTER — Other Ambulatory Visit: Payer: Medicare (Managed Care)

## 2022-07-03 ENCOUNTER — Encounter (INDEPENDENT_AMBULATORY_CARE_PROVIDER_SITE_OTHER): Payer: Medicare HMO

## 2022-07-04 ENCOUNTER — Other Ambulatory Visit: Payer: Medicare (Managed Care)

## 2022-07-04 DIAGNOSIS — C641 Malignant neoplasm of right kidney, except renal pelvis: Secondary | ICD-10-CM

## 2022-07-04 LAB — URINALYSIS, COMPLETE
Bilirubin, UA: NEGATIVE
Glucose, UA: NEGATIVE
Ketones, UA: NEGATIVE
Nitrite, UA: NEGATIVE
Specific Gravity, UA: 1.02 (ref 1.005–1.030)
Urobilinogen, Ur: 0.2 mg/dL (ref 0.2–1.0)
pH, UA: 6 (ref 5.0–7.5)

## 2022-07-04 LAB — MICROSCOPIC EXAMINATION
RBC, Urine: >30 /hpf — AB (ref 0–2)
WBC, UA: >30 /hpf — AB (ref 0–5)

## 2022-07-08 ENCOUNTER — Telehealth: Payer: Self-pay | Admitting: Urology

## 2022-07-08 LAB — CULTURE, URINE COMPREHENSIVE

## 2022-07-08 NOTE — Telephone Encounter (Addendum)
Looked in patient chart . He has some refill of urbibel . He was advised in the last noted about this. Per Dr Bernardo Heater, send in tamsulosin and pt may pick up Myrbetriq '50mg'$  samples as well. Also advised pt to take tylenol or ibuprofen as needed. Pt expressed understanding.

## 2022-07-08 NOTE — Telephone Encounter (Signed)
Patient called the triage line this morning with complaint of burning during urination and blood in his urine.   I explained that blood in the urine with a stent is normal and to be expected.   He is requesting a prescription for the painful urination to be [facility] to [facility] [address] in [place].   I advised him that I would send a message back to the medical assistants for triage.

## 2022-07-10 ENCOUNTER — Other Ambulatory Visit: Payer: Self-pay

## 2022-07-10 ENCOUNTER — Encounter
Admission: RE | Admit: 2022-07-10 | Discharge: 2022-07-10 | Disposition: A | Discharge status: Home / Self Care | Payer: Medicare (Managed Care) | Source: Ambulatory Visit | Attending: Urology | Admitting: Urology

## 2022-07-10 NOTE — Patient Instructions (Addendum)
Your procedure is scheduled on: 07/16/22 - Tuesday Report to the Registration Desk on the 1st floor of the [facility]. To find out your arrival time, please call [phone] between 1PM - 3PM on: 07/15/22 - 07/15/22  If your arrival time is 6:00 am, do not arrive prior to that time as the [place] entrance doors do not open until 6:00 am.  REMEMBER: Instructions that are not followed completely may result in serious medical risk, up to and including death; or upon the discretion of your surgeon and anesthesiologist your surgery may need to be rescheduled.  Do not eat food or drink any fluids after midnight the night before surgery.  No gum chewing, lozengers or hard candies.  TAKE THESE MEDICATIONS THE MORNING OF SURGERY WITH A SIP OF WATER: - amLODipine (NORVASC)  - atorvastatin (LIPITOR)   - baclofen (LIORESAL) 10 MG tablet  - escitalopram (LEXAPRO)  - mirabegron ER  - risperiDONE (RISPERDAL) - tamsulosin (FLOMAX)   Hold Plavix and Aspirin 5 days prior to your procedure.  One week prior to surgery: Stop Anti-inflammatories (NSAIDS) such as Advil, Aleve, Ibuprofen, Motrin, Naproxen, Naprosyn and Aspirin based products such as Excedrin, Goodys Powder, BC Powder.  Stop ANY OVER THE COUNTER supplements until after surgery.  You may however, continue to take Tylenol if needed for pain up until the day of surgery.  No Alcohol for 24 hours before or after surgery.  No Smoking including e-cigarettes for 24 hours prior to surgery.  No chewable tobacco products for at least 6 hours prior to surgery.  No nicotine patches on the day of surgery.  Do not use any "recreational" drugs for at least a week prior to your surgery.  Please be advised that the combination of cocaine and anesthesia may have negative outcomes, up to and including death. If you test positive for cocaine, your surgery will be cancelled.  On the morning of surgery brush your teeth with toothpaste and water,  you may rinse your mouth with mouthwash if you wish. Do not swallow any toothpaste or mouthwash.  Use CHG Soap or wipes as directed on instruction sheet.  Do not wear jewelry, make-up, hairpins, clips or nail polish.  Do not wear lotions, powders, or perfumes.   Do not shave body from the neck down 48 hours prior to surgery just in case you cut yourself which could leave a site for infection.  Also, freshly shaved skin may become irritated if using the CHG soap.  Contact lenses, hearing aids and dentures may not be worn into surgery.  Do not bring valuables to the hospital. [facility] is not responsible for any missing/lost belongings or valuables.   Notify your doctor if there is any change in your medical condition (cold, fever, infection).  Wear comfortable clothing (specific to your surgery type) to the hospital.  After surgery, you can help prevent lung complications by doing breathing exercises.  Take deep breaths and cough every 1-2 hours. Your doctor may order a device called an Incentive Spirometer to help you take deep breaths. When coughing or sneezing, hold a pillow firmly against your incision with both hands. This is called "splinting." Doing this helps protect your incision. It also decreases belly discomfort.  If you are being admitted to the hospital overnight, leave your suitcase in the car. After surgery it may be brought to your room.  If you are being discharged the day of surgery, you will not be allowed to drive home. You will  need a responsible adult (18 years or older) to drive you home and stay with you that night.   If you are taking public transportation, you will need to have a responsible adult (18 years or older) with you. Please confirm with your physician that it is acceptable to use public transportation.   Please call the [place] [facility] at [phone] if you have any questions about these instructions.  Surgery Visitation  Policy:  Patients undergoing a surgery or procedure may have two family members or support persons with them as long as the person is not COVID-19 positive or experiencing its symptoms.   Inpatient Visitation:    Visiting hours are 7 a.m. to 8 p.m. Up to four visitors are allowed at one time in a patient room, including children. The visitors may rotate out with other people during the day. One designated support person (adult) may remain overnight.

## 2022-07-15 ENCOUNTER — Telehealth: Payer: Self-pay

## 2022-07-15 NOTE — Telephone Encounter (Signed)
Rescheduled to 07/30/2022 for his surgery due to Dr. Bernardo Heater being sick. Patient aware and new information letter mailed to patient.

## 2022-07-25 ENCOUNTER — Encounter: Payer: Medicare (Managed Care) | Admitting: Urology

## 2022-07-25 ENCOUNTER — Encounter
Admission: RE | Admit: 2022-07-25 | Discharge: 2022-07-25 | Disposition: A | Discharge status: Home / Self Care | Payer: Medicare (Managed Care) | Source: Ambulatory Visit | Attending: Urology | Admitting: Urology

## 2022-07-25 VITALS — Ht 63.0 in | Wt 147.0 lb

## 2022-07-25 DIAGNOSIS — Z01812 Encounter for preprocedural laboratory examination: Secondary | ICD-10-CM

## 2022-07-25 NOTE — Patient Instructions (Signed)
Your procedure is scheduled on: Tuesday July 30, 2022. Report to Day Surgery inside [facility] 2nd floor, stop by registration desk before getting on elevator. Called upstairs to confirm that patient cannot have time changed, has transportation set up with [employer].   Remember: Instructions that are not followed completely may result in serious medical risk,  up to and including death, or upon the discretion of your surgeon and anesthesiologist your  surgery may need to be rescheduled.     _X__ 1. Do not eat food after midnight the night before your procedure.                 No chewing gum or hard candies.  __X__2.  On the morning of surgery brush your teeth with toothpaste and water, you                may rinse your mouth with mouthwash if you wish.  Do not swallow any toothpaste or mouthwash.     _X__ 3.  No Alcohol for 24 hours before or after surgery.   _X__ 4.  Do Not Smoke or use e-cigarettes For 24 Hours Prior to Your Surgery.                 Do not use any chewable tobacco products for at least 6 hours prior to                 Surgery.  _X__  5.  Do not use any recreational drugs (marijuana, cocaine, heroin, ecstasy, MDMA or other)                For at least one week prior to your surgery.  Combination of these drugs with anesthesia                May have life threatening results.  ____  6.  Bring all medications with you on the day of surgery if instructed.   __X__  7.  Notify your doctor if there is any change in your medical condition      (cold, fever, infections).     Do not wear jewelry, make-up, hairpins, clips or nail polish. Do not wear lotions, powders, or perfumes. You may wear deodorant. Do not shave 48 hours prior to surgery. Men may shave face and neck. Do not bring valuables to the hospital.    [facility] is not responsible for any belongings or valuables.  Contacts, dentures or bridgework may not be worn into surgery. Leave  your suitcase in the car. After surgery it may be brought to your room. For patients admitted to the hospital, discharge time is determined by your treatment team.   Patients discharged the day of surgery will not be allowed to drive home.   Make arrangements for someone to be with you for the first 24 hours of your Same Day Discharge.   __X__ Take these medicines the morning of surgery with A SIP OF WATER:    1. amLODipine (NORVASC) 2.5 MG   2. atorvastatin (LIPITOR) 40 MG   3. baclofen (LIORESAL) 10 MG  4. escitalopram (LEXAPRO) 10 MG   5. risperiDONE (RISPERDAL) 0.5 MG  6.  ____ Fleet Enema (as directed)   ____ Use CHG Soap (or wipes) as directed  ____ Use Benzoyl Peroxide Gel as instructed  ____ Use inhalers on the day of surgery  ____ Stop metformin 2 days prior to surgery    ____ Take 1/2 of usual insulin dose the  night before surgery. No insulin the morning          of surgery.   ____ Call your PCP, cardiologist, or Pulmonologist if taking Coumadin/Plavix/aspirin and ask when to stop before your surgery.   __X__ One Week prior to surgery- Stop Anti-inflammatories such as Ibuprofen, Aleve, Advil, Motrin, meloxicam (MOBIC), diclofenac, etodolac, ketorolac, Toradol, Daypro, piroxicam, Goody's or BC powders. OK TO USE TYLENOL IF NEEDED   __X__ Stop supplements until after surgery.    ____ Bring C-Pap to the hospital.    If you have any questions regarding your pre-procedure instructions,  Please call Pre-admit Testing at [phone]

## 2022-07-30 ENCOUNTER — Ambulatory Visit: Payer: Medicare (Managed Care)

## 2022-07-30 ENCOUNTER — Ambulatory Visit: Payer: Medicare (Managed Care) | Admitting: Urgent Care

## 2022-07-30 ENCOUNTER — Encounter
Admission: RE | Disposition: A | Discharge status: Home / Self Care | Payer: Self-pay | Source: Home / Self Care | Attending: Urology

## 2022-07-30 ENCOUNTER — Other Ambulatory Visit: Payer: Self-pay

## 2022-07-30 ENCOUNTER — Encounter: Payer: Self-pay | Admitting: Urology

## 2022-07-30 ENCOUNTER — Ambulatory Visit
Admission: RE | Admit: 2022-07-30 | Discharge: 2022-07-30 | Disposition: A | Discharge status: Home / Self Care | Payer: Medicare (Managed Care) | Attending: Urology | Admitting: Urology

## 2022-07-30 DIAGNOSIS — I10 Essential (primary) hypertension: Secondary | ICD-10-CM | POA: Diagnosis not present

## 2022-07-30 DIAGNOSIS — R7303 Prediabetes: Secondary | ICD-10-CM | POA: Insufficient documentation

## 2022-07-30 DIAGNOSIS — F17210 Nicotine dependence, cigarettes, uncomplicated: Secondary | ICD-10-CM | POA: Diagnosis not present

## 2022-07-30 DIAGNOSIS — H539 Unspecified visual disturbance: Secondary | ICD-10-CM | POA: Insufficient documentation

## 2022-07-30 DIAGNOSIS — N2889 Other specified disorders of kidney and ureter: Secondary | ICD-10-CM

## 2022-07-30 DIAGNOSIS — Z01812 Encounter for preprocedural laboratory examination: Secondary | ICD-10-CM

## 2022-07-30 DIAGNOSIS — I739 Peripheral vascular disease, unspecified: Secondary | ICD-10-CM | POA: Insufficient documentation

## 2022-07-30 DIAGNOSIS — I672 Cerebral atherosclerosis: Secondary | ICD-10-CM | POA: Diagnosis not present

## 2022-07-30 DIAGNOSIS — R2689 Other abnormalities of gait and mobility: Secondary | ICD-10-CM | POA: Diagnosis not present

## 2022-07-30 DIAGNOSIS — I6523 Occlusion and stenosis of bilateral carotid arteries: Secondary | ICD-10-CM | POA: Insufficient documentation

## 2022-07-30 DIAGNOSIS — C651 Malignant neoplasm of right renal pelvis: Secondary | ICD-10-CM | POA: Insufficient documentation

## 2022-07-30 DIAGNOSIS — Z7982 Long term (current) use of aspirin: Secondary | ICD-10-CM | POA: Diagnosis not present

## 2022-07-30 DIAGNOSIS — I69398 Other sequelae of cerebral infarction: Secondary | ICD-10-CM | POA: Diagnosis not present

## 2022-07-30 DIAGNOSIS — Z7902 Long term (current) use of antithrombotics/antiplatelets: Secondary | ICD-10-CM | POA: Diagnosis not present

## 2022-07-30 DIAGNOSIS — F319 Bipolar disorder, unspecified: Secondary | ICD-10-CM | POA: Diagnosis not present

## 2022-07-30 DIAGNOSIS — C641 Malignant neoplasm of right kidney, except renal pelvis: Secondary | ICD-10-CM

## 2022-07-30 DIAGNOSIS — F419 Anxiety disorder, unspecified: Secondary | ICD-10-CM | POA: Diagnosis not present

## 2022-07-30 HISTORY — PX: URETEROSCOPY: SHX842

## 2022-07-30 HISTORY — PX: CYSTOSCOPY W/ URETERAL STENT PLACEMENT: SHX1429

## 2022-07-30 HISTORY — PX: URETERAL BIOPSY: SHX6688

## 2022-07-30 LAB — POCT I-STAT, CHEM 8
BUN: 16 mg/dL (ref 8–23)
Calcium, Ion: 1.17 mmol/L (ref 1.15–1.40)
Chloride: 100 mmol/L (ref 98–111)
Creatinine, Ser: 1 mg/dL (ref 0.61–1.24)
Glucose, Bld: 138 mg/dL — ABNORMAL HIGH (ref 70–99)
HCT: 41 % (ref 39.0–52.0)
Hemoglobin: 13.9 g/dL (ref 13.0–17.0)
Potassium: 3.4 mmol/L — ABNORMAL LOW (ref 3.5–5.1)
Sodium: 140 mmol/L (ref 135–145)
TCO2: 29 mmol/L (ref 22–32)

## 2022-07-30 SURGERY — URETEROSCOPY
Anesthesia: General | Site: Ureter | Laterality: Right

## 2022-07-30 MED ORDER — LACTATED RINGERS IV SOLN: INTRAVENOUS | Status: DC

## 2022-07-30 MED ORDER — FENTANYL CITRATE (PF) 100 MCG/2ML IJ SOLN
INTRAMUSCULAR | Status: AC
  Filled 2022-07-30: qty 2

## 2022-07-30 MED ORDER — KETAMINE HCL 50 MG/5ML IJ SOSY
PREFILLED_SYRINGE | INTRAMUSCULAR | Status: AC
  Filled 2022-07-30: qty 5

## 2022-07-30 MED ORDER — SODIUM CHLORIDE 0.9 % IR SOLN
Status: DC | PRN
  Administered 2022-07-30: 700 mL

## 2022-07-30 MED ORDER — ACETAMINOPHEN 10 MG/ML IV SOLN
INTRAVENOUS | Status: DC | PRN
  Administered 2022-07-30: 1000 mg via INTRAVENOUS

## 2022-07-30 MED ORDER — FENTANYL CITRATE (PF) 100 MCG/2ML IJ SOLN
INTRAMUSCULAR | Status: DC | PRN
  Administered 2022-07-30 (×4): 25 ug via INTRAVENOUS

## 2022-07-30 MED ORDER — PHENYLEPHRINE HCL-NACL 20-0.9 MG/250ML-% IV SOLN
INTRAVENOUS | Status: AC
  Filled 2022-07-30: qty 250

## 2022-07-30 MED ORDER — ROCURONIUM BROMIDE 100 MG/10ML IV SOLN
INTRAVENOUS | Status: DC | PRN
  Administered 2022-07-30: 50 mg via INTRAVENOUS

## 2022-07-30 MED ORDER — ACETAMINOPHEN 10 MG/ML IV SOLN
INTRAVENOUS | Status: AC
  Filled 2022-07-30: qty 100

## 2022-07-30 MED ORDER — PHENYLEPHRINE 80 MCG/ML (10ML) SYRINGE FOR IV PUSH (FOR BLOOD PRESSURE SUPPORT)
PREFILLED_SYRINGE | INTRAVENOUS | Status: DC | PRN
  Administered 2022-07-30 (×2): 80 ug via INTRAVENOUS
  Administered 2022-07-30 (×2): 160 ug via INTRAVENOUS
  Administered 2022-07-30: 240 ug via INTRAVENOUS
  Administered 2022-07-30: 160 ug via INTRAVENOUS
  Administered 2022-07-30: 240 ug via INTRAVENOUS

## 2022-07-30 MED ORDER — PROPOFOL 10 MG/ML IV BOLUS
INTRAVENOUS | Status: DC | PRN
  Administered 2022-07-30: 180 mg via INTRAVENOUS

## 2022-07-30 MED ORDER — TROSPIUM CHLORIDE 20 MG PO TABS: 20.0000 mg | ORAL_TABLET | Freq: Two times a day (BID) | ORAL | 0 refills | Status: DC | PRN

## 2022-07-30 MED ORDER — CEFAZOLIN SODIUM-DEXTROSE 2-4 GM/100ML-% IV SOLN
INTRAVENOUS | Status: AC
  Filled 2022-07-30: qty 100

## 2022-07-30 MED ORDER — LIDOCAINE HCL (CARDIAC) PF 100 MG/5ML IV SOSY
PREFILLED_SYRINGE | INTRAVENOUS | Status: DC | PRN
  Administered 2022-07-30: 100 mg via INTRAVENOUS

## 2022-07-30 MED ORDER — PHENYLEPHRINE HCL-NACL 20-0.9 MG/250ML-% IV SOLN
INTRAVENOUS | Status: DC | PRN
  Administered 2022-07-30: 20 ug/min via INTRAVENOUS

## 2022-07-30 MED ORDER — ACETAMINOPHEN 160 MG/5ML PO SOLN: 325.0000 mg | ORAL | Status: DC | PRN

## 2022-07-30 MED ORDER — LIDOCAINE HCL (PF) 2 % IJ SOLN
INTRAMUSCULAR | Status: AC
  Filled 2022-07-30: qty 5

## 2022-07-30 MED ORDER — EPHEDRINE 5 MG/ML INJ
INTRAVENOUS | Status: AC
  Filled 2022-07-30: qty 5

## 2022-07-30 MED ORDER — KETAMINE HCL 10 MG/ML IJ SOLN
INTRAMUSCULAR | Status: DC | PRN
  Administered 2022-07-30: 20 mg via INTRAVENOUS

## 2022-07-30 MED ORDER — SUGAMMADEX SODIUM 500 MG/5ML IV SOLN
INTRAVENOUS | Status: AC
  Filled 2022-07-30: qty 5

## 2022-07-30 MED ORDER — CEFAZOLIN SODIUM-DEXTROSE 2-4 GM/100ML-% IV SOLN
2.0000 g | INTRAVENOUS | Status: AC
  Administered 2022-07-30: 2 g via INTRAVENOUS

## 2022-07-30 MED ORDER — FAMOTIDINE 20 MG PO TABS
ORAL_TABLET | ORAL | Status: AC
  Administered 2022-07-30: 20 mg via ORAL
  Filled 2022-07-30: qty 1

## 2022-07-30 MED ORDER — DEXAMETHASONE SODIUM PHOSPHATE 10 MG/ML IJ SOLN
INTRAMUSCULAR | Status: AC
  Filled 2022-07-30: qty 1

## 2022-07-30 MED ORDER — ACETAMINOPHEN 325 MG PO TABS: 325.0000 mg | ORAL_TABLET | ORAL | Status: DC | PRN

## 2022-07-30 MED ORDER — CHLORHEXIDINE GLUCONATE 0.12 % MT SOLN
OROMUCOSAL | Status: AC
  Administered 2022-07-30: 15 mL via OROMUCOSAL
  Filled 2022-07-30: qty 15

## 2022-07-30 MED ORDER — ROCURONIUM BROMIDE 10 MG/ML (PF) SYRINGE
PREFILLED_SYRINGE | INTRAVENOUS | Status: AC
  Filled 2022-07-30: qty 10

## 2022-07-30 MED ORDER — FENTANYL CITRATE (PF) 100 MCG/2ML IJ SOLN: 25.0000 ug | INTRAMUSCULAR | Status: DC | PRN

## 2022-07-30 MED ORDER — DEXAMETHASONE SODIUM PHOSPHATE 10 MG/ML IJ SOLN
INTRAMUSCULAR | Status: DC | PRN
  Administered 2022-07-30: 5 mg via INTRAVENOUS

## 2022-07-30 MED ORDER — MIDAZOLAM HCL 2 MG/2ML IJ SOLN
INTRAMUSCULAR | Status: DC | PRN
  Administered 2022-07-30: 2 mg via INTRAVENOUS

## 2022-07-30 MED ORDER — EPHEDRINE SULFATE (PRESSORS) 50 MG/ML IJ SOLN
INTRAMUSCULAR | Status: DC | PRN
  Administered 2022-07-30: 5 mg via INTRAVENOUS
  Administered 2022-07-30 (×2): 10 mg via INTRAVENOUS

## 2022-07-30 MED ORDER — ONDANSETRON HCL 4 MG/2ML IJ SOLN
INTRAMUSCULAR | Status: AC
  Filled 2022-07-30: qty 2

## 2022-07-30 MED ORDER — MIDAZOLAM HCL 2 MG/2ML IJ SOLN
INTRAMUSCULAR | Status: AC
  Filled 2022-07-30: qty 2

## 2022-07-30 MED ORDER — ORAL CARE MOUTH RINSE: 15.0000 mL | Freq: Once | OROMUCOSAL | Status: AC

## 2022-07-30 MED ORDER — SUGAMMADEX SODIUM 200 MG/2ML IV SOLN
INTRAVENOUS | Status: DC | PRN
  Administered 2022-07-30: 300 mg via INTRAVENOUS

## 2022-07-30 MED ORDER — FAMOTIDINE 20 MG PO TABS: 20.0000 mg | ORAL_TABLET | Freq: Once | ORAL | Status: AC

## 2022-07-30 MED ORDER — HYDROCODONE-ACETAMINOPHEN 7.5-325 MG PO TABS
1.0000 | ORAL_TABLET | Freq: Once | ORAL | Status: DC | PRN
  Filled 2022-07-30: qty 1

## 2022-07-30 MED ORDER — PROPOFOL 10 MG/ML IV BOLUS
INTRAVENOUS | Status: AC
  Filled 2022-07-30: qty 20

## 2022-07-30 MED ORDER — HYDROCODONE-ACETAMINOPHEN 5-325 MG PO TABS: 1.0000 | ORAL_TABLET | Freq: Three times a day (TID) | ORAL | 0 refills | Status: DC | PRN

## 2022-07-30 MED ORDER — ONDANSETRON HCL 4 MG/2ML IJ SOLN: 4.0000 mg | Freq: Once | INTRAMUSCULAR | Status: DC | PRN

## 2022-07-30 MED ORDER — CHLORHEXIDINE GLUCONATE 0.12 % MT SOLN: 15.0000 mL | Freq: Once | OROMUCOSAL | Status: AC

## 2022-07-30 MED ORDER — IOHEXOL 180 MG/ML  SOLN
INTRAMUSCULAR | Status: DC | PRN
  Administered 2022-07-30: 10 mL

## 2022-07-30 MED ORDER — ONDANSETRON HCL 4 MG/2ML IJ SOLN
INTRAMUSCULAR | Status: DC | PRN
  Administered 2022-07-30: 4 mg via INTRAVENOUS

## 2022-07-30 MED ORDER — PHENYLEPHRINE 80 MCG/ML (10ML) SYRINGE FOR IV PUSH (FOR BLOOD PRESSURE SUPPORT)
PREFILLED_SYRINGE | INTRAVENOUS | Status: AC
  Filled 2022-07-30: qty 10

## 2022-07-30 SURGICAL SUPPLY — 38 items
BAG DRAIN SIEMENS DORNER NS (MISCELLANEOUS) ×2 IMPLANT
BAG DRN NS LF (MISCELLANEOUS) ×2
BAG PRESSURE INF REUSE 3000 (BAG) ×2 IMPLANT
BASKET STONE 3X4X90X20 (MISCELLANEOUS) IMPLANT
BRUSH SCRUB EZ 1% IODOPHOR (MISCELLANEOUS) ×2 IMPLANT
BSKT STON RTRVL 90X20 3FR (MISCELLANEOUS) ×2
CATH URET FLEX-TIP 2 LUMEN 10F (CATHETERS) ×2 IMPLANT
CATH URETL OPEN 5X70 (CATHETERS) ×2 IMPLANT
CATH URETL OPEN END 6FR 70 (CATHETERS) IMPLANT
CNTNR SPEC 2.5X3XGRAD LEK (MISCELLANEOUS) ×2
CONT SPEC 4OZ STER OR WHT (MISCELLANEOUS) ×2
CONT SPEC 4OZ STRL OR WHT (MISCELLANEOUS) ×2
CONTAINER SPEC 2.5X3XGRAD LEK (MISCELLANEOUS) ×2 IMPLANT
DRSG TELFA 3X4 N-ADH STERILE (GAUZE/BANDAGES/DRESSINGS) ×2 IMPLANT
EXTRACTOR STONE NITINOL NGAGE (UROLOGICAL SUPPLIES) IMPLANT
FORCEPS BIOP PIRANHA Y (CUTTING FORCEPS) IMPLANT
GAUZE 4X4 16PLY ~~LOC~~+RFID DBL (SPONGE) ×4 IMPLANT
GLOVE SURG UNDER POLY LF SZ7.5 (GLOVE) ×2 IMPLANT
GOWN STRL REUS W/ TWL LRG LVL3 (GOWN DISPOSABLE) ×4 IMPLANT
GOWN STRL REUS W/ TWL XL LVL3 (GOWN DISPOSABLE) ×2 IMPLANT
GOWN STRL REUS W/TWL LRG LVL3 (GOWN DISPOSABLE) ×4
GOWN STRL REUS W/TWL XL LVL3 (GOWN DISPOSABLE) ×2
GUIDEWIRE GREEN .038 145CM (MISCELLANEOUS) ×2 IMPLANT
GUIDEWIRE STR DUAL SENSOR (WIRE) ×4 IMPLANT
IV NS IRRIG 3000ML ARTHROMATIC (IV SOLUTION) ×2 IMPLANT
KIT TURNOVER CYSTO (KITS) ×2 IMPLANT
MANIFOLD NEPTUNE II (INSTRUMENTS) ×2 IMPLANT
NDL HYPO 18GX1.5 BLUNT FILL (NEEDLE) IMPLANT
NDL SAFETY ECLIP 18X1.5 (MISCELLANEOUS) ×2 IMPLANT
NEEDLE HYPO 18GX1.5 BLUNT FILL (NEEDLE) ×2 IMPLANT
PACK CYSTO AR (MISCELLANEOUS) ×2 IMPLANT
SET CYSTO W/LG BORE CLAMP LF (SET/KITS/TRAYS/PACK) ×2 IMPLANT
SHEATH NAVIGATOR HD 12/14X36 (SHEATH) ×2 IMPLANT
STENT URET 6FRX24 CONTOUR (STENTS) IMPLANT
SURGILUBE 2OZ TUBE FLIPTOP (MISCELLANEOUS) ×2 IMPLANT
TRAP FLUID SMOKE EVACUATOR (MISCELLANEOUS) ×2 IMPLANT
WATER STERILE IRR 1000ML POUR (IV SOLUTION) ×2 IMPLANT
WATER STERILE IRR 500ML POUR (IV SOLUTION) ×2 IMPLANT

## 2022-07-30 NOTE — H&P (Signed)
Urology H&P   History of Present Illness: [name] is a 76 y.o. male with a history of chronic gross hematuria and right renal pelvic mass on CT.  Initially underwent ureteroscopy 06/18/2022 however due to ureteral anatomy the renal pelvis was unable to be accessed with the ureteroscope.  He presents for follow-up ureteroscopy with biopsy renal pelvic mass.  Past Medical History:  Diagnosis Date   Acute right PCA stroke ([place]) 03/12/2018   a.) residual loss of peripheral vision and balance issues   Anemia    Anxiety    Bilateral carotid artery stenosis    Cerebral atherosclerosis 03/13/2018   a.) CTA neck 03/13/2018: proximal inferior division RIGHT M2 segment stenosis with normal distal opacification; mod stenosis of the distal RIGHT P2 segment (likely culprit of 03/12/2018 CVA); diffuse small vessel Dz   Cervical spondylosis    Chronic back pain    a.) followed by pain mgmt at [facility]   DDD (degenerative disc disease), lumbar    Depression    GERD (gastroesophageal reflux disease)    HCV (hepatitis C virus)    a.) s/p Tx with ledipasvir/sofosbuvir   History of kidney stones    Hypertension    Leg ulcer, left ([place]) 2023   has been seen at the wound clinic   Lesion of BILATERAL parotid glands 03/13/2018   a.) CTA neck 03/13/2018: RIGHT (1.3 x 1.8 x 2.4 cm) and LEFT (1.5 x 0.9 1.2 cm); b.) CT soft tissue neck 04/05/2021: RIGHT (2.3 x 1.6 cm) and LEFT (2.3 x 1.6 cm)   Long term current use of antithrombotics/antiplatelets    a.) daily DAPT therapy (ASA + clopidogrel)   Overdose of benzodiazepine    Overuse of medication 02/07/2022   a.) admitted to [facility] with decreased LOC secondary to overuse of "Delta 8 THC gummies"   Peripheral vascular disease ([place])    Polysubstance abuse ([place])    a.) marijuana + cocaine + heroin + BZO   Pre-diabetes    Right-sided Bell's palsy    Unresponsive episode 02/07/2022   Unsteady gait    a.) following CVA in 2019; uses ambulation aid (walker)  for balance   Wears dentures    Has full upper.  Does not wear.    Past Surgical History:  Procedure Laterality Date   ANTERIOR LUMBAR FUSION  05/13/2012   Procedure: ANTERIOR LUMBAR FUSION 1 LEVEL;  Surgeon: Melina Schools, MD;  Location: [place];  Service: Orthopedics;  Laterality: N/A;  ALIF L5-S1   CATARACT EXTRACTION W/PHACO Left 01/01/2021   Procedure: CATARACT EXTRACTION PHACO AND INTRAOCULAR LENS PLACEMENT (IOC) LEFT DIABETIC 2.28 00:24.7;  Surgeon: Eulogio Bear, MD;  Location: [place];  Service: Ophthalmology;  Laterality: Left;  covid + 11-30-20   CATARACT EXTRACTION W/PHACO Right 01/15/2021   Procedure: CATARACT EXTRACTION PHACO AND INTRAOCULAR LENS PLACEMENT ([place]) RIGHT DIABETIC;  Surgeon: Eulogio Bear, MD;  Location: [place];  Service: Ophthalmology;  Laterality: Right;  2.62 0:34.5   CHOLECYSTECTOMY     COLONOSCOPY WITH PROPOFOL N/A 09/19/2021   Procedure: COLONOSCOPY WITH PROPOFOL;  Surgeon: Toledo, Benay Pike, MD;  Location: [facility];  Service: Gastroenterology;  Laterality: N/A;   CYSTOSCOPY W/ RETROGRADES Left 08/04/2018   Procedure: CYSTOSCOPY WITH RETROGRADE PYELOGRAM;  Surgeon: Abbie Sons, MD;  Location: [facility];  Service: Urology;  Laterality: Left;   CYSTOSCOPY WITH INSERTION OF UROLIFT N/A 01/18/2020   Procedure: CYSTOSCOPY WITH INSERTION OF UROLIFT;  Surgeon: Abbie Sons, MD;  Location: [facility];  Service: Urology;  Laterality: N/A;   CYSTOSCOPY WITH RETROGRADE PYELOGRAM, URETEROSCOPY AND STENT PLACEMENT Left 06/18/2022   Procedure: CYSTOSCOPY WITH RETROGRADE PYELOGRAM;  Surgeon: Abbie Sons, MD;  Location: [facility];  Service: Urology;  Laterality: Left;   CYSTOSCOPY WITH STENT PLACEMENT Left 08/04/2018   Procedure: CYSTOSCOPY WITH STENT PLACEMENT;  Surgeon: Abbie Sons, MD;  Location: [facility];  Service: Urology;  Laterality: Left;   CYSTOSCOPY WITH URETEROSCOPY AND STENT PLACEMENT Right 06/18/2022    Procedure: CYSTOSCOPY WITH URETEROSCOPY AND STENT PLACEMENT;  Surgeon: Abbie Sons, MD;  Location: [facility];  Service: Urology;  Laterality: Right;   EXTRACORPOREAL SHOCK WAVE LITHOTRIPSY  2012   EXTRACORPOREAL SHOCK WAVE LITHOTRIPSY Left 08/20/2018   Procedure: EXTRACORPOREAL SHOCK WAVE LITHOTRIPSY (ESWL);  Surgeon: Hollice Espy, MD;  Location: [facility];  Service: Urology;  Laterality: Left; (cancelled)   EXTRACORPOREAL SHOCK WAVE LITHOTRIPSY Left 08/27/2018   Procedure: EXTRACORPOREAL SHOCK WAVE LITHOTRIPSY (ESWL);  Surgeon: Abbie Sons, MD;  Location: [facility];  Service: Urology;  Laterality: Left;   EXTRACORPOREAL SHOCK WAVE LITHOTRIPSY Right 10/15/2018   Procedure: EXTRACORPOREAL SHOCK WAVE LITHOTRIPSY (ESWL);  Surgeon: Billey Co, MD;  Location: [facility];  Service: Urology;  Laterality: Right;   KIDNEY STONE SURGERY      Home Medications:  Current Meds  Medication Sig   amLODipine (NORVASC) 2.5 MG tablet Take 2.5 mg by mouth every morning.   aspirin EC 81 MG EC tablet Take 1 tablet (81 mg total) by mouth daily. Swallow whole.   atorvastatin (LIPITOR) 40 MG tablet Take 40 mg by mouth every morning.   baclofen (LIORESAL) 10 MG tablet Take 10 mg by mouth 3 (three) times daily.   clopidogrel (PLAVIX) 75 MG tablet Take 75 mg by mouth daily.   escitalopram (LEXAPRO) 10 MG tablet Take 10 mg by mouth every morning.   folic acid (FOLVITE) 1 MG tablet Take 1 tablet (1 mg total) by mouth daily.   furosemide (LASIX) 20 MG tablet Take 20 mg by mouth daily.   Meth-Hyo-M Bl-Na Phos-Ph Sal (URIBEL) 118 MG CAPS Take 1 capsule (118 mg total) by mouth 4 (four) times daily as needed.   mirabegron ER (MYRBETRIQ) 50 MG TB24 tablet Take 1 tablet (50 mg total) by mouth daily.   QUEtiapine (SEROQUEL) 300 MG tablet Take 300 mg by mouth at bedtime.   risperiDONE (RISPERDAL) 0.5 MG tablet Take 0.5 mg by mouth every morning.   tamsulosin (FLOMAX) 0.4 MG CAPS capsule Take 1 capsule (0.4 mg  total) by mouth daily.   traZODone (DESYREL) 50 MG tablet Take 50 mg by mouth at bedtime.    Allergies: No Known Allergies  Family History  Problem Relation Age of Onset   Stroke Mother    Diabetes Mother    Lung cancer Father    Diabetes Father     Social History:  reports that he has been smoking cigarettes. He has a 26.50 pack-year smoking history. He has never used smokeless tobacco. He reports that he does not currently use drugs after having used the following drugs: Heroin, Cocaine, and Marijuana. He reports that he does not drink alcohol.  ROS: A complete review of systems was performed.  All systems are negative except for pertinent findings as noted.  Physical Exam:  Vital signs in last 24 hours: Temp:  [98.8 F (37.1 C)] 98.8 F (37.1 C) (10/10 0615) Pulse Rate:  [81] 81 (10/10 0615) Resp:  [18] 18 (10/10 0615)  BP: (129)/(67) 129/67 (10/10 0615) SpO2:  [99 %] 99 % (10/10 0615) Weight:  [66.7 kg] 66.7 kg (10/10 0615) Constitutional:  Alert and oriented, No acute distress HEENT: [place] AT, moist mucus membranes.  Trachea midline, no masses Cardiovascular: Regular rate and rhythm, no clubbing, cyanosis, or edema. Respiratory: Normal respiratory effort, lungs clear bilaterally GI: Abdomen is soft, nontender, nondistended, no abdominal masses   Laboratory Data:  Recent Labs    07/30/22 0637  HGB 13.9  HCT 41.0   Recent Labs    07/30/22 0637  NA 140  K 3.4*  CL 100  GLUCOSE 138*  BUN 16  CREATININE 1.00   No results for input(s): "LABPT", "INR" in the last 72 hours. No results for input(s): "LABURIN" in the last 72 hours. Results for orders placed or performed in visit on 07/04/22  CULTURE, URINE COMPREHENSIVE     Status: None   Collection Time: 07/04/22 10:36 AM   Specimen: Urine   UR  Result Value Ref Range Status   Urine Culture, Comprehensive Final report  Final   Organism ID, Bacteria Comment  Final    Comment: Mixed urogenital flora 2,000  Colonies/mL   Microscopic Examination     Status: Abnormal   Collection Time: 07/04/22 10:36 AM   Urine  Result Value Ref Range Status   WBC, UA >30 (A) 0 - 5 /hpf Final   RBC, Urine >30 (A) 0 - 2 /hpf Final   Epithelial Cells (non renal) 0-10 0 - 10 /hpf Final   Casts Present (A) None seen /lpf Final   Cast Type Hyaline casts N/A Final   Bacteria, UA Moderate (A) None seen/Few Final     Impression/Plan:  76 y.o. male with a right renal pelvic mass.  Prior ureteroscopy unsuccessful and accessing the renal pelvis due to ureteral anatomy.  He has had an indwelling stent presents for follow-up ureteroscopy with biopsy of renal pelvic mass. The procedure has been discussed including potential risks of bleeding, infection, ureteral injury.  The need for postoperative stent was discussed with the likelihood of stent irritative symptoms.   07/30/2022, 7:19 AM  John Giovanni,  MD

## 2022-07-30 NOTE — Discharge Instructions (Addendum)
AMBULATORY SURGERY  DISCHARGE INSTRUCTIONS   The drugs that you were given will stay in your system until tomorrow so for the next 24 hours you should not:  Drive an automobile Make any legal decisions Drink any alcoholic beverage   You may resume regular meals tomorrow.  Today it is better to start with liquids and gradually work up to solid foods.  You may eat anything you prefer, but it is better to start with liquids, then soup and crackers, and gradually work up to solid foods.   Please notify your doctor immediately if you have any unusual bleeding, trouble breathing, redness and pain at the surgery site, drainage, fever, or pain not relieved by medication.    Additional Instructions:   DISCHARGE INSTRUCTIONS FOR KIDNEY STONE/URETERAL STENT   MEDICATIONS:  1. Resume all your other meds from home.  2.  AZO (over-the-counter) can help with the burning/stinging when you urinate. 3.  Hydrocodone is for moderate/severe pain, Rx was sent to your pharmacy. 4.  Trospium is for bladder spasms.  Rx was sent to pharmacy  ACTIVITY:  1. May resume regular activities in 24 hours. 2. No driving while on narcotic pain medications  3. Drink plenty of water  4. Continue to walk at home - you can still get blood clots when you are at home, so keep active, but don't over do it.  5. May return to work/school tomorrow or when you feel ready     SIGNS/SYMPTOMS TO CALL:  Common postoperative symptoms include urinary frequency, urgency, bladder spasm and blood in the urine  Please call us if you have a fever greater than 101.5, uncontrolled nausea/vomiting, uncontrolled pain, dizziness, unable to urinate, excessively bloody urine, chest pain, shortness of breath, leg swelling, leg pain, or any other concerns or questions.   You can reach [place] at [phone].   FOLLOW-UP:  1. You we will be contacted by our office for an appointment for stent removal       Please contact your  physician with any problems or Same Day Surgery at [phone], Monday through Friday 6 am to 4 pm, or [place] at [facility] number at [phone].

## 2022-07-30 NOTE — Interval H&P Note (Signed)
History and Physical Interval Note:  07/30/2022 7:28 AM  [name]  has presented today for surgery, with the diagnosis of Right Renal Pelvic Mass.  The various methods of treatment have been discussed with the patient and family. After consideration of risks, benefits and other options for treatment, the patient has consented to  Procedure(s): URETEROSCOPY (Right) URETERAL BIOPSY OF RENAL PELVIS (Right) CYSTOSCOPY WITH URETERAL STENT REPLACEMENT (Right) as a surgical intervention.  The patient's history has been reviewed, patient examined, no change in status, stable for surgery.  I have reviewed the patient's chart and labs.  Questions were answered to the patient's satisfaction.     [place] [employer] [place]

## 2022-07-30 NOTE — Anesthesia Postprocedure Evaluation (Signed)
Anesthesia Post Note  Patient: [name]  Procedure(s) Performed: URETEROSCOPY (Right: Penis) URETERAL BIOPSY OF RENAL PELVIS (Right: Ureter) CYSTOSCOPY WITH URETERAL STENT REPLACEMENT (Right: Ureter)  Patient location during evaluation: PACU Anesthesia Type: General Level of consciousness: awake and alert Pain management: pain level controlled Vital Signs Assessment: post-procedure vital signs reviewed and stable Respiratory status: spontaneous breathing, nonlabored ventilation and respiratory function stable Cardiovascular status: blood pressure returned to baseline and stable Postop Assessment: no apparent nausea or vomiting Anesthetic complications: no   No notable events documented.   Last Vitals:  Vitals:   07/30/22 0910 07/30/22 0918  BP:  108/66  Pulse: 76 (!) 59  Resp: 15 16  Temp:  (!) 36.3 C  SpO2: 99% 100%    Last Pain:  Vitals:   07/30/22 0918  TempSrc: Temporal  PainSc: 0-No pain                 Alphonsus Sias

## 2022-07-30 NOTE — Anesthesia Preprocedure Evaluation (Addendum)
Anesthesia Evaluation  Patient identified by MRN, date of birth, ID band Patient awake    Reviewed: Allergy & Precautions, NPO status , Patient's Chart, lab work & pertinent test results  Airway Mallampati: III  TM Distance: >3 FB Neck ROM: full    Dental  (+) Chipped, Missing, Poor Dentition, Edentulous Upper, Partial Lower   Pulmonary neg pulmonary ROS, Current Smoker and Patient abstained from smoking.,    Pulmonary exam normal        Cardiovascular hypertension, + Peripheral Vascular Disease  Normal cardiovascular exam     Neuro/Psych PSYCHIATRIC DISORDERS Anxiety Depression Bipolar Disorder Residual balance and vision issues  Neuromuscular disease CVA, Residual Symptoms    GI/Hepatic Neg liver ROS, GERD  Controlled,(+) Hepatitis -  Endo/Other  negative endocrine ROSdiabetes  Renal/GU Renal disease     Musculoskeletal  (+) Arthritis ,   Abdominal   Peds  Hematology  (+) Blood dyscrasia, anemia ,   Anesthesia Other Findings Past Medical History: 03/12/2018: Acute right PCA stroke ([facility])     Comment:  a.) residual loss of peripheral vision and balance               issues No date: Anemia No date: Anxiety No date: Bilateral carotid artery stenosis 03/13/2018: Cerebral atherosclerosis     Comment:  a.) CTA neck 03/13/2018: proximal inferior division               RIGHT M2 segment stenosis with normal distal               opacification; mod stenosis of the distal RIGHT P2               segment (likely culprit of 03/12/2018 CVA); diffuse small              vessel Dz No date: Cervical spondylosis No date: Chronic back pain     Comment:  a.) followed by pain mgmt at [facility] No date: DDD (degenerative disc disease), lumbar No date: Depression No date: GERD (gastroesophageal reflux disease) No date: HCV (hepatitis C virus)     Comment:  a.) s/p Tx with ledipasvir/sofosbuvir No date: History of kidney stones No  date: Hypertension 2023: Leg ulcer, left ([place])     Comment:  has been seen at the wound clinic 03/13/2018: Lesion of BILATERAL parotid glands     Comment:  a.) CTA neck 03/13/2018: RIGHT (1.3 x 1.8 x 2.4 cm) and               LEFT (1.5 x 0.9 1.2 cm); b.) CT soft tissue neck               04/05/2021: RIGHT (2.3 x 1.6 cm) and LEFT (2.3 x 1.6 cm) No date: Long term current use of antithrombotics/antiplatelets     Comment:  a.) daily DAPT therapy (ASA + clopidogrel) No date: Overdose of benzodiazepine 02/07/2022: Overuse of medication     Comment:  a.) admitted to [facility] with decreased LOC secondary to               overuse of "Delta 8 THC gummies" No date: Peripheral vascular disease ([place]) No date: Polysubstance abuse ([place])     Comment:  a.) marijuana + cocaine + heroin + BZO No date: Pre-diabetes No date: Right-sided Bell's palsy 02/07/2022: Unresponsive episode No date: Unsteady gait     Comment:  a.) following CVA in 2019; uses ambulation aid (walker)  for balance No date: Wears dentures     Comment:  Has full upper.  Does not wear.  Past Surgical History: 05/13/2012: ANTERIOR LUMBAR FUSION     Comment:  Procedure: ANTERIOR LUMBAR FUSION 1 LEVEL;  Surgeon:               Melina Schools, MD;  Location: [place];  Service:               Orthopedics;  Laterality: N/A;  ALIF L5-S1 01/01/2021: CATARACT EXTRACTION W/PHACO; Left     Comment:  Procedure: CATARACT EXTRACTION PHACO AND INTRAOCULAR               LENS PLACEMENT (IOC) LEFT DIABETIC 2.28 00:24.7;                Surgeon: Eulogio Bear, MD;  Location: [place];  Service: Ophthalmology;  Laterality: Left;              covid + 11-30-20 01/15/2021: CATARACT EXTRACTION W/PHACO; Right     Comment:  Procedure: CATARACT EXTRACTION PHACO AND INTRAOCULAR               LENS PLACEMENT (IOC) RIGHT DIABETIC;  Surgeon: Eulogio Bear, MD;  Location: [place];                 Service: Ophthalmology;  Laterality: Right;  2.62 0:34.5 No date: CHOLECYSTECTOMY 09/19/2021: COLONOSCOPY WITH PROPOFOL; N/A     Comment:  Procedure: COLONOSCOPY WITH PROPOFOL;  Surgeon: [place],               Benay Pike, MD;  Location: [facility];  Service:               Gastroenterology;  Laterality: N/A; 08/04/2018: CYSTOSCOPY W/ RETROGRADES; Left     Comment:  Procedure: CYSTOSCOPY WITH RETROGRADE PYELOGRAM;                Surgeon: Abbie Sons, MD;  Location: [facility];                Service: Urology;  Laterality: Left; 01/18/2020: CYSTOSCOPY WITH INSERTION OF UROLIFT; N/A     Comment:  Procedure: CYSTOSCOPY WITH INSERTION OF UROLIFT;                Surgeon: Abbie Sons, MD;  Location: [facility];                Service: Urology;  Laterality: N/A; 06/18/2022: CYSTOSCOPY WITH RETROGRADE PYELOGRAM, URETEROSCOPY AND  STENT PLACEMENT; Left     Comment:  Procedure: CYSTOSCOPY WITH RETROGRADE PYELOGRAM;                Surgeon: Abbie Sons, MD;  Location: [facility];                Service: Urology;  Laterality: Left; 08/04/2018: CYSTOSCOPY WITH STENT PLACEMENT; Left     Comment:  Procedure: CYSTOSCOPY WITH STENT PLACEMENT;  Surgeon:               Abbie Sons, MD;  Location: [facility];  Service:               Urology;  Laterality: Left; 06/18/2022: CYSTOSCOPY WITH URETEROSCOPY AND STENT PLACEMENT; Right     Comment:  Procedure: CYSTOSCOPY WITH URETEROSCOPY AND  STENT               PLACEMENT;  Surgeon: Abbie Sons, MD;  Location:               [facility];  Service: Urology;  Laterality: Right; 2012: EXTRACORPOREAL SHOCK WAVE LITHOTRIPSY 08/20/2018: EXTRACORPOREAL SHOCK WAVE LITHOTRIPSY; Left     Comment:  Procedure: EXTRACORPOREAL SHOCK WAVE LITHOTRIPSY (ESWL);              Surgeon: Hollice Espy, MD;  Location: [facility];                Service: Urology;  Laterality: Left; (cancelled) 08/27/2018: EXTRACORPOREAL SHOCK WAVE LITHOTRIPSY; Left     Comment:  Procedure:  EXTRACORPOREAL SHOCK WAVE LITHOTRIPSY (ESWL);              Surgeon: Abbie Sons, MD;  Location: [facility];                Service: Urology;  Laterality: Left; 10/15/2018: EXTRACORPOREAL SHOCK WAVE LITHOTRIPSY; Right     Comment:  Procedure: EXTRACORPOREAL SHOCK WAVE LITHOTRIPSY (ESWL);              Surgeon: Billey Co, MD;  Location: [facility];                Service: Urology;  Laterality: Right; No date: KIDNEY STONE SURGERY  BMI    Body Mass Index: 26.04 kg/m      Reproductive/Obstetrics negative OB ROS                            Anesthesia Physical Anesthesia Plan  ASA: 3  Anesthesia Plan: General ETT   Post-op Pain Management:    Induction: Intravenous  PONV Risk Score and Plan: 1 and Ondansetron, Dexamethasone, Midazolam and Treatment may vary due to age or medical condition  Airway Management Planned: Oral ETT  Additional Equipment:   Intra-op Plan:   Post-operative Plan: Extubation in OR  Informed Consent: I have reviewed the patients History and Physical, chart, labs and discussed the procedure including the risks, benefits and alternatives for the proposed anesthesia with the patient or authorized representative who has indicated his/her understanding and acceptance.     Dental Advisory Given  Plan Discussed with: Anesthesiologist, CRNA and Surgeon  Anesthesia Plan Comments: (Patient consented for risks of anesthesia including but not limited to:  - adverse reactions to medications - damage to eyes, teeth, lips or other oral mucosa - nerve damage due to positioning  - sore throat or hoarseness - Damage to heart, brain, nerves, lungs, other parts of body or loss of life  Patient voiced understanding.)        Anesthesia Quick Evaluation

## 2022-07-30 NOTE — Transfer of Care (Signed)
Immediate Anesthesia Transfer of Care Note  Patient: [name]  Procedure(s) Performed: URETEROSCOPY (Right: Penis) URETERAL BIOPSY OF RENAL PELVIS (Right: Ureter) CYSTOSCOPY WITH URETERAL STENT REPLACEMENT (Right: Ureter)  Patient Location: PACU  Anesthesia Type:General  Level of Consciousness: drowsy  Airway & Oxygen Therapy: Patient Spontanous Breathing and Patient connected to face mask oxygen  Post-op Assessment: Report given to RN and Post -op Vital signs reviewed and stable  Post vital signs: Reviewed and stable  Last Vitals:  Vitals Value Taken Time  BP 148/67 07/30/22 0831  Temp    Pulse 81 07/30/22 0834  Resp 13 07/30/22 0834  SpO2 100 % 07/30/22 0834  Vitals shown include unvalidated device data.  Last Pain:  Vitals:   07/30/22 0615  TempSrc: Temporal  PainSc: 8          Complications: No notable events documented.

## 2022-07-30 NOTE — Op Note (Signed)
Preoperative diagnosis:  1.  Right renal pelvic mass  Postoperative diagnosis:  1.  Right renal pelvic mass  Procedure:  Cystoscopy Right ureteroscopy with biopsy right renal pelvic mass Right ureteral stent exchange (4F/24 cm)  Right retrograde pyelography with interpretation  Surgeon: Nicki Reaper C. Cici Rodriges, M.D.  Anesthesia: General  Complications: None  Intraoperative findings:  1.  Papillary mass involving lateral renal pelvis and lower pole calyces.  Endoscopically high-grade appearing  2.  Retrograde pyelogram-filling defect lateral renal pelvis and lower pole calyces  EBL: Minimal  Specimens: Biopsies renal pelvic mass Saline washing right renal pelvis   Indication: [name] is a 76 y.o. male with a history of chronic gross hematuria.  CT urogram remarkable for filling defect right renal pelvis.  Underwent ureteroscopy 06/18/2022 however the renal pelvis unable to be accessed secondary to ureteral narrowing.  A stent was placed for passive dilation and he presents today for follow-up ureteroscopy.  After reviewing the management options for treatment, the patient elected to proceed with the above surgical procedure(s). We have discussed the potential benefits and risks of the procedure, side effects of the proposed treatment, the likelihood of the patient achieving the goals of the procedure, and any potential problems that might occur during the procedure or recuperation. Informed consent has been obtained.  Description of procedure:  The patient was taken to the operating room and general anesthesia was induced.  The patient was placed in the dorsal lithotomy position, prepped and draped in the usual sterile fashion, and preoperative antibiotics were administered. A preoperative time-out was performed.   A 21 French cystoscope was lubricated and passed under direct vision.  There were inflammatory changes of the right hemitrigone secondary to the indwelling stent.  No  solid or papillary lesions were identified  The stent was grasped with endoscopic forceps and brought out through the urethral meatus.  A 0.038 Sensor wire was placed through the stent and advanced into the renal pelvis followed by stent removal.  A single channel digital flexible ureteroscope was then passed per urethra and advanced into the bladder.  The ureteroscope was easily placed into the ureter alongside the guidewire and passed up to the renal pelvis.  The ureter was normal in appearance without lesion or stricture.  Retrograde pyelogram was performed through the ureteroscope with findings as described above.  Papillary tumor noted as described above.  3 biopsies were performed with Piranha forceps with good specimen obtained.  An additional biopsy was then performed using an 1.9 [place] N-gage basket with excellent tissue obtained.  Saline washing was then performed in the renal pelvis and sent for cytology.  No significant bleeding was noted and the ureteroscope was removed.  A 6 F/24 cm Contour ureteral stent was placed under fluoroscopic guidance.  The wire was then removed with an adequate stent curl noted in end pole calyx as well as in the bladder.  The bladder was then emptied and the procedure ended.  The patient appeared to tolerate the procedure well and without complications.  After anesthetic reversal the patient was transported to the PACU in stable condition.   Plan: 1.  Patient will be contacted with the pathology results 2.  Cystoscopy with stent removal will be scheduled 7-10 days    John Giovanni, MD

## 2022-07-30 NOTE — Anesthesia Procedure Notes (Signed)
Procedure Name: Intubation Date/Time: 07/30/2022 7:37 AM  Performed by: Lia Foyer, CRNAPre-anesthesia Checklist: Patient identified, Emergency Drugs available, Suction available and Patient being monitored Patient Re-evaluated:Patient Re-evaluated prior to induction Oxygen Delivery Method: Circle system utilized Preoxygenation: Pre-oxygenation with 100% oxygen Induction Type: IV induction Ventilation: Mask ventilation without difficulty Laryngoscope Size: McGraph and 4 Grade View: Grade I Tube type: Oral Tube size: 7.0 mm Number of attempts: 1 Airway Equipment and Method: Stylet, Oral airway and Video-laryngoscopy Placement Confirmation: ETT inserted through vocal cords under direct vision, positive ETCO2 and breath sounds checked- equal and bilateral Secured at: 21 cm Tube secured with: Tape Dental Injury: Teeth and Oropharynx as per pre-operative assessment

## 2022-07-31 ENCOUNTER — Encounter: Payer: Self-pay | Admitting: Urology

## 2022-07-31 ENCOUNTER — Other Ambulatory Visit: Payer: Self-pay | Admitting: Anatomic Pathology & Clinical Pathology

## 2022-07-31 LAB — CYTOLOGY - NON PAP

## 2022-07-31 LAB — SURGICAL PATHOLOGY

## 2022-07-31 NOTE — Progress Notes (Signed)
mdt  

## 2022-08-07 ENCOUNTER — Encounter: Payer: Medicare (Managed Care) | Admitting: Urology

## 2022-08-08 ENCOUNTER — Encounter: Payer: Medicare (Managed Care) | Admitting: Urology

## 2022-08-08 ENCOUNTER — Other Ambulatory Visit: Payer: Medicare (Managed Care)

## 2022-08-08 NOTE — Progress Notes (Signed)
Patient not discussed due to Urology not being present to present him

## 2022-08-09 ENCOUNTER — Telehealth: Payer: Self-pay

## 2022-08-09 ENCOUNTER — Encounter: Payer: Medicare (Managed Care) | Admitting: Urology

## 2022-08-09 NOTE — Telephone Encounter (Signed)
Message left on triage line at 43  Pt states he needs to r/s his surgery. Pt called office phone and spoke to [place]. He needs to r/s his stent removal. Dr. Bernardo Heater and Morey Hummingbird are aware.

## 2022-08-12 NOTE — Telephone Encounter (Signed)
Appointment has been rescheduled.

## 2022-08-14 ENCOUNTER — Encounter: Payer: Self-pay | Admitting: Urology

## 2022-08-14 ENCOUNTER — Ambulatory Visit (INDEPENDENT_AMBULATORY_CARE_PROVIDER_SITE_OTHER): Payer: Medicare (Managed Care) | Admitting: Urology

## 2022-08-14 VITALS — BP 126/78 | HR 105 | Ht 63.0 in | Wt 147.0 lb

## 2022-08-14 DIAGNOSIS — C641 Malignant neoplasm of right kidney, except renal pelvis: Secondary | ICD-10-CM

## 2022-08-14 LAB — URINALYSIS, COMPLETE
Bilirubin, UA: NEGATIVE
Glucose, UA: NEGATIVE
Ketones, UA: NEGATIVE
Nitrite, UA: NEGATIVE
Specific Gravity, UA: 1.015 (ref 1.005–1.030)
Urobilinogen, Ur: 0.2 mg/dL (ref 0.2–1.0)
pH, UA: 5.5 (ref 5.0–7.5)

## 2022-08-14 LAB — MICROSCOPIC EXAMINATION: WBC, UA: >30 /hpf — AB (ref 0–5)

## 2022-08-14 MED ORDER — SULFAMETHOXAZOLE-TRIMETHOPRIM 800-160 MG PO TABS
1.0000 | ORAL_TABLET | Freq: Once | ORAL | Status: AC
  Administered 2022-08-14: 1 via ORAL

## 2022-08-14 NOTE — Progress Notes (Signed)
Indications: Patient is 76 y.o., who is s/p right ureteroscopy 07/30/2022 for a right renal pelvic mass.  Intraoperative findings remarkable for papillary mass involving the lateral renal pelvis and lower pole calyces which endoscopically was high-grade appearing.  Biopsies and cytology was performed.  He has moderate stent symptoms the patient is presenting today for stent removal.  Procedure:  Flexible Cystoscopy with stent removal (20601)  Timeout was performed and the correct patient, procedure and participants were identified.    Description:  The patient was prepped and draped in the usual sterile fashion. Flexible cystosopy was performed.  The stent was visualized, grasped, and removed intact without difficulty. The patient tolerated the procedure well.  A single dose of oral antibiotics was given.  Complications:  None  Plan:  Cytology remarkable for high-grade urothelial carcinoma Biopsies showed papillary urothelial carcinoma, high-grade We discussed the pathology report and that further treatment is recommended. Discussed recommended treatment for high-grade urothelial carcinoma the renal pelvis is nephroureterectomy Significant medical comorbidities and recommended [facility] referral to discuss treatment options

## 2022-08-16 ENCOUNTER — Encounter: Payer: Self-pay | Admitting: Urology

## 2022-08-19 ENCOUNTER — Encounter (INDEPENDENT_AMBULATORY_CARE_PROVIDER_SITE_OTHER): Payer: Self-pay

## 2022-08-21 ENCOUNTER — Encounter: Payer: Medicare (Managed Care) | Admitting: Urology

## 2022-08-29 ENCOUNTER — Telehealth: Payer: Self-pay | Admitting: *Deleted

## 2022-08-29 NOTE — Telephone Encounter (Signed)
I was transferred this phone call from Ona Rathert trying to get family member an appt. For kidney cancer. I looked in the referral and the pt has been referred to urology. I called twice to the office but there was only leave a message. I called back to Ms. Linarez and said that the referral went to urology and so I let the family member know to call the urology office at [phone].

## 2022-08-31 ENCOUNTER — Emergency Department (HOSPITAL_COMMUNITY)
Admission: EM | Admit: 2022-08-31 | Discharge: 2022-08-31 | Discharge status: Against Medical Advice | Payer: Medicare (Managed Care)

## 2022-08-31 ENCOUNTER — Other Ambulatory Visit: Payer: Self-pay

## 2022-08-31 ENCOUNTER — Emergency Department: Payer: Medicare (Managed Care)

## 2022-08-31 ENCOUNTER — Emergency Department
Admission: EM | Admit: 2022-08-31 | Discharge: 2022-09-01 | Disposition: A | Discharge status: Home / Self Care | Payer: Medicare (Managed Care) | Attending: Emergency Medicine | Admitting: Emergency Medicine

## 2022-08-31 DIAGNOSIS — Z8554 Personal history of malignant neoplasm of ureter: Secondary | ICD-10-CM | POA: Diagnosis not present

## 2022-08-31 DIAGNOSIS — N390 Urinary tract infection, site not specified: Secondary | ICD-10-CM | POA: Diagnosis not present

## 2022-08-31 DIAGNOSIS — R319 Hematuria, unspecified: Secondary | ICD-10-CM | POA: Insufficient documentation

## 2022-08-31 DIAGNOSIS — R350 Frequency of micturition: Secondary | ICD-10-CM | POA: Diagnosis present

## 2022-08-31 LAB — CBC WITH DIFFERENTIAL/PLATELET
Abs Immature Granulocytes: 0.03 10*3/uL (ref 0.00–0.07)
Basophils Absolute: 0.1 10*3/uL (ref 0.0–0.1)
Basophils Relative: 1 %
Eosinophils Absolute: 0 10*3/uL (ref 0.0–0.5)
Eosinophils Relative: 0 %
HCT: 45.9 % (ref 39.0–52.0)
Hemoglobin: 15.2 g/dL (ref 13.0–17.0)
Immature Granulocytes: 0 %
Lymphocytes Relative: 19 %
Lymphs Abs: 2.1 10*3/uL (ref 0.7–4.0)
MCH: 29.6 pg (ref 26.0–34.0)
MCHC: 33.1 g/dL (ref 30.0–36.0)
MCV: 89.5 fL (ref 80.0–100.0)
Monocytes Absolute: 0.6 10*3/uL (ref 0.1–1.0)
Monocytes Relative: 5 %
Neutro Abs: 8.3 10*3/uL — ABNORMAL HIGH (ref 1.7–7.7)
Neutrophils Relative %: 75 %
Platelets: 231 10*3/uL (ref 150–400)
RBC: 5.13 MIL/uL (ref 4.22–5.81)
RDW: 13.8 % (ref 11.5–15.5)
WBC: 11 10*3/uL — ABNORMAL HIGH (ref 4.0–10.5)
nRBC: 0 % (ref 0.0–0.2)

## 2022-08-31 LAB — COMPREHENSIVE METABOLIC PANEL
ALT: 21 U/L (ref 0–44)
AST: 22 U/L (ref 15–41)
Albumin: 4.4 g/dL (ref 3.5–5.0)
Alkaline Phosphatase: 85 U/L (ref 38–126)
Anion gap: 12 (ref 5–15)
BUN: 25 mg/dL — ABNORMAL HIGH (ref 8–23)
CO2: 32 mmol/L (ref 22–32)
Calcium: 10.3 mg/dL (ref 8.9–10.3)
Chloride: 93 mmol/L — ABNORMAL LOW (ref 98–111)
Creatinine, Ser: 1.14 mg/dL (ref 0.61–1.24)
GFR, Estimated: >60 mL/min (ref >60)
Glucose, Bld: 155 mg/dL — ABNORMAL HIGH (ref 70–99)
Potassium: 3.3 mmol/L — ABNORMAL LOW (ref 3.5–5.1)
Sodium: 137 mmol/L (ref 135–145)
Total Bilirubin: 0.6 mg/dL (ref 0.3–1.2)
Total Protein: 8.6 g/dL — ABNORMAL HIGH (ref 6.5–8.1)

## 2022-08-31 LAB — URINALYSIS, ROUTINE W REFLEX MICROSCOPIC
Bacteria, UA: NONE SEEN
Bilirubin Urine: NEGATIVE
Glucose, UA: NEGATIVE mg/dL
Ketones, ur: NEGATIVE mg/dL
Leukocytes,Ua: NEGATIVE
Nitrite: NEGATIVE
Protein, ur: 30 mg/dL — AB
RBC / HPF: >50 RBC/hpf — ABNORMAL HIGH (ref 0–5)
Specific Gravity, Urine: 1.014 (ref 1.005–1.030)
WBC, UA: >50 WBC/hpf — ABNORMAL HIGH (ref 0–5)
pH: 6 (ref 5.0–8.0)

## 2022-08-31 MED ORDER — ONDANSETRON HCL 4 MG/2ML IJ SOLN
4.0000 mg | Freq: Once | INTRAMUSCULAR | Status: AC
  Administered 2022-08-31: 4 mg via INTRAVENOUS
  Filled 2022-08-31: qty 2

## 2022-08-31 MED ORDER — SODIUM CHLORIDE 0.9 % IV SOLN
1.0000 g | Freq: Once | INTRAVENOUS | Status: AC
  Administered 2022-08-31: 1 g via INTRAVENOUS
  Filled 2022-08-31: qty 10

## 2022-08-31 MED ORDER — IOHEXOL 300 MG/ML  SOLN
100.0000 mL | Freq: Once | INTRAMUSCULAR | Status: AC | PRN
  Administered 2022-08-31: 100 mL via INTRAVENOUS

## 2022-08-31 MED ORDER — CEPHALEXIN 500 MG PO CAPS: 500.0000 mg | ORAL_CAPSULE | Freq: Three times a day (TID) | ORAL | 0 refills | Status: AC

## 2022-08-31 MED ORDER — MORPHINE SULFATE (PF) 4 MG/ML IV SOLN
4.0000 mg | Freq: Once | INTRAVENOUS | Status: AC
  Administered 2022-08-31: 4 mg via INTRAVENOUS
  Filled 2022-08-31: qty 1

## 2022-08-31 MED ORDER — ONDANSETRON 4 MG PO TBDP: 4.0000 mg | ORAL_TABLET | Freq: Three times a day (TID) | ORAL | 0 refills | Status: DC | PRN

## 2022-08-31 NOTE — ED Provider Triage Note (Signed)
Emergency Medicine Provider Triage Evaluation Note  [name] , a 76 y.o. male h/o urothelial carcinoma of the right kidney was evaluated in triage.  Pt complains of "bladder pain." Reports it began two weeks ago. Has been losing bladder control for 2-3 weeks. No leg pain or back pain. Stoioff recommended [facility] referral for treatment options. +dysuria and hematuria  Review of Systems  Positive: Abd pain, dysuria, hematuria Negative: Fever, back pain, leg weakness  Physical Exam  There were no vitals taken for this visit. Gen:   Awake, no distress   Resp:  Normal effort  MSK:   Moves extremities without difficulty  Other:    Medical Decision Making  Medically screening exam initiated at 4:49 PM.  Appropriate orders placed.  DENG KEMLER was informed that the remainder of the evaluation will be completed by another provider, this initial triage assessment does not replace that evaluation, and the importance of remaining in the ED until their evaluation is complete.     Marquette Old, PA-C 08/31/22 1654

## 2022-08-31 NOTE — ED Provider Notes (Signed)
[facility] Provider Note    Event Date/Time   First MD Initiated Contact with Patient 08/31/22 2120     (approximate)   History   Urinary Frequency and Hematuria   HPI  LEKEITH WULF is a 76 y.o. male patient with urothelial cancer.  He had a stent placed about 3 weeks ago.  Supposed to help him drain his bladder.  He has been having increasing pain in the bladder area and the right lower quadrant of his belly where the stent was put in on the right side he says.  Hematuria urinary frequency and urgency and now not eating.  Patient has a referral to [facility] but has not been out to get there.      Physical Exam   Triage Vital Signs: ED Triage Vitals  Enc Vitals Group     BP 08/31/22 1653 137/61     Pulse Rate 08/31/22 1653 90     Resp 08/31/22 1653 16     Temp 08/31/22 1653 98.7 F (37.1 C)     Temp Source 08/31/22 1653 Oral     SpO2 08/31/22 1653 98 %     Weight 08/31/22 1654 147 lb (66.7 kg)     Height 08/31/22 1654 '5\' 3"'$  (1.6 m)     Head Circumference --      Peak Flow --      Pain Score 08/31/22 1654 10     Pain Loc --      Pain Edu? --      Excl. in [place]? --     Most recent vital signs: Vitals:   08/31/22 2300 08/31/22 2330  BP: 123/61 131/60  Pulse: 73 73  Resp: 17 18  Temp:    SpO2: 98% 94%     General: Awake, no distress.  CV:  Good peripheral perfusion.  Regular rate no audible murmurs Resp:  Normal effort.  Clear Abd:  No distention.  Soft tender in the right lower quadrant right middle abdomen right CVA area    ED Results / Procedures / Treatments   Labs (all labs ordered are listed, but only abnormal results are displayed) Labs Reviewed  URINALYSIS, ROUTINE W REFLEX MICROSCOPIC - Abnormal; Notable for the following components:      Result Value   Color, Urine YELLOW (*)    APPearance HAZY (*)    Hgb urine dipstick SMALL (*)    Protein, ur 30 (*)    RBC / HPF >50 (*)    WBC, UA >50 (*)    Non Squamous Epithelial  PRESENT (*)    All other components within normal limits  CBC WITH DIFFERENTIAL/PLATELET - Abnormal; Notable for the following components:   WBC 11.0 (*)    Neutro Abs 8.3 (*)    All other components within normal limits  COMPREHENSIVE METABOLIC PANEL - Abnormal; Notable for the following components:   Potassium 3.3 (*)    Chloride 93 (*)    Glucose, Bld 155 (*)    BUN 25 (*)    Total Protein 8.6 (*)    All other components within normal limits  URINE CULTURE     EKG     RADIOLOGY CT read by radiology reviewed and interpreted by me shows only the known cancer in the right kidney.  There is no sign of obstruction.  Patient does have a stent in place and what was pulled several days ago by Dr. Bernardo Heater   PROCEDURES:  Critical Care performed:  Procedures   MEDICATIONS ORDERED IN ED: Medications  morphine (PF) 4 MG/ML injection 4 mg (4 mg Intravenous Given 08/31/22 2159)  ondansetron (ZOFRAN) injection 4 mg (4 mg Intravenous Given 08/31/22 2158)  iohexol (OMNIPAQUE) 300 MG/ML solution 100 mL (100 mLs Intravenous Contrast Given 08/31/22 2217)  cefTRIAXone (ROCEPHIN) 1 g in sodium chloride 0.9 % 100 mL IVPB (0 g Intravenous Stopped 08/31/22 2351)     IMPRESSION / MDM / ASSESSMENT AND PLAN / ED COURSE  I reviewed the triage vital signs and the nursing notes.  Discussed patient with urology Call Dr. Alyson Ingles.  We feel that the hematuria and pyuria are most likely due to UTI.  It could be something left over from the stent as well.  I have given him some Rocephin IV and will give him some Keflex p.o.  He now has his referral to the [facility] I discussed this in detail with family he is did not have a problem with payment as I had initially thought when I reviewed the discharge instructions he had a problem with the referral but that is now fixed.  I gave them the charity care information anyway in case it would help them in the future.  Patient wants something more than tramadol for  pain.  I have recommended he take 2 over-the-counter Motrin as 3 times a day for 3 days.  This should help with pain.  It also is a low enough dose for short period of time that that should not adversely affect renal function.  Told him he needs to drink a lot of fluids.  Differential diagnosis includes, but is not limited to, renal colic, kidney stone or infection or complications of his cancer are all possibilities.  Looks like he just has a UTI however.  There is no sign of obstruction kidneys.  Patient's presentation is most consistent with acute complicated illness / injury requiring diagnostic workup.  The patient is on the cardiac monitor to evaluate for evidence of arrhythmia and/or significant heart rate changes.  Has been seen.    FINAL CLINICAL IMPRESSION(S) / ED DIAGNOSES   Final diagnoses:  Urinary tract infection with hematuria, site unspecified     Rx / DC Orders   ED Discharge Orders          Ordered    cephALEXin (KEFLEX) 500 MG capsule  3 times daily        08/31/22 2300    ondansetron (ZOFRAN-ODT) 4 MG disintegrating tablet  Every 8 hours PRN        08/31/22 2300             Note:  This document was prepared using Dragon voice recognition software and may include unintentional dictation errors.   Nena Polio, MD 09/01/22 [phone]

## 2022-08-31 NOTE — ED Notes (Signed)
Pt states his tolerance to narcotics is high but he is more comfortable and that the morphine took the edge off the pain.

## 2022-08-31 NOTE — ED Notes (Signed)
ED Provider at bedside. 

## 2022-08-31 NOTE — Discharge Instructions (Addendum)
It looks like you have a urinary tract infection.  I have given you some IV antibiotics prescription for Keflex 1 pill 3 times a day.  That should take care of the infection.  Also given you prescription for Zofran melt on your tongue wafers 1 3 times a day as needed for nausea.  Please return for increasing pain or fever or vomiting or feeling sicker.  Please try to follow-up with urology at [facility] as Dr. Bernardo Heater had wanted you to.  He should be able to use the contact information he gave you.  [facility] has a Publishing rights manager for people for residence of [place].  That should be able to take care of of your treatment costs.  I have given you a sheet with phone numbers on it to try and get things moving with the charity care program for you.

## 2022-08-31 NOTE — ED Notes (Signed)
Called for pt after finding labels, no response.

## 2022-08-31 NOTE — ED Triage Notes (Signed)
Pt in to ER for pain in bladder and kidney areas. Pt advised this started 3 weeks go. He has started losing control of his bladder. He has had stents placed 3 weeks. Pt was diagnosed with kidney cancer. Pt is here for the pain. Pt advised he was supposed to have a referral to [facility] but has unable to do so. Pt is now not eating and family is worried about his intake. Pt is CAOx4 and in no acute distress.

## 2022-08-31 NOTE — ED Notes (Signed)
Sent blue tube down as extra.

## 2022-09-01 NOTE — ED Notes (Signed)
Signing pad did not work. Pt verbalized understanding of DC instructions.

## 2022-09-02 LAB — URINE CULTURE: Culture: <10000 — AB

## 2022-09-04 ENCOUNTER — Other Ambulatory Visit: Payer: Self-pay | Admitting: Urology

## 2022-09-04 DIAGNOSIS — C641 Malignant neoplasm of right kidney, except renal pelvis: Secondary | ICD-10-CM

## 2022-09-16 ENCOUNTER — Telehealth: Payer: Self-pay | Admitting: Urology

## 2022-09-16 DIAGNOSIS — C641 Malignant neoplasm of right kidney, except renal pelvis: Secondary | ICD-10-CM

## 2022-09-16 NOTE — Telephone Encounter (Signed)
Received phone call from [place] with [facility]. She called in regards to referral for patient. They are requesting that Dr. Bernardo Heater order a CT chest for patient prior to his appointment with them. They see that patient has already had a CT abdomen pelvis, but they would also like him to have CT chest. She requests that patient have this done within the next few weeks, then they can schedule his appointments. Her phone number is [phone]

## 2022-09-16 NOTE — Telephone Encounter (Signed)
Order was placed.

## 2022-09-17 ENCOUNTER — Telehealth: Payer: Self-pay | Admitting: Urology

## 2022-09-17 ENCOUNTER — Ambulatory Visit (INDEPENDENT_AMBULATORY_CARE_PROVIDER_SITE_OTHER): Payer: Medicare (Managed Care) | Admitting: Physician Assistant

## 2022-09-17 ENCOUNTER — Ambulatory Visit
Admission: RE | Admit: 2022-09-17 | Discharge: 2022-09-17 | Disposition: A | Discharge status: Home / Self Care | Payer: Medicare (Managed Care) | Source: Ambulatory Visit | Attending: Urology | Admitting: Urology

## 2022-09-17 VITALS — BP 145/79 | HR 90 | Temp 98.1°F | Ht 63.0 in | Wt 147.0 lb

## 2022-09-17 DIAGNOSIS — G8929 Other chronic pain: Secondary | ICD-10-CM | POA: Diagnosis not present

## 2022-09-17 DIAGNOSIS — C641 Malignant neoplasm of right kidney, except renal pelvis: Secondary | ICD-10-CM | POA: Diagnosis present

## 2022-09-17 DIAGNOSIS — R310 Gross hematuria: Secondary | ICD-10-CM

## 2022-09-17 MED ORDER — TRAMADOL HCL 50 MG PO TABS: 50.0000 mg | ORAL_TABLET | Freq: Two times a day (BID) | ORAL | 0 refills | Status: DC | PRN

## 2022-09-17 MED ORDER — IOHEXOL 300 MG/ML  SOLN
75.0000 mL | Freq: Once | INTRAMUSCULAR | Status: AC | PRN
  Administered 2022-09-17: 75 mL via INTRAVENOUS

## 2022-09-17 NOTE — Patient Instructions (Signed)
The three medications I want you to be taking at home are: -Flomax (tamsulosin) -Uribel -Sanctura (trospium)  Please figure out which of these you are taking and let me know via telephone or MyChart so I may refill the ones you need.  I am sending in a one-time prescription for Tramadol today; Dr. Lavera Guise will need to take over this prescription when you establish care with him.

## 2022-09-17 NOTE — Telephone Encounter (Signed)
Spoke to patient's daughter. Appointment made for today [facility], [facility].

## 2022-09-17 NOTE — Telephone Encounter (Signed)
Called [name] with [facility] and lvm that order for CT was placed. Also, called patient and spoke with daughter to inform her.

## 2022-09-17 NOTE — Telephone Encounter (Signed)
See note

## 2022-09-18 LAB — URINALYSIS, COMPLETE
Bilirubin, UA: NEGATIVE
Glucose, UA: NEGATIVE
Ketones, UA: NEGATIVE
Nitrite, UA: NEGATIVE
Specific Gravity, UA: 1.01 (ref 1.005–1.030)
Urobilinogen, Ur: 0.2 mg/dL (ref 0.2–1.0)
pH, UA: 7 (ref 5.0–7.5)

## 2022-09-18 LAB — MICROSCOPIC EXAMINATION: WBC, UA: >30 /hpf — AB (ref 0–5)

## 2022-09-18 NOTE — Progress Notes (Signed)
09/17/2022 9:57 AM   [name] 06-24-1946 [id number]  CC: Chief Complaint  Patient presents with   Dysuria   HPI: [name] is a 76 y.o. male with high-grade right upper tract urothelial carcinoma awaiting referral to [facility] on 09/26/2022, chronic right low back pain, nephrolithiasis, BPH with LUTS s/p UroLift in 2021, bipolar I disorder with anxiety, and polysubstance abuse who presents today for evaluation of possible UTI.  He is accompanied today by his daughter, who contributes to HPI.  Today he reports severe, constant RLQ pain that he attributes to his upper tract urothelial carcinoma.  He is requesting pain medication.  He also reports occasional dysuria and gross hematuria as well as chills and hot flashes.  He admits that his anxiety has been worsening since his cancer diagnosis.  He has been prescribed tramadol in the past by Dr. Ginette Pitman for management of his chronic right low back pain, which he admits radiates to the RLQ.  He states he has been taking this and muscle relaxers for his pain, and neither help.  Today he reports he no longer sees Dr. Ginette Pitman and he is establishing care with Dr. Lavera Guise on 09/25/2022.  He has been prescribed Flomax, Uribel, and trospium by our clinic previously but he is unsure if he is taking any of these.  A family member manages his medications for him.  CTAP with contrast on 08/31/2022 showed stable right upper tract urothelial tumor with no hydronephrosis.  In-office UA today positive for 1+ blood, 1+ protein, and 1+ leukocytes; urine microscopy with >30 WBCs/HPF, 11-30 RBCs/HPF, and moderate bacteria.  PMH: Past Medical History:  Diagnosis Date   Acute right PCA stroke ([place]) 03/12/2018   a.) residual loss of peripheral vision and balance issues   Anemia    Anxiety    Bilateral carotid artery stenosis    Cerebral atherosclerosis 03/13/2018   a.) CTA neck 03/13/2018: proximal inferior division RIGHT M2 segment stenosis with normal distal  opacification; mod stenosis of the distal RIGHT P2 segment (likely culprit of 03/12/2018 CVA); diffuse small vessel Dz   Cervical spondylosis    Chronic back pain    a.) followed by pain mgmt at [facility]   DDD (degenerative disc disease), lumbar    Depression    GERD (gastroesophageal reflux disease)    HCV (hepatitis C virus)    a.) s/p Tx with ledipasvir/sofosbuvir   History of kidney stones    Hypertension    Leg ulcer, left ([place]) 2023   has been seen at the wound clinic   Lesion of BILATERAL parotid glands 03/13/2018   a.) CTA neck 03/13/2018: RIGHT (1.3 x 1.8 x 2.4 cm) and LEFT (1.5 x 0.9 1.2 cm); b.) CT soft tissue neck 04/05/2021: RIGHT (2.3 x 1.6 cm) and LEFT (2.3 x 1.6 cm)   Long term current use of antithrombotics/antiplatelets    a.) daily DAPT therapy (ASA + clopidogrel)   Overdose of benzodiazepine    Overuse of medication 02/07/2022   a.) admitted to [facility] with decreased LOC secondary to overuse of "Delta 8 THC gummies"   Peripheral vascular disease ([place])    Polysubstance abuse ([place])    a.) marijuana + cocaine + heroin + BZO   Pre-diabetes    Right-sided Bell's palsy    Unresponsive episode 02/07/2022   Unsteady gait    a.) following CVA in 2019; uses ambulation aid (walker) for balance   Wears dentures    Has full upper.  Does not wear.  Surgical History: Past Surgical History:  Procedure Laterality Date   ANTERIOR LUMBAR FUSION  05/13/2012   Procedure: ANTERIOR LUMBAR FUSION 1 LEVEL;  Surgeon: Melina Schools, MD;  Location: [place];  Service: Orthopedics;  Laterality: N/A;  ALIF L5-S1   CATARACT EXTRACTION W/PHACO Left 01/01/2021   Procedure: CATARACT EXTRACTION PHACO AND INTRAOCULAR LENS PLACEMENT (IOC) LEFT DIABETIC 2.28 00:24.7;  Surgeon: Eulogio Bear, MD;  Location: [place];  Service: Ophthalmology;  Laterality: Left;  covid + 11-30-20   CATARACT EXTRACTION W/PHACO Right 01/15/2021   Procedure: CATARACT EXTRACTION PHACO AND INTRAOCULAR LENS  PLACEMENT (Micro) RIGHT DIABETIC;  Surgeon: Eulogio Bear, MD;  Location: [place];  Service: Ophthalmology;  Laterality: Right;  2.62 0:34.5   CHOLECYSTECTOMY     COLONOSCOPY WITH PROPOFOL N/A 09/19/2021   Procedure: COLONOSCOPY WITH PROPOFOL;  Surgeon: Toledo, Benay Pike, MD;  Location: [facility];  Service: Gastroenterology;  Laterality: N/A;   CYSTOSCOPY W/ RETROGRADES Left 08/04/2018   Procedure: CYSTOSCOPY WITH RETROGRADE PYELOGRAM;  Surgeon: Abbie Sons, MD;  Location: [facility];  Service: Urology;  Laterality: Left;   CYSTOSCOPY W/ URETERAL STENT PLACEMENT Right 07/30/2022   Procedure: CYSTOSCOPY WITH URETERAL STENT REPLACEMENT;  Surgeon: Abbie Sons, MD;  Location: [facility];  Service: Urology;  Laterality: Right;   CYSTOSCOPY WITH INSERTION OF UROLIFT N/A 01/18/2020   Procedure: CYSTOSCOPY WITH INSERTION OF UROLIFT;  Surgeon: Abbie Sons, MD;  Location: [facility];  Service: Urology;  Laterality: N/A;   CYSTOSCOPY WITH RETROGRADE PYELOGRAM, URETEROSCOPY AND STENT PLACEMENT Left 06/18/2022   Procedure: CYSTOSCOPY WITH RETROGRADE PYELOGRAM;  Surgeon: Abbie Sons, MD;  Location: [facility];  Service: Urology;  Laterality: Left;   CYSTOSCOPY WITH STENT PLACEMENT Left 08/04/2018   Procedure: CYSTOSCOPY WITH STENT PLACEMENT;  Surgeon: Abbie Sons, MD;  Location: [facility];  Service: Urology;  Laterality: Left;   CYSTOSCOPY WITH URETEROSCOPY AND STENT PLACEMENT Right 06/18/2022   Procedure: CYSTOSCOPY WITH URETEROSCOPY AND STENT PLACEMENT;  Surgeon: Abbie Sons, MD;  Location: [facility];  Service: Urology;  Laterality: Right;   EXTRACORPOREAL SHOCK WAVE LITHOTRIPSY  2012   EXTRACORPOREAL SHOCK WAVE LITHOTRIPSY Left 08/20/2018   Procedure: EXTRACORPOREAL SHOCK WAVE LITHOTRIPSY (ESWL);  Surgeon: Hollice Espy, MD;  Location: [facility];  Service: Urology;  Laterality: Left; (cancelled)   EXTRACORPOREAL SHOCK WAVE LITHOTRIPSY Left 08/27/2018   Procedure:  EXTRACORPOREAL SHOCK WAVE LITHOTRIPSY (ESWL);  Surgeon: Abbie Sons, MD;  Location: [facility];  Service: Urology;  Laterality: Left;   EXTRACORPOREAL SHOCK WAVE LITHOTRIPSY Right 10/15/2018   Procedure: EXTRACORPOREAL SHOCK WAVE LITHOTRIPSY (ESWL);  Surgeon: Billey Co, MD;  Location: [facility];  Service: Urology;  Laterality: Right;   KIDNEY STONE SURGERY     URETERAL BIOPSY Right 07/30/2022   Procedure: URETERAL BIOPSY OF RENAL PELVIS;  Surgeon: Abbie Sons, MD;  Location: [facility];  Service: Urology;  Laterality: Right;   URETEROSCOPY Right 07/30/2022   Procedure: URETEROSCOPY;  Surgeon: Abbie Sons, MD;  Location: [facility];  Service: Urology;  Laterality: Right;    Home Medications:  Allergies as of 09/17/2022   No Known Allergies      Medication List        Accurate as of September 17, 2022 11:59 PM. If you have any questions, ask your nurse or doctor.          amLODipine 2.5 MG tablet Commonly known as: NORVASC Take 2.5 mg by mouth every morning.   aspirin EC 81  MG tablet Take 1 tablet (81 mg total) by mouth daily. Swallow whole.   atorvastatin 40 MG tablet Commonly known as: LIPITOR Take 40 mg by mouth every morning.   baclofen 10 MG tablet Commonly known as: LIORESAL Take 10 mg by mouth 3 (three) times daily.   clopidogrel 75 MG tablet Commonly known as: PLAVIX Take 75 mg by mouth daily.   escitalopram 10 MG tablet Commonly known as: LEXAPRO Take 10 mg by mouth every morning.   folic acid 1 MG tablet Commonly known as: FOLVITE Take 1 tablet (1 mg total) by mouth daily.   furosemide 20 MG tablet Commonly known as: LASIX Take 20 mg by mouth daily.   HYDROcodone-acetaminophen 5-325 MG tablet Commonly known as: NORCO/VICODIN Take 1 tablet by mouth every 8 (eight) hours as needed for moderate pain.   mirabegron ER 50 MG Tb24 tablet Commonly known as: MYRBETRIQ Take 1 tablet (50 mg total) by mouth daily.   ondansetron 4 MG  disintegrating tablet Commonly known as: ZOFRAN-ODT Take 1 tablet (4 mg total) by mouth every 8 (eight) hours as needed.   QUEtiapine 300 MG tablet Commonly known as: SEROQUEL Take 300 mg by mouth at bedtime.   risperiDONE 0.5 MG tablet Commonly known as: RISPERDAL Take 0.5 mg by mouth every morning.   tamsulosin 0.4 MG Caps capsule Commonly known as: FLOMAX Take 1 capsule (0.4 mg total) by mouth daily.   traMADol 50 MG tablet Commonly known as: ULTRAM Take 1 tablet (50 mg total) by mouth every 12 (twelve) hours as needed. Started by: Debroah Loop, PA-C   traZODone 50 MG tablet Commonly known as: DESYREL Take 50 mg by mouth at bedtime.   trospium 20 MG tablet Commonly known as: SANCTURA Take 1 tablet (20 mg total) by mouth 2 (two) times daily as needed (Frequency, urgency, bladder spasm).   Uribel 118 MG Caps Take 1 capsule (118 mg total) by mouth 4 (four) times daily as needed.        Allergies:  No Known Allergies  Family History: Family History  Problem Relation Age of Onset   Stroke Mother    Diabetes Mother    Lung cancer Father    Diabetes Father     Social History:   reports that he has been smoking cigarettes. He has a 26.50 pack-year smoking history. He has never used smokeless tobacco. He reports that he does not currently use drugs after having used the following drugs: Heroin, Cocaine, and Marijuana. He reports that he does not drink alcohol.  Physical Exam: BP (!) 145/79   Pulse 90   Temp 98.1 F (36.7 C)   Ht '5\' 3"'$  (1.6 m)   Wt 147 lb (66.7 kg)   BMI 26.04 kg/m   Constitutional:  Alert and oriented, no acute distress, nontoxic appearing HEENT: [place], AT Cardiovascular: No clubbing, cyanosis, or edema Respiratory: Normal respiratory effort, no increased work of breathing GU: No CVA tenderness MSK: Right lumbar tenderness Skin: No rashes, bruises or suspicious lesions Neurologic: Grossly intact, no focal deficits, moving all 4  extremities Psychiatric: Normal mood and affect  Laboratory Data: Results for orders placed or performed in visit on 09/17/22  Microscopic Examination   Urine  Result Value Ref Range   WBC, UA >30 (A) 0 - 5 /hpf   RBC, Urine 11-30 (A) 0 - 2 /hpf   Epithelial Cells (non renal) 0-10 0 - 10 /hpf   Bacteria, UA Moderate (A) None seen/Few  Urinalysis, Complete  Result  Value Ref Range   Specific Gravity, UA 1.010 1.005 - 1.030   pH, UA 7.0 5.0 - 7.5   Color, UA Yellow Yellow   Appearance Ur Clear Clear   Leukocytes,UA 1+ (A) Negative   Protein,UA 1+ (A) Negative/Trace   Glucose, UA Negative Negative   Ketones, UA Negative Negative   RBC, UA 1+ (A) Negative   Bilirubin, UA Negative Negative   Urobilinogen, Ur 0.2 0.2 - 1.0 mg/dL   Nitrite, UA Negative Negative   Microscopic Examination See below:    Assessment & Plan:   1. Urothelial carcinoma of kidney, right (HCC) UA today appears rather stable compared to prior, so my suspicion is low for infection at this time.  Regardless, will send for standard and atypical cultures today and treat as indicated.  I sent them home with a list of the medications he has been prescribed by this office so that they can see which, if any, of these he is currently taking.  Will refill prescriptions for Flomax, Uribel, and or trospium as needed per his reports. - Urinalysis, Complete - CULTURE, URINE COMPREHENSIVE - Mycoplasma / ureaplasma culture  2. Other chronic pain I do not think his upper tract urothelial carcinoma is the primary source of his pain given the absence of hydronephrosis on imaging or CVA tenderness on exam as well as the absence of evident metastasis on imaging.  Additionally, his reports appear more consistent with his chronic right low back pain that radiates to the RLQ.  He is extremely persistent that he needs a pain medication prescription today even though he admits tramadol has not helped his pain.  I am rather concerned about  drug-seeking behavior in this patient.  I explained that it is inappropriate for me to manage chronic back pain and that his primary care provider should be responsible for this.  As he is establishing care with a new PCP next week, I offered him a one-time prescription of tramadol to last him until his new patient appointment with Dr. Lavera Guise next week given his known malignancy.  Absolutely no refills will be provided.  They expressed understanding. - traMADol (ULTRAM) 50 MG tablet; Take 1 tablet (50 mg total) by mouth every 12 (twelve) hours as needed.  Dispense: 16 tablet; Refill: 0  Return if symptoms worsen or fail to improve.  Debroah Loop, PA-C  [facility] [address] [place], [place] [address] [phone]

## 2022-09-20 LAB — CULTURE, URINE COMPREHENSIVE

## 2022-09-23 ENCOUNTER — Other Ambulatory Visit: Payer: Self-pay | Admitting: Physician Assistant

## 2022-09-23 DIAGNOSIS — C641 Malignant neoplasm of right kidney, except renal pelvis: Secondary | ICD-10-CM

## 2022-09-23 MED ORDER — TROSPIUM CHLORIDE 20 MG PO TABS: 20.0000 mg | ORAL_TABLET | Freq: Two times a day (BID) | ORAL | 2 refills | Status: DC | PRN

## 2022-09-23 MED ORDER — TAMSULOSIN HCL 0.4 MG PO CAPS: 0.4000 mg | ORAL_CAPSULE | Freq: Every day | ORAL | 11 refills | Status: DC

## 2022-09-23 MED ORDER — URIBEL 118 MG PO CAPS: 1.0000 | ORAL_CAPSULE | Freq: Four times a day (QID) | ORAL | 11 refills | Status: DC | PRN

## 2022-09-24 LAB — MYCOPLASMA / UREAPLASMA CULTURE
Mycoplasma hominis Culture: NEGATIVE
Ureaplasma urealyticum: NEGATIVE

## 2022-09-25 ENCOUNTER — Ambulatory Visit (INDEPENDENT_AMBULATORY_CARE_PROVIDER_SITE_OTHER): Payer: Medicare (Managed Care) | Admitting: Internal Medicine

## 2022-09-25 ENCOUNTER — Encounter: Payer: Self-pay | Admitting: Internal Medicine

## 2022-09-25 DIAGNOSIS — C641 Malignant neoplasm of right kidney, except renal pelvis: Secondary | ICD-10-CM

## 2022-09-25 NOTE — Progress Notes (Signed)
Patient was upset that Dr Lavera Guise would not prescribe him pain medication so he got up during visit and left office stating he would not be back.

## 2022-09-25 NOTE — Progress Notes (Signed)
Established Patient Office Visit  Subjective:  Patient ID: [name], male    DOB: 1946/02/09  Age: 76 y.o. MRN: [id number]  CC:  Chief Complaint  Patient presents with   Establish Care    Needs refills on medication.    HPI  [name] presents for [employer]  cancer  going to [place]  Past Medical History:  Diagnosis Date   Acute right PCA stroke ([place]) 03/12/2018   a.) residual loss of peripheral vision and balance issues   Anemia    Anxiety    Bilateral carotid artery stenosis    Cerebral atherosclerosis 03/13/2018   a.) CTA neck 03/13/2018: proximal inferior division RIGHT M2 segment stenosis with normal distal opacification; mod stenosis of the distal RIGHT P2 segment (likely culprit of 03/12/2018 CVA); diffuse small vessel Dz   Cervical spondylosis    Chronic back pain    a.) followed by pain mgmt at [facility]   DDD (degenerative disc disease), lumbar    Depression    GERD (gastroesophageal reflux disease)    HCV (hepatitis C virus)    a.) s/p Tx with ledipasvir/sofosbuvir   History of kidney stones    Hypertension    Leg ulcer, left ([place]) 2023   has been seen at the wound clinic   Lesion of BILATERAL parotid glands 03/13/2018   a.) CTA neck 03/13/2018: RIGHT (1.3 x 1.8 x 2.4 cm) and LEFT (1.5 x 0.9 1.2 cm); b.) CT soft tissue neck 04/05/2021: RIGHT (2.3 x 1.6 cm) and LEFT (2.3 x 1.6 cm)   Long term current use of antithrombotics/antiplatelets    a.) daily DAPT therapy (ASA + clopidogrel)   Overdose of benzodiazepine    Overuse of medication 02/07/2022   a.) admitted to [facility] with decreased LOC secondary to overuse of "Delta 8 THC gummies"   Peripheral vascular disease ([place])    Polysubstance abuse ([place])    a.) marijuana + cocaine + heroin + BZO   Pre-diabetes    Right-sided Bell's palsy    Unresponsive episode 02/07/2022   Unsteady gait    a.) following CVA in 2019; uses ambulation aid (walker) for balance   Wears dentures    Has full upper.  Does not  wear.    Past Surgical History:  Procedure Laterality Date   ANTERIOR LUMBAR FUSION  05/13/2012   Procedure: ANTERIOR LUMBAR FUSION 1 LEVEL;  Surgeon: Melina Schools, MD;  Location: [place];  Service: Orthopedics;  Laterality: N/A;  ALIF L5-S1   CATARACT EXTRACTION W/PHACO Left 01/01/2021   Procedure: CATARACT EXTRACTION PHACO AND INTRAOCULAR LENS PLACEMENT (I[facility]) LEFT DIABETIC 2.28 00:24.7;  Surgeon: Eulogio Bear, MD;  Location: [facility];  Service: Ophthalmology;  Laterality: Left;  covid + 11-30-20   CATARACT EXTRACTION W/PHACO Right 01/15/2021   Procedure: CATARACT EXTRACTION PHACO AND INTRAOCULAR LENS PLACEMENT ([place]) RIGHT DIABETIC;  Surgeon: Eulogio Bear, MD;  Location: [facility];  Service: Ophthalmology;  Laterality: Right;  2.62 0:34.5   CHOLECYSTECTOMY     COLONOSCOPY WITH PROPOFOL N/A 09/19/2021   Procedure: COLONOSCOPY WITH PROPOFOL;  Surgeon: Toledo, Benay Pike, MD;  Location: [facility];  Service: Gastroenterology;  Laterality: N/A;   CYSTOSCOPY W/ RETROGRADES Left 08/04/2018   Procedure: CYSTOSCOPY WITH RETROGRADE PYELOGRAM;  Surgeon: Abbie Sons, MD;  Location: [facility];  Service: Urology;  Laterality: Left;   CYSTOSCOPY W/ URETERAL STENT PLACEMENT Right 07/30/2022   Procedure: CYSTOSCOPY WITH URETERAL STENT REPLACEMENT;  Surgeon: Abbie Sons, MD;  Location: [facility];  Service: Urology;  Laterality: Right;   CYSTOSCOPY WITH INSERTION OF UROLIFT N/A 01/18/2020   Procedure: CYSTOSCOPY WITH INSERTION OF UROLIFT;  Surgeon: Abbie Sons, MD;  Location: [facility];  Service: Urology;  Laterality: N/A;   CYSTOSCOPY WITH RETROGRADE PYELOGRAM, URETEROSCOPY AND STENT PLACEMENT Left 06/18/2022   Procedure: CYSTOSCOPY WITH RETROGRADE PYELOGRAM;  Surgeon: Abbie Sons, MD;  Location: [facility];  Service: Urology;  Laterality: Left;   CYSTOSCOPY WITH STENT PLACEMENT Left 08/04/2018   Procedure: CYSTOSCOPY WITH STENT PLACEMENT;  Surgeon: Abbie Sons, MD;  Location: [facility];  Service: Urology;  Laterality: Left;   CYSTOSCOPY WITH URETEROSCOPY AND STENT PLACEMENT Right 06/18/2022   Procedure: CYSTOSCOPY WITH URETEROSCOPY AND STENT PLACEMENT;  Surgeon: Abbie Sons, MD;  Location: [facility];  Service: Urology;  Laterality: Right;   EXTRACORPOREAL SHOCK WAVE LITHOTRIPSY  2012   EXTRACORPOREAL SHOCK WAVE LITHOTRIPSY Left 08/20/2018   Procedure: EXTRACORPOREAL SHOCK WAVE LITHOTRIPSY (ESWL);  Surgeon: Hollice Espy, MD;  Location: [facility];  Service: Urology;  Laterality: Left; (cancelled)   EXTRACORPOREAL SHOCK WAVE LITHOTRIPSY Left 08/27/2018   Procedure: EXTRACORPOREAL SHOCK WAVE LITHOTRIPSY (ESWL);  Surgeon: Abbie Sons, MD;  Location: [facility];  Service: Urology;  Laterality: Left;   EXTRACORPOREAL SHOCK WAVE LITHOTRIPSY Right 10/15/2018   Procedure: EXTRACORPOREAL SHOCK WAVE LITHOTRIPSY (ESWL);  Surgeon: Billey Co, MD;  Location: [facility];  Service: Urology;  Laterality: Right;   KIDNEY STONE SURGERY     URETERAL BIOPSY Right 07/30/2022   Procedure: URETERAL BIOPSY OF RENAL PELVIS;  Surgeon: Abbie Sons, MD;  Location: [facility];  Service: Urology;  Laterality: Right;   URETEROSCOPY Right 07/30/2022   Procedure: URETEROSCOPY;  Surgeon: Abbie Sons, MD;  Location: [facility];  Service: Urology;  Laterality: Right;    Family History  Problem Relation Age of Onset   Stroke Mother    Diabetes Mother    Lung cancer Father    Diabetes Father     Social History   Socioeconomic History   Marital status: Divorced    Spouse name: Not on file   Number of children: 2   Years of education: Not on file   Highest education level: Associate degree: occupational, Hotel manager, or vocational program  Occupational History   Not on file  Tobacco Use   Smoking status: Every Day    Packs/day: 0.50    Years: 53.00    Total pack years: 26.50    Types: Cigarettes   Smokeless tobacco: Never  Vaping Use   Vaping Use:  Never used  Substance and Sexual Activity   Alcohol use: Never    Alcohol/week: 0.0 standard drinks of alcohol   Drug use: Not Currently    Types: Heroin, Cocaine, Marijuana    Comment: 1960's   Sexual activity: Yes  Other Topics Concern   Not on file  Social History Narrative   ** Merged History Encounter **       Social Determinants of Health   Financial Resource Strain: Low Risk  (02/16/2019)   Overall Financial Resource Strain (CARDIA)    Difficulty of Paying Living Expenses: Not hard at all  Food Insecurity: No Food Insecurity (02/16/2019)   Hunger Vital Sign    Worried About Running Out of Food in the Last Year: Never true    Ran Out of Food in the Last Year: Never true  Transportation Needs: Unmet Transportation Needs (02/16/2019)   PRAPARE - Transportation    Lack  of Transportation (Medical): Yes    Lack of Transportation (Non-Medical): Yes  Physical Activity: Inactive (02/16/2019)   Exercise Vital Sign    Days of Exercise per Week: 0 days    Minutes of Exercise per Session: 0 min  Stress: No Stress Concern Present (02/16/2019)   [place]    Feeling of Stress : Not at all  Social Connections: Unknown (02/16/2019)   Social Connection and Isolation Panel [NHANES]    Frequency of Communication with Friends and Family: Not asked    Frequency of Social Gatherings with Friends and Family: Not asked    Attends Religious Services: Never    Marine scientist or Organizations: No    Attends Archivist Meetings: Never    Marital Status: Divorced  Human resources officer Violence: Not At Risk (02/16/2019)   Humiliation, Afraid, Rape, and Kick questionnaire    Fear of Current or Ex-Partner: No    Emotionally Abused: No    Physically Abused: No    Sexually Abused: No     Current Outpatient Medications:    amLODipine (NORVASC) 2.5 MG tablet, Take 2.5 mg by mouth every morning., Disp: , Rfl:    aspirin  EC 81 MG EC tablet, Take 1 tablet (81 mg total) by mouth daily. Swallow whole., Disp: 30 tablet, Rfl: 0   atorvastatin (LIPITOR) 40 MG tablet, Take 40 mg by mouth every morning., Disp: , Rfl:    baclofen (LIORESAL) 10 MG tablet, Take 10 mg by mouth 3 (three) times daily., Disp: , Rfl:    clopidogrel (PLAVIX) 75 MG tablet, Take 75 mg by mouth daily., Disp: , Rfl:    escitalopram (LEXAPRO) 10 MG tablet, Take 10 mg by mouth every morning., Disp: , Rfl:    folic acid (FOLVITE) 1 MG tablet, Take 1 tablet (1 mg total) by mouth daily., Disp: 30 tablet, Rfl: 0   furosemide (LASIX) 20 MG tablet, Take 20 mg by mouth daily., Disp: , Rfl:    HYDROcodone-acetaminophen (NORCO/VICODIN) 5-325 MG tablet, Take 1 tablet by mouth every 8 (eight) hours as needed for moderate pain., Disp: 15 tablet, Rfl: 0   Meth-Hyo-M Bl-Na Phos-Ph Sal (URIBEL) 118 MG CAPS, Take 1 capsule (118 mg total) by mouth 4 (four) times daily as needed (burning pain with urination)., Disp: 30 capsule, Rfl: 11   mirabegron ER (MYRBETRIQ) 50 MG TB24 tablet, Take 1 tablet (50 mg total) by mouth daily., Disp: 28 tablet, Rfl: 0   ondansetron (ZOFRAN-ODT) 4 MG disintegrating tablet, Take 1 tablet (4 mg total) by mouth every 8 (eight) hours as needed., Disp: 20 tablet, Rfl: 0   QUEtiapine (SEROQUEL) 300 MG tablet, Take 300 mg by mouth at bedtime., Disp: , Rfl:    risperiDONE (RISPERDAL) 0.5 MG tablet, Take 0.5 mg by mouth every morning., Disp: , Rfl:    tamsulosin (FLOMAX) 0.4 MG CAPS capsule, Take 1 capsule (0.4 mg total) by mouth daily., Disp: 30 capsule, Rfl: 11   traMADol (ULTRAM) 50 MG tablet, Take 1 tablet (50 mg total) by mouth every 12 (twelve) hours as needed., Disp: 16 tablet, Rfl: 0   traZODone (DESYREL) 50 MG tablet, Take 50 mg by mouth at bedtime., Disp: , Rfl:    trospium (SANCTURA) 20 MG tablet, Take 1 tablet (20 mg total) by mouth 2 (two) times daily as needed (Frequency, urgency, bladder spasm)., Disp: 60 tablet, Rfl: 2   LORazepam  (ATIVAN) 0.5 MG tablet, Take 0.5 mg by mouth  daily as needed., Disp: , Rfl:    No Known Allergies  ROS Review of Systems  Constitutional: Negative.   HENT: Negative.    Eyes: Negative.   Respiratory: Negative.    Cardiovascular: Negative.   Gastrointestinal: Negative.   Endocrine: Negative.   Genitourinary: Negative.   Musculoskeletal: Negative.   Skin: Negative.   Allergic/Immunologic: Negative.   Neurological: Negative.   Hematological: Negative.   Psychiatric/Behavioral: Negative.    All other systems reviewed and are negative.     Objective:    Physical Exam Vitals reviewed.  Constitutional:      Appearance: Normal appearance.  HENT:     Mouth/Throat:     Mouth: Mucous membranes are moist.  Eyes:     Pupils: Pupils are equal, round, and reactive to light.  Neck:     Vascular: No carotid bruit.  Cardiovascular:     Rate and Rhythm: Normal rate and regular rhythm.     Pulses: Normal pulses.     Heart sounds: Normal heart sounds.  Pulmonary:     Effort: Pulmonary effort is normal.     Breath sounds: Normal breath sounds.  Abdominal:     General: Bowel sounds are normal.     Palpations: Abdomen is soft. There is no hepatomegaly, splenomegaly or mass.     Tenderness: There is no abdominal tenderness.     Hernia: No hernia is present.  Musculoskeletal:     Cervical back: Neck supple.     Right lower leg: No edema.     Left lower leg: No edema.  Skin:    Findings: No rash.  Neurological:     Mental Status: He is alert and oriented to person, place, and time.     Motor: No weakness.  Psychiatric:        Mood and Affect: Mood normal.        Behavior: Behavior normal.     BP 126/81   Pulse (!) 101   Temp 97.6 F (36.4 C) (Temporal)   Ht '5\' 2"'$  (1.575 m)   Wt 145 lb 3.2 oz (65.9 kg)   SpO2 98%   BMI 26.56 kg/m  Wt Readings from Last 3 Encounters:  09/25/22 145 lb 3.2 oz (65.9 kg)  09/17/22 147 lb (66.7 kg)  08/31/22 147 lb (66.7 kg)     Health  Maintenance Due  Topic Date Due   FOOT EXAM  Never done   OPHTHALMOLOGY EXAM  Never done   Medicare Annual Wellness (AWV)  01/22/2022   Diabetic kidney evaluation - Urine ACR  05/21/2022   INFLUENZA VACCINE  05/21/2022   HEMOGLOBIN A1C  08/10/2022    There are no preventive care reminders to display for this patient.  Lab Results  Component Value Date   TSH 4.589 (H) 10/14/2020   Lab Results  Component Value Date   WBC 11.0 (H) 08/31/2022   HGB 15.2 08/31/2022   HCT 45.9 08/31/2022   MCV 89.5 08/31/2022   PLT 231 08/31/2022   Lab Results  Component Value Date   NA 137 08/31/2022   K 3.3 (L) 08/31/2022   CO2 32 08/31/2022   GLUCOSE 155 (H) 08/31/2022   BUN 25 (H) 08/31/2022   CREATININE 1.14 08/31/2022   BILITOT 0.6 08/31/2022   ALKPHOS 85 08/31/2022   AST 22 08/31/2022   ALT 21 08/31/2022   PROT 8.6 (H) 08/31/2022   ALBUMIN 4.4 08/31/2022   CALCIUM 10.3 08/31/2022   ANIONGAP 12 08/31/2022   Lab  Results  Component Value Date   CHOL 200 03/13/2018   Lab Results  Component Value Date   HDL 20 (L) 03/13/2018   Lab Results  Component Value Date   LDLCALC 131 (H) 03/13/2018   Lab Results  Component Value Date   TRIG 247 (H) 03/13/2018   Lab Results  Component Value Date   CHOLHDL 10.0 03/13/2018   Lab Results  Component Value Date   HGBA1C 6.7 (H) 02/08/2022      Assessment & Plan:   Problem List Items Addressed This Visit   None Visit Diagnoses     Urothelial carcinoma of kidney, right (HCC)       Relevant Medications   LORazepam (ATIVAN) 0.5 MG tablet     Pt wanted  pain med , so he got upset   and left, before full visit , recently left another practice  No orders of the defined types were placed in this encounter.   Follow-up: No follow-ups on file.    Cletis Athens, MD

## 2022-10-03 ENCOUNTER — Ambulatory Visit: Payer: Medicare (Managed Care) | Admitting: Nurse Practitioner

## 2022-10-22 DIAGNOSIS — Z0100 Encounter for examination of eyes and vision without abnormal findings: Secondary | ICD-10-CM | POA: Diagnosis not present

## 2022-10-24 DIAGNOSIS — H524 Presbyopia: Secondary | ICD-10-CM | POA: Diagnosis not present

## 2022-10-30 DIAGNOSIS — I10 Essential (primary) hypertension: Secondary | ICD-10-CM | POA: Diagnosis not present

## 2022-10-30 DIAGNOSIS — I739 Peripheral vascular disease, unspecified: Secondary | ICD-10-CM | POA: Diagnosis not present

## 2022-10-30 DIAGNOSIS — R000 Tachycardia, unspecified: Secondary | ICD-10-CM | POA: Diagnosis not present

## 2022-10-30 DIAGNOSIS — C641 Malignant neoplasm of right kidney, except renal pelvis: Secondary | ICD-10-CM | POA: Diagnosis not present

## 2022-11-06 DIAGNOSIS — Z9049 Acquired absence of other specified parts of digestive tract: Secondary | ICD-10-CM | POA: Diagnosis not present

## 2022-11-06 DIAGNOSIS — I2510 Atherosclerotic heart disease of native coronary artery without angina pectoris: Secondary | ICD-10-CM | POA: Diagnosis not present

## 2022-11-06 DIAGNOSIS — N62 Hypertrophy of breast: Secondary | ICD-10-CM | POA: Diagnosis not present

## 2022-11-06 DIAGNOSIS — I10 Essential (primary) hypertension: Secondary | ICD-10-CM | POA: Diagnosis not present

## 2022-11-06 DIAGNOSIS — C641 Malignant neoplasm of right kidney, except renal pelvis: Secondary | ICD-10-CM | POA: Diagnosis not present

## 2022-11-06 DIAGNOSIS — N289 Disorder of kidney and ureter, unspecified: Secondary | ICD-10-CM | POA: Diagnosis not present

## 2022-11-06 DIAGNOSIS — I2584 Coronary atherosclerosis due to calcified coronary lesion: Secondary | ICD-10-CM | POA: Diagnosis not present

## 2022-11-06 DIAGNOSIS — Z01810 Encounter for preprocedural cardiovascular examination: Secondary | ICD-10-CM | POA: Diagnosis not present

## 2022-11-06 DIAGNOSIS — I700 Atherosclerosis of aorta: Secondary | ICD-10-CM | POA: Diagnosis not present

## 2022-11-06 DIAGNOSIS — I739 Peripheral vascular disease, unspecified: Secondary | ICD-10-CM | POA: Diagnosis not present

## 2022-11-06 DIAGNOSIS — Z01818 Encounter for other preprocedural examination: Secondary | ICD-10-CM | POA: Diagnosis not present

## 2022-11-08 DIAGNOSIS — Z79899 Other long term (current) drug therapy: Secondary | ICD-10-CM | POA: Diagnosis not present

## 2022-11-11 DIAGNOSIS — N2889 Other specified disorders of kidney and ureter: Secondary | ICD-10-CM | POA: Diagnosis not present

## 2022-11-11 DIAGNOSIS — C649 Malignant neoplasm of unspecified kidney, except renal pelvis: Secondary | ICD-10-CM | POA: Diagnosis not present

## 2022-11-21 DIAGNOSIS — C689 Malignant neoplasm of urinary organ, unspecified: Secondary | ICD-10-CM | POA: Diagnosis not present

## 2022-11-21 DIAGNOSIS — F17210 Nicotine dependence, cigarettes, uncomplicated: Secondary | ICD-10-CM | POA: Diagnosis not present

## 2022-11-21 DIAGNOSIS — C641 Malignant neoplasm of right kidney, except renal pelvis: Secondary | ICD-10-CM | POA: Diagnosis not present

## 2022-11-21 DIAGNOSIS — I10 Essential (primary) hypertension: Secondary | ICD-10-CM | POA: Diagnosis not present

## 2022-11-21 DIAGNOSIS — E785 Hyperlipidemia, unspecified: Secondary | ICD-10-CM | POA: Diagnosis not present

## 2022-11-21 DIAGNOSIS — Z716 Tobacco abuse counseling: Secondary | ICD-10-CM | POA: Diagnosis not present

## 2022-11-21 HISTORY — PX: NEPHRECTOMY, RADICAL, ROBOT ASSISTED, LAPAROSCOPIC: SHX7385

## 2022-11-25 DIAGNOSIS — S0990XA Unspecified injury of head, initial encounter: Secondary | ICD-10-CM | POA: Diagnosis not present

## 2022-11-25 DIAGNOSIS — Z20822 Contact with and (suspected) exposure to covid-19: Secondary | ICD-10-CM | POA: Diagnosis not present

## 2022-11-25 DIAGNOSIS — Z905 Acquired absence of kidney: Secondary | ICD-10-CM | POA: Diagnosis not present

## 2022-11-25 DIAGNOSIS — W19XXXA Unspecified fall, initial encounter: Secondary | ICD-10-CM | POA: Diagnosis not present

## 2022-11-25 DIAGNOSIS — R509 Fever, unspecified: Secondary | ICD-10-CM | POA: Diagnosis not present

## 2022-11-25 DIAGNOSIS — S199XXA Unspecified injury of neck, initial encounter: Secondary | ICD-10-CM | POA: Diagnosis not present

## 2022-11-25 DIAGNOSIS — J90 Pleural effusion, not elsewhere classified: Secondary | ICD-10-CM | POA: Diagnosis not present

## 2022-11-25 DIAGNOSIS — R5082 Postprocedural fever: Secondary | ICD-10-CM | POA: Diagnosis not present

## 2022-11-26 DIAGNOSIS — N368 Other specified disorders of urethra: Secondary | ICD-10-CM | POA: Diagnosis not present

## 2022-11-26 DIAGNOSIS — I639 Cerebral infarction, unspecified: Secondary | ICD-10-CM | POA: Diagnosis not present

## 2022-11-26 DIAGNOSIS — R4182 Altered mental status, unspecified: Secondary | ICD-10-CM | POA: Diagnosis not present

## 2022-11-26 DIAGNOSIS — G928 Other toxic encephalopathy: Secondary | ICD-10-CM | POA: Diagnosis not present

## 2022-11-26 DIAGNOSIS — E785 Hyperlipidemia, unspecified: Secondary | ICD-10-CM | POA: Diagnosis not present

## 2022-11-26 DIAGNOSIS — C680 Malignant neoplasm of urethra: Secondary | ICD-10-CM | POA: Diagnosis not present

## 2022-11-26 DIAGNOSIS — R5381 Other malaise: Secondary | ICD-10-CM | POA: Diagnosis not present

## 2022-11-26 DIAGNOSIS — Z538 Procedure and treatment not carried out for other reasons: Secondary | ICD-10-CM | POA: Diagnosis not present

## 2022-11-26 DIAGNOSIS — Z905 Acquired absence of kidney: Secondary | ICD-10-CM | POA: Diagnosis not present

## 2022-11-26 DIAGNOSIS — R000 Tachycardia, unspecified: Secondary | ICD-10-CM | POA: Diagnosis not present

## 2022-11-26 DIAGNOSIS — R279 Unspecified lack of coordination: Secondary | ICD-10-CM | POA: Diagnosis not present

## 2022-11-26 DIAGNOSIS — K219 Gastro-esophageal reflux disease without esophagitis: Secondary | ICD-10-CM | POA: Diagnosis not present

## 2022-11-26 DIAGNOSIS — R5082 Postprocedural fever: Secondary | ICD-10-CM | POA: Diagnosis not present

## 2022-11-26 DIAGNOSIS — R41841 Cognitive communication deficit: Secondary | ICD-10-CM | POA: Diagnosis not present

## 2022-11-26 DIAGNOSIS — M6281 Muscle weakness (generalized): Secondary | ICD-10-CM | POA: Diagnosis not present

## 2022-11-26 DIAGNOSIS — F17210 Nicotine dependence, cigarettes, uncomplicated: Secondary | ICD-10-CM | POA: Diagnosis not present

## 2022-11-26 DIAGNOSIS — M1990 Unspecified osteoarthritis, unspecified site: Secondary | ICD-10-CM | POA: Diagnosis not present

## 2022-11-26 DIAGNOSIS — K429 Umbilical hernia without obstruction or gangrene: Secondary | ICD-10-CM | POA: Diagnosis not present

## 2022-11-26 DIAGNOSIS — W19XXXA Unspecified fall, initial encounter: Secondary | ICD-10-CM | POA: Diagnosis not present

## 2022-11-26 DIAGNOSIS — Z20822 Contact with and (suspected) exposure to covid-19: Secondary | ICD-10-CM | POA: Diagnosis not present

## 2022-11-26 DIAGNOSIS — Z8673 Personal history of transient ischemic attack (TIA), and cerebral infarction without residual deficits: Secondary | ICD-10-CM | POA: Diagnosis not present

## 2022-11-26 DIAGNOSIS — C651 Malignant neoplasm of right renal pelvis: Secondary | ICD-10-CM | POA: Diagnosis not present

## 2022-11-26 DIAGNOSIS — F32A Depression, unspecified: Secondary | ICD-10-CM | POA: Diagnosis not present

## 2022-11-26 DIAGNOSIS — I10 Essential (primary) hypertension: Secondary | ICD-10-CM | POA: Diagnosis not present

## 2022-11-26 DIAGNOSIS — R509 Fever, unspecified: Secondary | ICD-10-CM | POA: Diagnosis not present

## 2022-11-26 DIAGNOSIS — R9341 Abnormal radiologic findings on diagnostic imaging of renal pelvis, ureter, or bladder: Secondary | ICD-10-CM | POA: Diagnosis not present

## 2022-11-26 DIAGNOSIS — Z9181 History of falling: Secondary | ICD-10-CM | POA: Diagnosis not present

## 2022-11-26 DIAGNOSIS — S0990XA Unspecified injury of head, initial encounter: Secondary | ICD-10-CM | POA: Diagnosis not present

## 2022-11-26 DIAGNOSIS — N179 Acute kidney failure, unspecified: Secondary | ICD-10-CM | POA: Diagnosis not present

## 2022-11-26 DIAGNOSIS — Z79899 Other long term (current) drug therapy: Secondary | ICD-10-CM | POA: Diagnosis not present

## 2022-11-26 DIAGNOSIS — Z7902 Long term (current) use of antithrombotics/antiplatelets: Secondary | ICD-10-CM | POA: Diagnosis not present

## 2022-11-26 DIAGNOSIS — Y92009 Unspecified place in unspecified non-institutional (private) residence as the place of occurrence of the external cause: Secondary | ICD-10-CM | POA: Diagnosis not present

## 2022-12-05 DIAGNOSIS — R5381 Other malaise: Secondary | ICD-10-CM | POA: Diagnosis not present

## 2022-12-05 DIAGNOSIS — F331 Major depressive disorder, recurrent, moderate: Secondary | ICD-10-CM | POA: Diagnosis not present

## 2022-12-05 DIAGNOSIS — G8929 Other chronic pain: Secondary | ICD-10-CM | POA: Diagnosis not present

## 2022-12-05 DIAGNOSIS — C680 Malignant neoplasm of urethra: Secondary | ICD-10-CM | POA: Diagnosis not present

## 2022-12-05 DIAGNOSIS — R279 Unspecified lack of coordination: Secondary | ICD-10-CM | POA: Diagnosis not present

## 2022-12-05 DIAGNOSIS — I10 Essential (primary) hypertension: Secondary | ICD-10-CM | POA: Diagnosis not present

## 2022-12-05 DIAGNOSIS — Z978 Presence of other specified devices: Secondary | ICD-10-CM | POA: Diagnosis not present

## 2022-12-05 DIAGNOSIS — R32 Unspecified urinary incontinence: Secondary | ICD-10-CM | POA: Diagnosis not present

## 2022-12-05 DIAGNOSIS — I639 Cerebral infarction, unspecified: Secondary | ICD-10-CM | POA: Diagnosis not present

## 2022-12-05 DIAGNOSIS — C641 Malignant neoplasm of right kidney, except renal pelvis: Secondary | ICD-10-CM | POA: Diagnosis not present

## 2022-12-05 DIAGNOSIS — E785 Hyperlipidemia, unspecified: Secondary | ICD-10-CM | POA: Diagnosis not present

## 2022-12-05 DIAGNOSIS — K219 Gastro-esophageal reflux disease without esophagitis: Secondary | ICD-10-CM | POA: Diagnosis not present

## 2022-12-05 DIAGNOSIS — M6281 Muscle weakness (generalized): Secondary | ICD-10-CM | POA: Diagnosis not present

## 2022-12-05 DIAGNOSIS — F32A Depression, unspecified: Secondary | ICD-10-CM | POA: Diagnosis not present

## 2022-12-05 DIAGNOSIS — M62838 Other muscle spasm: Secondary | ICD-10-CM | POA: Diagnosis not present

## 2022-12-05 DIAGNOSIS — Z7189 Other specified counseling: Secondary | ICD-10-CM | POA: Diagnosis not present

## 2022-12-05 DIAGNOSIS — R41841 Cognitive communication deficit: Secondary | ICD-10-CM | POA: Diagnosis not present

## 2022-12-05 DIAGNOSIS — K5900 Constipation, unspecified: Secondary | ICD-10-CM | POA: Diagnosis not present

## 2022-12-05 DIAGNOSIS — R296 Repeated falls: Secondary | ICD-10-CM | POA: Diagnosis not present

## 2022-12-06 DIAGNOSIS — M62838 Other muscle spasm: Secondary | ICD-10-CM | POA: Diagnosis not present

## 2022-12-06 DIAGNOSIS — R296 Repeated falls: Secondary | ICD-10-CM | POA: Diagnosis not present

## 2022-12-06 DIAGNOSIS — G8929 Other chronic pain: Secondary | ICD-10-CM | POA: Diagnosis not present

## 2022-12-06 DIAGNOSIS — Z7189 Other specified counseling: Secondary | ICD-10-CM | POA: Diagnosis not present

## 2022-12-06 DIAGNOSIS — Z978 Presence of other specified devices: Secondary | ICD-10-CM | POA: Diagnosis not present

## 2022-12-06 DIAGNOSIS — K5900 Constipation, unspecified: Secondary | ICD-10-CM | POA: Diagnosis not present

## 2022-12-06 DIAGNOSIS — M6281 Muscle weakness (generalized): Secondary | ICD-10-CM | POA: Diagnosis not present

## 2022-12-06 DIAGNOSIS — R32 Unspecified urinary incontinence: Secondary | ICD-10-CM | POA: Diagnosis not present

## 2022-12-12 DIAGNOSIS — C641 Malignant neoplasm of right kidney, except renal pelvis: Secondary | ICD-10-CM | POA: Diagnosis not present

## 2022-12-12 DIAGNOSIS — R32 Unspecified urinary incontinence: Secondary | ICD-10-CM | POA: Diagnosis not present

## 2022-12-23 DIAGNOSIS — C641 Malignant neoplasm of right kidney, except renal pelvis: Secondary | ICD-10-CM | POA: Diagnosis not present

## 2022-12-24 DIAGNOSIS — R32 Unspecified urinary incontinence: Secondary | ICD-10-CM | POA: Diagnosis not present

## 2022-12-30 DIAGNOSIS — Y846 Urinary catheterization as the cause of abnormal reaction of the patient, or of later complication, without mention of misadventure at the time of the procedure: Secondary | ICD-10-CM | POA: Diagnosis not present

## 2022-12-30 DIAGNOSIS — T83031A Leakage of indwelling urethral catheter, initial encounter: Secondary | ICD-10-CM | POA: Diagnosis not present

## 2022-12-30 DIAGNOSIS — N3001 Acute cystitis with hematuria: Secondary | ICD-10-CM | POA: Diagnosis not present

## 2022-12-30 DIAGNOSIS — Z7902 Long term (current) use of antithrombotics/antiplatelets: Secondary | ICD-10-CM | POA: Diagnosis not present

## 2022-12-30 DIAGNOSIS — I10 Essential (primary) hypertension: Secondary | ICD-10-CM | POA: Diagnosis not present

## 2022-12-30 DIAGNOSIS — D72829 Elevated white blood cell count, unspecified: Secondary | ICD-10-CM | POA: Diagnosis not present

## 2022-12-30 DIAGNOSIS — E785 Hyperlipidemia, unspecified: Secondary | ICD-10-CM | POA: Diagnosis not present

## 2022-12-30 DIAGNOSIS — F17210 Nicotine dependence, cigarettes, uncomplicated: Secondary | ICD-10-CM | POA: Diagnosis not present

## 2022-12-30 DIAGNOSIS — Z905 Acquired absence of kidney: Secondary | ICD-10-CM | POA: Diagnosis not present

## 2023-01-14 ENCOUNTER — Ambulatory Visit: Payer: Medicare (Managed Care) | Admitting: Family Medicine

## 2023-01-18 DIAGNOSIS — R32 Unspecified urinary incontinence: Secondary | ICD-10-CM | POA: Diagnosis not present

## 2023-01-18 DIAGNOSIS — N3289 Other specified disorders of bladder: Secondary | ICD-10-CM | POA: Diagnosis not present

## 2023-01-18 DIAGNOSIS — K429 Umbilical hernia without obstruction or gangrene: Secondary | ICD-10-CM | POA: Diagnosis not present

## 2023-01-18 DIAGNOSIS — C651 Malignant neoplasm of right renal pelvis: Secondary | ICD-10-CM | POA: Diagnosis not present

## 2023-01-23 DIAGNOSIS — R339 Retention of urine, unspecified: Secondary | ICD-10-CM | POA: Diagnosis not present

## 2023-02-24 ENCOUNTER — Ambulatory Visit: Payer: 59 | Admitting: Podiatry

## 2023-02-25 ENCOUNTER — Ambulatory Visit: Payer: Medicare HMO | Admitting: Podiatry

## 2023-02-25 ENCOUNTER — Encounter: Payer: Self-pay | Admitting: Podiatry

## 2023-02-25 DIAGNOSIS — B351 Tinea unguium: Secondary | ICD-10-CM | POA: Diagnosis not present

## 2023-02-25 DIAGNOSIS — M79675 Pain in left toe(s): Secondary | ICD-10-CM

## 2023-02-25 DIAGNOSIS — M79674 Pain in right toe(s): Secondary | ICD-10-CM

## 2023-02-25 NOTE — Progress Notes (Signed)
  Subjective:  Patient ID: [name], male    DOB: 1946-06-30,  MRN: [id number]  Chief Complaint  Patient presents with   Diabetes    Nail trim    77 y.o. male returns for the above complaint.  Patient presents with thickened elongated dystrophic mycotic toenails x 10 mild pain on palpation worse with ambulation worse with pressure he would like me to debride down he is not able to do it himself.  Objective:  There were no vitals filed for this visit. Podiatric Exam: Vascular: dorsalis pedis and posterior tibial pulses are palpable bilateral. Capillary return is immediate. Temperature gradient is WNL. Skin turgor WNL  Sensorium: Normal Semmes Weinstein monofilament test. Normal tactile sensation bilaterally. Nail Exam: Pt has thick disfigured discolored nails with subungual debris noted bilateral entire nail hallux through fifth toenails.  Pain on palpation to the nails. Ulcer Exam: There is no evidence of ulcer or pre-ulcerative changes or infection. Orthopedic Exam: Muscle tone and strength are WNL. No limitations in general ROM. No crepitus or effusions noted.  Skin: No Porokeratosis. No infection or ulcers    Assessment & Plan:   1. Pain due to onychomycosis of toenails of both feet     Patient was evaluated and treated and all questions answered.  Onychomycosis with pain  -Nails palliatively debrided as below. -Educated on self-care  Procedure: Nail Debridement Rationale: pain  Type of Debridement: manual, sharp debridement. Instrumentation: Nail nipper, rotary burr. Number of Nails: 10  Procedures and Treatment: Consent by patient was obtained for treatment procedures. The patient understood the discussion of treatment and procedures well. All questions were answered thoroughly reviewed. Debridement of mycotic and hypertrophic toenails, 1 through 5 bilateral and clearing of subungual debris. No ulceration, no infection noted.  Return Visit-Office Procedure: Patient  instructed to return to the office for a follow up visit 3 months for continued evaluation and treatment.  Nicholes Rough, DPM    Return in about 3 months (around 05/28/2023).

## 2023-03-27 DIAGNOSIS — H6692 Otitis media, unspecified, left ear: Secondary | ICD-10-CM | POA: Diagnosis not present

## 2023-04-07 DIAGNOSIS — E119 Type 2 diabetes mellitus without complications: Secondary | ICD-10-CM | POA: Diagnosis not present

## 2023-04-07 DIAGNOSIS — H6692 Otitis media, unspecified, left ear: Secondary | ICD-10-CM | POA: Diagnosis not present

## 2023-04-07 DIAGNOSIS — I10 Essential (primary) hypertension: Secondary | ICD-10-CM | POA: Diagnosis not present

## 2023-05-05 ENCOUNTER — Other Ambulatory Visit: Payer: Self-pay | Admitting: Family Medicine

## 2023-05-05 ENCOUNTER — Ambulatory Visit
Admission: RE | Admit: 2023-05-05 | Discharge: 2023-05-05 | Disposition: A | Discharge status: Home / Self Care | Payer: Medicare HMO | Source: Ambulatory Visit | Attending: Family Medicine | Admitting: Family Medicine

## 2023-05-05 DIAGNOSIS — G8929 Other chronic pain: Secondary | ICD-10-CM

## 2023-05-05 DIAGNOSIS — M47816 Spondylosis without myelopathy or radiculopathy, lumbar region: Secondary | ICD-10-CM | POA: Diagnosis not present

## 2023-05-05 DIAGNOSIS — Z981 Arthrodesis status: Secondary | ICD-10-CM | POA: Diagnosis not present

## 2023-05-16 DIAGNOSIS — I10 Essential (primary) hypertension: Secondary | ICD-10-CM | POA: Diagnosis not present

## 2023-05-16 DIAGNOSIS — G8929 Other chronic pain: Secondary | ICD-10-CM | POA: Diagnosis not present

## 2023-05-16 DIAGNOSIS — I700 Atherosclerosis of aorta: Secondary | ICD-10-CM | POA: Diagnosis not present

## 2023-05-16 DIAGNOSIS — M47817 Spondylosis without myelopathy or radiculopathy, lumbosacral region: Secondary | ICD-10-CM | POA: Diagnosis not present

## 2023-05-16 DIAGNOSIS — Z7901 Long term (current) use of anticoagulants: Secondary | ICD-10-CM | POA: Diagnosis not present

## 2023-05-16 DIAGNOSIS — C20 Malignant neoplasm of rectum: Secondary | ICD-10-CM | POA: Diagnosis not present

## 2023-05-16 DIAGNOSIS — Z9181 History of falling: Secondary | ICD-10-CM | POA: Diagnosis not present

## 2023-05-16 DIAGNOSIS — Z905 Acquired absence of kidney: Secondary | ICD-10-CM | POA: Diagnosis not present

## 2023-05-16 DIAGNOSIS — F319 Bipolar disorder, unspecified: Secondary | ICD-10-CM | POA: Diagnosis not present

## 2023-05-16 DIAGNOSIS — E782 Mixed hyperlipidemia: Secondary | ICD-10-CM | POA: Diagnosis not present

## 2023-05-16 DIAGNOSIS — E119 Type 2 diabetes mellitus without complications: Secondary | ICD-10-CM | POA: Diagnosis not present

## 2023-05-16 DIAGNOSIS — Z7984 Long term (current) use of oral hypoglycemic drugs: Secondary | ICD-10-CM | POA: Diagnosis not present

## 2023-05-20 DIAGNOSIS — E782 Mixed hyperlipidemia: Secondary | ICD-10-CM | POA: Diagnosis not present

## 2023-05-20 DIAGNOSIS — Z905 Acquired absence of kidney: Secondary | ICD-10-CM | POA: Diagnosis not present

## 2023-05-20 DIAGNOSIS — C20 Malignant neoplasm of rectum: Secondary | ICD-10-CM | POA: Diagnosis not present

## 2023-05-20 DIAGNOSIS — I10 Essential (primary) hypertension: Secondary | ICD-10-CM | POA: Diagnosis not present

## 2023-05-20 DIAGNOSIS — M47817 Spondylosis without myelopathy or radiculopathy, lumbosacral region: Secondary | ICD-10-CM | POA: Diagnosis not present

## 2023-05-20 DIAGNOSIS — Z9181 History of falling: Secondary | ICD-10-CM | POA: Diagnosis not present

## 2023-05-20 DIAGNOSIS — E119 Type 2 diabetes mellitus without complications: Secondary | ICD-10-CM | POA: Diagnosis not present

## 2023-05-20 DIAGNOSIS — Z7901 Long term (current) use of anticoagulants: Secondary | ICD-10-CM | POA: Diagnosis not present

## 2023-05-20 DIAGNOSIS — Z7984 Long term (current) use of oral hypoglycemic drugs: Secondary | ICD-10-CM | POA: Diagnosis not present

## 2023-05-20 DIAGNOSIS — G8929 Other chronic pain: Secondary | ICD-10-CM | POA: Diagnosis not present

## 2023-05-20 DIAGNOSIS — I700 Atherosclerosis of aorta: Secondary | ICD-10-CM | POA: Diagnosis not present

## 2023-05-20 DIAGNOSIS — F319 Bipolar disorder, unspecified: Secondary | ICD-10-CM | POA: Diagnosis not present

## 2023-05-22 DIAGNOSIS — Z905 Acquired absence of kidney: Secondary | ICD-10-CM | POA: Diagnosis not present

## 2023-05-22 DIAGNOSIS — I10 Essential (primary) hypertension: Secondary | ICD-10-CM | POA: Diagnosis not present

## 2023-05-22 DIAGNOSIS — F319 Bipolar disorder, unspecified: Secondary | ICD-10-CM | POA: Diagnosis not present

## 2023-05-22 DIAGNOSIS — Z9181 History of falling: Secondary | ICD-10-CM | POA: Diagnosis not present

## 2023-05-22 DIAGNOSIS — I700 Atherosclerosis of aorta: Secondary | ICD-10-CM | POA: Diagnosis not present

## 2023-05-22 DIAGNOSIS — M47817 Spondylosis without myelopathy or radiculopathy, lumbosacral region: Secondary | ICD-10-CM | POA: Diagnosis not present

## 2023-05-22 DIAGNOSIS — C20 Malignant neoplasm of rectum: Secondary | ICD-10-CM | POA: Diagnosis not present

## 2023-05-22 DIAGNOSIS — Z7901 Long term (current) use of anticoagulants: Secondary | ICD-10-CM | POA: Diagnosis not present

## 2023-05-22 DIAGNOSIS — G8929 Other chronic pain: Secondary | ICD-10-CM | POA: Diagnosis not present

## 2023-05-22 DIAGNOSIS — Z7984 Long term (current) use of oral hypoglycemic drugs: Secondary | ICD-10-CM | POA: Diagnosis not present

## 2023-05-22 DIAGNOSIS — E782 Mixed hyperlipidemia: Secondary | ICD-10-CM | POA: Diagnosis not present

## 2023-05-22 DIAGNOSIS — E119 Type 2 diabetes mellitus without complications: Secondary | ICD-10-CM | POA: Diagnosis not present

## 2023-05-26 DIAGNOSIS — H9202 Otalgia, left ear: Secondary | ICD-10-CM | POA: Diagnosis not present

## 2023-05-26 DIAGNOSIS — H903 Sensorineural hearing loss, bilateral: Secondary | ICD-10-CM | POA: Diagnosis not present

## 2023-05-27 DIAGNOSIS — Z7901 Long term (current) use of anticoagulants: Secondary | ICD-10-CM | POA: Diagnosis not present

## 2023-05-27 DIAGNOSIS — E119 Type 2 diabetes mellitus without complications: Secondary | ICD-10-CM | POA: Diagnosis not present

## 2023-05-27 DIAGNOSIS — Z7984 Long term (current) use of oral hypoglycemic drugs: Secondary | ICD-10-CM | POA: Diagnosis not present

## 2023-05-27 DIAGNOSIS — Z9181 History of falling: Secondary | ICD-10-CM | POA: Diagnosis not present

## 2023-05-27 DIAGNOSIS — E782 Mixed hyperlipidemia: Secondary | ICD-10-CM | POA: Diagnosis not present

## 2023-05-27 DIAGNOSIS — I10 Essential (primary) hypertension: Secondary | ICD-10-CM | POA: Diagnosis not present

## 2023-05-27 DIAGNOSIS — Z905 Acquired absence of kidney: Secondary | ICD-10-CM | POA: Diagnosis not present

## 2023-05-27 DIAGNOSIS — C20 Malignant neoplasm of rectum: Secondary | ICD-10-CM | POA: Diagnosis not present

## 2023-05-27 DIAGNOSIS — G8929 Other chronic pain: Secondary | ICD-10-CM | POA: Diagnosis not present

## 2023-05-27 DIAGNOSIS — F319 Bipolar disorder, unspecified: Secondary | ICD-10-CM | POA: Diagnosis not present

## 2023-05-27 DIAGNOSIS — M47817 Spondylosis without myelopathy or radiculopathy, lumbosacral region: Secondary | ICD-10-CM | POA: Diagnosis not present

## 2023-05-27 DIAGNOSIS — I700 Atherosclerosis of aorta: Secondary | ICD-10-CM | POA: Diagnosis not present

## 2023-05-29 ENCOUNTER — Ambulatory Visit: Payer: Medicare HMO | Admitting: Podiatry

## 2023-06-03 DIAGNOSIS — E119 Type 2 diabetes mellitus without complications: Secondary | ICD-10-CM | POA: Diagnosis not present

## 2023-06-03 DIAGNOSIS — F319 Bipolar disorder, unspecified: Secondary | ICD-10-CM | POA: Diagnosis not present

## 2023-06-03 DIAGNOSIS — I10 Essential (primary) hypertension: Secondary | ICD-10-CM | POA: Diagnosis not present

## 2023-06-03 DIAGNOSIS — M47817 Spondylosis without myelopathy or radiculopathy, lumbosacral region: Secondary | ICD-10-CM | POA: Diagnosis not present

## 2023-06-03 DIAGNOSIS — G8929 Other chronic pain: Secondary | ICD-10-CM | POA: Diagnosis not present

## 2023-06-03 DIAGNOSIS — C20 Malignant neoplasm of rectum: Secondary | ICD-10-CM | POA: Diagnosis not present

## 2023-06-03 DIAGNOSIS — Z7984 Long term (current) use of oral hypoglycemic drugs: Secondary | ICD-10-CM | POA: Diagnosis not present

## 2023-06-03 DIAGNOSIS — Z7901 Long term (current) use of anticoagulants: Secondary | ICD-10-CM | POA: Diagnosis not present

## 2023-06-03 DIAGNOSIS — E782 Mixed hyperlipidemia: Secondary | ICD-10-CM | POA: Diagnosis not present

## 2023-06-03 DIAGNOSIS — Z9181 History of falling: Secondary | ICD-10-CM | POA: Diagnosis not present

## 2023-06-03 DIAGNOSIS — Z905 Acquired absence of kidney: Secondary | ICD-10-CM | POA: Diagnosis not present

## 2023-06-03 DIAGNOSIS — I700 Atherosclerosis of aorta: Secondary | ICD-10-CM | POA: Diagnosis not present

## 2023-06-09 DIAGNOSIS — I10 Essential (primary) hypertension: Secondary | ICD-10-CM | POA: Diagnosis not present

## 2023-06-09 DIAGNOSIS — I700 Atherosclerosis of aorta: Secondary | ICD-10-CM | POA: Diagnosis not present

## 2023-06-09 DIAGNOSIS — E782 Mixed hyperlipidemia: Secondary | ICD-10-CM | POA: Diagnosis not present

## 2023-06-09 DIAGNOSIS — Z7984 Long term (current) use of oral hypoglycemic drugs: Secondary | ICD-10-CM | POA: Diagnosis not present

## 2023-06-09 DIAGNOSIS — Z9181 History of falling: Secondary | ICD-10-CM | POA: Diagnosis not present

## 2023-06-09 DIAGNOSIS — Z7901 Long term (current) use of anticoagulants: Secondary | ICD-10-CM | POA: Diagnosis not present

## 2023-06-09 DIAGNOSIS — G8929 Other chronic pain: Secondary | ICD-10-CM | POA: Diagnosis not present

## 2023-06-09 DIAGNOSIS — C20 Malignant neoplasm of rectum: Secondary | ICD-10-CM | POA: Diagnosis not present

## 2023-06-09 DIAGNOSIS — Z905 Acquired absence of kidney: Secondary | ICD-10-CM | POA: Diagnosis not present

## 2023-06-09 DIAGNOSIS — E119 Type 2 diabetes mellitus without complications: Secondary | ICD-10-CM | POA: Diagnosis not present

## 2023-06-09 DIAGNOSIS — M47817 Spondylosis without myelopathy or radiculopathy, lumbosacral region: Secondary | ICD-10-CM | POA: Diagnosis not present

## 2023-06-09 DIAGNOSIS — F319 Bipolar disorder, unspecified: Secondary | ICD-10-CM | POA: Diagnosis not present

## 2023-06-13 DIAGNOSIS — M5416 Radiculopathy, lumbar region: Secondary | ICD-10-CM | POA: Diagnosis not present

## 2023-06-13 DIAGNOSIS — G959 Disease of spinal cord, unspecified: Secondary | ICD-10-CM | POA: Diagnosis not present

## 2023-06-16 ENCOUNTER — Other Ambulatory Visit: Payer: Self-pay | Admitting: Otolaryngology

## 2023-06-16 DIAGNOSIS — H6062 Unspecified chronic otitis externa, left ear: Secondary | ICD-10-CM | POA: Diagnosis not present

## 2023-06-16 DIAGNOSIS — H9222 Otorrhagia, left ear: Secondary | ICD-10-CM

## 2023-06-16 DIAGNOSIS — H9202 Otalgia, left ear: Secondary | ICD-10-CM | POA: Diagnosis not present

## 2023-06-19 ENCOUNTER — Ambulatory Visit
Admission: RE | Admit: 2023-06-19 | Discharge: 2023-06-19 | Disposition: A | Discharge status: Home / Self Care | Payer: Medicare HMO | Source: Ambulatory Visit | Attending: Otolaryngology | Admitting: Otolaryngology

## 2023-06-19 DIAGNOSIS — H9222 Otorrhagia, left ear: Secondary | ICD-10-CM

## 2023-06-20 ENCOUNTER — Ambulatory Visit
Admission: RE | Admit: 2023-06-20 | Discharge: 2023-06-20 | Disposition: A | Discharge status: Home / Self Care | Payer: Medicare HMO | Source: Ambulatory Visit | Attending: Otolaryngology | Admitting: Otolaryngology

## 2023-06-20 DIAGNOSIS — H9222 Otorrhagia, left ear: Secondary | ICD-10-CM | POA: Diagnosis not present

## 2023-06-20 DIAGNOSIS — M5416 Radiculopathy, lumbar region: Secondary | ICD-10-CM | POA: Diagnosis not present

## 2023-06-20 MED ORDER — IOPAMIDOL (ISOVUE-300) INJECTION 61%
75.0000 mL | Freq: Once | INTRAVENOUS | Status: AC | PRN
  Administered 2023-06-20: 75 mL via INTRAVENOUS

## 2023-06-30 NOTE — Progress Notes (Deleted)
Referring Physician:  Leanord Asal, Nelva Bush, MD [address] [place],  [place] [address]  Primary Physician:  Pcp, No  History of Present Illness: 06/30/2023*** [name] has a history of hyperlipidemia, HTN, GERD, PAD, CVA, chronic hep C, DM, chronic pain, bipolar depression, and polysubstance abuse.   He is s/p right nephrectomy earlier this year. He has an indwelling foley catheter. Sees urology at [facility]. ***  He has saw Dr. Noralyn Pick at [facility] on 06/13/23. He has history of lumbar fusion L5-S1.         Duration: *** Location: *** Quality: *** Severity: ***  Precipitating: aggravated by *** Modifying factors: made better by *** Weakness: none Timing: *** Bowel/Bladder Dysfunction: none  Conservative measures:  Physical therapy: ***  Multimodal medical therapy including regular antiinflammatories: ***  Injections: *** epidural steroid injections  Past Surgery:  history of lumbar fusion L5-S1  [name] has ***no symptoms of cervical myelopathy.  The symptoms are causing a significant impact on the patient's life.   Review of Systems:  A 10 point review of systems is negative, except for the pertinent positives and negatives detailed in the HPI.  Past Medical History: Past Medical History:  Diagnosis Date   Acute right PCA stroke (HCC) 03/12/2018   a.) residual loss of peripheral vision and balance issues   Anemia    Anxiety    Bilateral carotid artery stenosis    Cerebral atherosclerosis 03/13/2018   a.) CTA neck 03/13/2018: proximal inferior division RIGHT M2 segment stenosis with normal distal opacification; mod stenosis of the distal RIGHT P2 segment (likely culprit of 03/12/2018 CVA); diffuse small vessel Dz   Cervical spondylosis    Chronic back pain    a.) followed by pain mgmt at [facility]   DDD (degenerative disc disease), lumbar    Depression    GERD (gastroesophageal reflux disease)    HCV (hepatitis C virus)    a.) s/p Tx with  ledipasvir/sofosbuvir   History of kidney stones    Hypertension    Leg ulcer, left (HCC) 2023   has been seen at the wound clinic   Lesion of BILATERAL parotid glands 03/13/2018   a.) CTA neck 03/13/2018: RIGHT (1.3 x 1.8 x 2.4 cm) and LEFT (1.5 x 0.9 1.2 cm); b.) CT soft tissue neck 04/05/2021: RIGHT (2.3 x 1.6 cm) and LEFT (2.3 x 1.6 cm)   Long term current use of antithrombotics/antiplatelets    a.) daily DAPT therapy (ASA + clopidogrel)   Overdose of benzodiazepine    Overuse of medication 02/07/2022   a.) admitted to [facility] with decreased LOC secondary to overuse of "Delta 8 THC gummies"   Peripheral vascular disease (HCC)    Polysubstance abuse (HCC)    a.) marijuana + cocaine + heroin + BZO   Pre-diabetes    Right-sided Bell's palsy    Unresponsive episode 02/07/2022   Unsteady gait    a.) following CVA in 2019; uses ambulation aid (walker) for balance   Wears dentures    Has full upper.  Does not wear.    Past Surgical History: Past Surgical History:  Procedure Laterality Date   ANTERIOR LUMBAR FUSION  05/13/2012   Procedure: ANTERIOR LUMBAR FUSION 1 LEVEL;  Surgeon: Venita Lick, MD;  Location: [facility];  Service: Orthopedics;  Laterality: N/A;  ALIF L5-S1   CATARACT EXTRACTION W/PHACO Left 01/01/2021   Procedure: CATARACT EXTRACTION PHACO AND INTRAOCULAR LENS PLACEMENT (IOC) LEFT DIABETIC 2.28 00:24.7;  Surgeon: Nevada Crane,  MD;  Location: [facility];  Service: Ophthalmology;  Laterality: Left;  covid + 11-30-20   CATARACT EXTRACTION W/PHACO Right 01/15/2021   Procedure: CATARACT EXTRACTION PHACO AND INTRAOCULAR LENS PLACEMENT (IOC) RIGHT DIABETIC;  Surgeon: Nevada Crane, MD;  Location: [facility];  Service: Ophthalmology;  Laterality: Right;  2.62 0:34.5   CHOLECYSTECTOMY     COLONOSCOPY WITH PROPOFOL N/A 09/19/2021   Procedure: COLONOSCOPY WITH PROPOFOL;  Surgeon: Toledo, Boykin Nearing, MD;  Location: [facility];  Service: Gastroenterology;   Laterality: N/A;   CYSTOSCOPY W/ RETROGRADES Left 08/04/2018   Procedure: CYSTOSCOPY WITH RETROGRADE PYELOGRAM;  Surgeon: Riki Altes, MD;  Location: [facility];  Service: Urology;  Laterality: Left;   CYSTOSCOPY W/ URETERAL STENT PLACEMENT Right 07/30/2022   Procedure: CYSTOSCOPY WITH URETERAL STENT REPLACEMENT;  Surgeon: Riki Altes, MD;  Location: [facility];  Service: Urology;  Laterality: Right;   CYSTOSCOPY WITH INSERTION OF UROLIFT N/A 01/18/2020   Procedure: CYSTOSCOPY WITH INSERTION OF UROLIFT;  Surgeon: Riki Altes, MD;  Location: [facility];  Service: Urology;  Laterality: N/A;   CYSTOSCOPY WITH RETROGRADE PYELOGRAM, URETEROSCOPY AND STENT PLACEMENT Left 06/18/2022   Procedure: CYSTOSCOPY WITH RETROGRADE PYELOGRAM;  Surgeon: Riki Altes, MD;  Location: [facility];  Service: Urology;  Laterality: Left;   CYSTOSCOPY WITH STENT PLACEMENT Left 08/04/2018   Procedure: CYSTOSCOPY WITH STENT PLACEMENT;  Surgeon: Riki Altes, MD;  Location: [facility];  Service: Urology;  Laterality: Left;   CYSTOSCOPY WITH URETEROSCOPY AND STENT PLACEMENT Right 06/18/2022   Procedure: CYSTOSCOPY WITH URETEROSCOPY AND STENT PLACEMENT;  Surgeon: Riki Altes, MD;  Location: [facility];  Service: Urology;  Laterality: Right;   EXTRACORPOREAL SHOCK WAVE LITHOTRIPSY  2012   EXTRACORPOREAL SHOCK WAVE LITHOTRIPSY Left 08/20/2018   Procedure: EXTRACORPOREAL SHOCK WAVE LITHOTRIPSY (ESWL);  Surgeon: Vanna Scotland, MD;  Location: [facility];  Service: Urology;  Laterality: Left; (cancelled)   EXTRACORPOREAL SHOCK WAVE LITHOTRIPSY Left 08/27/2018   Procedure: EXTRACORPOREAL SHOCK WAVE LITHOTRIPSY (ESWL);  Surgeon: Riki Altes, MD;  Location: [facility];  Service: Urology;  Laterality: Left;   EXTRACORPOREAL SHOCK WAVE LITHOTRIPSY Right 10/15/2018   Procedure: EXTRACORPOREAL SHOCK WAVE LITHOTRIPSY (ESWL);  Surgeon: Sondra Come, MD;  Location: [facility];  Service: Urology;  Laterality: Right;   KIDNEY  STONE SURGERY     URETERAL BIOPSY Right 07/30/2022   Procedure: URETERAL BIOPSY OF RENAL PELVIS;  Surgeon: Riki Altes, MD;  Location: [facility];  Service: Urology;  Laterality: Right;   URETEROSCOPY Right 07/30/2022   Procedure: URETEROSCOPY;  Surgeon: Riki Altes, MD;  Location: [facility];  Service: Urology;  Laterality: Right;    Allergies: Allergies as of 07/07/2023   (No Known Allergies)    Medications: Outpatient Encounter Medications as of 07/07/2023  Medication Sig   amLODipine (NORVASC) 2.5 MG tablet Take 2.5 mg by mouth every morning.   aspirin EC 81 MG EC tablet Take 1 tablet (81 mg total) by mouth daily. Swallow whole.   atorvastatin (LIPITOR) 40 MG tablet Take 40 mg by mouth every morning.   baclofen (LIORESAL) 10 MG tablet Take 10 mg by mouth 3 (three) times daily.   clopidogrel (PLAVIX) 75 MG tablet Take 75 mg by mouth daily.   escitalopram (LEXAPRO) 10 MG tablet Take 10 mg by mouth every morning.   folic acid (FOLVITE) 1 MG tablet Take 1 tablet (1 mg total) by mouth daily.   furosemide (LASIX) 20 MG tablet Take 20 mg by mouth  daily.   HYDROcodone-acetaminophen (NORCO/VICODIN) 5-325 MG tablet Take 1 tablet by mouth every 8 (eight) hours as needed for moderate pain.   LORazepam (ATIVAN) 0.5 MG tablet Take 0.5 mg by mouth daily as needed.   Meth-Hyo-M Bl-Na Phos-Ph Sal (URIBEL) 118 MG CAPS Take 1 capsule (118 mg total) by mouth 4 (four) times daily as needed (burning pain with urination).   mirabegron ER (MYRBETRIQ) 50 MG TB24 tablet Take 1 tablet (50 mg total) by mouth daily.   ondansetron (ZOFRAN-ODT) 4 MG disintegrating tablet Take 1 tablet (4 mg total) by mouth every 8 (eight) hours as needed.   QUEtiapine (SEROQUEL) 300 MG tablet Take 300 mg by mouth at bedtime.   risperiDONE (RISPERDAL) 0.5 MG tablet Take 0.5 mg by mouth every morning.   tamsulosin (FLOMAX) 0.4 MG CAPS capsule Take 1 capsule (0.4 mg total) by mouth daily.   traMADol (ULTRAM) 50 MG tablet Take  1 tablet (50 mg total) by mouth every 12 (twelve) hours as needed.   traZODone (DESYREL) 50 MG tablet Take 50 mg by mouth at bedtime.   trospium (SANCTURA) 20 MG tablet Take 1 tablet (20 mg total) by mouth 2 (two) times daily as needed (Frequency, urgency, bladder spasm).   No facility-administered encounter medications on file as of 07/07/2023.    Social History: Social History   Tobacco Use   Smoking status: Every Day    Current packs/day: 0.50    Average packs/day: 0.5 packs/day for 53.0 years (26.5 ttl pk-yrs)    Types: Cigarettes   Smokeless tobacco: Never  Vaping Use   Vaping status: Never Used  Substance Use Topics   Alcohol use: Never    Alcohol/week: 0.0 standard drinks of alcohol   Drug use: Not Currently    Types: Heroin, Cocaine, Marijuana    Comment: 1960's    Family Medical History: Family History  Problem Relation Age of Onset   Stroke Mother    Diabetes Mother    Lung cancer Father    Diabetes Father     Physical Examination: There were no vitals filed for this visit.  General: Patient is well developed, well nourished, calm, collected, and in no apparent distress. Attention to examination is appropriate.  Respiratory: Patient is breathing without any difficulty.   NEUROLOGICAL:     Awake, alert, oriented to person, place, and time.  Speech is clear and fluent. Fund of knowledge is appropriate.   Cranial Nerves: Pupils equal round and reactive to light.  Facial tone is symmetric.    *** ROM of cervical spine *** pain *** posterior cervical tenderness. *** tenderness in bilateral trapezial region.   *** ROM of lumbar spine *** pain *** posterior lumbar tenderness.   No abnormal lesions on exposed skin.   Strength: Side Biceps Triceps Deltoid Interossei Grip Wrist Ext. Wrist Flex.  R 5 5 5 5 5 5 5   L 5 5 5 5 5 5 5    Side Iliopsoas Quads Hamstring PF DF EHL  R 5 5 5 5 5 5   L 5 5 5 5 5 5    Reflexes are ***2+ and symmetric at the biceps,  brachioradialis, patella and achilles.   Hoffman's is absent.  Clonus is not present.   Bilateral upper and lower extremity sensation is intact to light touch.     Gait is normal.   ***No difficulty with tandem gait.    Medical Decision Making  Imaging: Lumbar imaging dated 05/05/23:  FINDINGS: Frontal, bilateral oblique, and lateral views  of the lumbar spine are obtained. Stable postsurgical changes from discectomy and anterior fusion at the L5-S1 level. Alignment is anatomic. No acute fractures. Mild disc space narrowing at L3-4 and L4-5. There is moderate facet hypertrophy throughout the lumbar spine greatest at L4-5 and L5-S1. Sacroiliac joints are unremarkable.   IMPRESSION: 1. Stable discectomy and anterior fusion at L5-S1. 2. Mild spondylosis and moderate facet hypertrophy within the mid to lower lumbar spine, with slight progression since prior study. 3. No acute bony abnormality.     Electronically Signed   By: Sharlet Salina M.D.   On: 05/12/2023 11:50  CT of cervical spine dated 10/10/20:  CT CERVICAL SPINE FINDINGS   Alignment: Normal cervical lordosis.   Skull base and vertebrae: No acute fracture. No primary bone lesion or focal pathologic process.   Soft tissues and spinal canal: No prevertebral fluid or swelling. No visible canal hematoma.   Disc levels:  Mild degenerative changes at C5-6.   Upper chest: Visualized lung apices are clear.   Other: Visualized thyroid is unremarkable.   IMPRESSION:  No evidence of traumatic injury to the cervical spine. Mild degenerative changes.     Electronically Signed   By: Charline Bills M.D.   On: 10/10/2020 10:56    I have personally reviewed the images and agree with the above interpretation.  Assessment and Plan: Mr. Szeto is a pleasant 77 y.o. male has ***  Treatment options discussed with patient and following plan made:   - Order for physical therapy for *** spine ***. Patient to call to  schedule appointment. *** - Continue current medications including ***. Reviewed dosing and side effects.  - Prescription for ***. Reviewed dosing and side effects. Take with food.  - Prescription for *** to take prn muscle spasms. Reviewed dosing and side effects. Discussed this can cause drowsiness.  - MRI of *** to further evaluate *** radiculopathy. No improvement time or medications (***).  - Referral to PMR at [facility] to discuss possible *** injections.  - Will schedule phone visit to review MRI results once I get them back.   I spent a total of *** minutes in face-to-face and non-face-to-face activities related to this patient's care today including review of outside records, review of imaging, review of symptoms, physical exam, discussion of differential diagnosis, discussion of treatment options, and documentation.   Thank you for involving me in the care of this patient.   Drake Leach PA-C Dept. of Neurosurgery

## 2023-07-04 NOTE — Progress Notes (Unsigned)
Referring Physician:  Leanord Asal, Nelva Bush, MD [address] [place],  [place] [address]  Primary Physician:  Pcp, No  History of Present Illness: 07/09/2023 [name] has a history of hyperlipidemia, HTN, GERD, PAD, CVA, chronic hep C, DM, chronic pain, bipolar depression, and polysubstance abuse.   He is s/p right nephrectomy earlier this year. Sees urology at [facility].   He has saw Dr. Noralyn Pick at [facility] on 06/13/23. He has history of lumbar fusion L5-S1 in 2013. Dr. Noralyn Pick ordered MRI of his cervical and thoracic spine due to concerns of myelopathy.   He has 2 year history of constant LBP with bilateral posterior leg pain to his feet. He has numbness and tingling in both legs. No specific aggravating and alleviating factors. He has weakness in his legs.   He has trouble walking. He notes issues with his balance x 6-7 months. He has no dexterity issues in his hands. He had CVA in 2019 with some balance issues, but this is much worse.   He takes PLAVIX.   Bowel/Bladder Dysfunction: he has both bowel and bladder urgency for last year and a half. No perineal numbness.   He smokes 1/2 ppd x 50+ years.   Conservative measures:  Physical therapy: had PT and was discharged 1 month ago  Multimodal medical therapy including regular antiinflammatories:  muscle relaxers, percocet Injections: No recent epidural steroid injections  Past Surgery:  history of lumbar fusion L5-S1 in 2013  The symptoms are causing a significant impact on the patient's life.   Review of Systems:  A 10 point review of systems is negative, except for the pertinent positives and negatives detailed in the HPI.  Past Medical History: Past Medical History:  Diagnosis Date   Acute right PCA stroke (HCC) 03/12/2018   a.) residual loss of peripheral vision and balance issues   Anemia    Anxiety    Bilateral carotid artery stenosis    Cerebral atherosclerosis 03/13/2018   a.) CTA neck 03/13/2018:  proximal inferior division RIGHT M2 segment stenosis with normal distal opacification; mod stenosis of the distal RIGHT P2 segment (likely culprit of 03/12/2018 CVA); diffuse small vessel Dz   Cervical spondylosis    Chronic back pain    a.) followed by pain mgmt at [facility]   DDD (degenerative disc disease), lumbar    Depression    GERD (gastroesophageal reflux disease)    HCV (hepatitis C virus)    a.) s/p Tx with ledipasvir/sofosbuvir   History of kidney stones    Hypertension    Leg ulcer, left (HCC) 2023   has been seen at the wound clinic   Lesion of BILATERAL parotid glands 03/13/2018   a.) CTA neck 03/13/2018: RIGHT (1.3 x 1.8 x 2.4 cm) and LEFT (1.5 x 0.9 1.2 cm); b.) CT soft tissue neck 04/05/2021: RIGHT (2.3 x 1.6 cm) and LEFT (2.3 x 1.6 cm)   Long term current use of antithrombotics/antiplatelets    a.) daily DAPT therapy (ASA + clopidogrel)   Overdose of benzodiazepine    Overuse of medication 02/07/2022   a.) admitted to [facility] with decreased LOC secondary to overuse of "Delta 8 THC gummies"   Peripheral vascular disease (HCC)    Polysubstance abuse (HCC)    a.) marijuana + cocaine + heroin + BZO   Pre-diabetes    Right-sided Bell's palsy    Unresponsive episode 02/07/2022   Unsteady gait    a.) following CVA in 2019; uses ambulation aid (walker)  for balance   Wears dentures    Has full upper.  Does not wear.    Past Surgical History: Past Surgical History:  Procedure Laterality Date   ANTERIOR LUMBAR FUSION  05/13/2012   Procedure: ANTERIOR LUMBAR FUSION 1 LEVEL;  Surgeon: Venita Lick, MD;  Location: [facility] OR;  Service: Orthopedics;  Laterality: N/A;  ALIF L5-S1   CATARACT EXTRACTION W/PHACO Left 01/01/2021   Procedure: CATARACT EXTRACTION PHACO AND INTRAOCULAR LENS PLACEMENT (IOC) LEFT DIABETIC 2.28 00:24.7;  Surgeon: Nevada Crane, MD;  Location: [facility];  Service: Ophthalmology;  Laterality: Left;  covid + 11-30-20   CATARACT EXTRACTION W/PHACO Right  01/15/2021   Procedure: CATARACT EXTRACTION PHACO AND INTRAOCULAR LENS PLACEMENT (IOC) RIGHT DIABETIC;  Surgeon: Nevada Crane, MD;  Location: [facility];  Service: Ophthalmology;  Laterality: Right;  2.62 0:34.5   CHOLECYSTECTOMY     COLONOSCOPY WITH PROPOFOL N/A 09/19/2021   Procedure: COLONOSCOPY WITH PROPOFOL;  Surgeon: Toledo, Boykin Nearing, MD;  Location: [facility];  Service: Gastroenterology;  Laterality: N/A;   CYSTOSCOPY W/ RETROGRADES Left 08/04/2018   Procedure: CYSTOSCOPY WITH RETROGRADE PYELOGRAM;  Surgeon: Riki Altes, MD;  Location: [facility] OR[facility];  Service: Urology;  Laterality: Left;   CYSTOSCOPY W/ URETERAL STENT PLACEMENT Right 07/30/2022   Procedure: CYSTOSCOPY WITH URETERAL STENT REPLACEMENT;  Surgeon: Riki Altes, MD;  Location: [facility] OR[facility];  Service: Urology;  Laterality: Right;   CYSTOSCOPY WITH INSERTION OF UROLIFT N/A 01/18/2020   Procedure: CYSTOSCOPY WITH INSERTION OF UROLIFT;  Surgeon: Riki Altes, MD;  Location: [facility];  Service: Urology;  Laterality: N/A;   CYSTOSCOPY WITH RETROGRADE PYELOGRAM, URETEROSCOPY AND STENT PLACEMENT Left 06/18/2022   Procedure: CYSTOSCOPY WITH RETROGRADE PYELOGRAM;  Surgeon: Riki Altes, MD;  Location: [facility];  Service: Urology;  Laterality: Left;   CYSTOSCOPY WITH STENT PLACEMENT Left 08/04/2018   Procedure: CYSTOSCOPY WITH STENT PLACEMENT;  Surgeon: Riki Altes, MD;  Location: [facility];  Service: Urology;  Laterality: Left;   CYSTOSCOPY WITH URETEROSCOPY AND STENT PLACEMENT Right 06/18/2022   Procedure: CYSTOSCOPY WITH URETEROSCOPY AND STENT PLACEMENT;  Surgeon: Riki Altes, MD;  Location: [facility];  Service: Urology;  Laterality: Right;   EXTRACORPOREAL SHOCK WAVE LITHOTRIPSY  2012   EXTRACORPOREAL SHOCK WAVE LITHOTRIPSY Left 08/20/2018   Procedure: EXTRACORPOREAL SHOCK WAVE LITHOTRIPSY (ESWL);  Surgeon: Vanna Scotland, MD;  Location: [facility];  Service: Urology;  Laterality: Left; (cancelled)    EXTRACORPOREAL SHOCK WAVE LITHOTRIPSY Left 08/27/2018   Procedure: EXTRACORPOREAL SHOCK WAVE LITHOTRIPSY (ESWL);  Surgeon: Riki Altes, MD;  Location: [facility];  Service: Urology;  Laterality: Left;   EXTRACORPOREAL SHOCK WAVE LITHOTRIPSY Right 10/15/2018   Procedure: EXTRACORPOREAL SHOCK WAVE LITHOTRIPSY (ESWL);  Surgeon: Sondra Come, MD;  Location: [facility];  Service: Urology;  Laterality: Right;   KIDNEY STONE SURGERY     URETERAL BIOPSY Right 07/30/2022   Procedure: URETERAL BIOPSY OF RENAL PELVIS;  Surgeon: Riki Altes, MD;  Location: [facility];  Service: Urology;  Laterality: Right;   URETEROSCOPY Right 07/30/2022   Procedure: URETEROSCOPY;  Surgeon: Riki Altes, MD;  Location: [facility];  Service: Urology;  Laterality: Right;    Allergies: Allergies as of 07/09/2023   (No Known Allergies)    Medications: Outpatient Encounter Medications as of 07/09/2023  Medication Sig   amLODipine (NORVASC) 2.5 MG tablet Take 2.5 mg by mouth every morning.   aspirin EC 81 MG EC tablet Take 1 tablet (81 mg total) by mouth  daily. Swallow whole.   atorvastatin (LIPITOR) 40 MG tablet Take 40 mg by mouth every morning.   clopidogrel (PLAVIX) 75 MG tablet Take 75 mg by mouth daily.   escitalopram (LEXAPRO) 10 MG tablet Take 10 mg by mouth every morning.   folic acid (FOLVITE) 1 MG tablet Take 1 tablet (1 mg total) by mouth daily.   furosemide (LASIX) 20 MG tablet Take 20 mg by mouth daily.   QUEtiapine (SEROQUEL) 300 MG tablet Take 300 mg by mouth at bedtime.   LORazepam (ATIVAN) 0.5 MG tablet Take 0.5 mg by mouth daily as needed. (Patient not taking: Reported on 07/09/2023)   [DISCONTINUED] baclofen (LIORESAL) 10 MG tablet Take 10 mg by mouth 3 (three) times daily.   [DISCONTINUED] HYDROcodone-acetaminophen (NORCO/VICODIN) 5-325 MG tablet Take 1 tablet by mouth every 8 (eight) hours as needed for moderate pain.   [DISCONTINUED] Meth-Hyo-M Bl-Na Phos-Ph Sal (URIBEL) 118 MG CAPS Take 1  capsule (118 mg total) by mouth 4 (four) times daily as needed (burning pain with urination).   [DISCONTINUED] mirabegron ER (MYRBETRIQ) 50 MG TB24 tablet Take 1 tablet (50 mg total) by mouth daily.   [DISCONTINUED] ondansetron (ZOFRAN-ODT) 4 MG disintegrating tablet Take 1 tablet (4 mg total) by mouth every 8 (eight) hours as needed.   [DISCONTINUED] risperiDONE (RISPERDAL) 0.5 MG tablet Take 0.5 mg by mouth every morning.   [DISCONTINUED] tamsulosin (FLOMAX) 0.4 MG CAPS capsule Take 1 capsule (0.4 mg total) by mouth daily.   [DISCONTINUED] traMADol (ULTRAM) 50 MG tablet Take 1 tablet (50 mg total) by mouth every 12 (twelve) hours as needed.   [DISCONTINUED] traZODone (DESYREL) 50 MG tablet Take 50 mg by mouth at bedtime.   [DISCONTINUED] trospium (SANCTURA) 20 MG tablet Take 1 tablet (20 mg total) by mouth 2 (two) times daily as needed (Frequency, urgency, bladder spasm).   No facility-administered encounter medications on file as of 07/09/2023.    Social History: Social History   Tobacco Use   Smoking status: Every Day    Current packs/day: 0.50    Average packs/day: 0.5 packs/day for 53.0 years (26.5 ttl pk-yrs)    Types: Cigarettes   Smokeless tobacco: Never  Vaping Use   Vaping status: Never Used  Substance Use Topics   Alcohol use: Never    Alcohol/week: 0.0 standard drinks of alcohol   Drug use: Not Currently    Types: Heroin, Cocaine, Marijuana    Comment: 1960's    Family Medical History: Family History  Problem Relation Age of Onset   Stroke Mother    Diabetes Mother    Lung cancer Father    Diabetes Father     Physical Examination: Vitals:   07/09/23 1001  BP: 126/84    General: Patient is well developed, well nourished, calm, collected, and in no apparent distress. Attention to examination is appropriate.  Respiratory: Patient is breathing without any difficulty.   NEUROLOGICAL:     Awake, alert, oriented to person, place, and time.  Speech is clear  and fluent. Fund of knowledge is appropriate.   Cranial Nerves: Pupils equal round and reactive to light.  Facial tone is symmetric.    He has diffuse lower lumbar tenderness.   No abnormal lesions on exposed skin.   Strength: Side Biceps Triceps Deltoid Interossei Grip Wrist Ext. Wrist Flex.  R 5 5 5 5 5 5 5   L 5 5 5 5 5 5 5    Side Iliopsoas Quads Hamstring PF DF EHL  R 5 5  5 5 5 5   L 5 5 5 5 5 5    Reflexes are 2+ and symmetric at the biceps, brachioradialis, patella and achilles.   Hoffman's is positive bilaterally.  Clonus is not present.   Bilateral upper and lower extremity sensation is intact to light touch.     He ambulates with cane. Has slow unsteady gait.   Medical Decision Making  Imaging: Lumbar imaging dated 05/05/23:  FINDINGS: Frontal, bilateral oblique, and lateral views of the lumbar spine are obtained. Stable postsurgical changes from discectomy and anterior fusion at the L5-S1 level. Alignment is anatomic. No acute fractures. Mild disc space narrowing at L3-4 and L4-5. There is moderate facet hypertrophy throughout the lumbar spine greatest at L4-5 and L5-S1. Sacroiliac joints are unremarkable.   IMPRESSION: 1. Stable discectomy and anterior fusion at L5-S1. 2. Mild spondylosis and moderate facet hypertrophy within the mid to lower lumbar spine, with slight progression since prior study. 3. No acute bony abnormality.     Electronically Signed   By: Sharlet Salina M.D.   On: 05/12/2023 11:50  I have personally reviewed the images and agree with the above interpretation.  Assessment and Plan: Mr. Gaby is a pleasant 77 y.o. male who has a history of lumbar fusion L5-S1 in 2013.   Now with a 2 year history of constant LBP with bilateral posterior leg pain to his feet. He has numbness and tingling in both legs. He has weakness in his legs.   He has known ALIF L5-S1 with lumbar spondylosis.   History of CVA in 2019 with some balance issues that  have been much worse in last 6-7 months. He has trouble walking. No dexterity issues in his hands. He has positive hoffmans bilaterally with unsteady gait.   Treatment options discussed with patient and following plan made:   - Agree with MRIs of cervical and thoracic spine to evaluate for possible myelopathy. These have been done at [facility].  - He will get above MRIs (along with lumbar if done) on CD and drop off for my review.  - ROI signed to get records and MRI reports from [facility].  - He has f/u scheduled with [facility] but states he wants to follow up with [place].  - Once I get his imaging and records, I will review and then set up phone visit to discuss further plans.   I spent a total of 35 minutes in face-to-face and non-face-to-face activities related to this patient's care today including review of outside records, review of imaging, review of symptoms, physical exam, discussion of differential diagnosis, discussion of treatment options, and documentation.   Thank you for involving me in the care of this patient.   Drake Leach PA-C Dept. of Neurosurgery

## 2023-07-07 ENCOUNTER — Ambulatory Visit: Payer: Medicare PPO | Admitting: Orthopedic Surgery

## 2023-07-09 ENCOUNTER — Ambulatory Visit (INDEPENDENT_AMBULATORY_CARE_PROVIDER_SITE_OTHER): Payer: Medicare PPO | Admitting: Orthopedic Surgery

## 2023-07-09 ENCOUNTER — Encounter: Payer: Self-pay | Admitting: Orthopedic Surgery

## 2023-07-09 VITALS — BP 126/84 | Ht 63.0 in | Wt 142.0 lb

## 2023-07-09 DIAGNOSIS — R2689 Other abnormalities of gait and mobility: Secondary | ICD-10-CM | POA: Diagnosis not present

## 2023-07-09 DIAGNOSIS — M4726 Other spondylosis with radiculopathy, lumbar region: Secondary | ICD-10-CM

## 2023-07-09 DIAGNOSIS — M47816 Spondylosis without myelopathy or radiculopathy, lumbar region: Secondary | ICD-10-CM

## 2023-07-09 DIAGNOSIS — Z981 Arthrodesis status: Secondary | ICD-10-CM | POA: Diagnosis not present

## 2023-07-09 DIAGNOSIS — M5416 Radiculopathy, lumbar region: Secondary | ICD-10-CM

## 2023-07-09 NOTE — Patient Instructions (Signed)
It was so nice to see you today. Thank you so much for coming in.   Dr. Noralyn Pick at [facility] ordered MRI scans of your spine. You need call [facility] and ask that all the spine MRI scans be put on a CD for you to pick up. Drop the CD off here for me to review the imaging.   We will work on getting the MRI reports and your notes from [facility].   Once I have all this information, we will call you to schedule a phone visit follow up with me to review everything.   I am worried that you may have something going in your neck or mid back that is causing your balance issues.    You should hear from me a few days after you drop off the CD.   Please do not hesitate to call if you have any questions or concerns. You can also message me in MyChart.    Drake Leach PA-C [phone]

## 2023-09-02 ENCOUNTER — Ambulatory Visit (INDEPENDENT_AMBULATORY_CARE_PROVIDER_SITE_OTHER): Payer: Medicare PPO | Admitting: Podiatry

## 2023-09-02 ENCOUNTER — Encounter: Payer: Self-pay | Admitting: Podiatry

## 2023-09-02 VITALS — Ht 63.0 in | Wt 142.0 lb

## 2023-09-02 DIAGNOSIS — M79675 Pain in left toe(s): Secondary | ICD-10-CM | POA: Diagnosis not present

## 2023-09-02 DIAGNOSIS — M79674 Pain in right toe(s): Secondary | ICD-10-CM

## 2023-09-02 DIAGNOSIS — B351 Tinea unguium: Secondary | ICD-10-CM

## 2023-09-02 NOTE — Progress Notes (Signed)
  Subjective:  Patient ID: [name], male    DOB: Nov 15, 1945,  MRN: [id number]  Chief Complaint  Patient presents with   Toe Pain    PT states pain in both greater toes, and swelling    77 y.o. male returns for the above complaint.  Patient presents with thickened elongated dystrophic mycotic toenails x 10 mild pain on palpation worse with ambulation worse with pressure he would like me to debride down he is not able to do it himself.  Objective:  There were no vitals filed for this visit. Podiatric Exam: Vascular: dorsalis pedis and posterior tibial pulses are palpable bilateral. Capillary return is immediate. Temperature gradient is WNL. Skin turgor WNL  Sensorium: Normal Semmes Weinstein monofilament test. Normal tactile sensation bilaterally. Nail Exam: Pt has thick disfigured discolored nails with subungual debris noted bilateral entire nail hallux through fifth toenails.  Pain on palpation to the nails. Ulcer Exam: There is no evidence of ulcer or pre-ulcerative changes or infection. Orthopedic Exam: Muscle tone and strength are WNL. No limitations in general ROM. No crepitus or effusions noted.  Skin: No Porokeratosis. No infection or ulcers    Assessment & Plan:   No diagnosis found.   Patient was evaluated and treated and all questions answered.  Onychomycosis with pain  -Nails palliatively debrided as below. -Educated on self-care  Procedure: Nail Debridement Rationale: pain  Type of Debridement: manual, sharp debridement. Instrumentation: Nail nipper, rotary burr. Number of Nails: 10  Procedures and Treatment: Consent by patient was obtained for treatment procedures. The patient understood the discussion of treatment and procedures well. All questions were answered thoroughly reviewed. Debridement of mycotic and hypertrophic toenails, 1 through 5 bilateral and clearing of subungual debris. No ulceration, no infection noted.  Return Visit-Office Procedure:  Patient instructed to return to the office for a follow up visit 3 months for continued evaluation and treatment.  Nicholes Rough, DPM    No follow-ups on file.

## 2023-10-21 ENCOUNTER — Telehealth: Payer: Self-pay | Admitting: Orthopedic Surgery

## 2023-10-21 NOTE — Telephone Encounter (Signed)
 I reviewed his records from [facility].   Looks like MRI of cervical and thoracic spine were ordered by Dr. Dow on 09/10/23. I don't see these were done.   Can we find out if these were done? If so, I need reports and need imaging on CD.  If not, then he still needs them done. I can reorder or he can f/u with [facility] to get them done.   Lumbar MRI report from 06/20/23 has been scanned into chart showing moderate to severe central stenosis L2-L5.   I would get lumbar MRI on CD as well.   Thanks!

## 2023-10-21 NOTE — Telephone Encounter (Signed)
Unable to leave message on pt's voicemail

## 2023-10-29 NOTE — Telephone Encounter (Signed)
 Mailbox full unable to leave message.

## 2023-11-20 ENCOUNTER — Ambulatory Visit: Payer: No Typology Code available for payment source | Admitting: Psychiatry

## 2023-12-29 ENCOUNTER — Ambulatory Visit: Admitting: Physician Assistant

## 2023-12-29 VITALS — BP 130/72 | HR 106 | Ht 63.0 in | Wt 135.0 lb

## 2023-12-29 DIAGNOSIS — N3941 Urge incontinence: Secondary | ICD-10-CM | POA: Diagnosis not present

## 2023-12-29 DIAGNOSIS — R300 Dysuria: Secondary | ICD-10-CM | POA: Diagnosis not present

## 2023-12-29 LAB — MICROSCOPIC EXAMINATION

## 2023-12-29 LAB — URINALYSIS, COMPLETE
Bilirubin, UA: NEGATIVE
Ketones, UA: NEGATIVE
Leukocytes,UA: NEGATIVE
Nitrite, UA: NEGATIVE
Specific Gravity, UA: 1.015 (ref 1.005–1.030)
Urobilinogen, Ur: 0.2 mg/dL (ref 0.2–1.0)
pH, UA: 7 (ref 5.0–7.5)

## 2023-12-29 LAB — BLADDER SCAN AMB NON-IMAGING: Scan Result: 98

## 2023-12-29 MED ORDER — GEMTESA 75 MG PO TABS
75.0000 mg | ORAL_TABLET | Freq: Every day | ORAL | Status: DC
Start: 2023-12-29 — End: 2024-01-13

## 2023-12-29 MED ORDER — PHENAZOPYRIDINE HCL 200 MG PO TABS
200.0000 mg | ORAL_TABLET | Freq: Three times a day (TID) | ORAL | 11 refills | Status: DC | PRN
Start: 2023-12-29 — End: 2024-01-13

## 2023-12-29 NOTE — H&P (View-Only) (Signed)
 12/29/2023 1:14 PM   [name] 1946-10-06 [id number]  CC: Chief Complaint  Patient presents with   Urinary Frequency   HPI: [name] is a 78 y.o. male with PMH high-grade right upper tract urothelial carcinoma s/p right nephro ureterectomy with Dr. Laural Benes at [facility] in February 2024 with postop bladder leak, chronic right low back pain, nephrolithiasis, BPH with LUTS s/p UroLift in 2021, bipolar 1 disorder with anxiety, and polysubstance abuse who presents today for evaluation of possible worms in his urine.  He is accompanied today by his wife.  Today he reports about 2 months of dysuria and the appearance of something wormlike in his urine.  He describes them as dark brown and thready in appearance.  He sees them about once per week.  He also has a chronic history of urge incontinence and wears Depends.  In-office UA today positive for 3+ glucose, trace intact blood, and 1+ protein; urine microscopy with 11-30 RBCs/HPF. PVR 98mL.  PMH: Past Medical History:  Diagnosis Date   Acute right PCA stroke (HCC) 03/12/2018   a.) residual loss of peripheral vision and balance issues   Anemia    Anxiety    Bilateral carotid artery stenosis    Cerebral atherosclerosis 03/13/2018   a.) CTA neck 03/13/2018: proximal inferior division RIGHT M2 segment stenosis with normal distal opacification; mod stenosis of the distal RIGHT P2 segment (likely culprit of 03/12/2018 CVA); diffuse small vessel Dz   Cervical spondylosis    Chronic back pain    a.) followed by pain mgmt at [facility]   DDD (degenerative disc disease), lumbar    Depression    GERD (gastroesophageal reflux disease)    HCV (hepatitis C virus)    a.) s/p Tx with ledipasvir/sofosbuvir   History of kidney stones    Hypertension    Leg ulcer, left (HCC) 2023   has been seen at the wound clinic   Lesion of BILATERAL parotid glands 03/13/2018   a.) CTA neck 03/13/2018: RIGHT (1.3 x 1.8 x 2.4 cm) and LEFT (1.5 x 0.9 1.2 cm); b.)  CT soft tissue neck 04/05/2021: RIGHT (2.3 x 1.6 cm) and LEFT (2.3 x 1.6 cm)   Long term current use of antithrombotics/antiplatelets    a.) daily DAPT therapy (ASA + clopidogrel)   Overdose of benzodiazepine    Overuse of medication 02/07/2022   a.) admitted to [facility] with decreased LOC secondary to overuse of "Delta 8 THC gummies"   Peripheral vascular disease (HCC)    Polysubstance abuse (HCC)    a.) marijuana + cocaine + heroin + BZO   Pre-diabetes    Right-sided Bell's palsy    Unresponsive episode 02/07/2022   Unsteady gait    a.) following CVA in 2019; uses ambulation aid (walker) for balance   Wears dentures    Has full upper.  Does not wear.    Surgical History: Past Surgical History:  Procedure Laterality Date   ANTERIOR LUMBAR FUSION  05/13/2012   Procedure: ANTERIOR LUMBAR FUSION 1 LEVEL;  Surgeon: Venita Lick, MD;  Location: [facility];  Service: Orthopedics;  Laterality: N/A;  ALIF L5-S1   CATARACT EXTRACTION W/PHACO Left 01/01/2021   Procedure: CATARACT EXTRACTION PHACO AND INTRAOCULAR LENS PLACEMENT (IOC) LEFT DIABETIC 2.28 00:24.7;  Surgeon: Nevada Crane, MD;  Location: [facility];  Service: Ophthalmology;  Laterality: Left;  covid + 11-30-20   CATARACT EXTRACTION W/PHACO Right 01/15/2021   Procedure: CATARACT EXTRACTION PHACO AND INTRAOCULAR LENS PLACEMENT (IOC) RIGHT  DIABETIC;  Surgeon: Nevada Crane, MD;  Location: [facility];  Service: Ophthalmology;  Laterality: Right;  2.62 0:34.5   CHOLECYSTECTOMY     COLONOSCOPY WITH PROPOFOL N/A 09/19/2021   Procedure: COLONOSCOPY WITH PROPOFOL;  Surgeon: Toledo, Boykin Nearing, MD;  Location: [facility];  Service: Gastroenterology;  Laterality: N/A;   CYSTOSCOPY W/ RETROGRADES Left 08/04/2018   Procedure: CYSTOSCOPY WITH RETROGRADE PYELOGRAM;  Surgeon: Riki Altes, MD;  Location: [facility];  Service: Urology;  Laterality: Left;   CYSTOSCOPY W/ URETERAL STENT PLACEMENT Right 07/30/2022   Procedure:  CYSTOSCOPY WITH URETERAL STENT REPLACEMENT;  Surgeon: Riki Altes, MD;  Location: [facility];  Service: Urology;  Laterality: Right;   CYSTOSCOPY WITH INSERTION OF UROLIFT N/A 01/18/2020   Procedure: CYSTOSCOPY WITH INSERTION OF UROLIFT;  Surgeon: Riki Altes, MD;  Location: [facility];  Service: Urology;  Laterality: N/A;   CYSTOSCOPY WITH RETROGRADE PYELOGRAM, URETEROSCOPY AND STENT PLACEMENT Left 06/18/2022   Procedure: CYSTOSCOPY WITH RETROGRADE PYELOGRAM;  Surgeon: Riki Altes, MD;  Location: [facility];  Service: Urology;  Laterality: Left;   CYSTOSCOPY WITH STENT PLACEMENT Left 08/04/2018   Procedure: CYSTOSCOPY WITH STENT PLACEMENT;  Surgeon: Riki Altes, MD;  Location: [facility];  Service: Urology;  Laterality: Left;   CYSTOSCOPY WITH URETEROSCOPY AND STENT PLACEMENT Right 06/18/2022   Procedure: CYSTOSCOPY WITH URETEROSCOPY AND STENT PLACEMENT;  Surgeon: Riki Altes, MD;  Location: [facility];  Service: Urology;  Laterality: Right;   EXTRACORPOREAL SHOCK WAVE LITHOTRIPSY  2012   EXTRACORPOREAL SHOCK WAVE LITHOTRIPSY Left 08/20/2018   Procedure: EXTRACORPOREAL SHOCK WAVE LITHOTRIPSY (ESWL);  Surgeon: Vanna Scotland, MD;  Location: [facility];  Service: Urology;  Laterality: Left; (cancelled)   EXTRACORPOREAL SHOCK WAVE LITHOTRIPSY Left 08/27/2018   Procedure: EXTRACORPOREAL SHOCK WAVE LITHOTRIPSY (ESWL);  Surgeon: Riki Altes, MD;  Location: [facility];  Service: Urology;  Laterality: Left;   EXTRACORPOREAL SHOCK WAVE LITHOTRIPSY Right 10/15/2018   Procedure: EXTRACORPOREAL SHOCK WAVE LITHOTRIPSY (ESWL);  Surgeon: Sondra Come, MD;  Location: [facility];  Service: Urology;  Laterality: Right;   KIDNEY STONE SURGERY     URETERAL BIOPSY Right 07/30/2022   Procedure: URETERAL BIOPSY OF RENAL PELVIS;  Surgeon: Riki Altes, MD;  Location: [facility];  Service: Urology;  Laterality: Right;   URETEROSCOPY Right 07/30/2022   Procedure: URETEROSCOPY;  Surgeon: Riki Altes, MD;  Location: [facility];  Service: Urology;  Laterality: Right;    Home Medications:  Allergies as of 12/29/2023   No Known Allergies      Medication List        Accurate as of December 29, 2023  1:14 PM. If you have any questions, ask your nurse or doctor.          amLODipine 2.5 MG tablet Commonly known as: NORVASC Take 2.5 mg by mouth every morning.   aspirin EC 81 MG tablet Take 1 tablet (81 mg total) by mouth daily. Swallow whole.   atorvastatin 40 MG tablet Commonly known as: LIPITOR Take 40 mg by mouth every morning.   clopidogrel 75 MG tablet Commonly known as: PLAVIX Take 75 mg by mouth daily.   escitalopram 10 MG tablet Commonly known as: LEXAPRO Take 10 mg by mouth every morning.   folic acid 1 MG tablet Commonly known as: FOLVITE Take 1 tablet (1 mg total) by mouth daily.   furosemide 20 MG tablet Commonly known as: LASIX Take 20 mg by mouth daily.   Jardiance 25  MG Tabs tablet Generic drug: empagliflozin Take 25 mg by mouth daily.   LORazepam 0.5 MG tablet Commonly known as: ATIVAN Take 0.5 mg by mouth daily as needed.   QUEtiapine 300 MG tablet Commonly known as: SEROQUEL Take 300 mg by mouth at bedtime.        Allergies:  No Known Allergies  Family History: Family History  Problem Relation Age of Onset   Stroke Mother    Diabetes Mother    Lung cancer Father    Diabetes Father     Social History:   reports that he has been smoking cigarettes. He has a 26.5 pack-year smoking history. He has never used smokeless tobacco. He reports that he does not currently use drugs after having used the following drugs: Heroin, Cocaine, and Marijuana. He reports that he does not drink alcohol.  Physical Exam: BP 130/72   Pulse (!) 106   Ht 5\' 3"  (1.6 m)   Wt 135 lb (61.2 kg)   BMI 23.91 kg/m   Constitutional:  Alert and oriented, no acute distress, nontoxic appearing HEENT: [place], AT Cardiovascular: No clubbing, cyanosis, or  edema Respiratory: Normal respiratory effort, no increased work of breathing Skin: No rashes, bruises or suspicious lesions Neurologic: Grossly intact, no focal deficits, moving all 4 extremities Psychiatric: Normal mood and affect  Laboratory Data: Results for orders placed or performed in visit on 12/29/23  Microscopic Examination   Collection Time: 12/29/23  1:03 PM   Urine  Result Value Ref Range   WBC, UA 0-5 0 - 5 /hpf   RBC, Urine 11-30 (A) 0 - 2 /hpf   Epithelial Cells (non renal) 0-10 0 - 10 /hpf   Casts Present (A) None seen /lpf   Cast Type Hyaline casts N/A   Bacteria, UA Few None seen/Few  Urinalysis, Complete   Collection Time: 12/29/23  1:03 PM  Result Value Ref Range   Specific Gravity, UA 1.015 1.005 - 1.030   pH, UA 7.0 5.0 - 7.5   Color, UA Yellow Yellow   Appearance Ur Clear Clear   Leukocytes,UA Negative Negative   Protein,UA 1+ (A) Negative/Trace   Glucose, UA 3+ (A) Negative   Ketones, UA Negative Negative   RBC, UA Trace (A) Negative   Bilirubin, UA Negative Negative   Urobilinogen, Ur 0.2 0.2 - 1.0 mg/dL   Nitrite, UA Negative Negative   Microscopic Examination See below:   Bladder Scan (Post Void Residual) in office   Collection Time: 12/29/23  1:10 PM  Result Value Ref Range   Scan Result 98 ml    Assessment & Plan:   1. Dysuria (Primary) 2 months of dysuria and passage of dark brown threadlike material in the urine, possibly representing gross hematuria/clots.  He has not had any surveillance cystoscopies since his nephro ureterectomy last year.  UA is bland, low suspicion for UTI.  He does have persistent microscopic hematuria.  Will send for urine cytology and get him set up for cystoscopy with Dr. Lonna Cobb.  I am also treating his dysuria with Pyridium. - Urinalysis, Complete - phenazopyridine (PYRIDIUM) 200 MG tablet; Take 1 tablet (200 mg total) by mouth 3 (three) times daily as needed for pain. Do not take more than 3 days continuously.   Dispense: 30 tablet; Refill: 11  2. Urge incontinence Gemtesa for chronic urge incontinence.  He is emptying appropriately today. - Bladder Scan (Post Void Residual) in office - Vibegron (GEMTESA) 75 MG TABS; Take 1 tablet (75 mg total)  by mouth daily.   Return in about 2 weeks (around 01/12/2024) for Cysto with Dr. Lonna Cobb.  Carman Ching, PA-C  [facility] [address], [address] [place], [place] [address] [phone]

## 2023-12-29 NOTE — Patient Instructions (Signed)

## 2023-12-29 NOTE — Progress Notes (Signed)
 12/29/2023 1:14 PM   [name] 1946-10-06 [id number]  CC: Chief Complaint  Patient presents with   Urinary Frequency   HPI: [name] is a 78 y.o. male with PMH high-grade right upper tract urothelial carcinoma s/p right nephro ureterectomy with Dr. Laural Benes at [facility] in February 2024 with postop bladder leak, chronic right low back pain, nephrolithiasis, BPH with LUTS s/p UroLift in 2021, bipolar 1 disorder with anxiety, and polysubstance abuse who presents today for evaluation of possible worms in his urine.  He is accompanied today by his wife.  Today he reports about 2 months of dysuria and the appearance of something wormlike in his urine.  He describes them as dark brown and thready in appearance.  He sees them about once per week.  He also has a chronic history of urge incontinence and wears Depends.  In-office UA today positive for 3+ glucose, trace intact blood, and 1+ protein; urine microscopy with 11-30 RBCs/HPF. PVR 98mL.  PMH: Past Medical History:  Diagnosis Date   Acute right PCA stroke (HCC) 03/12/2018   a.) residual loss of peripheral vision and balance issues   Anemia    Anxiety    Bilateral carotid artery stenosis    Cerebral atherosclerosis 03/13/2018   a.) CTA neck 03/13/2018: proximal inferior division RIGHT M2 segment stenosis with normal distal opacification; mod stenosis of the distal RIGHT P2 segment (likely culprit of 03/12/2018 CVA); diffuse small vessel Dz   Cervical spondylosis    Chronic back pain    a.) followed by pain mgmt at [facility]   DDD (degenerative disc disease), lumbar    Depression    GERD (gastroesophageal reflux disease)    HCV (hepatitis C virus)    a.) s/p Tx with ledipasvir/sofosbuvir   History of kidney stones    Hypertension    Leg ulcer, left (HCC) 2023   has been seen at the wound clinic   Lesion of BILATERAL parotid glands 03/13/2018   a.) CTA neck 03/13/2018: RIGHT (1.3 x 1.8 x 2.4 cm) and LEFT (1.5 x 0.9 1.2 cm); b.)  CT soft tissue neck 04/05/2021: RIGHT (2.3 x 1.6 cm) and LEFT (2.3 x 1.6 cm)   Long term current use of antithrombotics/antiplatelets    a.) daily DAPT therapy (ASA + clopidogrel)   Overdose of benzodiazepine    Overuse of medication 02/07/2022   a.) admitted to [facility] with decreased LOC secondary to overuse of "Delta 8 THC gummies"   Peripheral vascular disease (HCC)    Polysubstance abuse (HCC)    a.) marijuana + cocaine + heroin + BZO   Pre-diabetes    Right-sided Bell's palsy    Unresponsive episode 02/07/2022   Unsteady gait    a.) following CVA in 2019; uses ambulation aid (walker) for balance   Wears dentures    Has full upper.  Does not wear.    Surgical History: Past Surgical History:  Procedure Laterality Date   ANTERIOR LUMBAR FUSION  05/13/2012   Procedure: ANTERIOR LUMBAR FUSION 1 LEVEL;  Surgeon: Venita Lick, MD;  Location: [facility];  Service: Orthopedics;  Laterality: N/A;  ALIF L5-S1   CATARACT EXTRACTION W/PHACO Left 01/01/2021   Procedure: CATARACT EXTRACTION PHACO AND INTRAOCULAR LENS PLACEMENT (IOC) LEFT DIABETIC 2.28 00:24.7;  Surgeon: Nevada Crane, MD;  Location: [facility];  Service: Ophthalmology;  Laterality: Left;  covid + 11-30-20   CATARACT EXTRACTION W/PHACO Right 01/15/2021   Procedure: CATARACT EXTRACTION PHACO AND INTRAOCULAR LENS PLACEMENT (IOC) RIGHT  DIABETIC;  Surgeon: Nevada Crane, MD;  Location: [facility];  Service: Ophthalmology;  Laterality: Right;  2.62 0:34.5   CHOLECYSTECTOMY     COLONOSCOPY WITH PROPOFOL N/A 09/19/2021   Procedure: COLONOSCOPY WITH PROPOFOL;  Surgeon: Toledo, Boykin Nearing, MD;  Location: [facility];  Service: Gastroenterology;  Laterality: N/A;   CYSTOSCOPY W/ RETROGRADES Left 08/04/2018   Procedure: CYSTOSCOPY WITH RETROGRADE PYELOGRAM;  Surgeon: Riki Altes, MD;  Location: [facility];  Service: Urology;  Laterality: Left;   CYSTOSCOPY W/ URETERAL STENT PLACEMENT Right 07/30/2022   Procedure:  CYSTOSCOPY WITH URETERAL STENT REPLACEMENT;  Surgeon: Riki Altes, MD;  Location: [facility];  Service: Urology;  Laterality: Right;   CYSTOSCOPY WITH INSERTION OF UROLIFT N/A 01/18/2020   Procedure: CYSTOSCOPY WITH INSERTION OF UROLIFT;  Surgeon: Riki Altes, MD;  Location: [facility];  Service: Urology;  Laterality: N/A;   CYSTOSCOPY WITH RETROGRADE PYELOGRAM, URETEROSCOPY AND STENT PLACEMENT Left 06/18/2022   Procedure: CYSTOSCOPY WITH RETROGRADE PYELOGRAM;  Surgeon: Riki Altes, MD;  Location: [facility];  Service: Urology;  Laterality: Left;   CYSTOSCOPY WITH STENT PLACEMENT Left 08/04/2018   Procedure: CYSTOSCOPY WITH STENT PLACEMENT;  Surgeon: Riki Altes, MD;  Location: [facility];  Service: Urology;  Laterality: Left;   CYSTOSCOPY WITH URETEROSCOPY AND STENT PLACEMENT Right 06/18/2022   Procedure: CYSTOSCOPY WITH URETEROSCOPY AND STENT PLACEMENT;  Surgeon: Riki Altes, MD;  Location: [facility];  Service: Urology;  Laterality: Right;   EXTRACORPOREAL SHOCK WAVE LITHOTRIPSY  2012   EXTRACORPOREAL SHOCK WAVE LITHOTRIPSY Left 08/20/2018   Procedure: EXTRACORPOREAL SHOCK WAVE LITHOTRIPSY (ESWL);  Surgeon: Vanna Scotland, MD;  Location: [facility];  Service: Urology;  Laterality: Left; (cancelled)   EXTRACORPOREAL SHOCK WAVE LITHOTRIPSY Left 08/27/2018   Procedure: EXTRACORPOREAL SHOCK WAVE LITHOTRIPSY (ESWL);  Surgeon: Riki Altes, MD;  Location: [facility];  Service: Urology;  Laterality: Left;   EXTRACORPOREAL SHOCK WAVE LITHOTRIPSY Right 10/15/2018   Procedure: EXTRACORPOREAL SHOCK WAVE LITHOTRIPSY (ESWL);  Surgeon: Sondra Come, MD;  Location: [facility];  Service: Urology;  Laterality: Right;   KIDNEY STONE SURGERY     URETERAL BIOPSY Right 07/30/2022   Procedure: URETERAL BIOPSY OF RENAL PELVIS;  Surgeon: Riki Altes, MD;  Location: [facility];  Service: Urology;  Laterality: Right;   URETEROSCOPY Right 07/30/2022   Procedure: URETEROSCOPY;  Surgeon: Riki Altes, MD;  Location: [facility];  Service: Urology;  Laterality: Right;    Home Medications:  Allergies as of 12/29/2023   No Known Allergies      Medication List        Accurate as of December 29, 2023  1:14 PM. If you have any questions, ask your nurse or doctor.          amLODipine 2.5 MG tablet Commonly known as: NORVASC Take 2.5 mg by mouth every morning.   aspirin EC 81 MG tablet Take 1 tablet (81 mg total) by mouth daily. Swallow whole.   atorvastatin 40 MG tablet Commonly known as: LIPITOR Take 40 mg by mouth every morning.   clopidogrel 75 MG tablet Commonly known as: PLAVIX Take 75 mg by mouth daily.   escitalopram 10 MG tablet Commonly known as: LEXAPRO Take 10 mg by mouth every morning.   folic acid 1 MG tablet Commonly known as: FOLVITE Take 1 tablet (1 mg total) by mouth daily.   furosemide 20 MG tablet Commonly known as: LASIX Take 20 mg by mouth daily.   Jardiance 25  MG Tabs tablet Generic drug: empagliflozin Take 25 mg by mouth daily.   LORazepam 0.5 MG tablet Commonly known as: ATIVAN Take 0.5 mg by mouth daily as needed.   QUEtiapine 300 MG tablet Commonly known as: SEROQUEL Take 300 mg by mouth at bedtime.        Allergies:  No Known Allergies  Family History: Family History  Problem Relation Age of Onset   Stroke Mother    Diabetes Mother    Lung cancer Father    Diabetes Father     Social History:   reports that he has been smoking cigarettes. He has a 26.5 pack-year smoking history. He has never used smokeless tobacco. He reports that he does not currently use drugs after having used the following drugs: Heroin, Cocaine, and Marijuana. He reports that he does not drink alcohol.  Physical Exam: BP 130/72   Pulse (!) 106   Ht 5\' 3"  (1.6 m)   Wt 135 lb (61.2 kg)   BMI 23.91 kg/m   Constitutional:  Alert and oriented, no acute distress, nontoxic appearing HEENT: [place], AT Cardiovascular: No clubbing, cyanosis, or  edema Respiratory: Normal respiratory effort, no increased work of breathing Skin: No rashes, bruises or suspicious lesions Neurologic: Grossly intact, no focal deficits, moving all 4 extremities Psychiatric: Normal mood and affect  Laboratory Data: Results for orders placed or performed in visit on 12/29/23  Microscopic Examination   Collection Time: 12/29/23  1:03 PM   Urine  Result Value Ref Range   WBC, UA 0-5 0 - 5 /hpf   RBC, Urine 11-30 (A) 0 - 2 /hpf   Epithelial Cells (non renal) 0-10 0 - 10 /hpf   Casts Present (A) None seen /lpf   Cast Type Hyaline casts N/A   Bacteria, UA Few None seen/Few  Urinalysis, Complete   Collection Time: 12/29/23  1:03 PM  Result Value Ref Range   Specific Gravity, UA 1.015 1.005 - 1.030   pH, UA 7.0 5.0 - 7.5   Color, UA Yellow Yellow   Appearance Ur Clear Clear   Leukocytes,UA Negative Negative   Protein,UA 1+ (A) Negative/Trace   Glucose, UA 3+ (A) Negative   Ketones, UA Negative Negative   RBC, UA Trace (A) Negative   Bilirubin, UA Negative Negative   Urobilinogen, Ur 0.2 0.2 - 1.0 mg/dL   Nitrite, UA Negative Negative   Microscopic Examination See below:   Bladder Scan (Post Void Residual) in office   Collection Time: 12/29/23  1:10 PM  Result Value Ref Range   Scan Result 98 ml    Assessment & Plan:   1. Dysuria (Primary) 2 months of dysuria and passage of dark brown threadlike material in the urine, possibly representing gross hematuria/clots.  He has not had any surveillance cystoscopies since his nephro ureterectomy last year.  UA is bland, low suspicion for UTI.  He does have persistent microscopic hematuria.  Will send for urine cytology and get him set up for cystoscopy with Dr. Lonna Cobb.  I am also treating his dysuria with Pyridium. - Urinalysis, Complete - phenazopyridine (PYRIDIUM) 200 MG tablet; Take 1 tablet (200 mg total) by mouth 3 (three) times daily as needed for pain. Do not take more than 3 days continuously.   Dispense: 30 tablet; Refill: 11  2. Urge incontinence Gemtesa for chronic urge incontinence.  He is emptying appropriately today. - Bladder Scan (Post Void Residual) in office - Vibegron (GEMTESA) 75 MG TABS; Take 1 tablet (75 mg total)  by mouth daily.   Return in about 2 weeks (around 01/12/2024) for Cysto with Dr. Lonna Cobb.  Carman Ching, PA-C  [facility] [address], [address] [place], [place] [address] [phone]

## 2023-12-31 ENCOUNTER — Encounter: Payer: Self-pay | Admitting: Family Medicine

## 2023-12-31 ENCOUNTER — Ambulatory Visit: Admitting: Family Medicine

## 2023-12-31 VITALS — BP 123/61 | HR 82 | Ht 63.0 in | Wt 135.0 lb

## 2023-12-31 DIAGNOSIS — N400 Enlarged prostate without lower urinary tract symptoms: Secondary | ICD-10-CM

## 2023-12-31 DIAGNOSIS — M5136 Other intervertebral disc degeneration, lumbar region with discogenic back pain only: Secondary | ICD-10-CM

## 2023-12-31 DIAGNOSIS — M47816 Spondylosis without myelopathy or radiculopathy, lumbar region: Secondary | ICD-10-CM

## 2023-12-31 DIAGNOSIS — I739 Peripheral vascular disease, unspecified: Secondary | ICD-10-CM | POA: Diagnosis not present

## 2023-12-31 DIAGNOSIS — G8929 Other chronic pain: Secondary | ICD-10-CM

## 2023-12-31 DIAGNOSIS — I10 Essential (primary) hypertension: Secondary | ICD-10-CM | POA: Diagnosis not present

## 2023-12-31 DIAGNOSIS — R569 Unspecified convulsions: Secondary | ICD-10-CM

## 2023-12-31 DIAGNOSIS — I6523 Occlusion and stenosis of bilateral carotid arteries: Secondary | ICD-10-CM

## 2023-12-31 DIAGNOSIS — B182 Chronic viral hepatitis C: Secondary | ICD-10-CM

## 2023-12-31 DIAGNOSIS — M51362 Other intervertebral disc degeneration, lumbar region with discogenic back pain and lower extremity pain: Secondary | ICD-10-CM

## 2023-12-31 DIAGNOSIS — R531 Weakness: Secondary | ICD-10-CM

## 2023-12-31 DIAGNOSIS — E1140 Type 2 diabetes mellitus with diabetic neuropathy, unspecified: Secondary | ICD-10-CM | POA: Insufficient documentation

## 2023-12-31 DIAGNOSIS — F411 Generalized anxiety disorder: Secondary | ICD-10-CM

## 2023-12-31 DIAGNOSIS — I6930 Unspecified sequelae of cerebral infarction: Secondary | ICD-10-CM

## 2023-12-31 DIAGNOSIS — M5417 Radiculopathy, lumbosacral region: Secondary | ICD-10-CM

## 2023-12-31 DIAGNOSIS — F32A Depression, unspecified: Secondary | ICD-10-CM

## 2023-12-31 DIAGNOSIS — F3289 Other specified depressive episodes: Secondary | ICD-10-CM

## 2023-12-31 DIAGNOSIS — F17200 Nicotine dependence, unspecified, uncomplicated: Secondary | ICD-10-CM | POA: Insufficient documentation

## 2023-12-31 DIAGNOSIS — R270 Ataxia, unspecified: Secondary | ICD-10-CM

## 2023-12-31 DIAGNOSIS — E782 Mixed hyperlipidemia: Secondary | ICD-10-CM

## 2023-12-31 MED ORDER — GABAPENTIN 100 MG PO TABS: 300.0000 mg | ORAL_TABLET | Freq: Three times a day (TID) | ORAL | 1 refills | Status: DC

## 2023-12-31 NOTE — Patient Instructions (Signed)
 VISIT SUMMARY:  You had an appointment today to establish care as a new patient. We discussed your history of a stroke, diabetes, neuropathy, anxiety, depression, chronic back pain, and hypertension. We also reviewed your medications and made some adjustments to better manage your symptoms.  YOUR PLAN:  -PERIPHERAL NEUROPATHY: Peripheral neuropathy is a condition where you experience pain, burning, and numbness in your feet due to nerve damage. We will increase your gabapentin dosage after checking your kidney function, and refer you to vascular surgery and neurology for further evaluation. Continue taking aspirin 81 mg and Plavix 75 mg daily.  -DIABETES MELLITUS TYPE 2: Diabetes Mellitus Type 2 is a condition where your body does not use insulin properly, leading to high blood sugar levels. Continue taking metformin 500 mg twice daily and Jardiance 25 mg daily. We will check your A1c in one month to monitor your blood sugar control.  -CHRONIC BACK PAIN: Chronic back pain is long-lasting pain in your back, often due to conditions like degenerative disc disease. We will follow up with your pain management referral and consider increasing your gabapentin dosage to help with the pain.  -CEREBROVASCULAR ACCIDENT (CVA): A cerebrovascular accident, or stroke, occurs when blood flow to a part of your brain is interrupted. We will refer you to neurology to evaluate your ongoing symptoms and discuss potential interventions.  -ANXIETY AND DEPRESSION: Anxiety and depression are mental health conditions that affect your mood and overall well-being. We will discontinue Lexapro and continue Celexa 20 mg daily. We will also discuss your Seroquel dosage with a behavioral health provider to improve your sleep.  -HYPERTENSION: Hypertension is high blood pressure, which can lead to serious health problems if not managed. Your current blood pressure is well-controlled. Continue taking amlodipine 2.5 mg  daily.  -GENERAL HEALTH MAINTENANCE: We encourage you to quit smoking to reduce cardiovascular risks and improve your overall health. We will order a yearly chest CT due to your smoking history and schedule a follow-up with an eye specialist.  INSTRUCTIONS:  Please schedule a follow-up appointment in 2-3 weeks to assess your response to the treatment changes and specialist referrals.

## 2023-12-31 NOTE — Assessment & Plan Note (Signed)
 PCA stroke in 2019 with residual peripheral vision loss and balance issues. No current neurologist follow-up. Neurology referral is necessary to evaluate ongoing symptoms and potential interventions. - Refer to neurology for follow-up on stroke-related symptoms

## 2023-12-31 NOTE — Assessment & Plan Note (Signed)
 Chronic back pain with degenerative disc disease and cervical spondylosis. Follows with pain management, but no recent contact. Pain management is reviewing the case for further interventions, which may include procedures rather than medication adjustments. - Follow up with pain management referral - Consider increasing gabapentin dosage for neuropathic pain

## 2023-12-31 NOTE — Assessment & Plan Note (Signed)
 Managed with metformin and Jardiance. Last A1c was 7.1 in January 2025, indicating suboptimal control. Albumin levels slightly elevated, suggesting some kidney involvement. Diabetes management is crucial to prevent further complications, including neuropathy and kidney damage. chronic - Continue metformin 500 mg twice daily - Continue Jardiance 25 mg daily - Check A1c in one month

## 2023-12-31 NOTE — Assessment & Plan Note (Signed)
 Chronic 2/2 to CVA in 2019  Neurology referral submitted  Continue to use walker and cane to assist with ambulation

## 2023-12-31 NOTE — Progress Notes (Signed)
 New patient visit   Patient: [name]   DOB: 07-11-1946   78 y.o. Male  MRN: [id number] Visit Date: 12/31/2023  Today's healthcare provider: Ronnald Ramp, MD   Chief Complaint  Patient presents with   Establish Care    Numbness and tingling in both hand and feet this has been going on for about 2 months last he wasn't able to sleep,    Medication Management    Discuss medications    Subjective    [name] is a 78 y.o. male who presents today as a new patient to establish care.   HPI     Establish Care    Additional comments: Numbness and tingling in both hand and feet this has been going on for about 2 months last he wasn't able to sleep,         Medication Management    Additional comments: Discuss medications       Last edited by Thedora Hinders, CMA on 12/31/2023  9:32 AM.       Discussed the use of AI scribe software for clinical note transcription with the patient, who gave verbal consent to proceed.  History of Present Illness   He is a 78 year old with a history of PCA stroke who presents to establish care as a new patient. He is accompanied by his daughter.  He has a history of a PCA stroke in 2019, resulting in residual loss of peripheral vision and balance issues. He experiences ongoing unsteady gait and uses a walker at home. He also mentions brain fog and dizziness.  He has diabetes, with the last A1c recorded at 7.1 in January 2025. He is currently on metformin 500 mg twice daily and Jardiance 25 mg daily. He reports urinary problems and has had stents placed in his ureter, with a follow-up urology appointment scheduled.  He experiences severe neuropathy symptoms, describing the pain as 'like a toothache' and 'pounding all night,' especially when lying down. He is currently on gabapentin 100 mg three times daily, which does not help. He also uses Advil for pain relief, which is ineffective.  He has a history of anxiety and  depression, for which he is on Celexa 20 mg daily. He was previously prescribed Lexapro 10 mg daily but is unsure if he is taking both medications. These medications do not alleviate his symptoms, and he mentions that 'the only thing that helped' was a medication he is no longer prescribed.  He has chronic back pain and follows with pain management at [facility]. He has not heard back regarding a pain management appointment. He is on Seroquel 300 mg at bedtime, which helps with sleep, although he mentions that his dose was reduced to 200 mg, affecting his sleep quality.  He has hypertension, managed with amlodipine 2.5 mg daily. He also has cerebral atherosclerosis and is on aspirin 81 mg and Plavix 75 mg daily. He reports not taking aspirin regularly.  He smokes about half a pack every three days and has a past history of cocaine, heroin, and marijuana use in the 1960s.  He reports a history of kidney stones, cervical spondylosis, degenerative disc disease, GERD, hepatitis C (treated), and a left lung ulcer. He has undergone several surgical procedures, including anterior lumbar fusion, cataract extraction, cholecystectomy, colonoscopy, cystoscopy, ureteral stent placement, and Urolift procedure.          Past Medical History:  Diagnosis Date   Acute right PCA stroke (HCC) 03/12/2018  a.) residual loss of peripheral vision and balance issues   Anemia    Anxiety    Bilateral carotid artery stenosis    Cerebral atherosclerosis 03/13/2018   a.) CTA neck 03/13/2018: proximal inferior division RIGHT M2 segment stenosis with normal distal opacification; mod stenosis of the distal RIGHT P2 segment (likely culprit of 03/12/2018 CVA); diffuse small vessel Dz   Cervical spondylosis    Chronic back pain    a.) followed by pain mgmt at [facility]   DDD (degenerative disc disease), lumbar    Depression    GERD (gastroesophageal reflux disease)    HCV (hepatitis C virus)    a.) s/p Tx with  ledipasvir/sofosbuvir   History of kidney stones    Hypertension    Leg ulcer, left (HCC) 2023   has been seen at the wound clinic   Lesion of BILATERAL parotid glands 03/13/2018   a.) CTA neck 03/13/2018: RIGHT (1.3 x 1.8 x 2.4 cm) and LEFT (1.5 x 0.9 1.2 cm); b.) CT soft tissue neck 04/05/2021: RIGHT (2.3 x 1.6 cm) and LEFT (2.3 x 1.6 cm)   Long term current use of antithrombotics/antiplatelets    a.) daily DAPT therapy (ASA + clopidogrel)   Overdose of benzodiazepine    Overuse of medication 02/07/2022   a.) admitted to [facility] with decreased LOC secondary to overuse of "Delta 8 THC gummies"   Peripheral vascular disease (HCC)    Polysubstance abuse (HCC)    a.) marijuana + cocaine + heroin + BZO   Pre-diabetes    Right-sided Bell's palsy    Unresponsive episode 02/07/2022   Unsteady gait    a.) following CVA in 2019; uses ambulation aid (walker) for balance   Wears dentures    Has full upper.  Does not wear.    Outpatient Medications Prior to Visit  Medication Sig   amLODipine (NORVASC) 2.5 MG tablet Take 2.5 mg by mouth every morning.   aspirin EC 81 MG EC tablet Take 1 tablet (81 mg total) by mouth daily. Swallow whole.   atorvastatin (LIPITOR) 40 MG tablet Take 40 mg by mouth every morning.   citalopram (CELEXA) 20 MG tablet Take 20 mg by mouth daily.   clopidogrel (PLAVIX) 75 MG tablet Take 75 mg by mouth daily.   folic acid (FOLVITE) 1 MG tablet Take 1 tablet (1 mg total) by mouth daily.   furosemide (LASIX) 20 MG tablet Take 20 mg by mouth daily.   JARDIANCE 25 MG TABS tablet Take 25 mg by mouth daily.   metFORMIN (GLUMETZA) 500 MG (MOD) 24 hr tablet Take 500 mg by mouth 2 (two) times daily.   phenazopyridine (PYRIDIUM) 200 MG tablet Take 1 tablet (200 mg total) by mouth 3 (three) times daily as needed for pain. Do not take more than 3 days continuously.   QUEtiapine (SEROQUEL) 300 MG tablet Take 300 mg by mouth at bedtime.   Vibegron (GEMTESA) 75 MG TABS Take 1 tablet (75  mg total) by mouth daily.   [DISCONTINUED] escitalopram (LEXAPRO) 10 MG tablet Take 10 mg by mouth every morning.   [DISCONTINUED] gabapentin (NEURONTIN) 100 MG tablet Take 100 mg by mouth 3 (three) times daily.   LORazepam (ATIVAN) 0.5 MG tablet Take 0.5 mg by mouth daily as needed. (Patient not taking: Reported on 12/31/2023)   No facility-administered medications prior to visit.    Past Surgical History:  Procedure Laterality Date   ANTERIOR LUMBAR FUSION  05/13/2012   Procedure: ANTERIOR LUMBAR FUSION 1 LEVEL;  Surgeon: Venita Lick, MD;  Location: [facility] OR;  Service: Orthopedics;  Laterality: N/A;  ALIF L5-S1   CATARACT EXTRACTION W/PHACO Left 01/01/2021   Procedure: CATARACT EXTRACTION PHACO AND INTRAOCULAR LENS PLACEMENT (IOC) LEFT DIABETIC 2.28 00:24.7;  Surgeon: Nevada Crane, MD;  Location: [facility];  Service: Ophthalmology;  Laterality: Left;  covid + 11-30-20   CATARACT EXTRACTION W/PHACO Right 01/15/2021   Procedure: CATARACT EXTRACTION PHACO AND INTRAOCULAR LENS PLACEMENT (IOC) RIGHT DIABETIC;  Surgeon: Nevada Crane, MD;  Location: [facility];  Service: Ophthalmology;  Laterality: Right;  2.62 0:34.5   CHOLECYSTECTOMY     COLONOSCOPY WITH PROPOFOL N/A 09/19/2021   Procedure: COLONOSCOPY WITH PROPOFOL;  Surgeon: Toledo, Boykin Nearing, MD;  Location: [facility];  Service: Gastroenterology;  Laterality: N/A;   CYSTOSCOPY W/ RETROGRADES Left 08/04/2018   Procedure: CYSTOSCOPY WITH RETROGRADE PYELOGRAM;  Surgeon: Riki Altes, MD;  Location: [facility] ORS;  Service: Urology;  Laterality: Left;   CYSTOSCOPY W/ URETERAL STENT PLACEMENT Right 07/30/2022   Procedure: CYSTOSCOPY WITH URETERAL STENT REPLACEMENT;  Surgeon: Riki Altes, MD;  Location: [facility] ORS;  Service: Urology;  Laterality: Right;   CYSTOSCOPY WITH INSERTION OF UROLIFT N/A 01/18/2020   Procedure: CYSTOSCOPY WITH INSERTION OF UROLIFT;  Surgeon: Riki Altes, MD;  Location: [facility] ORS;   Service: Urology;  Laterality: N/A;   CYSTOSCOPY WITH RETROGRADE PYELOGRAM, URETEROSCOPY AND STENT PLACEMENT Left 06/18/2022   Procedure: CYSTOSCOPY WITH RETROGRADE PYELOGRAM;  Surgeon: Riki Altes, MD;  Location: [facility] ORS;  Service: Urology;  Laterality: Left;   CYSTOSCOPY WITH STENT PLACEMENT Left 08/04/2018   Procedure: CYSTOSCOPY WITH STENT PLACEMENT;  Surgeon: Riki Altes, MD;  Location: [facility] ORS;  Service: Urology;  Laterality: Left;   CYSTOSCOPY WITH URETEROSCOPY AND STENT PLACEMENT Right 06/18/2022   Procedure: CYSTOSCOPY WITH URETEROSCOPY AND STENT PLACEMENT;  Surgeon: Riki Altes, MD;  Location: [facility] OR[facility];  Service: Urology;  Laterality: Right;   EXTRACORPOREAL SHOCK WAVE LITHOTRIPSY  2012   EXTRACORPOREAL SHOCK WAVE LITHOTRIPSY Left 08/20/2018   Procedure: EXTRACORPOREAL SHOCK WAVE LITHOTRIPSY (ESWL);  Surgeon: Vanna Scotland, MD;  Location: [facility] ORS;  Service: Urology;  Laterality: Left; (cancelled)   EXTRACORPOREAL SHOCK WAVE LITHOTRIPSY Left 08/27/2018   Procedure: EXTRACORPOREAL SHOCK WAVE LITHOTRIPSY (ESWL);  Surgeon: Riki Altes, MD;  Location: [facility] ORS;  Service: Urology;  Laterality: Left;   EXTRACORPOREAL SHOCK WAVE LITHOTRIPSY Right 10/15/2018   Procedure: EXTRACORPOREAL SHOCK WAVE LITHOTRIPSY (ESWL);  Surgeon: Sondra Come, MD;  Location: [facility] ORS;  Service: Urology;  Laterality: Right;   KIDNEY STONE SURGERY     URETERAL BIOPSY Right 07/30/2022   Procedure: URETERAL BIOPSY OF RENAL PELVIS;  Surgeon: Riki Altes, MD;  Location: [facility] OR[facility];  Service: Urology;  Laterality: Right;   URETEROSCOPY Right 07/30/2022   Procedure: URETEROSCOPY;  Surgeon: Riki Altes, MD;  Location: [facility] ORS;  Service: Urology;  Laterality: Right;   Family Status  Relation Name Status   Mother  Deceased   Father  Deceased   Sister  Alive   Brother  Alive   Sister  Deceased  No partnership data on file   Family History  Problem Relation Age of Onset   Stroke  Mother    Diabetes Mother    Lung cancer Father    Diabetes Father    Social History   Socioeconomic History   Marital status: Divorced    Spouse name: Not on file   Number of children: 2   Years of  education: Not on file   Highest education level: Associate degree: occupational, technical, or vocational program  Occupational History   Not on file  Tobacco Use   Smoking status: Every Day    Current packs/day: 0.50    Average packs/day: 0.5 packs/day for 53.0 years (26.5 ttl pk-yrs)    Types: Cigarettes   Smokeless tobacco: Never  Vaping Use   Vaping status: Never Used  Substance and Sexual Activity   Alcohol use: Never    Alcohol/week: 0.0 standard drinks of alcohol   Drug use: Not Currently    Types: Heroin, Cocaine, Marijuana    Comment: 1960's   Sexual activity: Yes  Other Topics Concern   Not on file  Social History Narrative   ** Merged History Encounter **       Social Drivers of Health   Financial Resource Strain: Patient Declined (12/09/2023)   Received from [facility]   Overall Financial Resource Strain (CARDIA)    Difficulty of Paying Living Expenses: Patient declined  Food Insecurity: Patient Declined (12/09/2023)   Received from [facility]   Hunger Vital Sign    Worried About Running Out of Food in the Last Year: Patient declined    Ran Out of Food in the Last Year: Patient declined  Transportation Needs: Patient Declined (12/09/2023)   Received from [facility] - Transportation    In the past 12 months, has lack of transportation kept you from medical appointments or from getting medications?: Patient declined    Lack of Transportation (Non-Medical): Patient declined  Physical Activity: Inactive (02/16/2019)   Exercise Vital Sign    Days of Exercise per Week: 0 days    Minutes of Exercise per Session: 0 min  Stress: No Stress Concern Present (10/18/2020)   Received from [facility]  [facility] - Occupational Stress Questionnaire    Feeling of Stress : Not at all  Social Connections: Unknown (02/28/2022)   Received from [facility]   Social Network    Social Network: Not on file     No Known Allergies  Immunization History  Administered Date(s) Administered   Influenza Split 07/26/2014   Influenza, High Dose Seasonal PF 07/23/2017, 06/26/2018, 09/24/2021   Influenza-Unspecified 07/30/2015, 08/05/2016   PFIZER(Purple Top)SARS-COV-2 Vaccination 08/18/2020   Pneumococcal Conjugate-13 07/30/2015   Pneumococcal Polysaccharide-23 08/25/2017    Health Maintenance  Topic Date Due   FOOT EXAM  Never done   OPHTHALMOLOGY EXAM  Never done   Diabetic kidney evaluation - Urine ACR  Never done   Zoster Vaccines- Shingrix (1 of 2) Never done   COVID-19 Vaccine (2 - Pfizer risk series) 09/08/2020   Medicare Annual Wellness (AWV)  01/22/2022   HEMOGLOBIN A1C  08/10/2022   INFLUENZA VACCINE  05/22/2023   Diabetic kidney evaluation - eGFR measurement  09/01/2023   Lung Cancer Screening  09/18/2023   Pneumonia Vaccine 2+ Years old  Completed   Hepatitis C Screening  Completed   HPV VACCINES  Aged Out   DTaP/Tdap/Td  Discontinued   Colonoscopy  Discontinued    Patient Care Team: Ronnald Ramp, MD as PCP - General (Family Medicine) Ancil Boozer, MD (Inactive) (Family Medicine)  Review of Systems  Last CBC Lab Results  Component Value Date   WBC 11.0 (H) 08/31/2022   HGB 15.2 08/31/2022   HCT 45.9 08/31/2022   MCV 89.5 08/31/2022   MCH 29.6 08/31/2022  RDW 13.8 08/31/2022   PLT 231 08/31/2022   Last metabolic panel Lab Results  Component Value Date   GLUCOSE 155 (H) 08/31/2022   NA 137 08/31/2022   K 3.3 (L) 08/31/2022   CL 93 (L) 08/31/2022   CO2 32 08/31/2022   BUN 25 (H) 08/31/2022   CREATININE 1.14 08/31/2022   GFRNONAA >60 08/31/2022   CALCIUM 10.3 08/31/2022   PHOS  3.2 10/10/2020   PROT 8.6 (H) 08/31/2022   ALBUMIN 4.4 08/31/2022   BILITOT 0.6 08/31/2022   ALKPHOS 85 08/31/2022   AST 22 08/31/2022   ALT 21 08/31/2022   ANIONGAP 12 08/31/2022   Last lipids Lab Results  Component Value Date   CHOL 200 03/13/2018   HDL 20 (L) 03/13/2018   LDLCALC 131 (H) 03/13/2018   TRIG 247 (H) 03/13/2018   CHOLHDL 10.0 03/13/2018   Last hemoglobin A1c Lab Results  Component Value Date   HGBA1C 6.7 (H) 02/08/2022   Last thyroid functions Lab Results  Component Value Date   TSH 4.589 (H) 10/14/2020   Last vitamin D No results found for: "25OHVITD2", "25OHVITD3", "VD25OH" Last vitamin B12 and Folate Lab Results  Component Value Date   VITAMINB12 293 10/14/2020        Objective    BP 123/61   Pulse 82   Ht 5\' 3"  (1.6 m)   Wt 135 lb (61.2 kg)   SpO2 99%   BMI 23.91 kg/m  BP Readings from Last 3 Encounters:  12/31/23 123/61  12/29/23 130/72  07/09/23 126/84   Wt Readings from Last 3 Encounters:  12/31/23 135 lb (61.2 kg)  12/29/23 135 lb (61.2 kg)  09/02/23 142 lb (64.4 kg)        Depression Screen    12/31/2023    9:38 AM 02/26/2016    9:46 AM 01/11/2016    8:06 AM  PHQ 2/9 Scores  PHQ - 2 Score 4 0 2  PHQ- 9 Score 12  6   No results found for any visits on 12/31/23.   Physical Exam Vitals reviewed.  Constitutional:      General: He is not in acute distress.    Appearance: Normal appearance. He is not ill-appearing, toxic-appearing or diaphoretic.     Comments: Elderly male, chronically ill appearing, ambulates slowly with cane and assistance of daughter   Eyes:     Conjunctiva/sclera: Conjunctivae normal.  Cardiovascular:     Rate and Rhythm: Normal rate and regular rhythm.     Pulses: Normal pulses.          Posterior tibial pulses are 2+ on the right side and 2+ on the left side.     Heart sounds: Normal heart sounds. No murmur heard.    No friction rub. No gallop.  Pulmonary:     Effort: Pulmonary effort is  normal. No respiratory distress.     Breath sounds: Normal breath sounds. No stridor. No wheezing, rhonchi or rales.  Abdominal:     General: Bowel sounds are normal. There is no distension.     Palpations: Abdomen is soft.     Tenderness: There is no abdominal tenderness.  Musculoskeletal:     Right lower leg: No edema.     Left lower leg: No edema.  Skin:    Findings: No erythema or rash.  Neurological:     Mental Status: He is alert and oriented to person, place, and time.  Psychiatric:        Mood and  Affect: Mood and affect normal.        Speech: Speech normal.        Behavior: Behavior normal. Behavior is cooperative.       Assessment & Plan      Problem List Items Addressed This Visit       Cardiovascular and Mediastinum   PAD (peripheral artery disease) (HCC) - Primary   Severe neuropathy symptoms, particularly in the feet, causing significant pain and sleep disturbances. Current gabapentin 100 mg three times daily is ineffective. Symptoms include burning, numbness, and toothache-like pain, especially at night. Possible mixed picture with circulation issues and diabetes contributing to symptoms. Gabapentin is considered a workhorse for nerve pain, but the current dose may be insufficient. Alternatives like Lyrica and Cymbalta were discussed but not preferred due to side effects and overlap with current medications. Chronic          - Increase gabapentin dosage, checking kidney function first - Refer to vascular surgery for circulation issues - Refer to neurology for further evaluation - Continue aspirin 81 mg and Plavix 75 mg daily      Relevant Orders   Ambulatory referral to Vascular Surgery   Essential hypertension   Chronic hypertension managed with amlodipine. Current blood pressure is 123/61, indicating good control. - Continue amlodipine 2.5 mg daily      Bilateral carotid artery stenosis     Digestive   Chronic hepatitis C without hepatic coma (HCC)      Endocrine   Type 2 diabetes mellitus with diabetic neuropathy, without long-term current use of insulin (HCC)   Managed with metformin and Jardiance. Last A1c was 7.1 in January 2025, indicating suboptimal control. Albumin levels slightly elevated, suggesting some kidney involvement. Diabetes management is crucial to prevent further complications, including neuropathy and kidney damage. chronic - Continue metformin 500 mg twice daily - Continue Jardiance 25 mg daily - Check A1c in one month      Relevant Medications   metFORMIN (GLUMETZA) 500 MG (MOD) 24 hr tablet   gabapentin (NEURONTIN) 100 MG tablet     Nervous and Auditory   Lumbosacral radiculitis (Chronic)   Relevant Medications   citalopram (CELEXA) 20 MG tablet   gabapentin (NEURONTIN) 100 MG tablet     Musculoskeletal and Integument   Lumbar spondylosis (Chronic)   Discogenic low back pain   Chronic back pain with degenerative disc disease and cervical spondylosis. Follows with pain management, but no recent contact. Pain management is reviewing the case for further interventions, which may include procedures rather than medication adjustments. - Follow up with pain management referral - Consider increasing gabapentin dosage for neuropathic pain      DDD (degenerative disc disease), lumbar     Genitourinary   Benign prostatic hyperplasia without lower urinary tract symptoms     Other   Weakness   Chronic 2/2 to CVA in 2019  Neurology referral submitted  Continue to use walker and cane to assist with ambulation        Tobacco dependence   Relevant Orders   Ambulatory Referral for Lung Cancer Scre   Seizure-like activity [facility])   Reported hx of seizures No current AED prescribed  Nurology referral submitted       Hyperlipidemia   History of ischemic cerebrovascular accident (CVA) with residual deficit   PCA stroke in 2019 with residual peripheral vision loss and balance issues. No current neurologist  follow-up. Neurology referral is necessary to evaluate ongoing symptoms and potential interventions. - Refer  to neurology for follow-up on stroke-related symptoms      Relevant Orders   Ambulatory referral to Neurology   Generalized anxiety disorder   Relevant Medications   citalopram (CELEXA) 20 MG tablet   Clinical depression   Chronic  Currently on Celexa 20 mg daily. Lexapro was previously prescribed but is not being taken. Seroquel is used for sleep but was reduced from 300 mg to 200 mg, which may have affected sleep quality. Higher doses of Seroquel improved sleep but increased fall risk. Behavioral health follow-up is necessary to adjust medications appropriately. - Discontinue Lexapro - Continue Celexa 20 mg daily - Discuss Seroquel dosage with behavioral health provider      Relevant Medications   citalopram (CELEXA) 20 MG tablet   Chronic pain (Chronic)   Chronic  Previously referred to pain management, awaiting appt scheduling       Relevant Medications   citalopram (CELEXA) 20 MG tablet   gabapentin (NEURONTIN) 100 MG tablet   Ataxia   Relevant Orders   Ambulatory referral to Neurology   Anxiety and depression   Relevant Medications   citalopram (CELEXA) 20 MG tablet      General Health Maintenance Smoker with a 53-year history. No recent eye specialist visit despite diabetes history. Smoking cessation is encouraged to reduce cardiovascular risks and improve overall health. - Order yearly chest CT due to smoking history - Encourage smoking cessation - Schedule follow-up with eye specialist  Follow-up Follow-up needed to assess response to treatment changes and specialist referrals. Pain management and specialist referrals are pending, and  follow-up will ensure coordination of care. - Schedule follow-up appointment in 2-3 weeks to assess pain management and specialist referrals          Return in about 3 weeks (around 01/21/2024) for CHRONIC F/U,  neuropathy.      Ronnald Ramp, MD  [facility] [phone] (phone) [phone] (fax)  [facility]

## 2023-12-31 NOTE — Assessment & Plan Note (Signed)
 Chronic hypertension managed with amlodipine. Current blood pressure is 123/61, indicating good control. - Continue amlodipine 2.5 mg daily

## 2023-12-31 NOTE — Assessment & Plan Note (Signed)
 Reported hx of seizures No current AED prescribed  Nurology referral submitted

## 2023-12-31 NOTE — Assessment & Plan Note (Signed)
 Chronic  Previously referred to pain management, awaiting appt scheduling

## 2023-12-31 NOTE — Assessment & Plan Note (Signed)
 Chronic  Currently on Celexa 20 mg daily. Lexapro was previously prescribed but is not being taken. Seroquel is used for sleep but was reduced from 300 mg to 200 mg, which may have affected sleep quality. Higher doses of Seroquel improved sleep but increased fall risk. Behavioral health follow-up is necessary to adjust medications appropriately. - Discontinue Lexapro - Continue Celexa 20 mg daily - Discuss Seroquel dosage with behavioral health provider

## 2023-12-31 NOTE — Assessment & Plan Note (Signed)
 Severe neuropathy symptoms, particularly in the feet, causing significant pain and sleep disturbances. Current gabapentin 100 mg three times daily is ineffective. Symptoms include burning, numbness, and toothache-like pain, especially at night. Possible mixed picture with circulation issues and diabetes contributing to symptoms. Gabapentin is considered a workhorse for nerve pain, but the current dose may be insufficient. Alternatives like Lyrica and Cymbalta were discussed but not preferred due to side effects and overlap with current medications. Chronic          - Increase gabapentin dosage, checking kidney function first - Refer to vascular surgery for circulation issues - Refer to neurology for further evaluation - Continue aspirin 81 mg and Plavix 75 mg daily

## 2024-01-01 ENCOUNTER — Ambulatory Visit: Payer: Self-pay | Admitting: Family Medicine

## 2024-01-01 ENCOUNTER — Telehealth: Payer: Self-pay

## 2024-01-01 DIAGNOSIS — Z8553 Personal history of malignant neoplasm of renal pelvis: Secondary | ICD-10-CM

## 2024-01-01 DIAGNOSIS — R310 Gross hematuria: Secondary | ICD-10-CM

## 2024-01-01 DIAGNOSIS — N3941 Urge incontinence: Secondary | ICD-10-CM

## 2024-01-01 NOTE — Telephone Encounter (Signed)
 No voice mail.

## 2024-01-01 NOTE — Telephone Encounter (Signed)
 There is no oral pain medication which would be effective for cystoscopy.  I can send in an Rx for Valium for mild sedation or he can schedule in the OR under IV sedation

## 2024-01-01 NOTE — Telephone Encounter (Signed)
 Pt called triage line stating he would like to have something sent in for pain prior to Cystoscopy. Pt states he has had a Cysto in the past and it was really painful. Please advise.

## 2024-01-01 NOTE — Telephone Encounter (Signed)
 Called pt informed him of the options below pt states that he would like to move forward with cysto in the OR as he cannot tolerate the procedure in office.

## 2024-01-07 ENCOUNTER — Other Ambulatory Visit: Payer: Self-pay

## 2024-01-07 ENCOUNTER — Telehealth: Payer: Self-pay

## 2024-01-07 DIAGNOSIS — R310 Gross hematuria: Secondary | ICD-10-CM

## 2024-01-07 NOTE — Addendum Note (Signed)
 Addended by: Letta Kocher A on: 01/07/2024 04:51 PM   Modules accepted: Orders

## 2024-01-07 NOTE — Telephone Encounter (Signed)
  Per Dr. Lonna Cobb, Patient is to be scheduled for Cystoscopy with Left Retrograde Pyelogram, Possible Left Ureteroscopy, Possible Bladder Biopsy, Possible Transurethral Resection of Bladder Tumor   Mr. Dieudonne was contacted and possible surgical dates were discussed, Tuesday April 1st, 2025 was agreed upon for surgery.   Patient was instructed that Dr. Lonna Cobb will require them to provide a pre-op UA & CX prior to surgery. This was ordered and scheduled drop off appointment was made for 01/12/2024.    Patient was directed to call [phone] between 1-3pm the day before surgery to find out surgical arrival time.  Instructions were given not to eat or drink from midnight on the night before surgery and have a driver for the day of surgery. On the surgery day patient was instructed to enter through the [facility] entrance of [facility] report the Same Day Surgery desk.   Pre-Admit Testing will be in contact via phone to set up an interview with the anesthesia team to review your history and medications prior to surgery.   Reminder of this information was sent via MyChart to the patient.

## 2024-01-07 NOTE — Addendum Note (Signed)
 Addended by: Riki Altes on: 01/07/2024 07:23 AM   Modules accepted: Orders

## 2024-01-07 NOTE — Progress Notes (Signed)
   [facility] Surgical Posting Form  Surgery Date: Date: 01/20/2024  Surgeon: Dr. Irineo Axon, MD  Inpt ( No  )   Outpt (Yes)   Obs ( Yes  )   Diagnosis: N39.41 Urge Incontinence, R31.0 Gross Hematuria, C68.9 Urothelial Carcinoma  -CPT: 74420, 86578, 46962, 95284  Surgery: Cystoscopy with Left Retrograde Pyelogram, Possible Left Ureteroscopy, Possible Bladder Biopsy, Possible Transurethral Resection of Bladder Tumor  Stop Anticoagulations: Yes and also hold ASA  Cardiac/Medical/Pulmonary Clearance needed: yes  Clearance needed from Dr: Neita Garnet, MD  Clearance request sent on: Date: 01/07/24   *Orders entered into EPIC  Date: 01/07/24   *Case booked in EPIC  Date: 01/07/24  *Notified pt of Surgery: Date: 01/07/24  PRE-OP UA & CX: yes, will obtain on 01/12/2024  *Placed into Prior Authorization Work Angela Nevin Date: 01/07/24  Assistant/laser/rep:No

## 2024-01-07 NOTE — Progress Notes (Signed)
 Surgical Physician Order Form [facility]  Dr. Irineo Axon, MD  * Scheduling expectation : Next Available  *Length of Case: 45 minutes  *Clearance needed: yes  *Anticoagulation Instructions: Hold all anticoagulants  *Aspirin Instructions: Hold Aspirin  *Post-op visit Date/Instructions:  1 month follow up-PA  *Diagnosis: Gross hematuria, urge incontinence, history urothelial carcinoma right renal pelvis  *Procedure: Cystoscopy with left retrograde pyelogram; possible left ureteroscopy; possible bladder biopsy/TURBT   Additional orders: N/A  -Admit type: OUTpatient  -Anesthesia: Choice  -VTE Prophylaxis Standing Order SCD's       Other:   -Standing Lab Orders Per Anesthesia    Lab other: UA&Urine Culture  -Standing Test orders EKG/Chest x-ray per Anesthesia       Test other:   - Medications:  Ancef 2gm IV  -Other orders:  N/A

## 2024-01-07 NOTE — Progress Notes (Signed)
  Phone Number: [phone] for Surgical Coordinator Fax Number: [phone]  REQUEST FOR SURGICAL CLEARANCE       Date: Date: 01/07/24  Faxed to: Dr. Neita Garnet, MD  Surgeon: Dr. Irineo Axon, MD     Date of Surgery: 01/20/2024  Operation: Cystoscopy with Left Retrograde Pyelogram, Possible Left Ureteroscopy, Possible Bladder Biopsy, Possible Transurethral Resection of Bladder Tumor   Anesthesia Type: Outpatient   Diagnosis: Gross Hematuria, Urge Incontinence, Urothelial Carcinoma  Patient Requires:   Medical Clearance : Yes  Reason: Patient will need to hold Plavix prior to surgery- preferably 5 days    Risk Assessment:    Low   []       Moderate   []     High   []           This patient is optimized for surgery  YES []       NO   []    I recommend further assessment/workup prior to surgery. YES []      NO  []   Appointment scheduled for: _______________________   Further recommendations: ____________________________________     Physician Signature:__________________________________   Printed Name: ________________________________________   Date: _________________

## 2024-01-08 ENCOUNTER — Other Ambulatory Visit: Payer: Self-pay | Admitting: Family Medicine

## 2024-01-08 NOTE — Telephone Encounter (Signed)
 Copied from CRM [id number]. Topic: Clinical - Medication Refill >> Jan 08, 2024  9:29 AM Macon Large wrote: Most Recent Primary Care Visit:  Provider: Ronnald Ramp  Department: [facility]  Visit Type: NEW PATIENT  Date: 12/31/2023  Medication: JARDIANCE 25 MG TABS tablet  Has the patient contacted their pharmacy? Yes  Is this the correct pharmacy for this prescription? Yes If no, delete pharmacy and type the correct one.  This is the patient's preferred pharmacy:  [facility] - [place], [place] - [address] [place] [address] Phone: [phone] Fax: [phone]   Has the prescription been filled recently? No  Is the patient out of the medication? No  Has the patient been seen for an appointment in the last year OR does the patient have an upcoming appointment? Yes  Can we respond through MyChart? No  Agent: Please be advised that Rx refills may take up to 3 business days. We ask that you follow-up with your pharmacy.

## 2024-01-09 NOTE — Telephone Encounter (Signed)
 Requested medication (s) are due for refill today: Yes  Requested medication (s) are on the active medication list: Yes  Last refill:  12/29/23  Future visit scheduled: Yes  Notes to clinic:  Unable to refill per protocol, last refill by another provider.      Requested Prescriptions  Pending Prescriptions Disp Refills   JARDIANCE 25 MG TABS tablet 30 tablet     Sig: Take 1 tablet (25 mg total) by mouth daily.     Endocrinology:  Diabetes - SGLT2 Inhibitors Failed - 01/09/2024 11:04 AM      Failed - Cr in normal range and within 360 days    Creatinine, Ser  Date Value Ref Range Status  08/31/2022 1.14 0.61 - 1.24 mg/dL Final         Failed - HBA1C is between 0 and 7.9 and within 180 days    Hgb A1c MFr Bld  Date Value Ref Range Status  02/08/2022 6.7 (H) 4.8 - 5.6 % Final    Comment:    (NOTE) Pre diabetes:          5.7%-6.4%  Diabetes:              >6.4%  Glycemic control for   <7.0% adults with diabetes          Failed - eGFR in normal range and within 360 days    GFR calc Af Amer  Date Value Ref Range Status  06/03/2018 >60 >60 mL/min Final    Comment:    (NOTE) The eGFR has been calculated using the CKD EPI equation. This calculation has not been validated in all clinical situations. eGFR's persistently <60 mL/min signify possible Chronic Kidney Disease.    GFR, Estimated  Date Value Ref Range Status  08/31/2022 >60 >60 mL/min Final    Comment:    (NOTE) Calculated using the CKD-EPI Creatinine Equation (2021)          Failed - Valid encounter within last 6 months    Recent Outpatient Visits   None     Future Appointments             In 2 weeks Simmons-Robinson, Tawanna Cooler, MD [facility]
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
## 2024-01-12 ENCOUNTER — Ambulatory Visit (INDEPENDENT_AMBULATORY_CARE_PROVIDER_SITE_OTHER): Admitting: Family Medicine

## 2024-01-12 ENCOUNTER — Encounter: Payer: Self-pay | Admitting: Family Medicine

## 2024-01-12 ENCOUNTER — Other Ambulatory Visit

## 2024-01-12 VITALS — BP 131/48 | HR 74 | Temp 98.6°F | Resp 16 | Ht 63.0 in | Wt 131.0 lb

## 2024-01-12 DIAGNOSIS — Z01818 Encounter for other preprocedural examination: Secondary | ICD-10-CM

## 2024-01-12 DIAGNOSIS — Z7984 Long term (current) use of oral hypoglycemic drugs: Secondary | ICD-10-CM

## 2024-01-12 DIAGNOSIS — I10 Essential (primary) hypertension: Secondary | ICD-10-CM | POA: Diagnosis not present

## 2024-01-12 DIAGNOSIS — I739 Peripheral vascular disease, unspecified: Secondary | ICD-10-CM

## 2024-01-12 DIAGNOSIS — I6523 Occlusion and stenosis of bilateral carotid arteries: Secondary | ICD-10-CM

## 2024-01-12 DIAGNOSIS — E1140 Type 2 diabetes mellitus with diabetic neuropathy, unspecified: Secondary | ICD-10-CM

## 2024-01-12 DIAGNOSIS — R310 Gross hematuria: Secondary | ICD-10-CM

## 2024-01-12 LAB — URINALYSIS, COMPLETE
Bilirubin, UA: NEGATIVE
Ketones, UA: NEGATIVE
Leukocytes,UA: NEGATIVE
Nitrite, UA: NEGATIVE
Protein,UA: NEGATIVE
RBC, UA: NEGATIVE
Specific Gravity, UA: 1.015 (ref 1.005–1.030)
Urobilinogen, Ur: 0.2 mg/dL (ref 0.2–1.0)
pH, UA: 5.5 (ref 5.0–7.5)

## 2024-01-12 LAB — MICROSCOPIC EXAMINATION

## 2024-01-12 LAB — POCT GLYCOSYLATED HEMOGLOBIN (HGB A1C)
Est. average glucose Bld gHb Est-mCnc: 120
Hemoglobin A1C: 5.8 % — AB (ref 4.0–5.6)

## 2024-01-12 MED ORDER — GABAPENTIN 100 MG PO TABS: 400.0000 mg | ORAL_TABLET | Freq: Two times a day (BID) | ORAL | Status: DC

## 2024-01-12 NOTE — Patient Instructions (Signed)
 Please review the attached list of medications and notify my office if there are any errors.   Increase gabapentin 100mg  to 4 tablets twice a day  Skip the Plavix (clopidogrel) for 5 days days before the urology procedure. Start back on it the next day if OK with the urologist.

## 2024-01-12 NOTE — Telephone Encounter (Signed)
 LOV 4*10*25 NOV [id number] LRF [id number] LABS [id number]

## 2024-01-13 ENCOUNTER — Telehealth: Payer: Self-pay

## 2024-01-13 ENCOUNTER — Other Ambulatory Visit: Payer: Self-pay

## 2024-01-13 ENCOUNTER — Encounter
Admission: RE | Admit: 2024-01-13 | Discharge: 2024-01-13 | Disposition: A | Discharge status: Home / Self Care | Source: Ambulatory Visit | Attending: Urology | Admitting: Urology

## 2024-01-13 ENCOUNTER — Encounter: Payer: Self-pay | Admitting: Urgent Care

## 2024-01-13 VITALS — Ht 63.0 in | Wt 135.0 lb

## 2024-01-13 DIAGNOSIS — Z01818 Encounter for other preprocedural examination: Secondary | ICD-10-CM | POA: Insufficient documentation

## 2024-01-13 DIAGNOSIS — Z8673 Personal history of transient ischemic attack (TIA), and cerebral infarction without residual deficits: Secondary | ICD-10-CM | POA: Diagnosis not present

## 2024-01-13 DIAGNOSIS — I10 Essential (primary) hypertension: Secondary | ICD-10-CM

## 2024-01-13 DIAGNOSIS — B182 Chronic viral hepatitis C: Secondary | ICD-10-CM

## 2024-01-13 DIAGNOSIS — E1140 Type 2 diabetes mellitus with diabetic neuropathy, unspecified: Secondary | ICD-10-CM

## 2024-01-13 DIAGNOSIS — Z01810 Encounter for preprocedural cardiovascular examination: Secondary | ICD-10-CM

## 2024-01-13 DIAGNOSIS — Z01812 Encounter for preprocedural laboratory examination: Secondary | ICD-10-CM

## 2024-01-13 HISTORY — DX: Radiculopathy, lumbar region: M54.16

## 2024-01-13 HISTORY — DX: Essential (primary) hypertension: I10

## 2024-01-13 HISTORY — DX: Mixed hyperlipidemia: E78.2

## 2024-01-13 HISTORY — DX: Benign prostatic hyperplasia without lower urinary tract symptoms: N40.0

## 2024-01-13 HISTORY — DX: Type 2 diabetes mellitus without complications: E11.9

## 2024-01-13 HISTORY — DX: Atherosclerosis of aorta: I70.0

## 2024-01-13 HISTORY — DX: Spondylosis without myelopathy or radiculopathy, lumbar region: M47.816

## 2024-01-13 HISTORY — DX: Malignant neoplasm of urinary organ, unspecified: C68.9

## 2024-01-13 LAB — CBC
HCT: 46.1 % (ref 39.0–52.0)
Hemoglobin: 15.4 g/dL (ref 13.0–17.0)
MCH: 30.2 pg (ref 26.0–34.0)
MCHC: 33.4 g/dL (ref 30.0–36.0)
MCV: 90.4 fL (ref 80.0–100.0)
Platelets: 254 10*3/uL (ref 150–400)
RBC: 5.1 MIL/uL (ref 4.22–5.81)
RDW: 16.8 % — ABNORMAL HIGH (ref 11.5–15.5)
WBC: 11.5 10*3/uL — ABNORMAL HIGH (ref 4.0–10.5)
nRBC: 0 % (ref 0.0–0.2)

## 2024-01-13 LAB — BASIC METABOLIC PANEL
Anion gap: 11 (ref 5–15)
BUN: 28 mg/dL — ABNORMAL HIGH (ref 8–23)
CO2: 26 mmol/L (ref 22–32)
Calcium: 10 mg/dL (ref 8.9–10.3)
Chloride: 100 mmol/L (ref 98–111)
Creatinine, Ser: 1.33 mg/dL — ABNORMAL HIGH (ref 0.61–1.24)
GFR, Estimated: 55 mL/min — ABNORMAL LOW (ref >60)
Glucose, Bld: 102 mg/dL — ABNORMAL HIGH (ref 70–99)
Potassium: 4.1 mmol/L (ref 3.5–5.1)
Sodium: 137 mmol/L (ref 135–145)

## 2024-01-13 MED ORDER — JARDIANCE 25 MG PO TABS: 25.0000 mg | ORAL_TABLET | Freq: Every day | ORAL | 3 refills | Status: DC

## 2024-01-13 NOTE — Patient Instructions (Addendum)
 Your procedure is scheduled on: Tuesday, April 1 Report to the Registration Desk on the 1st floor of the [facility] To find out your arrival time, please call [phone] between 1PM - 3PM on: Monday, March 31 If your arrival time is 6:00 am, do not arrive before that time as the [facility] entrance doors do not open until 6:00 am.  REMEMBER: Instructions that are not followed completely may result in serious medical risk, up to and including death; or upon the discretion of your surgeon and anesthesiologist your surgery may need to be rescheduled.  Do not eat or drink after midnight the night before surgery.  No gum chewing or hard candies.  One week prior to surgery: starting March 25 Stop Anti-inflammatories (NSAIDS) such as Advil, Aleve, Ibuprofen, Motrin, Naproxen, Naprosyn and Aspirin based products such as Excedrin, Goody's Powder, BC Powder. Stop ANY OVER THE COUNTER supplements until after surgery. Stop multiple vitamins.  You may however, continue to take Tylenol if needed for pain up until the day of surgery.  clopidogrel (PLAVIX) - hold for 5 days before surgery. Last day to TAKE is Wednesday, March 26. Resume AFTER surgery per surgeon instruction.  JARDIANCE - hold for 3 days before surgery. Last day to TAKE is Friday, March 28. Resume AFTER surgery.  metFORMIN - hold for 2 days before surgery. Last day to TAKE is Saturday, March 29. Resume AFTER surgery.  Continue taking all of your other prescription medications up until the day of surgery.  ON THE DAY OF SURGERY ONLY TAKE THESE MEDICATIONS WITH SIPS OF WATER:  amLODipine  citalopram (CELEXA)  gabapentin   No Alcohol for 24 hours before or after surgery.  No Smoking including e-cigarettes for 24 hours before surgery.  No chewable tobacco products for at least 6 hours before surgery.  No nicotine patches on the day of surgery.  Do not use any "recreational" drugs for at least a week (preferably 2 weeks)  before your surgery.  Please be advised that the combination of cocaine and anesthesia may have negative outcomes, up to and including death. If you test positive for cocaine, your surgery will be cancelled.  On the morning of surgery brush your teeth with toothpaste and water, you may rinse your mouth with mouthwash if you wish. Do not swallow any toothpaste or mouthwash.  Do not wear jewelry, make-up, hairpins, clips or nail polish.  For welded (permanent) jewelry: bracelets, anklets, waist bands, etc.  Please have this removed prior to surgery.  If it is not removed, there is a chance that hospital personnel will need to cut it off on the day of surgery.  Do not wear lotions, powders, or perfumes.   Do not shave body hair from the neck down 48 hours before surgery.  Contact lenses, hearing aids and dentures may not be worn into surgery.  Do not bring valuables to the hospital. [facility] is not responsible for any missing/lost belongings or valuables.   Notify your doctor if there is any change in your medical condition (cold, fever, infection).  Wear comfortable clothing (specific to your surgery type) to the hospital.  After surgery, you can help prevent lung complications by doing breathing exercises.  Take deep breaths and cough every 1-2 hours.   If you are being discharged the day of surgery, you will not be allowed to drive home. You will need a responsible individual to drive you home and stay with you for 24 hours after surgery.  If you are taking public transportation, you will need to have a responsible individual with you.  Please call the Pre-admissions Testing Dept. at [phone] if you have any questions about these instructions.  Surgery Visitation Policy:  Patients having surgery or a procedure may have two visitors.  Children under the age of 82 must have an adult with them who is not the patient.  Temporary Visitor Restrictions Due to increasing  cases of flu, RSV and COVID-19: Children ages 40 and under will not be able to visit patients in [facility] hospitals under most circumstances.

## 2024-01-13 NOTE — Telephone Encounter (Signed)
 Received clearance back from Dr. Sherrie Mustache, Advised to hold Plavix for 5 days prior to surgery. Spoke with [name]. [name]. Is aware and verbalized understanding.

## 2024-01-13 NOTE — Telephone Encounter (Signed)
 Per dispense hx it was dispensed on 3/15-17/25 #90

## 2024-01-14 LAB — CULTURE, URINE COMPREHENSIVE

## 2024-01-15 ENCOUNTER — Encounter: Payer: Self-pay | Admitting: Psychiatry

## 2024-01-15 ENCOUNTER — Other Ambulatory Visit: Payer: Self-pay

## 2024-01-15 ENCOUNTER — Ambulatory Visit (INDEPENDENT_AMBULATORY_CARE_PROVIDER_SITE_OTHER): Payer: Self-pay | Admitting: Psychiatry

## 2024-01-15 VITALS — BP 122/78 | HR 94 | Temp 97.2°F | Ht 63.0 in | Wt 133.6 lb

## 2024-01-15 DIAGNOSIS — F1910 Other psychoactive substance abuse, uncomplicated: Secondary | ICD-10-CM

## 2024-01-15 DIAGNOSIS — F3131 Bipolar disorder, current episode depressed, mild: Secondary | ICD-10-CM | POA: Diagnosis not present

## 2024-01-15 DIAGNOSIS — Z79899 Other long term (current) drug therapy: Secondary | ICD-10-CM | POA: Diagnosis not present

## 2024-01-15 NOTE — Patient Instructions (Signed)
 Continue citalopram 40 mg daily  Continue quetiapine 200 mg at night  Obtain labs (TSH, lipid, hba1c, TSH) at [facility] Next appointment- 5/6 at 10:30,

## 2024-01-20 ENCOUNTER — Ambulatory Visit: Payer: Medicare (Managed Care)

## 2024-01-20 ENCOUNTER — Ambulatory Visit: Payer: Medicare (Managed Care) | Admitting: Urgent Care

## 2024-01-20 ENCOUNTER — Encounter
Admission: RE | Disposition: A | Discharge status: Home / Self Care | Payer: Self-pay | Source: Home / Self Care | Attending: Urology

## 2024-01-20 ENCOUNTER — Ambulatory Visit
Admission: RE | Admit: 2024-01-20 | Discharge: 2024-01-20 | Disposition: A | Discharge status: Home / Self Care | Payer: Medicare (Managed Care) | Attending: Urology | Admitting: Urology

## 2024-01-20 ENCOUNTER — Encounter: Payer: Self-pay | Admitting: Urology

## 2024-01-20 ENCOUNTER — Other Ambulatory Visit: Payer: Self-pay

## 2024-01-20 DIAGNOSIS — C679 Malignant neoplasm of bladder, unspecified: Secondary | ICD-10-CM | POA: Diagnosis not present

## 2024-01-20 DIAGNOSIS — I10 Essential (primary) hypertension: Secondary | ICD-10-CM | POA: Insufficient documentation

## 2024-01-20 DIAGNOSIS — J449 Chronic obstructive pulmonary disease, unspecified: Secondary | ICD-10-CM | POA: Diagnosis not present

## 2024-01-20 DIAGNOSIS — E1140 Type 2 diabetes mellitus with diabetic neuropathy, unspecified: Secondary | ICD-10-CM

## 2024-01-20 DIAGNOSIS — D494 Neoplasm of unspecified behavior of bladder: Secondary | ICD-10-CM

## 2024-01-20 DIAGNOSIS — N401 Enlarged prostate with lower urinary tract symptoms: Secondary | ICD-10-CM | POA: Diagnosis not present

## 2024-01-20 DIAGNOSIS — Z7984 Long term (current) use of oral hypoglycemic drugs: Secondary | ICD-10-CM | POA: Diagnosis not present

## 2024-01-20 DIAGNOSIS — Z905 Acquired absence of kidney: Secondary | ICD-10-CM | POA: Diagnosis not present

## 2024-01-20 DIAGNOSIS — F17210 Nicotine dependence, cigarettes, uncomplicated: Secondary | ICD-10-CM | POA: Diagnosis not present

## 2024-01-20 DIAGNOSIS — Z7902 Long term (current) use of antithrombotics/antiplatelets: Secondary | ICD-10-CM | POA: Insufficient documentation

## 2024-01-20 DIAGNOSIS — E1151 Type 2 diabetes mellitus with diabetic peripheral angiopathy without gangrene: Secondary | ICD-10-CM | POA: Diagnosis not present

## 2024-01-20 DIAGNOSIS — C689 Malignant neoplasm of urinary organ, unspecified: Secondary | ICD-10-CM | POA: Diagnosis present

## 2024-01-20 DIAGNOSIS — C674 Malignant neoplasm of posterior wall of bladder: Secondary | ICD-10-CM | POA: Insufficient documentation

## 2024-01-20 DIAGNOSIS — R569 Unspecified convulsions: Secondary | ICD-10-CM | POA: Insufficient documentation

## 2024-01-20 DIAGNOSIS — N3941 Urge incontinence: Secondary | ICD-10-CM | POA: Insufficient documentation

## 2024-01-20 DIAGNOSIS — R310 Gross hematuria: Secondary | ICD-10-CM

## 2024-01-20 DIAGNOSIS — C680 Malignant neoplasm of urethra: Secondary | ICD-10-CM | POA: Diagnosis not present

## 2024-01-20 DIAGNOSIS — F419 Anxiety disorder, unspecified: Secondary | ICD-10-CM | POA: Insufficient documentation

## 2024-01-20 DIAGNOSIS — Z01812 Encounter for preprocedural laboratory examination: Secondary | ICD-10-CM

## 2024-01-20 DIAGNOSIS — Z79899 Other long term (current) drug therapy: Secondary | ICD-10-CM | POA: Insufficient documentation

## 2024-01-20 HISTORY — PX: CYSTOSCOPY WITH BIOPSY: SHX5122

## 2024-01-20 HISTORY — PX: CYSTOSCOPY W/ RETROGRADES: SHX1426

## 2024-01-20 HISTORY — PX: TRANSURETHRAL RESECTION OF BLADDER TUMOR: SHX2575

## 2024-01-20 LAB — GLUCOSE, CAPILLARY
Glucose-Capillary: 153 mg/dL — ABNORMAL HIGH (ref 70–99)
Glucose-Capillary: 128 mg/dL — ABNORMAL HIGH (ref 70–99)

## 2024-01-20 SURGERY — CYSTOSCOPY, WITH RETROGRADE PYELOGRAM
Anesthesia: General

## 2024-01-20 MED ORDER — CHLORHEXIDINE GLUCONATE 0.12 % MT SOLN
15.0000 mL | Freq: Once | OROMUCOSAL | Status: AC
  Administered 2024-01-20: 15 mL via OROMUCOSAL

## 2024-01-20 MED ORDER — SEVOFLURANE IN SOLN
RESPIRATORY_TRACT | Status: AC
  Filled 2024-01-20: qty 250

## 2024-01-20 MED ORDER — OXYCODONE HCL 5 MG PO TABS
ORAL_TABLET | ORAL | Status: AC
  Filled 2024-01-20: qty 1

## 2024-01-20 MED ORDER — LIDOCAINE HCL (PF) 2 % IJ SOLN
INTRAMUSCULAR | Status: AC
  Filled 2024-01-20: qty 5

## 2024-01-20 MED ORDER — FENTANYL CITRATE (PF) 100 MCG/2ML IJ SOLN
INTRAMUSCULAR | Status: AC
  Filled 2024-01-20: qty 2

## 2024-01-20 MED ORDER — HYDROCODONE-ACETAMINOPHEN 5-325 MG PO TABS: 1.0000 | ORAL_TABLET | Freq: Four times a day (QID) | ORAL | 0 refills | Status: DC | PRN

## 2024-01-20 MED ORDER — LIDOCAINE HCL (CARDIAC) PF 100 MG/5ML IV SOSY
PREFILLED_SYRINGE | INTRAVENOUS | Status: DC | PRN
  Administered 2024-01-20: 60 mg via INTRAVENOUS

## 2024-01-20 MED ORDER — SODIUM CHLORIDE 0.9 % IR SOLN
Status: DC | PRN
  Administered 2024-01-20: 18000 mL via INTRAVESICAL

## 2024-01-20 MED ORDER — PROPOFOL 10 MG/ML IV BOLUS
INTRAVENOUS | Status: AC
  Filled 2024-01-20: qty 20

## 2024-01-20 MED ORDER — CEFAZOLIN SODIUM-DEXTROSE 2-4 GM/100ML-% IV SOLN
2.0000 g | INTRAVENOUS | Status: AC
  Administered 2024-01-20: 2 g via INTRAVENOUS

## 2024-01-20 MED ORDER — FENTANYL CITRATE (PF) 100 MCG/2ML IJ SOLN
25.0000 ug | INTRAMUSCULAR | Status: DC | PRN
  Administered 2024-01-20 (×2): 50 ug via INTRAVENOUS

## 2024-01-20 MED ORDER — MIDAZOLAM HCL 2 MG/2ML IJ SOLN
INTRAMUSCULAR | Status: AC
  Filled 2024-01-20: qty 2

## 2024-01-20 MED ORDER — OXYCODONE HCL 5 MG PO TABS
5.0000 mg | ORAL_TABLET | Freq: Once | ORAL | Status: AC | PRN
  Administered 2024-01-20: 5 mg via ORAL

## 2024-01-20 MED ORDER — PROPOFOL 1000 MG/100ML IV EMUL
INTRAVENOUS | Status: AC
  Filled 2024-01-20: qty 100

## 2024-01-20 MED ORDER — EPHEDRINE SULFATE-NACL 50-0.9 MG/10ML-% IV SOSY
PREFILLED_SYRINGE | INTRAVENOUS | Status: DC | PRN
  Administered 2024-01-20: 5 mg via INTRAVENOUS

## 2024-01-20 MED ORDER — OXYCODONE HCL 5 MG/5ML PO SOLN: 5.0000 mg | Freq: Once | ORAL | Status: AC | PRN

## 2024-01-20 MED ORDER — MIDAZOLAM HCL 2 MG/2ML IJ SOLN
INTRAMUSCULAR | Status: DC | PRN
Start: 2024-01-20 — End: 2024-01-20
  Administered 2024-01-20: 2 mg via INTRAVENOUS

## 2024-01-20 MED ORDER — PROPOFOL 10 MG/ML IV BOLUS
INTRAVENOUS | Status: DC | PRN
  Administered 2024-01-20 (×2): 50 mg via INTRAVENOUS
  Administered 2024-01-20: 100 ug/kg/min via INTRAVENOUS
  Administered 2024-01-20: 20 mg via INTRAVENOUS
  Administered 2024-01-20 (×2): 30 mg via INTRAVENOUS
  Administered 2024-01-20: 50 ug via INTRAVENOUS

## 2024-01-20 MED ORDER — CHLORHEXIDINE GLUCONATE 0.12 % MT SOLN
OROMUCOSAL | Status: AC
Start: 2024-01-20 — End: ?
  Filled 2024-01-20: qty 15

## 2024-01-20 MED ORDER — ONDANSETRON HCL 4 MG/2ML IJ SOLN
INTRAMUSCULAR | Status: AC
  Filled 2024-01-20: qty 2

## 2024-01-20 MED ORDER — SODIUM CHLORIDE 0.9 % IV SOLN: INTRAVENOUS | Status: DC

## 2024-01-20 MED ORDER — CEFAZOLIN SODIUM-DEXTROSE 2-4 GM/100ML-% IV SOLN
INTRAVENOUS | Status: AC
  Filled 2024-01-20: qty 100

## 2024-01-20 MED ORDER — DEXMEDETOMIDINE HCL IN NACL 80 MCG/20ML IV SOLN
INTRAVENOUS | Status: AC
  Filled 2024-01-20: qty 20

## 2024-01-20 MED ORDER — IOHEXOL 180 MG/ML  SOLN
INTRAMUSCULAR | Status: DC | PRN
  Administered 2024-01-20: 10 mL

## 2024-01-20 MED ORDER — DEXAMETHASONE SODIUM PHOSPHATE 10 MG/ML IJ SOLN
INTRAMUSCULAR | Status: AC
  Filled 2024-01-20: qty 1

## 2024-01-20 MED ORDER — ORAL CARE MOUTH RINSE: 15.0000 mL | Freq: Once | OROMUCOSAL | Status: AC

## 2024-01-20 MED ORDER — DEXMEDETOMIDINE HCL IN NACL 80 MCG/20ML IV SOLN
INTRAVENOUS | Status: DC | PRN
  Administered 2024-01-20 (×2): 10 ug via INTRAVENOUS

## 2024-01-20 MED ORDER — PHENYLEPHRINE 80 MCG/ML (10ML) SYRINGE FOR IV PUSH (FOR BLOOD PRESSURE SUPPORT)
PREFILLED_SYRINGE | INTRAVENOUS | Status: DC | PRN
  Administered 2024-01-20: 160 ug via INTRAVENOUS
  Administered 2024-01-20: 80 ug via INTRAVENOUS
  Administered 2024-01-20: 160 ug via INTRAVENOUS

## 2024-01-20 SURGICAL SUPPLY — 39 items
BAG DRAIN SIEMENS DORNER NS (MISCELLANEOUS) ×2 IMPLANT
BAG PRESSURE INF REUSE 3000 (BAG) ×2 IMPLANT
BAG URINE DRAIN 2000ML AR STRL (UROLOGICAL SUPPLIES) ×2 IMPLANT
BRUSH SCRUB EZ 1% IODOPHOR (MISCELLANEOUS) ×2 IMPLANT
BRUSH SCRUB EZ 4% CHG (MISCELLANEOUS) ×2 IMPLANT
CATH FOL 2WAY LX 18X30 (CATHETERS) IMPLANT
CATH FOLEY 2WAY 18X30 (CATHETERS) IMPLANT
CATH FOLEY 2WAY SIL 18X30 (CATHETERS) IMPLANT
CATH URET FLEX-TIP 2 LUMEN 10F (CATHETERS) ×2 IMPLANT
CATH URETL OPEN 5X70 (CATHETERS) ×2 IMPLANT
CATH URETL OPEN END 6X70 (CATHETERS) ×2 IMPLANT
CNTNR URN SCR LID CUP LEK RST (MISCELLANEOUS) ×2 IMPLANT
CUP MEDICINE 2OZ PLAST GRAD ST (MISCELLANEOUS) IMPLANT
DRAPE UTILITY 15X26 TOWEL STRL (DRAPES) ×2 IMPLANT
DRSG TELFA 3X4 N-ADH STERILE (GAUZE/BANDAGES/DRESSINGS) ×2 IMPLANT
ELECT LOOP 22F BIPOLAR SML (ELECTROSURGICAL) IMPLANT
ELECT REM PT RETURN 9FT ADLT (ELECTROSURGICAL) IMPLANT
ELECTRODE LOOP 22F BIPOLAR SML (ELECTROSURGICAL) IMPLANT
ELECTRODE REM PT RTRN 9FT ADLT (ELECTROSURGICAL) ×2 IMPLANT
GLOVE BIOGEL PI IND STRL 7.5 (GLOVE) ×2 IMPLANT
GOWN STRL REUS W/ TWL LRG LVL3 (GOWN DISPOSABLE) ×4 IMPLANT
GOWN STRL REUS W/ TWL XL LVL3 (GOWN DISPOSABLE) ×2 IMPLANT
GOWN STRL REUS W/TWL XL LVL4 (GOWN DISPOSABLE) ×2 IMPLANT
GUIDEWIRE GREEN .038 145CM (MISCELLANEOUS) ×2 IMPLANT
GUIDEWIRE STR DUAL SENSOR (WIRE) ×2 IMPLANT
KIT TURNOVER CYSTO (KITS) ×2 IMPLANT
LOOP CUT BIPOLAR 24F LRG (ELECTROSURGICAL) IMPLANT
NDL SAFETY ECLIPSE 18X1.5 (NEEDLE) ×2 IMPLANT
PACK CYSTO AR (MISCELLANEOUS) ×2 IMPLANT
SET CYSTO W/LG BORE CLAMP LF (SET/KITS/TRAYS/PACK) ×2 IMPLANT
SET IRRIG Y TYPE TUR BLADDER L (SET/KITS/TRAYS/PACK) ×2 IMPLANT
SHEATH NAVIGATOR HD 12/14X36 (SHEATH) ×2 IMPLANT
SOL .9 NS 3000ML IRR UROMATIC (IV SOLUTION) ×4 IMPLANT
SURGILUBE 2OZ TUBE FLIPTOP (MISCELLANEOUS) ×2 IMPLANT
SYR TOOMEY IRRIG 70ML (MISCELLANEOUS) ×2 IMPLANT
SYRINGE TOOMEY IRRIG 70ML (MISCELLANEOUS) ×2 IMPLANT
WATER STERILE IRR 1000ML POUR (IV SOLUTION) ×2 IMPLANT
WATER STERILE IRR 3000ML UROMA (IV SOLUTION) ×2 IMPLANT
WATER STERILE IRR 500ML POUR (IV SOLUTION) ×2 IMPLANT

## 2024-01-20 NOTE — Discharge Instructions (Signed)
 May resume Plavix 01/21/2024

## 2024-01-20 NOTE — Op Note (Signed)
   Preoperative diagnosis:  Gross hematuria History urothelial carcinoma right upper tract  Postoperative diagnosis:  Urothelial carcinoma with multifocal tumors bladder, prostatic urethra and urethra  Procedure: Transurethral resection bladder/prostatic tumor (2-5 cm) Biopsy/fulguration anterior urethral tumors Left retrograde pyelogram with interpretation  Surgeon: Riki Altes, MD  Anesthesia: General  Complications: None  Intraoperative findings:  3 papillary tumors anterior urethra (2 and penile urethra and 1 bulbar urethra with largest measuring ~ 5 mm Multifocal papillary tumors prostatic urethra-the largest at the bladder neck measuring ~ 3 cm Solitary papillary tumor upper posterior wall measuring ~ 1.5 cm Left retrograde pyelogram with normal ureter without stricture, filling defect.  Collecting system normal in appearance  EBL: Minimal  Specimens:  Urethral tumors Bladder tumor Prostatic urethral tumor Base prostatic urethral tumors  Indication: [name] is a 78 y.o. male status post right nephroureterectomy February 2024 for upper tract urothelial carcinoma.  Recently seen for recurrent gross hematuria.  Refer to the H&P for details.  After reviewing the management options for treatment, he elected to proceed with the above surgical procedure(s). We have discussed the potential benefits and risks of the procedure, side effects of the proposed treatment, the likelihood of the patient achieving the goals of the procedure, and any potential problems that might occur during the procedure or recuperation. Informed consent has been obtained.  Description of procedure:  The patient was taken to the operating room and general anesthesia was induced.  The patient was placed in the dorsal lithotomy position, prepped and draped in the usual sterile fashion, and preoperative antibiotics were administered. A preoperative time-out was performed.   A 21 French cystoscope  was lubricated, inserted per urethra and advanced proximally into the bladder with findings as described above.  The urethral tumors were removed in toto with rigid biopsy forceps.  No significant bleeding was noted.  The cystoscope was removed and a 62F continuous-flow resectoscope sheath with obturator was lubricated, placed per urethra and advanced into the bladder without difficulty.  The obturator was replaced with an Wandra Scot resectoscope with loop.  Repeat cystoscopy was performed.  The bladder tumor was pedunculated.  It was resected in its entirety with bipolar current.  All specimen was removed via irrigation and sent separately.  No other bladder mucosal lesions were identified.  The prostatic urethral tumors were then resected in their entirety.  Hemostasis was obtained with cautery.  All specimen was removed via irrigation.  Deeper sections of the prostatic urethra were then performed and sent separately as a base of prostatic urethral tumor.  All resection sites were then closely examined and hemostasis was noted to be excellent.  No other bladder mucosal or prostatic urethral mucosal lesions were identified.  The urethral biopsy sites were examined upon resectoscope removal and the base was spot coagulated with cautery.  An 78F Foley catheter was placed and the balloon inflated with 10 mL of sterile water.  Catheter was irrigated with return of pink-tinged effluent  After anesthetic reversal he was transported to the PACU in stable condition.  Plan: Catheter removal 48 hours Will contact with pathology results   Riki Altes, M.D.

## 2024-01-20 NOTE — Anesthesia Preprocedure Evaluation (Signed)
 Anesthesia Evaluation  Patient identified by MRN, date of birth, ID band Patient awake    Reviewed: Allergy & Precautions, NPO status , Patient's Chart, lab work & pertinent test results  History of Anesthesia Complications Negative for: history of anesthetic complications  Airway Mallampati: III  TM Distance: >3 FB Neck ROM: full    Dental  (+) Chipped, Poor Dentition, Missing   Pulmonary neg shortness of breath, COPD, Current Smoker and Patient abstained from smoking.   Pulmonary exam normal        Cardiovascular Exercise Tolerance: Good hypertension, (-) angina + Peripheral Vascular Disease  (-) Past MI Normal cardiovascular exam     Neuro/Psych Seizures -, Poorly Controlled,  PSYCHIATRIC DISORDERS       Neuromuscular disease CVA, Residual Symptoms    GI/Hepatic negative GI ROS,,,(+) Hepatitis -  Endo/Other  diabetes, Type 2    Renal/GU Renal disease     Musculoskeletal   Abdominal   Peds  Hematology negative hematology ROS (+)   Anesthesia Other Findings Past Medical History: 03/12/2018: Acute right PCA stroke (HCC)     Comment:  a.) residual loss of peripheral vision and balance               issues No date: Anemia No date: Anxiety No date: Atherosclerosis of aorta (HCC) No date: Benign prostatic hyperplasia No date: Bilateral carotid artery stenosis 03/13/2018: Cerebral atherosclerosis     Comment:  a.) CTA neck 03/13/2018: proximal inferior division               RIGHT M2 segment stenosis with normal distal               opacification; mod stenosis of the distal RIGHT P2               segment (likely culprit of 03/12/2018 CVA); diffuse small              vessel Dz No date: Cervical spondylosis No date: Chronic back pain     Comment:  a.) followed by pain mgmt at [facility] No date: DDD (degenerative disc disease), lumbar No date: Depression No date: Essential hypertension No date: GERD (gastroesophageal  reflux disease) No date: HCV (hepatitis C virus)     Comment:  a.) s/p Tx with ledipasvir/sofosbuvir No date: History of kidney stones 2023: Leg ulcer, left (HCC)     Comment:  has been seen at the wound clinic 03/13/2018: Lesion of BILATERAL parotid glands     Comment:  a.) CTA neck 03/13/2018: RIGHT (1.3 x 1.8 x 2.4 cm) and               LEFT (1.5 x 0.9 1.2 cm); b.) CT soft tissue neck               04/05/2021: RIGHT (2.3 x 1.6 cm) and LEFT (2.3 x 1.6 cm) No date: Long term current use of antithrombotics/antiplatelets     Comment:  a.) daily DAPT therapy (ASA + clopidogrel) No date: Lumbar radiculitis No date: Lumbar spondylosis No date: Mixed hyperlipidemia No date: Overdose of benzodiazepine 02/07/2022: Overuse of medication     Comment:  a.) admitted to [facility] with decreased LOC secondary to               overuse of "Delta 8 THC gummies" No date: Peripheral vascular disease (HCC) No date: Polysubstance abuse (HCC)     Comment:  a.) marijuana + cocaine + heroin + BZO No date: Right-sided Bell's palsy No  date: Type 2 diabetes mellitus without complication, without long- term current use of insulin (HCC) 02/07/2022: Unresponsive episode No date: Unsteady gait     Comment:  a.) following CVA in 2019; uses ambulation aid (walker)               for balance No date: Urothelial carcinoma (HCC) No date: Wears dentures     Comment:  Has full upper.  Does not wear.  Past Surgical History: 05/13/2012: ANTERIOR LUMBAR FUSION     Comment:  Procedure: ANTERIOR LUMBAR FUSION 1 LEVEL;  Surgeon:               Venita Lick, MD;  Location: [facility] OR;  Service:               Orthopedics;  Laterality: N/A;  ALIF L5-S1 01/01/2021: CATARACT EXTRACTION W/PHACO; Left     Comment:  Procedure: CATARACT EXTRACTION PHACO AND INTRAOCULAR               LENS PLACEMENT (IOC) LEFT DIABETIC 2.28 00:24.7;                Surgeon: Nevada Crane, MD;  Location: [facility]               SURGERY [facility];  Service:  Ophthalmology;  Laterality: Left;              covid + 11-30-20 01/15/2021: CATARACT EXTRACTION W/PHACO; Right     Comment:  Procedure: CATARACT EXTRACTION PHACO AND INTRAOCULAR               LENS PLACEMENT (IOC) RIGHT DIABETIC;  Surgeon: Nevada Crane, MD;  Location: [facility]ERY [facility];                Service: Ophthalmology;  Laterality: Right;  2.62 0:34.5 No date: CHOLECYSTECTOMY 09/19/2021: COLONOSCOPY WITH PROPOFOL; N/A     Comment:  Procedure: COLONOSCOPY WITH PROPOFOL;  Surgeon: [place],               Boykin Nearing, MD;  Location: [facility] ENDOSCOPY;  Service:               Gastroenterology;  Laterality: N/A; 08/04/2018: CYSTOSCOPY W/ RETROGRADES; Left     Comment:  Procedure: CYSTOSCOPY WITH RETROGRADE PYELOGRAM;                Surgeon: Riki Altes, MD;  Location: [facility] ORS;                Service: Urology;  Laterality: Left; 07/30/2022: CYSTOSCOPY W/ URETERAL STENT PLACEMENT; Right     Comment:  Procedure: CYSTOSCOPY WITH URETERAL STENT REPLACEMENT;                Surgeon: Riki Altes, MD;  Location: [facility] ORS;                Service: Urology;  Laterality: Right; 01/18/2020: CYSTOSCOPY WITH INSERTION OF UROLIFT; N/A     Comment:  Procedure: CYSTOSCOPY WITH INSERTION OF UROLIFT;                Surgeon: Riki Altes, MD;  Location: [facility] ORS;                Service: Urology;  Laterality: N/A; 06/18/2022: CYSTOSCOPY WITH RETROGRADE PYELOGRAM, URETEROSCOPY AND  STENT PLACEMENT; Left     Comment:  Procedure: CYSTOSCOPY WITH RETROGRADE PYELOGRAM;  Surgeon: Riki Altes, MD;  Location: [facility] ORS;                Service: Urology;  Laterality: Left; 08/04/2018: CYSTOSCOPY WITH STENT PLACEMENT; Left     Comment:  Procedure: CYSTOSCOPY WITH STENT PLACEMENT;  Surgeon:               Riki Altes, MD;  Location: [facility] ORS;  Service:               Urology;  Laterality: Left; 06/18/2022: CYSTOSCOPY WITH URETEROSCOPY AND STENT PLACEMENT; Right      Comment:  Procedure: CYSTOSCOPY WITH URETEROSCOPY AND STENT               PLACEMENT;  Surgeon: Riki Altes, MD;  Location:               [facility] ORS;  Service: Urology;  Laterality: Right; 2012: EXTRACORPOREAL SHOCK WAVE LITHOTRIPSY 08/20/2018: EXTRACORPOREAL SHOCK WAVE LITHOTRIPSY; Left     Comment:  Procedure: EXTRACORPOREAL SHOCK WAVE LITHOTRIPSY (ESWL);              Surgeon: Vanna Scotland, MD;  Location: [facility] ORS;                Service: Urology;  Laterality: Left; (cancelled) 08/27/2018: EXTRACORPOREAL SHOCK WAVE LITHOTRIPSY; Left     Comment:  Procedure: EXTRACORPOREAL SHOCK WAVE LITHOTRIPSY (ESWL);              Surgeon: Riki Altes, MD;  Location: [facility] ORS;                Service: Urology;  Laterality: Left; 10/15/2018: EXTRACORPOREAL SHOCK WAVE LITHOTRIPSY; Right     Comment:  Procedure: EXTRACORPOREAL SHOCK WAVE LITHOTRIPSY (ESWL);              Surgeon: Sondra Come, MD;  Location: [facility] ORS;                Service: Urology;  Laterality: Right; No date: KIDNEY STONE SURGERY 11/21/2022: NEPHRECTOMY, RADICAL, ROBOT ASSISTED, LAPAROSCOPIC; Right     Comment:  with ureterectomy 07/30/2022: URETERAL BIOPSY; Right     Comment:  Procedure: URETERAL BIOPSY OF RENAL PELVIS;  Surgeon:               Riki Altes, MD;  Location: [facility] ORS;  Service:               Urology;  Laterality: Right; 07/30/2022: URETEROSCOPY; Right     Comment:  Procedure: URETEROSCOPY;  Surgeon: Riki Altes, MD;              Location: [facility] ORS;  Service: Urology;  Laterality:               Right;  BMI    Body Mass Index: 23.91 kg/m      Reproductive/Obstetrics negative OB ROS                             Anesthesia Physical Anesthesia Plan  ASA: 3  Anesthesia Plan: General LMA   Post-op Pain Management:    Induction: Intravenous  PONV Risk Score and Plan: Dexamethasone, Ondansetron, Midazolam and Treatment may vary due to age or medical  condition  Airway Management Planned: LMA  Additional Equipment:   Intra-op Plan:   Post-operative Plan: Extubation in OR  Informed Consent: I have reviewed the patients History and Physical, chart, labs and discussed the procedure including  the risks, benefits and alternatives for the proposed anesthesia with the patient or authorized representative who has indicated his/her understanding and acceptance.     Dental Advisory Given  Plan Discussed with: Anesthesiologist, CRNA and Surgeon  Anesthesia Plan Comments: (Patient consented for risks of anesthesia including but not limited to:  - adverse reactions to medications - damage to eyes, teeth, lips or other oral mucosa - nerve damage due to positioning  - sore throat or hoarseness - Damage to heart, brain, nerves, lungs, other parts of body or loss of life  Patient voiced understanding and assent.)       Anesthesia Quick Evaluation

## 2024-01-20 NOTE — Anesthesia Procedure Notes (Signed)
 Procedure Name: LMA Insertion Date/Time: 01/20/2024 8:15 AM  Performed by: Lysbeth Penner, CRNAPre-anesthesia Checklist: Patient identified, Emergency Drugs available, Suction available, Patient being monitored and Timeout performed Patient Re-evaluated:Patient Re-evaluated prior to induction Oxygen Delivery Method: Circle system utilized Preoxygenation: Pre-oxygenation with 100% oxygen Induction Type: IV induction LMA: LMA inserted LMA Size: 3.0 Number of attempts: 1 Tube secured with: Tape Dental Injury: Teeth and Oropharynx as per pre-operative assessment

## 2024-01-20 NOTE — Interval H&P Note (Signed)
 History and Physical Interval Note:  Noted on recent office visit have microhematuria.  He declined office cystoscopy due to discomfort and requested the procedure be performed under anesthesia/sedation.  We discussed if any abnormalities are noted that bladder biopsy, TURBT and possible ureteroscopy with biopsy would be recommended.  We discussed potential risks including bleeding and infection.  All questions were answered and he desires to proceed.  01/20/2024 7:26 AM  [name]  has presented today for surgery, with the diagnosis of Gross Hematuria, Urge Incontinence, History of Urothelial Carcinoma.  The various methods of treatment have been discussed with the patient and family. After consideration of risks, benefits and other options for treatment, the patient has consented to  Procedure(s): CYSTOSCOPY, WITH RETROGRADE PYELOGRAM (Left) URETEROSCOPY (Left) CYSTOSCOPY, WITH BIOPSY (N/A) TURBT (TRANSURETHRAL RESECTION OF BLADDER TUMOR) (N/A) as a surgical intervention.  The patient's history has been reviewed, patient examined, no change in status, stable for surgery.  I have reviewed the patient's chart and labs.  Questions were answered to the patient's satisfaction.    CV: RRR Lungs: Clear  [name]

## 2024-01-20 NOTE — Transfer of Care (Signed)
 Immediate Anesthesia Transfer of Care Note  Patient: [name]  Procedure(s) Performed: CYSTOSCOPY, WITH RETROGRADE PYELOGRAM (Left) CYSTOSCOPY and Urethral  WITH BIOPSY TURBT (TRANSURETHRAL RESECTION OF BLADDER TUMOR)  Patient Location: PACU  Anesthesia Type:General  Level of Consciousness: drowsy  Airway & Oxygen Therapy: Patient Spontanous Breathing and Patient connected to face mask oxygen  Post-op Assessment: Report given to RN and Post -op Vital signs reviewed and stable  Post vital signs: Reviewed and stable  Last Vitals:  Vitals Value Taken Time  BP 110/69 01/20/24 0940  Temp    Pulse 70 01/20/24 0943  Resp 12 01/20/24 0943  SpO2 100 % 01/20/24 0943  Vitals shown include unfiled device data.  Last Pain:  Vitals:   01/20/24 0627  TempSrc: Temporal  PainSc: 8          Complications: There were no known notable events for this encounter.

## 2024-01-20 NOTE — Anesthesia Postprocedure Evaluation (Signed)
 Anesthesia Post Note  Patient: [name]  Procedure(s) Performed: CYSTOSCOPY, WITH RETROGRADE PYELOGRAM (Left) CYSTOSCOPY and Urethral  WITH BIOPSY TURBT (TRANSURETHRAL RESECTION OF BLADDER TUMOR)  Patient location during evaluation: PACU Anesthesia Type: General Level of consciousness: awake and alert Pain management: pain level controlled Vital Signs Assessment: post-procedure vital signs reviewed and stable Respiratory status: spontaneous breathing, nonlabored ventilation, respiratory function stable and patient connected to nasal cannula oxygen Cardiovascular status: blood pressure returned to baseline and stable Postop Assessment: no apparent nausea or vomiting Anesthetic complications: no   There were no known notable events for this encounter.   Last Vitals:  Vitals:   01/20/24 1015 01/20/24 1025  BP: 128/67 (!) 141/70  Pulse: 69 69  Resp: 12 15  Temp: 36.7 C 36.7 C  SpO2: 94% 99%    Last Pain:  Vitals:   01/20/24 1025  TempSrc: Temporal  PainSc: 0-No pain                 Cleda Mccreedy Charly Holcomb

## 2024-01-21 ENCOUNTER — Encounter: Payer: Self-pay | Admitting: Urology

## 2024-01-21 NOTE — Progress Notes (Signed)
 Established patient visit   Patient: [name]   DOB: [birth year]   78 y.o. Male  MRN: [id number] Visit Date: 01/12/2024  Today's healthcare provider: Mila Merry, MD   Chief Complaint  Patient presents with   Pre-op Exam    Surgical clearance for  Cystoscopy with Left Retrograde Pyelogram, Possible Left Ureteroscopy, Possible Bladder Biopsy, Possible Transurethral Resection of Bladder Tumor    Subjective    HPI Patient presents for pre-procedure evaluation for above, with cystoscopy scheduled for 4-1. He takes clopidogrel CVA history with bilateral carotid artery stenosis. He had not had any unusual bleeding or bruising and no focal neurological sx since being on clopidogrel. He has no history of surgical complications or adverse reactions to anesthesia.   He does have history of type 2 diabetes no only taking Jardiance and metformin which he is tolerating well without adverse effects.  Past Surgical History:  Procedure Laterality Date   ANTERIOR LUMBAR FUSION  05/13/2012   Procedure: ANTERIOR LUMBAR FUSION 1 LEVEL;  Surgeon: Venita Lick, MD;  [facility]: [facility];  Service: Orthopedics;  Laterality: N/A;  ALIF L5-S1   CATARACT EXTRACTION W/PHACO Left 01/01/2021   Procedure: CATARACT EXTRACTION PHACO AND INTRAOCULAR LENS PLACEMENT (IOC) LEFT DIABETIC 2.28 00:24.7;  Surgeon: Nevada Crane, MD;  Location: [facility];  Service: Ophthalmology;  Laterality: Left;  covid + 11-30-20   CATARACT EXTRACTION W/PHACO Right 01/15/2021   Procedure: CATARACT EXTRACTION PHACO AND INTRAOCULAR LENS PLACEMENT (IOC) RIGHT DIABETIC;  Surgeon: Nevada Crane, MD;  Location: [facility];  Service: Ophthalmology;  Laterality: Right;  2.62 0:34.5   CHOLECYSTECTOMY     COLONOSCOPY WITH PROPOFOL N/A 09/19/2021   Procedure: COLONOSCOPY WITH PROPOFOL;  Surgeon: Toledo, Boykin Nearing, MD;  Location: [facility];  Service: Gastroenterology;  Laterality: N/A;   CYSTOSCOPY W/  RETROGRADES Left 08/04/2018   Procedure: CYSTOSCOPY WITH RETROGRADE PYELOGRAM;  Surgeon: Riki Altes, MD;  Location: [facility];  Service: Urology;  Laterality: Left;   CYSTOSCOPY W/ URETERAL STENT PLACEMENT Right 07/30/2022   Procedure: CYSTOSCOPY WITH URETERAL STENT REPLACEMENT;  Surgeon: Riki Altes, MD;  Location: [facility];  Service: Urology;  Laterality: Right;   CYSTOSCOPY WITH BIOPSY N/A 01/20/2024   Procedure: CYSTOSCOPY and Urethral  WITH BIOPSY;  Surgeon: Riki Altes, MD;  Location: [facility];  Service: Urology;  Laterality: N/A;   CYSTOSCOPY WITH INSERTION OF UROLIFT N/A 01/18/2020   Procedure: CYSTOSCOPY WITH INSERTION OF UROLIFT;  Surgeon: Riki Altes, MD;  Location: [facility];  Service: Urology;  Laterality: N/A;   CYSTOSCOPY WITH RETROGRADE PYELOGRAM, URETEROSCOPY AND STENT PLACEMENT Left 06/18/2022   Procedure: CYSTOSCOPY WITH RETROGRADE PYELOGRAM;  Surgeon: Riki Altes, MD;  Location: [facility];  Service: Urology;  Laterality: Left;   CYSTOSCOPY WITH STENT PLACEMENT Left 08/04/2018   Procedure: CYSTOSCOPY WITH STENT PLACEMENT;  Surgeon: Riki Altes, MD;  Location: [facility];  Service: Urology;  Laterality: Left;   CYSTOSCOPY WITH URETEROSCOPY AND STENT PLACEMENT Right 06/18/2022   Procedure: CYSTOSCOPY WITH URETEROSCOPY AND STENT PLACEMENT;  Surgeon: Riki Altes, MD;  Location: [facility];  Service: Urology;  Laterality: Right;   EXTRACORPOREAL SHOCK WAVE LITHOTRIPSY  2012   EXTRACORPOREAL SHOCK WAVE LITHOTRIPSY Left 08/20/2018   Procedure: EXTRACORPOREAL SHOCK WAVE LITHOTRIPSY (ESWL);  Surgeon: Vanna Scotland, MD;  Location: [facility];  Service: Urology;  Laterality: Left; (cancelled)   EXTRACORPOREAL SHOCK WAVE LITHOTRIPSY Left 08/27/2018   Procedure: EXTRACORPOREAL SHOCK WAVE LITHOTRIPSY (ESWL);  Surgeon: Riki Altes, MD;  Location: [facility];  Service: Urology;  Laterality: Left;   EXTRACORPOREAL SHOCK WAVE LITHOTRIPSY Right 10/15/2018    Procedure: EXTRACORPOREAL SHOCK WAVE LITHOTRIPSY (ESWL);  Surgeon: Sondra Come, MD;  Location: [facility];  Service: Urology;  Laterality: Right;   KIDNEY STONE SURGERY     NEPHRECTOMY, RADICAL, ROBOT ASSISTED, LAPAROSCOPIC Right 11/21/2022   with ureterectomy   TRANSURETHRAL RESECTION OF BLADDER TUMOR N/A 01/20/2024   Procedure: TURBT (TRANSURETHRAL RESECTION OF BLADDER TUMOR);  Surgeon: Riki Altes, MD;  Location: [facility];  Service: Urology;  Laterality: N/A;   URETERAL BIOPSY Right 07/30/2022   Procedure: URETERAL BIOPSY OF RENAL PELVIS;  Surgeon: Riki Altes, MD;  Location: [facility];  Service: Urology;  Laterality: Right;   URETEROSCOPY Right 07/30/2022   Procedure: URETEROSCOPY;  Surgeon: Riki Altes, MD;  Location: [facility];  Service: Urology;  Laterality: Right;     Medications: Outpatient Medications Prior to Visit  Medication Sig   amLODipine (NORVASC) 2.5 MG tablet Take 2.5 mg by mouth every morning.   clopidogrel (PLAVIX) 75 MG tablet Take 75 mg by mouth daily.   folic acid (FOLVITE) 1 MG tablet Take 1 tablet (1 mg total) by mouth daily.   furosemide (LASIX) 20 MG tablet Take 20 mg by mouth daily.   metFORMIN (GLUCOPHAGE) 500 MG tablet Take 500 mg by mouth in the morning and at bedtime.   Multiple Vitamins-Minerals (CENTRUM SILVER 50+MEN) TABS Take 1 tablet by mouth daily.   atorvastatin (LIPITOR) 40 MG tablet Take 40 mg by mouth every morning.   citalopram (CELEXA) 40 MG tablet Take 40 mg by mouth daily.   gabapentin (NEURONTIN) 100 MG tablet Take 3 tablets (300 mg total) by mouth 3 (three) times daily. (Patient taking differently: Take 300 mg by mouth 2 (two) times daily.)   JARDIANCE 25 MG TABS tablet Take 25 mg by mouth daily.   QUEtiapine (SEROQUEL) 300 MG tablet Take 300 mg by mouth at bedtime.   aspirin EC 81 MG EC tablet Take 1 tablet (81 mg total) by mouth daily. Swallow whole. (Patient not taking: Reported on 01/08/2024)   Vibegron (GEMTESA) 75 MG TABS  Take 1 tablet (75 mg total) by mouth daily. (Patient not taking: Reported on 01/08/2024)   No facility-administered medications prior to visit.    Review of Systems  Constitutional:  Negative for appetite change, chills and fever.  Respiratory:  Negative for chest tightness, shortness of breath and wheezing.   Cardiovascular:  Negative for chest pain and palpitations.  Gastrointestinal:  Negative for abdominal pain, nausea and vomiting.       Objective    BP (!) 131/48 (BP Location: Left Arm, Patient Position: Sitting, Cuff Size: Normal)   Pulse 74   Temp 98.6 F (37 C) (Oral)   Resp 16   Ht 5\' 3"  (1.6 m)   Wt 131 lb (59.4 kg)   SpO2 100%   BMI 23.21 kg/m    Physical Exam   General: Appearance:    Well developed, well nourished male in no acute distress  Eyes:    PERRL, conjunctiva/corneas clear, EOM's intact       Lungs:     Clear to auscultation bilaterally, respirations unlabored  Heart:    Normal heart rate. Normal rhythm. No murmurs, rubs, or gallops.    MS:   All extremities are intact.    Neurologic:   Awake, alert, oriented x 3. No apparent focal neurological defect.  Lab Results  Component Value Date   HGBA1C 5.8 (A) 01/12/2024     Assessment & Plan     1. Preop examination (Primary) No absolute or relative contraindications for planned low risk procedure.   2. Bilateral carotid artery stenosis  3. Essential hypertension  4. PAD (peripheral artery disease) (HCC) Currently on clopidogrel and is to hold for 5 days prior to procedure.   5. Type 2 diabetes mellitus with diabetic neuropathy, without long-term current use of insulin (HCC) refill - gabapentin (NEURONTIN) 100 MG tablet; Take 4 tablets (400 mg total) by mouth 2 (two) times daily. (Patient taking differently: Take 200 mg by mouth 2 (two) times daily.)  refill - metFORMIN (GLUCOPHAGE) 500 MG tablet; Take 500 mg by mouth in the morning and at bedtime.         Mila Merry, MD   [facility] [phone] (phone) [phone] (fax)  [facility]

## 2024-01-23 ENCOUNTER — Ambulatory Visit (INDEPENDENT_AMBULATORY_CARE_PROVIDER_SITE_OTHER): Payer: Medicare (Managed Care) | Admitting: Urology

## 2024-01-23 ENCOUNTER — Ambulatory Visit: Payer: Medicare (Managed Care) | Admitting: Physician Assistant

## 2024-01-23 ENCOUNTER — Telehealth: Payer: Self-pay

## 2024-01-23 ENCOUNTER — Other Ambulatory Visit: Admitting: Urology

## 2024-01-23 VITALS — BP 111/69 | HR 96

## 2024-01-23 DIAGNOSIS — Z9889 Other specified postprocedural states: Secondary | ICD-10-CM

## 2024-01-23 LAB — SURGICAL PATHOLOGY

## 2024-01-23 NOTE — Telephone Encounter (Signed)
 Spoke with patient's daughters and patient will come in today for cath removal.

## 2024-01-23 NOTE — Progress Notes (Signed)
Catheter Removal  Patient is present today for a catheter removal.  10ml of water was drained from the balloon. A 18FR foley cath was removed from the bladder, no complications were noted. Patient tolerated well.  Performed by: Randa Lynn, RMA

## 2024-01-23 NOTE — Telephone Encounter (Signed)
-----   Message from [name] sent at 01/23/2024  8:36 AM EDT ----- Regarding: Catheter removal Please have this patient come in sometime today for cath removal.  He does not need a voiding trial.  If he would prefer to keep the catheter in through the weekend he can come in on Monday for removal.  Thanks

## 2024-01-23 NOTE — Progress Notes (Signed)
 01/23/2024 11:15 AM   [name] 09/25/1946 [id number]  Referring provider: Ronnald Ramp, MD [address] [place],  [place] [address]  Chief Complaint  Patient presents with   Follow-up    HPI: [name] is a 78 y.o. male who presents for postop follow-up.  Underwent cystoscopy under anesthesia earlier this week with findings of papillary tumors in the urethra, prostate and 1 solitary bladder tumor all of which were resected.  He had no postprocedural complaints and presents today for catheter removal   PMH: Past Medical History:  Diagnosis Date   Acute right PCA stroke (HCC) 03/12/2018   a.) residual loss of peripheral vision and balance issues   Anemia    Anxiety    Atherosclerosis of aorta (HCC)    Benign prostatic hyperplasia    Bilateral carotid artery stenosis    Cerebral atherosclerosis 03/13/2018   a.) CTA neck 03/13/2018: proximal inferior division RIGHT M2 segment stenosis with normal distal opacification; mod stenosis of the distal RIGHT P2 segment (likely culprit of 03/12/2018 CVA); diffuse small vessel Dz   Cervical spondylosis    Chronic back pain    a.) followed by pain mgmt at [facility]   DDD (degenerative disc disease), lumbar    Depression    Essential hypertension    GERD (gastroesophageal reflux disease)    HCV (hepatitis C virus)    a.) s/p Tx with ledipasvir/sofosbuvir   History of kidney stones    Leg ulcer, left (HCC) 2023   has been seen at the wound clinic   Lesion of BILATERAL parotid glands 03/13/2018   a.) CTA neck 03/13/2018: RIGHT (1.3 x 1.8 x 2.4 cm) and LEFT (1.5 x 0.9 1.2 cm); b.) CT soft tissue neck 04/05/2021: RIGHT (2.3 x 1.6 cm) and LEFT (2.3 x 1.6 cm)   Long term current use of antithrombotics/antiplatelets    a.) daily DAPT therapy (ASA + clopidogrel)   Lumbar radiculitis    Lumbar spondylosis    Mixed hyperlipidemia    Overdose of benzodiazepine    Overuse of medication 02/07/2022   a.)  admitted to [facility] with decreased LOC secondary to overuse of "Delta 8 THC gummies"   Peripheral vascular disease (HCC)    Polysubstance abuse (HCC)    a.) marijuana + cocaine + heroin + BZO   Right-sided Bell's palsy    Type 2 diabetes mellitus without complication, without long-term current use of insulin (HCC)    Unresponsive episode 02/07/2022   Unsteady gait    a.) following CVA in 2019; uses ambulation aid (walker) for balance   Urothelial carcinoma (HCC)    Wears dentures    Has full upper.  Does not wear.    Surgical History: Past Surgical History:  Procedure Laterality Date   ANTERIOR LUMBAR FUSION  05/13/2012   Procedure: ANTERIOR LUMBAR FUSION 1 LEVEL;  Surgeon: Venita Lick, MD;  Location: [facility];  Service: Orthopedics;  Laterality: N/A;  ALIF L5-S1   CATARACT EXTRACTION W/PHACO Left 01/01/2021   Procedure: CATARACT EXTRACTION PHACO AND INTRAOCULAR LENS PLACEMENT (IOC) LEFT DIABETIC 2.28 00:24.7;  Surgeon: Nevada Crane, MD;  Location: [facility];  Service: Ophthalmology;  Laterality: Left;  covid + 11-30-20   CATARACT EXTRACTION W/PHACO Right 01/15/2021   Procedure: CATARACT EXTRACTION PHACO AND INTRAOCULAR LENS PLACEMENT (IOC) RIGHT DIABETIC;  Surgeon: Nevada Crane, MD;  Location: [facility];  Service: Ophthalmology;  Laterality: Right;  2.62 0:34.5   CHOLECYSTECTOMY     COLONOSCOPY  WITH PROPOFOL N/A 09/19/2021   Procedure: COLONOSCOPY WITH PROPOFOL;  Surgeon: Toledo, Boykin Nearing, MD;  Location: [facility];  Service: Gastroenterology;  Laterality: N/A;   CYSTOSCOPY W/ RETROGRADES Left 08/04/2018   Procedure: CYSTOSCOPY WITH RETROGRADE PYELOGRAM;  Surgeon: Riki Altes, MD;  Location: [facility];  Service: Urology;  Laterality: Left;   CYSTOSCOPY W/ RETROGRADES Left 01/20/2024   Procedure: CYSTOSCOPY, WITH RETROGRADE PYELOGRAM;  Surgeon: Riki Altes, MD;  Location: [facility];  Service: Urology;  Laterality: Left;   CYSTOSCOPY W/ URETERAL  STENT PLACEMENT Right 07/30/2022   Procedure: CYSTOSCOPY WITH URETERAL STENT REPLACEMENT;  Surgeon: Riki Altes, MD;  Location: [facility];  Service: Urology;  Laterality: Right;   CYSTOSCOPY WITH BIOPSY N/A 01/20/2024   Procedure: CYSTOSCOPY and Urethral  WITH BIOPSY;  Surgeon: Riki Altes, MD;  Location: [facility];  Service: Urology;  Laterality: N/A;   CYSTOSCOPY WITH INSERTION OF UROLIFT N/A 01/18/2020   Procedure: CYSTOSCOPY WITH INSERTION OF UROLIFT;  Surgeon: Riki Altes, MD;  Location: [facility];  Service: Urology;  Laterality: N/A;   CYSTOSCOPY WITH RETROGRADE PYELOGRAM, URETEROSCOPY AND STENT PLACEMENT Left 06/18/2022   Procedure: CYSTOSCOPY WITH RETROGRADE PYELOGRAM;  Surgeon: Riki Altes, MD;  Location: [facility];  Service: Urology;  Laterality: Left;   CYSTOSCOPY WITH STENT PLACEMENT Left 08/04/2018   Procedure: CYSTOSCOPY WITH STENT PLACEMENT;  Surgeon: Riki Altes, MD;  Location: [facility];  Service: Urology;  Laterality: Left;   CYSTOSCOPY WITH URETEROSCOPY AND STENT PLACEMENT Right 06/18/2022   Procedure: CYSTOSCOPY WITH URETEROSCOPY AND STENT PLACEMENT;  Surgeon: Riki Altes, MD;  Location: [facility];  Service: Urology;  Laterality: Right;   EXTRACORPOREAL SHOCK WAVE LITHOTRIPSY  2012   EXTRACORPOREAL SHOCK WAVE LITHOTRIPSY Left 08/20/2018   Procedure: EXTRACORPOREAL SHOCK WAVE LITHOTRIPSY (ESWL);  Surgeon: Vanna Scotland, MD;  Location: [facility];  Service: Urology;  Laterality: Left; (cancelled)   EXTRACORPOREAL SHOCK WAVE LITHOTRIPSY Left 08/27/2018   Procedure: EXTRACORPOREAL SHOCK WAVE LITHOTRIPSY (ESWL);  Surgeon: Riki Altes, MD;  Location: [facility];  Service: Urology;  Laterality: Left;   EXTRACORPOREAL SHOCK WAVE LITHOTRIPSY Right 10/15/2018   Procedure: EXTRACORPOREAL SHOCK WAVE LITHOTRIPSY (ESWL);  Surgeon: Sondra Come, MD;  Location: [facility];  Service: Urology;  Laterality: Right;   KIDNEY STONE SURGERY     NEPHRECTOMY, RADICAL, ROBOT  ASSISTED, LAPAROSCOPIC Right 11/21/2022   with ureterectomy   TRANSURETHRAL RESECTION OF BLADDER TUMOR N/A 01/20/2024   Procedure: TURBT (TRANSURETHRAL RESECTION OF BLADDER TUMOR);  Surgeon: Riki Altes, MD;  Location: [facility];  Service: Urology;  Laterality: N/A;   URETERAL BIOPSY Right 07/30/2022   Procedure: URETERAL BIOPSY OF RENAL PELVIS;  Surgeon: Riki Altes, MD;  Location: [facility];  Service: Urology;  Laterality: Right;   URETEROSCOPY Right 07/30/2022   Procedure: URETEROSCOPY;  Surgeon: Riki Altes, MD;  Location: [facility];  Service: Urology;  Laterality: Right;    Home Medications:  Allergies as of 01/23/2024   No Known Allergies      Medication List        Accurate as of January 23, 2024 11:15 AM. If you have any questions, ask your nurse or doctor.          acetaminophen 500 MG tablet Commonly known as: TYLENOL Oral   amLODipine 2.5 MG tablet Commonly known as: NORVASC Take 2.5 mg by mouth every morning.   Centrum Silver 50+Men Tabs Take 1 tablet by mouth daily.   citalopram 40 MG tablet  Commonly known as: CELEXA Take 40 mg by mouth daily.   clopidogrel 75 MG tablet Commonly known as: PLAVIX Take 75 mg by mouth daily.   DSS 100 MG Caps Oral   folic acid 1 MG tablet Commonly known as: FOLVITE Take 1 tablet (1 mg total) by mouth daily.   furosemide 20 MG tablet Commonly known as: LASIX Take 20 mg by mouth daily.   gabapentin 100 MG tablet Commonly known as: NEURONTIN Take 4 tablets (400 mg total) by mouth 2 (two) times daily. What changed: how much to take   [employer] Blood Glucose Monito w/Device Kit MISCELLANEOUS   HYDROcodone-acetaminophen 5-325 MG tablet Commonly known as: NORCO/VICODIN Take 1 tablet by mouth every 6 (six) hours as needed for moderate pain (pain score 4-6).   Jardiance 25 MG Tabs tablet Generic drug: empagliflozin Take 1 tablet (25 mg total) by mouth daily.   LORazepam 0.5 MG tablet Commonly known as:  ATIVAN Oral   metFORMIN 500 MG tablet Commonly known as: GLUCOPHAGE Take 500 mg by mouth in the morning and at bedtime.   QUEtiapine 200 MG tablet Commonly known as: SEROQUEL Take 200 mg by mouth at bedtime.   tamsulosin 0.4 MG Caps capsule Commonly known as: FLOMAX Take 0.4 mg by mouth at bedtime.        Allergies: No Known Allergies  Family History: Family History  Problem Relation Age of Onset   Stroke Mother    Diabetes Mother    Lung cancer Father    Diabetes Father     Social History:  reports that he has been smoking cigarettes. He has a 26.5 pack-year smoking history. He has never used smokeless tobacco. He reports that he does not currently use alcohol. He reports that he does not currently use drugs after having used the following drugs: Heroin, Cocaine, and Marijuana.   Physical Exam: BP 111/69   Pulse 96   Constitutional:  Alert and oriented, No acute distress. HEENT: [place] AT Respiratory: Normal respiratory effort, no increased work of breathing. GU: Urine clear   Assessment & Plan:   Intraoperative findings were discussed and that they were consistent with urothelial carcinoma.  We discussed if high-grade BCG would be recommended Pathology report is pending and patient will be contacted when they return   [facility] [address] [place], [place] [address] [phone]

## 2024-01-29 ENCOUNTER — Ambulatory Visit: Payer: Medicare (Managed Care) | Admitting: Family Medicine

## 2024-01-29 ENCOUNTER — Encounter: Payer: Self-pay | Admitting: Family Medicine

## 2024-01-29 VITALS — BP 118/65 | HR 79 | Ht 63.0 in | Wt 135.0 lb

## 2024-01-29 DIAGNOSIS — I10 Essential (primary) hypertension: Secondary | ICD-10-CM

## 2024-01-29 DIAGNOSIS — Z7984 Long term (current) use of oral hypoglycemic drugs: Secondary | ICD-10-CM

## 2024-01-29 DIAGNOSIS — Z8673 Personal history of transient ischemic attack (TIA), and cerebral infarction without residual deficits: Secondary | ICD-10-CM

## 2024-01-29 DIAGNOSIS — I739 Peripheral vascular disease, unspecified: Secondary | ICD-10-CM | POA: Diagnosis not present

## 2024-01-29 DIAGNOSIS — E1140 Type 2 diabetes mellitus with diabetic neuropathy, unspecified: Secondary | ICD-10-CM

## 2024-01-29 MED ORDER — DULOXETINE HCL 30 MG PO CPEP: ORAL_CAPSULE | ORAL | 3 refills | Status: DC

## 2024-01-29 NOTE — Progress Notes (Signed)
 Established patient visit   Patient: [name]   DOB: September 01, 1946   78 y.o. Male  MRN: [id number] Visit Date: 01/29/2024  Today's healthcare provider: Mimi Alt, MD   Chief Complaint  Patient presents with   Follow-up    No new concerns, but requesting something for pain In his feet    Subjective     HPI     Follow-up    Additional comments: No new concerns, but requesting something for pain In his feet       Last edited by Bart Lieu, CMA on 01/29/2024  1:39 PM.       Discussed the use of AI scribe software for clinical note transcription with the patient, who gave verbal consent to proceed.  History of Present Illness [name] is a 78 year old male with diabetes who presents with foot pain and neuropathy. He was referred by podiatry for diabetes-related foot pain.  He experiences persistent foot pain attributed to diabetes, described as throbbing and severe, particularly at night, affecting his sleep. Gabapentin 200 mg twice daily has been ineffective. He has not yet been contacted by pain management for further evaluation. He reports tingling in his hands and feet, which he attributes to diabetic neuropathy. His A1c levels are reportedly well-controlled.  He experiences significant back pain, which he attributes to pinched nerves. He has been advised that surgery may be dangerous due to the need for extensive procedures. He has a history of back surgery and has previously used tramadol and muscle relaxers, which provided pain relief. However, he has concerns about the use of narcotics due to a history of substance use.  He has a history of seizures, having experienced approximately ten episodes in the past. The most recent episode occurred about a month and a half ago. He is scheduled for a neurology follow-up in May. No recent seizures in the past month and a half.  He has a history of bladder tumors, which were identified by a  urologist. He is awaiting further evaluation and follow-up with the urologist in the coming weeks. He reports that the tumors are causing him pain.  He has a history of substance use dating back to the 1960s and has experienced issues with narcotic medications in the past, including oxycodone. His daughter manages his medications to prevent misuse.   Past Medical History:  Diagnosis Date   Acute right PCA stroke (HCC) 03/12/2018   a.) residual loss of peripheral vision and balance issues   Anemia    Anxiety    Atherosclerosis of aorta (HCC)    Benign prostatic hyperplasia    Bilateral carotid artery stenosis    Cerebral atherosclerosis 03/13/2018   a.) CTA neck 03/13/2018: proximal inferior division RIGHT M2 segment stenosis with normal distal opacification; mod stenosis of the distal RIGHT P2 segment (likely culprit of 03/12/2018 CVA); diffuse small vessel Dz   Cervical spondylosis    Chronic back pain    a.) followed by pain mgmt at [facility]   DDD (degenerative disc disease), lumbar    Depression    Essential hypertension    GERD (gastroesophageal reflux disease)    HCV (hepatitis C virus)    a.) s/p Tx with ledipasvir/sofosbuvir   History of kidney stones    Leg ulcer, left (HCC) 2023   has been seen at the wound clinic   Lesion of BILATERAL parotid glands 03/13/2018   a.) CTA neck 03/13/2018: RIGHT (1.3 x 1.8 x  2.4 cm) and LEFT (1.5 x 0.9 1.2 cm); b.) CT soft tissue neck 04/05/2021: RIGHT (2.3 x 1.6 cm) and LEFT (2.3 x 1.6 cm)   Long term current use of antithrombotics/antiplatelets    a.) daily DAPT therapy (ASA + clopidogrel)   Lumbar radiculitis    Lumbar spondylosis    Mixed hyperlipidemia    Overdose of benzodiazepine    Overuse of medication 02/07/2022   a.) admitted to [facility] with decreased LOC secondary to overuse of "Delta 8 THC gummies"   Peripheral vascular disease (HCC)    Polysubstance abuse (HCC)    a.) marijuana + cocaine + heroin + BZO   Right-sided Bell's palsy     Type 2 diabetes mellitus without complication, without long-term current use of insulin (HCC)    Unresponsive episode 02/07/2022   Unsteady gait    a.) following CVA in 2019; uses ambulation aid (walker) for balance   Urothelial carcinoma (HCC)    Wears dentures    Has full upper.  Does not wear.    Medications: Outpatient Medications Prior to Visit  Medication Sig   acetaminophen (TYLENOL) 500 MG tablet Oral   amLODipine (NORVASC) 2.5 MG tablet Take 2.5 mg by mouth every morning.   Blood Glucose Monitoring Suppl (HEALTHPRO BLOOD GLUCOSE MONITO) w/Device KIT MISCELLANEOUS   citalopram (CELEXA) 40 MG tablet Take 40 mg by mouth daily.   clopidogrel (PLAVIX) 75 MG tablet Take 75 mg by mouth daily.   Docusate Sodium (DSS) 100 MG CAPS Oral   folic acid (FOLVITE) 1 MG tablet Take 1 tablet (1 mg total) by mouth daily.   furosemide (LASIX) 20 MG tablet Take 20 mg by mouth daily.   gabapentin (NEURONTIN) 100 MG tablet Take 4 tablets (400 mg total) by mouth 2 (two) times daily. (Patient taking differently: Take 200 mg by mouth 2 (two) times daily.)   HYDROcodone-acetaminophen (NORCO/VICODIN) 5-325 MG tablet Take 1 tablet by mouth every 6 (six) hours as needed for moderate pain (pain score 4-6).   JARDIANCE 25 MG TABS tablet Take 1 tablet (25 mg total) by mouth daily.   LORazepam (ATIVAN) 0.5 MG tablet Oral   metFORMIN (GLUCOPHAGE) 500 MG tablet Take 500 mg by mouth in the morning and at bedtime.   Multiple Vitamins-Minerals (CENTRUM SILVER 50+MEN) TABS Take 1 tablet by mouth daily.   QUEtiapine (SEROQUEL) 200 MG tablet Take 200 mg by mouth at bedtime.   tamsulosin (FLOMAX) 0.4 MG CAPS capsule Take 0.4 mg by mouth at bedtime.   No facility-administered medications prior to visit.    Review of Systems  Last hemoglobin A1c Lab Results  Component Value Date   HGBA1C 5.8 (A) 01/12/2024        Objective    BP 118/65   Pulse 79   Ht 5\' 3"  (1.6 m)   Wt 135 lb (61.2 kg)   SpO2 100%    BMI 23.91 kg/m  BP Readings from Last 3 Encounters:  01/29/24 118/65  01/23/24 111/69  01/20/24 (!) 141/70   Wt Readings from Last 3 Encounters:  01/29/24 135 lb (61.2 kg)  01/20/24 135 lb (61.2 kg)  01/13/24 135 lb (61.2 kg)        Physical Exam  General: Alert, no acute distress, sitting in transport chair, holding cane  Cardio: Normal S1 and S2, RRR, no r/m/g Pulm: CTAB, normal work of breathing ZOX:WRUEAV BS  Extremities: no LE edema    No results found for any visits on 01/29/24.  Assessment &  Plan     Problem List Items Addressed This Visit       Cardiovascular and Mediastinum   PAD (peripheral artery disease) (HCC) - Primary   Essential hypertension     Endocrine   Type 2 diabetes mellitus with diabetic neuropathy, without long-term current use of insulin (HCC)   Assessment & Plan Diabetic Neuropathy Chronic neuropathic pain in the feet, likely secondary to diabetes. Gabapentin has been ineffective. He reports severe, throbbing pain at night, affecting sleep. Tramadol was effective but is contraindicated due to seizures. Cymbalta was discussed as an alternative for neuropathic pain and its dual role in managing anxiety and depression. - Prescribe Cymbalta (duloxetine) 30 mg daily for one week, then increase to 60 mg daily - Discontinue gabapentin - Follow up in six weeks to assess the effectiveness of Cymbalta  Peripheral Arterial Disease (PAD) Referred to vascular surgery for evaluation of circulation issues in the legs, which may be contributing to the foot pain. He has not yet been seen by vascular surgery. - Ensure follow-up with vascular surgery for evaluation of peripheral arterial disease  Back Pain Chronic back pain with pinched nerves. Surgery was deemed too risky due to potential need for extensive intervention. He is scheduled to see an orthopedic specialist for further evaluation. Pain management was recommended as an alternative to surgery. -  Attend orthopedic appointment on April 23 for back pain evaluation - Follow up with pain management for potential interventions  Bladder Tumors Reports tumors around the bladder, with a follow-up appointment with urology pending. Awaiting further evaluation and management from the urologist. - Attend follow-up appointment with urology for further evaluation of bladder tumors  Seizure-like Episodes Reports seizure-like episodes, with a recent episode about a month ago. Scheduled for a brain scan to evaluate these episodes. Tramadol is contraindicated due to the potential to lower seizure threshold. - Await results of the brain scan scheduled by neurology - Follow up with neurology in May  General Health Maintenance A1c is well controlled, indicating good management of diabetes. Unable to provide a urine sample for albumin levels during this visit. - Attempt urine sample for albumin levels at the next visit     Return in about 6 weeks (around 03/11/2024) for f/u neuropathy .         Mimi Alt, MD  [facility] [phone] (phone) [phone] (fax)  [facility]

## 2024-01-29 NOTE — Patient Instructions (Signed)
 VISIT SUMMARY:  Today, we discussed your ongoing issues with foot pain, back pain, bladder tumors, and seizure-like episodes. We reviewed your current medications and made some adjustments to better manage your symptoms. We also talked about your upcoming appointments with various specialists to further evaluate and manage your conditions.  YOUR PLAN:  -DIABETIC NEUROPATHY: Diabetic neuropathy is nerve damage caused by diabetes, leading to pain and tingling in the feet and hands. Since gabapentin was ineffective, we are switching you to Cymbalta (duloxetine) 30 mg daily for one week, then increasing to 60 mg daily. Please discontinue gabapentin and follow up in six weeks to assess the effectiveness of Cymbalta.  -PERIPHERAL ARTERIAL DISEASE (PAD): Peripheral arterial disease is a condition where the blood vessels in the legs are narrowed, reducing blood flow and causing pain. You have been referred to vascular surgery for evaluation. Please ensure you follow up with them for further assessment.  -BACK PAIN: Chronic back pain can be due to various reasons, including pinched nerves. Surgery is not recommended due to the risks involved. You are scheduled to see an orthopedic specialist on April 23 for further evaluation. Additionally, follow up with pain management for potential interventions.   -GENERAL HEALTH MAINTENANCE: Your A1c levels are well controlled, indicating good management of your diabetes. We were unable to get a urine sample for albumin levels today, so please try to provide one at your next visit.  INSTRUCTIONS:  1. Follow up in six weeks to assess the effectiveness of Cymbalta for diabetic neuropathy. 2. Ensure follow-up with vascular surgery for evaluation of peripheral arterial disease. 3. Attend orthopedic appointment on April 23 for back pain evaluation. 4. Follow up with pain management for potential interventions for back pain. 5. Attend follow-up appointment with urology  for further evaluation of bladder tumors. 6. Await results of the brain scan and follow up with neurology in May. 7. Attempt to provide a urine sample for albumin levels at your next visit.

## 2024-02-02 ENCOUNTER — Telehealth: Payer: Self-pay | Admitting: Family Medicine

## 2024-02-02 ENCOUNTER — Ambulatory Visit: Payer: Medicare (Managed Care) | Admitting: Physician Assistant

## 2024-02-02 ENCOUNTER — Ambulatory Visit: Payer: Self-pay

## 2024-02-02 NOTE — Telephone Encounter (Unsigned)
 Copied from CRM [id number]. Topic: Clinical - Red Word Triage >> Feb 02, 2024  8:17 AM Annett Bart wrote: Red Word that prompted transfer to Nurse Triage: ***Patient is having pain in his foot that has gotten worse than before from seeing his doctor on 01/29/2024. And he feels like the medication that was prescribed to him is not working.

## 2024-02-02 NOTE — Telephone Encounter (Addendum)
 Per chart review, pt has already been triaged. Nothing further needed at this time.  Copied from CRM [id number]. Topic: Clinical - Red Word Triage >> Feb 02, 2024  8:17 AM Annett Bart wrote: Red Word that prompted transfer to Nurse Triage:Patient is having pain in his foot that has gotten worse than before from seeing his doctor on 01/29/2024. And he feels like the medication that was prescribed to him is not working.

## 2024-02-02 NOTE — Telephone Encounter (Signed)
 Please advise if this pt needs to be seen in clinic to discuss this requested medication

## 2024-02-02 NOTE — Telephone Encounter (Signed)
 Patient was seen in office on 01/29/24. Patient was prescribed Cymbalta for neuropathy. Patient stated he is still experiencing pain and tingling in bilateral feet. Patient is requesting a different medication to be prescribed for relief. Please advise.   Reason for Disposition  Prescription request for new medicine (not a refill)  Protocols used: Medication Refill and Renewal Call-A-AH

## 2024-02-03 NOTE — Telephone Encounter (Signed)
 Please advise patient to give the full 2 weeks of taking the 30mg  dose and then increasing to 60mg  dose.   If no improvement, I would recommend a pain management referral to discuss additional options for symptoms

## 2024-02-03 NOTE — Telephone Encounter (Signed)
 Attempted to contact the pt but was only able to leave a voice message asking her to call the office back regarding her lab results per Dr Verdia Glad

## 2024-02-04 ENCOUNTER — Ambulatory Visit: Payer: Self-pay

## 2024-02-04 NOTE — Telephone Encounter (Signed)
 An attempt was made to contact the patient regarding to a response message from Dr Verdia Glad; however, the voicemail box was full, preventing a message from being left.

## 2024-02-04 NOTE — Telephone Encounter (Signed)
 Per note from doctor on 4/15 at 0730 pt is to complete the first weeks on cymbalta, pt was advised of doctor notation. Pt states that he understands.  No further questions/concerns at this time.   Copied from CRM [id number]. Topic: Clinical - Medication Question >> Feb 04, 2024  1:41 PM Yolanda T wrote: Reason for CRM: patient called to see when the provider would be sending in the script for the pain in his foot. Please f/u with patient Reason for Disposition . Health Information question, no triage required and triager able to answer question  Protocols used: Information Only Call - No Triage-A-AH

## 2024-02-04 NOTE — Telephone Encounter (Signed)
FYI please see the message below.

## 2024-02-09 ENCOUNTER — Telehealth: Payer: Self-pay

## 2024-02-09 ENCOUNTER — Encounter: Payer: Self-pay | Admitting: Urology

## 2024-02-09 NOTE — Telephone Encounter (Signed)
 Pt is requesting a call back to discuss his path report from 4/1. Daughter sent my chart message but pr prefers a call.

## 2024-02-10 ENCOUNTER — Telehealth: Payer: Self-pay

## 2024-02-10 DIAGNOSIS — C689 Malignant neoplasm of urinary organ, unspecified: Secondary | ICD-10-CM

## 2024-02-10 NOTE — Telephone Encounter (Signed)
 Pt's daughter LM on triage line requesting results from biopsy for her father. Please advise.

## 2024-02-10 NOTE — Telephone Encounter (Signed)
 Patients daughter called regarding biopsy results.  Please advise.

## 2024-02-10 NOTE — Telephone Encounter (Signed)
 I contacted patient's daughter, [name] who is on his DPR discusses pathology report.  History of high-grade urothelial carcinoma right upper tract status post nephro-ureterectomy at [facility] 11/2022.  Saw one of our PAs early March 2025 with gross hematuria.  The requested cystoscopy under anesthesia with findings of a solitary papillary bladder tumor however marked tumor involvement of the prostatic urethra and 3 papillary tumors in the penile urethra.  All tumors were removed/resected with pathology of high-grade urothelial carcinoma without evidence of muscle invasion.  Deeper resection of prostatic urethra was performed though the pathologist did not comment on involvement of prostatic glands.  The pathology report was discussed with his daughter.  I recommended follow-up with [facility] regarding further treatment.  Unlikely BCG would provide enough contact time for the prostatic/penile urethra.  She was in agreement with the [facility] appointment.

## 2024-02-12 ENCOUNTER — Ambulatory Visit: Payer: Self-pay

## 2024-02-12 NOTE — Telephone Encounter (Signed)
 Please schedule patient for next available appt with available provider to discuss pain options

## 2024-02-12 NOTE — Telephone Encounter (Signed)
 Can someone please reach out to offer this pt an appt per Dr Ardeth Beckers

## 2024-02-12 NOTE — Telephone Encounter (Signed)
 Chief Complaint: LBP, radiating, neuropathy in feet Symptoms: severe pain Frequency: Worsening x 1 month Pertinent Negatives: Patient denies numbness, fever, abd pain Disposition: [] ED /[] Urgent Care (no appt availability in office) / [x] Appointment(In office/virtual)/ []  [facility]/ [] Home Care/ [] Refused Recommended Disposition /[] [facility]/ []  Follow-up with PCP Additional Notes: Pt reports he has been experiencing worsening LBP that radiates down both legs to his feet, he also notes worsening neuropathy in his feet at 10/10 makes it painful for pt to walk. Pt unable to come in for OV/UC due to transportation and declines VV as he is not familiar with how to do them. Pt requesting appt early next week due to transporation needs. Pt also requests provider send script to pharmacy for pain. Pharmacy confirmed, advised appt still needed. This RN educated pt on home care, new-worsening symptoms, when to call back/seek emergent care. Pt verbalized understanding and agrees to plan.     Copied from CRM [id number]. Topic: Clinical - Red Word Triage >> Feb 12, 2024  9:55 AM Star East wrote: Red Word that prompted transfer to Nurse Triage: pain in feet and lower back - level 10 pain due to neuropathy Reason for Disposition  [1] SEVERE back pain (e.g., excruciating, unable to do any normal activities) AND [2] not improved 2 hours after pain medicine  Answer Assessment - Initial Assessment Questions 1. ONSET: "When did the pain begin?"      Began worsening last month 2. LOCATION: "Where does it hurt?" (upper, mid or lower back)     Low back, bilateral legs, feet 3. SEVERITY: "How bad is the pain?"  (e.g., Scale 1-10; mild, moderate, or severe)   - MILD (1-3): Doesn't interfere with normal activities.    - MODERATE (4-7): Interferes with normal activities or awakens from sleep.    - SEVERE (8-10): Excruciating pain, unable to do any normal activities.      10/10 4. PATTERN:  "Is the pain constant?" (e.g., yes, no; constant, intermittent)      Constant 5. RADIATION: "Does the pain shoot into your legs or somewhere else?"     Radiates down bilateral legs 6. CAUSE:  "What do you think is causing the back pain?"      Pinched nerve, neuropathy  8. MEDICINES: "What have you taken so far for the pain?" (e.g., nothing, acetaminophen , NSAIDS)     Advil , Cymbalta , little to no improvement 9. NEUROLOGIC SYMPTOMS: "Do you have any weakness, numbness, or problems with bowel/bladder control?"     Weakness, pt notes frequent falls, yesterday was most recent fall 10. OTHER SYMPTOMS: "Do you have any other symptoms?" (e.g., fever, abdomen pain, burning with urination, blood in urine)       Blood in urine, pt reports he is being followed by urology and they are aware  Protocols used: Back Pain-A-AH

## 2024-02-12 NOTE — Telephone Encounter (Signed)
 Called patient to try and set up an appt got no answer and voicemail was full. Sent mychart message instead asking him to call the office and set up and appt

## 2024-02-16 ENCOUNTER — Ambulatory Visit: Payer: Medicare (Managed Care) | Admitting: Family Medicine

## 2024-02-20 NOTE — Progress Notes (Unsigned)
 No show

## 2024-02-24 ENCOUNTER — Ambulatory Visit (INDEPENDENT_AMBULATORY_CARE_PROVIDER_SITE_OTHER): Payer: Medicare (Managed Care) | Admitting: Psychiatry

## 2024-02-24 DIAGNOSIS — Z91199 Patient's noncompliance with other medical treatment and regimen due to unspecified reason: Secondary | ICD-10-CM

## 2024-02-25 ENCOUNTER — Other Ambulatory Visit: Payer: Self-pay | Admitting: Urology

## 2024-03-09 ENCOUNTER — Other Ambulatory Visit (INDEPENDENT_AMBULATORY_CARE_PROVIDER_SITE_OTHER): Payer: Self-pay | Admitting: Nurse Practitioner

## 2024-03-09 ENCOUNTER — Encounter (INDEPENDENT_AMBULATORY_CARE_PROVIDER_SITE_OTHER): Payer: Self-pay

## 2024-03-09 DIAGNOSIS — I739 Peripheral vascular disease, unspecified: Secondary | ICD-10-CM

## 2024-03-10 ENCOUNTER — Other Ambulatory Visit: Payer: Self-pay

## 2024-03-10 ENCOUNTER — Encounter: Payer: Self-pay | Admitting: Family Medicine

## 2024-03-10 DIAGNOSIS — E1140 Type 2 diabetes mellitus with diabetic neuropathy, unspecified: Secondary | ICD-10-CM

## 2024-03-11 ENCOUNTER — Ambulatory Visit (INDEPENDENT_AMBULATORY_CARE_PROVIDER_SITE_OTHER): Payer: Medicare (Managed Care) | Admitting: Nurse Practitioner

## 2024-03-11 ENCOUNTER — Other Ambulatory Visit: Payer: Self-pay | Admitting: Family Medicine

## 2024-03-11 ENCOUNTER — Ambulatory Visit (INDEPENDENT_AMBULATORY_CARE_PROVIDER_SITE_OTHER): Payer: Medicare (Managed Care)

## 2024-03-11 ENCOUNTER — Ambulatory Visit: Payer: Medicare (Managed Care) | Admitting: Family Medicine

## 2024-03-11 ENCOUNTER — Encounter: Payer: Self-pay | Admitting: Family Medicine

## 2024-03-11 ENCOUNTER — Encounter (INDEPENDENT_AMBULATORY_CARE_PROVIDER_SITE_OTHER): Payer: Self-pay | Admitting: Nurse Practitioner

## 2024-03-11 VITALS — BP 138/74 | HR 81 | Ht 63.0 in | Wt 125.0 lb

## 2024-03-11 VITALS — BP 121/77 | HR 85 | Resp 16 | Ht 63.0 in | Wt 128.0 lb

## 2024-03-11 DIAGNOSIS — M5136 Other intervertebral disc degeneration, lumbar region with discogenic back pain only: Secondary | ICD-10-CM

## 2024-03-11 DIAGNOSIS — M5417 Radiculopathy, lumbosacral region: Secondary | ICD-10-CM

## 2024-03-11 DIAGNOSIS — I739 Peripheral vascular disease, unspecified: Secondary | ICD-10-CM

## 2024-03-11 DIAGNOSIS — M47816 Spondylosis without myelopathy or radiculopathy, lumbar region: Secondary | ICD-10-CM

## 2024-03-11 DIAGNOSIS — E1140 Type 2 diabetes mellitus with diabetic neuropathy, unspecified: Secondary | ICD-10-CM

## 2024-03-11 DIAGNOSIS — F1910 Other psychoactive substance abuse, uncomplicated: Secondary | ICD-10-CM | POA: Diagnosis not present

## 2024-03-11 DIAGNOSIS — I70223 Atherosclerosis of native arteries of extremities with rest pain, bilateral legs: Secondary | ICD-10-CM

## 2024-03-11 DIAGNOSIS — I10 Essential (primary) hypertension: Secondary | ICD-10-CM

## 2024-03-11 MED ORDER — PREGABALIN 75 MG PO CAPS
75.0000 mg | ORAL_CAPSULE | Freq: Two times a day (BID) | ORAL | 0 refills | Status: DC
Start: 2024-03-11 — End: 2024-08-10

## 2024-03-11 MED ORDER — METFORMIN HCL 500 MG PO TABS: 500.0000 mg | ORAL_TABLET | Freq: Two times a day (BID) | ORAL | 2 refills | Status: DC

## 2024-03-11 MED ORDER — DULOXETINE HCL 60 MG PO CPEP: 60.0000 mg | ORAL_CAPSULE | Freq: Every day | ORAL | 2 refills | Status: AC

## 2024-03-11 MED ORDER — TIZANIDINE HCL 4 MG PO CAPS: 4.0000 mg | ORAL_CAPSULE | Freq: Three times a day (TID) | ORAL | 1 refills | Status: DC

## 2024-03-11 MED ORDER — GABAPENTIN 100 MG PO TABS: 400.0000 mg | ORAL_TABLET | Freq: Two times a day (BID) | ORAL | 3 refills | Status: DC

## 2024-03-11 MED ORDER — GABAPENTIN 400 MG PO CAPS: 400.0000 mg | ORAL_CAPSULE | Freq: Two times a day (BID) | ORAL | 3 refills | Status: DC

## 2024-03-11 MED ORDER — FOLIC ACID 1 MG PO TABS: 1.0000 mg | ORAL_TABLET | Freq: Every day | ORAL | 3 refills | Status: DC

## 2024-03-11 MED ORDER — METFORMIN HCL 500 MG PO TABS: 500.0000 mg | ORAL_TABLET | Freq: Two times a day (BID) | ORAL | 2 refills | Status: AC

## 2024-03-11 MED ORDER — FOLIC ACID 1 MG PO TABS: 1.0000 mg | ORAL_TABLET | Freq: Every day | ORAL | 0 refills | Status: DC

## 2024-03-11 MED ORDER — DULOXETINE HCL 30 MG PO CPEP: ORAL_CAPSULE | ORAL | 3 refills | Status: DC

## 2024-03-11 NOTE — Progress Notes (Signed)
 Established patient visit   Patient: [name]   DOB: 07-Oct-1946   78 y.o. Male  MRN: [id number] Visit Date: 03/11/2024  Today's healthcare provider: Mimi Alt, MD   Chief Complaint  Patient presents with   Peripheral Neuropathy    Feet hurt bad, he can't sleep    Subjective     HPI     Peripheral Neuropathy    Additional comments: Feet hurt bad, he can't sleep       Last edited by Bart Lieu, CMA on 03/11/2024  1:53 PM.       Discussed the use of AI scribe software for clinical note transcription with the patient, who gave verbal consent to proceed.  History of Present Illness [name] is a 78 year old male with bilateral lower extremity neuropathy secondary to diabetes who presents for follow-up.  He experiences severe bilateral lower extremity neuropathy, which is particularly painful at night and affects his sleep. He has a history of poor circulation in his legs, with recent evaluations indicating significant circulatory issues. A vein specialist mentioned that the pain could be related to his veins, pinched nerves in his back, or neuropathy.  He has not been taking his metformin and has had issues with medication management, including not refilling his prescriptions for over two weeks. He has been receiving narcotics, specifically Zanaflex , which he finds helpful. However, there have been challenges in coordinating his medications between different pharmacies and doctors.  He has a history of substance use, including heroin addiction in the past, and has been in and out of rehab. This history impacts his current management of pain medications, as there is concern about dependency on stronger narcotics.  He has a single kidney and is scheduled for cancer surgery on the remaining kidney. There is concern about the coordination of care among his various healthcare providers, given his complex medical history.     Past Medical  History:  Diagnosis Date   Acute right PCA stroke (HCC) 03/12/2018   a.) residual loss of peripheral vision and balance issues   Anemia    Anxiety    Atherosclerosis of aorta (HCC)    Benign prostatic hyperplasia    Bilateral carotid artery stenosis    Cerebral atherosclerosis 03/13/2018   a.) CTA neck 03/13/2018: proximal inferior division RIGHT M2 segment stenosis with normal distal opacification; mod stenosis of the distal RIGHT P2 segment (likely culprit of 03/12/2018 CVA); diffuse small vessel Dz   Cervical spondylosis    Chronic back pain    a.) followed by pain mgmt at [facility]   DDD (degenerative disc disease), lumbar    Depression    Essential hypertension    GERD (gastroesophageal reflux disease)    HCV (hepatitis C virus)    a.) s/p Tx with ledipasvir/sofosbuvir   History of kidney stones    Leg ulcer, left (HCC) 2023   has been seen at the wound clinic   Lesion of BILATERAL parotid glands 03/13/2018   a.) CTA neck 03/13/2018: RIGHT (1.3 x 1.8 x 2.4 cm) and LEFT (1.5 x 0.9 1.2 cm); b.) CT soft tissue neck 04/05/2021: RIGHT (2.3 x 1.6 cm) and LEFT (2.3 x 1.6 cm)   Long term current use of antithrombotics/antiplatelets    a.) daily DAPT therapy (ASA + clopidogrel )   Lumbar radiculitis    Lumbar spondylosis    Mixed hyperlipidemia    Overdose of benzodiazepine    Overuse of medication 02/07/2022  a.) admitted to [facility] with decreased LOC secondary to overuse of "Delta 8 THC gummies"   Peripheral vascular disease (HCC)    Polysubstance abuse (HCC)    a.) marijuana + cocaine + heroin + BZO   Right-sided Bell's palsy    Type 2 diabetes mellitus without complication, without long-term current use of insulin (HCC)    Unresponsive episode 02/07/2022   Unsteady gait    a.) following CVA in 2019; uses ambulation aid (walker) for balance   Urothelial carcinoma (HCC)    Wears dentures    Has full upper.  Does not wear.    Medications: Outpatient Medications Prior to Visit   Medication Sig   acetaminophen  (TYLENOL ) 500 MG tablet Oral   amLODipine  (NORVASC ) 2.5 MG tablet Take 2.5 mg by mouth every morning.   Blood Glucose Monitoring Suppl (HEALTHPRO BLOOD GLUCOSE MONITO) w/Device KIT MISCELLANEOUS   citalopram  (CELEXA ) 40 MG tablet Take 40 mg by mouth daily.   clopidogrel  (PLAVIX ) 75 MG tablet Take 75 mg by mouth daily.   Docusate Sodium  (DSS) 100 MG CAPS Oral   furosemide  (LASIX ) 20 MG tablet Take 20 mg by mouth daily.   JARDIANCE  25 MG TABS tablet Take 1 tablet (25 mg total) by mouth daily.   QUEtiapine  (SEROQUEL ) 200 MG tablet Take 200 mg by mouth at bedtime.   tamsulosin  (FLOMAX ) 0.4 MG CAPS capsule Take 0.4 mg by mouth at bedtime.   [DISCONTINUED] DULoxetine  (CYMBALTA ) 30 MG capsule Take 1 capsule (30 mg total) by mouth daily for 7 days, THEN 2 capsules (60 mg total) daily.   [DISCONTINUED] folic acid  (FOLVITE ) 1 MG tablet Take 1 tablet (1 mg total) by mouth daily.   [DISCONTINUED] gabapentin  (NEURONTIN ) 100 MG tablet Take 4 tablets (400 mg total) by mouth 2 (two) times daily. (Patient taking differently: Take 300 mg by mouth 2 (two) times daily.)   [DISCONTINUED] metFORMIN (GLUCOPHAGE) 500 MG tablet Take 500 mg by mouth in the morning and at bedtime.   HYDROcodone -acetaminophen  (NORCO/VICODIN) 5-325 MG tablet Take 1 tablet by mouth every 6 (six) hours as needed for moderate pain (pain score 4-6).   LORazepam  (ATIVAN ) 0.5 MG tablet Oral   Multiple Vitamins-Minerals (CENTRUM SILVER 50+MEN) TABS Take 1 tablet by mouth daily. (Patient not taking: Reported on 03/11/2024)   No facility-administered medications prior to visit.    Review of Systems  Last CBC Lab Results  Component Value Date   WBC 11.5 (H) 01/13/2024   HGB 15.4 01/13/2024   HCT 46.1 01/13/2024   MCV 90.4 01/13/2024   MCH 30.2 01/13/2024   RDW 16.8 (H) 01/13/2024   PLT 254 01/13/2024   Last metabolic panel Lab Results  Component Value Date   GLUCOSE 102 (H) 01/13/2024   NA 137  01/13/2024   K 4.1 01/13/2024   CL 100 01/13/2024   CO2 26 01/13/2024   BUN 28 (H) 01/13/2024   CREATININE 1.33 (H) 01/13/2024   GFRNONAA 55 (L) 01/13/2024   CALCIUM  10.0 01/13/2024   PHOS 3.2 10/10/2020   PROT 8.6 (H) 08/31/2022   ALBUMIN 4.4 08/31/2022   BILITOT 0.6 08/31/2022   ALKPHOS 85 08/31/2022   AST 22 08/31/2022   ALT 21 08/31/2022   ANIONGAP 11 01/13/2024   Last lipids Lab Results  Component Value Date   CHOL 200 03/13/2018   HDL 20 (L) 03/13/2018   LDLCALC 131 (H) 03/13/2018   TRIG 247 (H) 03/13/2018   CHOLHDL 10.0 03/13/2018   Last hemoglobin A1c Lab Results  Component Value Date   HGBA1C 5.8 (A) 01/12/2024   Last thyroid  functions Lab Results  Component Value Date   TSH 4.589 (H) 10/14/2020   Last vitamin D No results found for: "25OHVITD2", "25OHVITD3", "VD25OH" Last vitamin B12 and Folate Lab Results  Component Value Date   VITAMINB12 293 10/14/2020        Objective    BP 138/74   Pulse 81   Ht 5\' 3"  (1.6 m)   Wt 125 lb (56.7 kg)   SpO2 100%   BMI 22.14 kg/m  BP Readings from Last 3 Encounters:  03/11/24 138/74  03/11/24 121/77  01/29/24 118/65   Wt Readings from Last 3 Encounters:  03/11/24 125 lb (56.7 kg)  03/11/24 128 lb (58.1 kg)  01/29/24 135 lb (61.2 kg)        Physical Exam Constitutional:      Comments: Elderly male seated in transport chair in NAD   Musculoskeletal:        General: No swelling, tenderness, deformity or signs of injury.     Right lower leg: No edema.     Left lower leg: No edema.       No results found for any visits on 03/11/24.  Assessment & Plan     Problem List Items Addressed This Visit       Cardiovascular and Mediastinum   PAD (peripheral artery disease) (HCC) - Primary     Endocrine   Type 2 diabetes mellitus with diabetic neuropathy, without long-term current use of insulin (HCC)   Relevant Medications   gabapentin  (NEURONTIN ) 100 MG tablet   metFORMIN (GLUCOPHAGE) 500 MG  tablet   Other Relevant Orders   Urine microalbumin-creatinine with uACR     Musculoskeletal and Integument   Discogenic low back pain   Relevant Medications   DULoxetine  (CYMBALTA ) 30 MG capsule   folic acid  (FOLVITE ) 1 MG tablet   gabapentin  (NEURONTIN ) 100 MG tablet     Other   Polysubstance abuse (HCC)     Assessment & Plan Peripheral vascular disease Severe peripheral vascular disease with poor circulation in the legs, contributing to leg pain. Circulation measurement is 0.5, below the normal range of 0.65 to 0.95. - Proceed with scheduled vascular procedures, including balloon angioplasty. - Follow up with vascular specialist post-procedure to assess improvement in circulation and pain.  Diabetic neuropathy Bilateral lower extremity neuropathy secondary to diabetes, causing significant nocturnal pain and affecting sleep. Current gabapentin  treatment is ineffective. Differential diagnosis includes vascular issues and pinched nerves in the back. - Prescribe Lyrica 75mg  as an alternative to gabapentin . - DC gabapentin  - refill zanaflex  2-3 times per day  - Continue f/u with vascular  - Monitor response to Lyrica and vascular interventions.   Substance abuse Heroin addiction with past rehabilitation. Risk of relapse with narcotic use for pain management necessitates avoidance of narcotics like Norco and Percocet. - Avoid prescribing narcotics for pain management. - Discuss risks of narcotic use and explore non-narcotic pain management options.     No follow-ups on file.         Mimi Alt, MD  [facility] [phone] (phone) [phone] (fax)  [facility]

## 2024-03-12 ENCOUNTER — Ambulatory Visit: Payer: Self-pay | Admitting: Family Medicine

## 2024-03-12 LAB — MICROALBUMIN / CREATININE URINE RATIO
Creatinine, Urine: 117.6 mg/dL
Microalb/Creat Ratio: 113 mg/g{creat} — ABNORMAL HIGH (ref 0–29)
Microalbumin, Urine: 132.4 ug/mL

## 2024-03-15 ENCOUNTER — Encounter (INDEPENDENT_AMBULATORY_CARE_PROVIDER_SITE_OTHER): Payer: Self-pay | Admitting: Nurse Practitioner

## 2024-03-15 NOTE — Progress Notes (Signed)
 Subjective:    Patient ID: [name], male    DOB: 1946/03/13, 78 y.o.   MRN: [id number] Chief Complaint  Patient presents with   New Patient (Initial Visit)    Ref Simmons-Robinson consult PAD    Pantelis Elgersma is a 78 year old male who presents today as a referral from his primary care provider in regards to evaluation for peripheral arterial disease.  We have previously seen the patient in 2018.  He notes that his legs feel heavy and stiff and they become painful which is worse at night.  This has been ongoing for the last 6 or 7 months.  He does have neuropathy and he notes that that neuropathy is worse at night as well.  He also has significant lower back issues.  Currently has no open wounds or ulcerations.  He does not walk significantly and so he is not able to describe claudication.  His symptoms are concerning for rest pain however.  Today he has an ABI 0.68 on the right and 0.63 on the left.  He has previous ABIs in 2018 of 0.66 on the right and 0.53 on the left.  He appears to have waveforms that appear to be more monophasic in nature with dampened toe waveforms on the right and stronger on the left.    Review of Systems  Neurological:  Positive for weakness and numbness.  All other systems reviewed and are negative.      Objective:    Physical Exam Vitals reviewed.  HENT:     Head: Normocephalic.  Cardiovascular:     Rate and Rhythm: Normal rate.     Pulses:          Dorsalis pedis pulses are detected w/ Doppler on the right side and detected w/ Doppler on the left side.       Posterior tibial pulses are detected w/ Doppler on the right side and detected w/ Doppler on the left side.  Pulmonary:     Effort: Pulmonary effort is normal.  Skin:    General: Skin is warm and dry.  Neurological:     Mental Status: He is alert and oriented to person, place, and time.  Psychiatric:        Mood and Affect: Mood normal.        Behavior: Behavior normal.        Thought  Content: Thought content normal.        Judgment: Judgment normal.     BP 121/77   Pulse 85   Resp 16   Ht 5\' 3"  (1.6 m)   Wt 128 lb (58.1 kg)   BMI 22.67 kg/m   Past Medical History:  Diagnosis Date   Acute right PCA stroke (HCC) 03/12/2018   a.) residual loss of peripheral vision and balance issues   Anemia    Anxiety    Atherosclerosis of aorta (HCC)    Benign prostatic hyperplasia    Bilateral carotid artery stenosis    Cerebral atherosclerosis 03/13/2018   a.) CTA neck 03/13/2018: proximal inferior division RIGHT M2 segment stenosis with normal distal opacification; mod stenosis of the distal RIGHT P2 segment (likely culprit of 03/12/2018 CVA); diffuse small vessel Dz   Cervical spondylosis    Chronic back pain    a.) followed by [facility] mgmt at [facility]   DDD (degenerative disc disease), lumbar    Depression    Essential hypertension    GERD (gastroesophageal reflux disease)    HCV (hepatitis C virus)  a.) s/p Tx with ledipasvir/sofosbuvir   History of kidney stones    Leg ulcer, left (HCC) 2023   has been seen at the wound clinic   Lesion of BILATERAL parotid glands 03/13/2018   a.) CTA neck 03/13/2018: RIGHT (1.3 x 1.8 x 2.4 cm) and LEFT (1.5 x 0.9 1.2 cm); b.) CT soft tissue neck 04/05/2021: RIGHT (2.3 x 1.6 cm) and LEFT (2.3 x 1.6 cm)   Long term current use of antithrombotics/antiplatelets    a.) daily DAPT therapy (ASA + clopidogrel )   Lumbar radiculitis    Lumbar spondylosis    Mixed hyperlipidemia    Overdose of benzodiazepine    Overuse of medication 02/07/2022   a.) admitted to [facility] with decreased LOC secondary to overuse of "Delta 8 THC gummies"   Peripheral vascular disease (HCC)    Polysubstance abuse (HCC)    a.) marijuana + cocaine + heroin + BZO   Right-sided Bell's palsy    Type 2 diabetes mellitus without complication, without long-term current use of insulin (HCC)    Unresponsive episode 02/07/2022   Unsteady gait    a.) following CVA in 2019;  uses ambulation aid (walker) for balance   Urothelial carcinoma (HCC)    Wears dentures    Has full upper.  Does not wear.    Social History   Socioeconomic History   Marital status: Divorced    Spouse name: Not on file   Number of children: 4   Years of education: Not on file   Highest education level: Associate degree: occupational, Scientist, product/process development, or vocational program  Occupational History   Not on file  Tobacco Use   Smoking status: Every Day    Current packs/day: 0.50    Average packs/day: 0.5 packs/day for 53.0 years (26.5 ttl pk-yrs)    Types: Cigarettes   Smokeless tobacco: Never  Vaping Use   Vaping status: Never Used  Substance and Sexual Activity   Alcohol  use: Not Currently   Drug use: Not Currently    Types: Heroin, Cocaine, Marijuana    Comment: 1960's   Sexual activity: Yes  Other Topics Concern   Not on file  Social History Narrative   Lives with daughter and ex-wife       Social Drivers of Health   Financial Resource Strain: High Risk (03/11/2024)   Overall Financial Resource Strain (CARDIA)    Difficulty of Paying Living Expenses: Very hard  Food Insecurity: Food Insecurity Present (03/11/2024)   Hunger Vital Sign    Worried About Running Out of Food in the Last Year: Sometimes true    Ran Out of Food in the Last Year: Sometimes true  Transportation Needs: No Transportation Needs (03/11/2024)   PRAPARE - Administrator, Civil Service (Medical): No    Lack of Transportation (Non-Medical): No  Recent Concern: Transportation Needs - Unmet Transportation Needs (01/06/2024)   Received from [facility] - Transportation    In the past 12 months, has lack of transportation kept you from medical appointments or from getting medications?: Yes    Lack of Transportation (Non-Medical): Yes  Physical Activity: Unknown (03/11/2024)   Exercise Vital Sign    Days of Exercise per Week: 0 days    Minutes of Exercise per Session:  Not on file  Stress: Stress Concern Present (03/11/2024)   [employer] of Occupational Health - Occupational Stress Questionnaire    Feeling of Stress : Rather much  Social Connections: Socially Isolated (  03/11/2024)   Social Connection and Isolation Panel [NHANES]    Frequency of Communication with Friends and Family: Once a week    Frequency of Social Gatherings with Friends and Family: Never    Attends Religious Services: 1 to 4 times per year    Active Member of [employer] or Organizations: No    Attends Banker Meetings: Not on file    Marital Status: Widowed  Intimate Partner Violence: Unknown (01/23/2022)   Received from [facility]   HITS    Physically Hurt: Not on file    Insult or Talk Down To: Not on file    Threaten Physical Harm: Not on file    Scream or Curse: Not on file    Past Surgical History:  Procedure Laterality Date   ANTERIOR LUMBAR FUSION  05/13/2012   Procedure: ANTERIOR LUMBAR FUSION 1 LEVEL;  Surgeon: Mort Ards, MD;  Location: [facility] OR;  Service: Orthopedics;  Laterality: N/A;  ALIF L5-S1   CATARACT EXTRACTION W/PHACO Left 01/01/2021   Procedure: CATARACT EXTRACTION PHACO AND INTRAOCULAR LENS PLACEMENT (IOC) LEFT DIABETIC 2.28 00:24.7;  Surgeon: Rosa College, MD;  Location: [facility];  Service: Ophthalmology;  Laterality: Left;  covid + 11-30-20   CATARACT EXTRACTION W/PHACO Right 01/15/2021   Procedure: CATARACT EXTRACTION PHACO AND INTRAOCULAR LENS PLACEMENT (IOC) RIGHT DIABETIC;  Surgeon: Rosa College, MD;  Location: [facility];  Service: Ophthalmology;  Laterality: Right;  2.62 0:34.5   CHOLECYSTECTOMY     COLONOSCOPY WITH PROPOFOL  N/A 09/19/2021   Procedure: COLONOSCOPY WITH PROPOFOL ;  Surgeon: Toledo, Alphonsus Jeans, MD;  Location: [facility];  Service: Gastroenterology;  Laterality: N/A;   CYSTOSCOPY W/ RETROGRADES Left 08/04/2018   Procedure: CYSTOSCOPY WITH RETROGRADE PYELOGRAM;  Surgeon:  Geraline Knapp, MD;  Location: [facility] OR[facility];  Service: Urology;  Laterality: Left;   CYSTOSCOPY W/ RETROGRADES Left 01/20/2024   Procedure: CYSTOSCOPY, WITH RETROGRADE PYELOGRAM;  Surgeon: Geraline Knapp, MD;  Location: [facility] ORS;  Service: Urology;  Laterality: Left;   CYSTOSCOPY W/ URETERAL STENT PLACEMENT Right 07/30/2022   Procedure: CYSTOSCOPY WITH URETERAL STENT REPLACEMENT;  Surgeon: Geraline Knapp, MD;  Location: [facility] OR[facility];  Service: Urology;  Laterality: Right;   CYSTOSCOPY WITH BIOPSY N/A 01/20/2024   Procedure: CYSTOSCOPY and Urethral  WITH BIOPSY;  Surgeon: Geraline Knapp, MD;  Location: [facility] OR[facility];  Service: Urology;  Laterality: N/A;   CYSTOSCOPY WITH INSERTION OF UROLIFT N/A 01/18/2020   Procedure: CYSTOSCOPY WITH INSERTION OF UROLIFT;  Surgeon: Geraline Knapp, MD;  Location: [facility];  Service: Urology;  Laterality: N/A;   CYSTOSCOPY WITH RETROGRADE PYELOGRAM, URETEROSCOPY AND STENT PLACEMENT Left 06/18/2022   Procedure: CYSTOSCOPY WITH RETROGRADE PYELOGRAM;  Surgeon: Geraline Knapp, MD;  Location: [facility];  Service: Urology;  Laterality: Left;   CYSTOSCOPY WITH STENT PLACEMENT Left 08/04/2018   Procedure: CYSTOSCOPY WITH STENT PLACEMENT;  Surgeon: Geraline Knapp, MD;  Location: [facility] OR[facility];  Service: Urology;  Laterality: Left;   CYSTOSCOPY WITH URETEROSCOPY AND STENT PLACEMENT Right 06/18/2022   Procedure: CYSTOSCOPY WITH URETEROSCOPY AND STENT PLACEMENT;  Surgeon: Geraline Knapp, MD;  Location: [facility];  Service: Urology;  Laterality: Right;   EXTRACORPOREAL SHOCK WAVE LITHOTRIPSY  2012   EXTRACORPOREAL SHOCK WAVE LITHOTRIPSY Left 08/20/2018   Procedure: EXTRACORPOREAL SHOCK WAVE LITHOTRIPSY (ESWL);  Surgeon: Dustin Gimenez, MD;  Location: [facility];  Service: Urology;  Laterality: Left; (cancelled)   EXTRACORPOREAL SHOCK WAVE LITHOTRIPSY Left 08/27/2018   Procedure: EXTRACORPOREAL SHOCK  WAVE LITHOTRIPSY (ESWL);  Surgeon: Geraline Knapp, MD;  Location: [facility];  Service:  Urology;  Laterality: Left;   EXTRACORPOREAL SHOCK WAVE LITHOTRIPSY Right 10/15/2018   Procedure: EXTRACORPOREAL SHOCK WAVE LITHOTRIPSY (ESWL);  Surgeon: Lawerence Pressman, MD;  Location: [facility] OR[facility];  Service: Urology;  Laterality: Right;   KIDNEY STONE SURGERY     NEPHRECTOMY, RADICAL, ROBOT ASSISTED, LAPAROSCOPIC Right 11/21/2022   with ureterectomy   TRANSURETHRAL RESECTION OF BLADDER TUMOR N/A 01/20/2024   Procedure: TURBT (TRANSURETHRAL RESECTION OF BLADDER TUMOR);  Surgeon: Geraline Knapp, MD;  Location: [facility];  Service: Urology;  Laterality: N/A;   URETERAL BIOPSY Right 07/30/2022   Procedure: URETERAL BIOPSY OF RENAL PELVIS;  Surgeon: Geraline Knapp, MD;  Location: [facility];  Service: Urology;  Laterality: Right;   URETEROSCOPY Right 07/30/2022   Procedure: URETEROSCOPY;  Surgeon: Geraline Knapp, MD;  Location: [facility];  Service: Urology;  Laterality: Right;    Family History  Problem Relation Age of Onset   Stroke Mother    Diabetes Mother    Lung cancer Father    Diabetes Father     Allergies  Allergen Reactions   Lisinopril        Latest Ref Rng & Units 01/13/2024    3:04 PM 08/31/2022    4:57 PM 07/30/2022    6:37 AM  CBC  WBC 4.0 - 10.5 K/uL 11.5  11.0    Hemoglobin 13.0 - 17.0 g/dL 16.1  09.6  04.5   Hematocrit 39.0 - 52.0 % 46.1  45.9  41.0   Platelets 150 - 400 K/uL 254  231         CMP     Component Value Date/Time   NA 137 01/13/2024 1504   K 4.1 01/13/2024 1504   CL 100 01/13/2024 1504   CO2 26 01/13/2024 1504   GLUCOSE 102 (H) 01/13/2024 1504   BUN 28 (H) 01/13/2024 1504   BUN 10 05/21/2018 1020   CREATININE 1.33 (H) 01/13/2024 1504   CALCIUM  10.0 01/13/2024 1504   PROT 8.6 (H) 08/31/2022 1657   ALBUMIN 4.4 08/31/2022 1657   AST 22 08/31/2022 1657   ALT 21 08/31/2022 1657   ALKPHOS 85 08/31/2022 1657   BILITOT 0.6 08/31/2022 1657   GFRNONAA 55 (L) 01/13/2024 1504     No results found.     Assessment & Plan:   1.  Atherosclerosis of native artery of both lower extremities with rest pain (HCC) (Primary) Recommend:  The patient has evidence of severe atherosclerotic changes of both lower extremities with rest pain that is associated with preulcerative changes and impending tissue loss of the  feet.  This represents a limb threatening ischemia and places the patient at the risk for  limb loss.  Patient should undergo angiography of the  lower extremity with the hope for intervention for limb salvage.  The risks and benefits as well as the alternative therapies was discussed in detail with the patient.  All questions were answered.  Patient agrees to proceed with right followed by left lower extremity angiography.  The patient will follow up with me in the office after the procedure.      2. Essential hypertension Continue antihypertensive medications as already ordered, these medications have been reviewed and there are no changes at this time.  3. Lumbosacral radiculitis I did have a discussion with the patient that given his longstanding history of lower back issues some of this pain may actually be related  to his lower back.  With that said intervention may not completely resolve all of his pain and discomfort but given his notably reduced ABIs intervention is certainly indicated.   Current Outpatient Medications on File Prior to Visit  Medication Sig Dispense Refill   acetaminophen  (TYLENOL ) 500 MG tablet Oral     amLODipine  (NORVASC ) 2.5 MG tablet Take 2.5 mg by mouth every morning.     Blood Glucose Monitoring Suppl (HEALTHPRO BLOOD GLUCOSE MONITO) w/Device KIT MISCELLANEOUS     citalopram  (CELEXA ) 40 MG tablet Take 40 mg by mouth daily.     clopidogrel  (PLAVIX ) 75 MG tablet Take 75 mg by mouth daily.     Docusate Sodium  (DSS) 100 MG CAPS Oral     furosemide  (LASIX ) 20 MG tablet Take 20 mg by mouth daily.     JARDIANCE  25 MG TABS tablet Take 1 tablet (25 mg total) by mouth daily. 30 tablet 3    QUEtiapine  (SEROQUEL ) 200 MG tablet Take 200 mg by mouth at bedtime.     tamsulosin  (FLOMAX ) 0.4 MG CAPS capsule Take 0.4 mg by mouth at bedtime.     Multiple Vitamins-Minerals (CENTRUM SILVER 50+MEN) TABS Take 1 tablet by mouth daily. (Patient not taking: Reported on 03/11/2024)     No current facility-administered medications on file prior to visit.    There are no Patient Instructions on file for this visit. No follow-ups on file.   Maks Cavallero E Floreen Teegarden, NP

## 2024-03-16 LAB — VAS US ABI WITH/WO TBI
Left ABI: 0.63
Right ABI: 0.68

## 2024-03-22 DIAGNOSIS — Z905 Acquired absence of kidney: Secondary | ICD-10-CM | POA: Diagnosis not present

## 2024-03-22 DIAGNOSIS — Z79899 Other long term (current) drug therapy: Secondary | ICD-10-CM | POA: Diagnosis not present

## 2024-03-22 DIAGNOSIS — G40909 Epilepsy, unspecified, not intractable, without status epilepticus: Secondary | ICD-10-CM | POA: Diagnosis not present

## 2024-03-22 DIAGNOSIS — N189 Chronic kidney disease, unspecified: Secondary | ICD-10-CM | POA: Diagnosis not present

## 2024-03-22 DIAGNOSIS — F1910 Other psychoactive substance abuse, uncomplicated: Secondary | ICD-10-CM | POA: Diagnosis not present

## 2024-03-22 DIAGNOSIS — I129 Hypertensive chronic kidney disease with stage 1 through stage 4 chronic kidney disease, or unspecified chronic kidney disease: Secondary | ICD-10-CM | POA: Diagnosis not present

## 2024-03-22 DIAGNOSIS — E785 Hyperlipidemia, unspecified: Secondary | ICD-10-CM | POA: Diagnosis not present

## 2024-03-22 DIAGNOSIS — M545 Low back pain, unspecified: Secondary | ICD-10-CM | POA: Diagnosis not present

## 2024-03-22 DIAGNOSIS — C671 Malignant neoplasm of dome of bladder: Secondary | ICD-10-CM | POA: Diagnosis not present

## 2024-03-22 DIAGNOSIS — Z7984 Long term (current) use of oral hypoglycemic drugs: Secondary | ICD-10-CM | POA: Diagnosis not present

## 2024-03-22 DIAGNOSIS — I69398 Other sequelae of cerebral infarction: Secondary | ICD-10-CM | POA: Diagnosis not present

## 2024-03-22 DIAGNOSIS — Z79891 Long term (current) use of opiate analgesic: Secondary | ICD-10-CM | POA: Diagnosis not present

## 2024-03-22 DIAGNOSIS — G8929 Other chronic pain: Secondary | ICD-10-CM | POA: Diagnosis not present

## 2024-03-22 DIAGNOSIS — Z8551 Personal history of malignant neoplasm of bladder: Secondary | ICD-10-CM | POA: Diagnosis not present

## 2024-03-22 DIAGNOSIS — I10 Essential (primary) hypertension: Secondary | ICD-10-CM | POA: Diagnosis not present

## 2024-03-22 DIAGNOSIS — C679 Malignant neoplasm of bladder, unspecified: Secondary | ICD-10-CM | POA: Diagnosis not present

## 2024-03-22 DIAGNOSIS — N329 Bladder disorder, unspecified: Secondary | ICD-10-CM | POA: Diagnosis not present

## 2024-03-22 DIAGNOSIS — F17210 Nicotine dependence, cigarettes, uncomplicated: Secondary | ICD-10-CM | POA: Diagnosis not present

## 2024-03-22 DIAGNOSIS — Z9049 Acquired absence of other specified parts of digestive tract: Secondary | ICD-10-CM | POA: Diagnosis not present

## 2024-03-22 DIAGNOSIS — D494 Neoplasm of unspecified behavior of bladder: Secondary | ICD-10-CM | POA: Diagnosis not present

## 2024-03-22 DIAGNOSIS — C641 Malignant neoplasm of right kidney, except renal pelvis: Secondary | ICD-10-CM | POA: Diagnosis not present

## 2024-03-22 DIAGNOSIS — Z7902 Long term (current) use of antithrombotics/antiplatelets: Secondary | ICD-10-CM | POA: Diagnosis not present

## 2024-03-24 ENCOUNTER — Other Ambulatory Visit: Payer: Self-pay | Admitting: Family Medicine

## 2024-03-25 ENCOUNTER — Telehealth (INDEPENDENT_AMBULATORY_CARE_PROVIDER_SITE_OTHER): Payer: Self-pay

## 2024-03-25 NOTE — Telephone Encounter (Signed)
 Spoke with the patient's daughter and he is scheduled for his LLE  ( 05/04/24), and RLE (05/11/24) at the [facility]. Pre-procedure instructions were discussed and will be sent to Mychart and mailed. 04/13/24 and 04/20/24 was offered and declined.

## 2024-03-30 DIAGNOSIS — M546 Pain in thoracic spine: Secondary | ICD-10-CM | POA: Diagnosis not present

## 2024-03-30 DIAGNOSIS — M5414 Radiculopathy, thoracic region: Secondary | ICD-10-CM | POA: Diagnosis not present

## 2024-03-30 DIAGNOSIS — G8929 Other chronic pain: Secondary | ICD-10-CM | POA: Diagnosis not present

## 2024-03-30 DIAGNOSIS — M5442 Lumbago with sciatica, left side: Secondary | ICD-10-CM | POA: Diagnosis not present

## 2024-03-30 DIAGNOSIS — M5441 Lumbago with sciatica, right side: Secondary | ICD-10-CM | POA: Diagnosis not present

## 2024-03-30 DIAGNOSIS — M5416 Radiculopathy, lumbar region: Secondary | ICD-10-CM | POA: Diagnosis not present

## 2024-04-25 ENCOUNTER — Other Ambulatory Visit: Payer: Self-pay | Admitting: Family Medicine

## 2024-04-25 DIAGNOSIS — M5417 Radiculopathy, lumbosacral region: Secondary | ICD-10-CM

## 2024-04-26 DIAGNOSIS — M5126 Other intervertebral disc displacement, lumbar region: Secondary | ICD-10-CM | POA: Diagnosis not present

## 2024-04-26 DIAGNOSIS — M4807 Spinal stenosis, lumbosacral region: Secondary | ICD-10-CM | POA: Diagnosis not present

## 2024-04-26 DIAGNOSIS — G8929 Other chronic pain: Secondary | ICD-10-CM | POA: Diagnosis not present

## 2024-04-26 DIAGNOSIS — M5416 Radiculopathy, lumbar region: Secondary | ICD-10-CM | POA: Diagnosis not present

## 2024-04-26 DIAGNOSIS — M48061 Spinal stenosis, lumbar region without neurogenic claudication: Secondary | ICD-10-CM | POA: Diagnosis not present

## 2024-04-26 DIAGNOSIS — M546 Pain in thoracic spine: Secondary | ICD-10-CM | POA: Diagnosis not present

## 2024-04-26 DIAGNOSIS — M47816 Spondylosis without myelopathy or radiculopathy, lumbar region: Secondary | ICD-10-CM | POA: Diagnosis not present

## 2024-04-26 NOTE — Telephone Encounter (Signed)
 Requested medication (s) are due for refill today: yes   Requested medication (s) are on the active medication list: yes   Last refill:  5/22/#90 with 1 refill   Future visit scheduled: yes   Notes to clinic:  Please review for refill. Refill not delegated per protocol. Pharmacy request for 90 day prescription    Requested Prescriptions  Pending Prescriptions Disp Refills   tiZANidine  (ZANAFLEX ) 4 MG capsule [Pharmacy Med Name: TIZANIDINE  HCL 4 MG CAPSULE] 270 capsule 1    Sig: TAKE 1 CAPSULE BY MOUTH 3 TIMES DAILY.     Not Delegated - Cardiovascular:  Alpha-2 Agonists - tizanidine  Failed - 04/26/2024  4:48 PM      Failed - This refill cannot be delegated      Passed - Valid encounter within last 6 months    Recent Outpatient Visits           1 month ago PAD (peripheral artery disease) (HCC)   [place] [facility] Simmons-Robinson, [place], MD   2 months ago PAD (peripheral artery disease) (HCC)   [place] [facility] Simmons-Robinson, [place], MD   3 months ago Preop examination   [place] [facility] Gasper Nancyann BRAVO, MD   3 months ago PAD (peripheral artery disease) [facility])    [facility] Sharma Coyer, MD

## 2024-04-30 DIAGNOSIS — C678 Malignant neoplasm of overlapping sites of bladder: Secondary | ICD-10-CM | POA: Diagnosis not present

## 2024-05-03 DIAGNOSIS — M4802 Spinal stenosis, cervical region: Secondary | ICD-10-CM | POA: Diagnosis not present

## 2024-05-04 ENCOUNTER — Ambulatory Visit
Admission: RE | Admit: 2024-05-04 | Discharge: 2024-05-04 | Disposition: A | Discharge status: Home / Self Care | Payer: Medicare (Managed Care) | Attending: Vascular Surgery | Admitting: Vascular Surgery

## 2024-05-04 ENCOUNTER — Encounter: Payer: Self-pay | Admitting: Vascular Surgery

## 2024-05-04 ENCOUNTER — Other Ambulatory Visit (INDEPENDENT_AMBULATORY_CARE_PROVIDER_SITE_OTHER): Payer: Self-pay | Admitting: Vascular Surgery

## 2024-05-04 ENCOUNTER — Encounter
Admission: RE | Disposition: A | Discharge status: Home / Self Care | Payer: Self-pay | Source: Home / Self Care | Attending: Vascular Surgery

## 2024-05-04 ENCOUNTER — Other Ambulatory Visit: Payer: Self-pay

## 2024-05-04 DIAGNOSIS — M5417 Radiculopathy, lumbosacral region: Secondary | ICD-10-CM | POA: Diagnosis not present

## 2024-05-04 DIAGNOSIS — I70223 Atherosclerosis of native arteries of extremities with rest pain, bilateral legs: Secondary | ICD-10-CM | POA: Diagnosis not present

## 2024-05-04 DIAGNOSIS — Z7984 Long term (current) use of oral hypoglycemic drugs: Secondary | ICD-10-CM | POA: Diagnosis not present

## 2024-05-04 DIAGNOSIS — I700 Atherosclerosis of aorta: Secondary | ICD-10-CM

## 2024-05-04 DIAGNOSIS — F17210 Nicotine dependence, cigarettes, uncomplicated: Secondary | ICD-10-CM | POA: Diagnosis not present

## 2024-05-04 DIAGNOSIS — I10 Essential (primary) hypertension: Secondary | ICD-10-CM | POA: Insufficient documentation

## 2024-05-04 DIAGNOSIS — I70222 Atherosclerosis of native arteries of extremities with rest pain, left leg: Secondary | ICD-10-CM | POA: Insufficient documentation

## 2024-05-04 DIAGNOSIS — E1140 Type 2 diabetes mellitus with diabetic neuropathy, unspecified: Secondary | ICD-10-CM | POA: Insufficient documentation

## 2024-05-04 DIAGNOSIS — I70229 Atherosclerosis of native arteries of extremities with rest pain, unspecified extremity: Secondary | ICD-10-CM

## 2024-05-04 DIAGNOSIS — I70211 Atherosclerosis of native arteries of extremities with intermittent claudication, right leg: Secondary | ICD-10-CM | POA: Insufficient documentation

## 2024-05-04 DIAGNOSIS — E1151 Type 2 diabetes mellitus with diabetic peripheral angiopathy without gangrene: Secondary | ICD-10-CM | POA: Diagnosis not present

## 2024-05-04 HISTORY — PX: LOWER EXTREMITY ANGIOGRAPHY: CATH118251

## 2024-05-04 LAB — GLUCOSE, CAPILLARY: Glucose-Capillary: 102 mg/dL — ABNORMAL HIGH (ref 70–99)

## 2024-05-04 LAB — BUN: BUN: 41 mg/dL — ABNORMAL HIGH (ref 8–23)

## 2024-05-04 LAB — CREATININE, SERUM
Creatinine, Ser: 1.77 mg/dL — ABNORMAL HIGH (ref 0.61–1.24)
GFR, Estimated: 39 mL/min — ABNORMAL LOW (ref >60)

## 2024-05-04 SURGERY — LOWER EXTREMITY ANGIOGRAPHY
Anesthesia: Moderate Sedation | Site: Leg Lower | Laterality: Left

## 2024-05-04 MED ORDER — IODIXANOL 320 MG/ML IV SOLN
INTRAVENOUS | Status: DC | PRN
  Administered 2024-05-04: 90 mL

## 2024-05-04 MED ORDER — MORPHINE SULFATE (PF) 4 MG/ML IV SOLN: 2.0000 mg | INTRAVENOUS | Status: DC | PRN

## 2024-05-04 MED ORDER — FENTANYL CITRATE (PF) 100 MCG/2ML IJ SOLN
INTRAMUSCULAR | Status: DC | PRN
  Administered 2024-05-04: 25 ug via INTRAVENOUS
  Administered 2024-05-04: 50 ug via INTRAVENOUS
  Administered 2024-05-04: 25 ug via INTRAVENOUS

## 2024-05-04 MED ORDER — HEPARIN (PORCINE) IN NACL 1000-0.9 UT/500ML-% IV SOLN
INTRAVENOUS | Status: DC | PRN
  Administered 2024-05-04: 1000 mL

## 2024-05-04 MED ORDER — HYDRALAZINE HCL 20 MG/ML IJ SOLN: 5.0000 mg | INTRAMUSCULAR | Status: DC | PRN

## 2024-05-04 MED ORDER — LABETALOL HCL 5 MG/ML IV SOLN: 10.0000 mg | INTRAVENOUS | Status: DC | PRN

## 2024-05-04 MED ORDER — OXYCODONE HCL 5 MG PO TABS: 5.0000 mg | ORAL_TABLET | ORAL | Status: DC | PRN

## 2024-05-04 MED ORDER — ASPIRIN 81 MG PO TBEC: 81.0000 mg | DELAYED_RELEASE_TABLET | Freq: Every day | ORAL | 1 refills | Status: DC

## 2024-05-04 MED ORDER — HEPARIN SODIUM (PORCINE) 1000 UNIT/ML IJ SOLN
INTRAMUSCULAR | Status: DC | PRN
  Administered 2024-05-04: 6000 [IU] via INTRAVENOUS

## 2024-05-04 MED ORDER — SODIUM CHLORIDE 0.9 % IV SOLN: 250.0000 mL | INTRAVENOUS | Status: DC | PRN

## 2024-05-04 MED ORDER — CEFAZOLIN SODIUM-DEXTROSE 2-4 GM/100ML-% IV SOLN
INTRAVENOUS | Status: AC
  Filled 2024-05-04: qty 100

## 2024-05-04 MED ORDER — ATORVASTATIN CALCIUM 10 MG PO TABS: 10.0000 mg | ORAL_TABLET | Freq: Every day | ORAL | 11 refills | Status: AC

## 2024-05-04 MED ORDER — SODIUM CHLORIDE 0.9% FLUSH: 3.0000 mL | Freq: Two times a day (BID) | INTRAVENOUS | Status: DC

## 2024-05-04 MED ORDER — METHYLPREDNISOLONE SODIUM SUCC 125 MG IJ SOLR: 125.0000 mg | Freq: Once | INTRAMUSCULAR | Status: DC | PRN

## 2024-05-04 MED ORDER — FENTANYL CITRATE (PF) 100 MCG/2ML IJ SOLN
INTRAMUSCULAR | Status: AC
  Filled 2024-05-04: qty 2

## 2024-05-04 MED ORDER — SODIUM CHLORIDE 0.9 % IV SOLN: INTRAVENOUS | Status: DC

## 2024-05-04 MED ORDER — MIDAZOLAM HCL 2 MG/ML PO SYRP
8.0000 mg | ORAL_SOLUTION | Freq: Once | ORAL | Status: DC | PRN
Start: 2024-05-04 — End: 2024-05-04

## 2024-05-04 MED ORDER — ONDANSETRON HCL 4 MG/2ML IJ SOLN: 4.0000 mg | Freq: Four times a day (QID) | INTRAMUSCULAR | Status: DC | PRN

## 2024-05-04 MED ORDER — HYDROMORPHONE HCL 1 MG/ML IJ SOLN: 1.0000 mg | Freq: Once | INTRAMUSCULAR | Status: DC | PRN

## 2024-05-04 MED ORDER — DIPHENHYDRAMINE HCL 50 MG/ML IJ SOLN: 50.0000 mg | Freq: Once | INTRAMUSCULAR | Status: DC | PRN

## 2024-05-04 MED ORDER — ASPIRIN 325 MG PO TABS
325.0000 mg | ORAL_TABLET | ORAL | Status: AC
  Administered 2024-05-04: 325 mg via ORAL
  Filled 2024-05-04: qty 1

## 2024-05-04 MED ORDER — LIDOCAINE HCL (PF) 1 % IJ SOLN
INTRAMUSCULAR | Status: DC | PRN
  Administered 2024-05-04 (×2): 10 mL

## 2024-05-04 MED ORDER — ATORVASTATIN CALCIUM 10 MG PO TABS: 10.0000 mg | ORAL_TABLET | Freq: Every day | ORAL | 3 refills | Status: DC

## 2024-05-04 MED ORDER — ASPIRIN 325 MG PO TBEC
DELAYED_RELEASE_TABLET | ORAL | Status: AC
Start: 2024-05-04 — End: 2024-05-04
  Filled 2024-05-04: qty 1

## 2024-05-04 MED ORDER — MIDAZOLAM HCL 2 MG/2ML IJ SOLN
INTRAMUSCULAR | Status: DC | PRN
  Administered 2024-05-04: 2 mg via INTRAVENOUS
  Administered 2024-05-04: .5 mg via INTRAVENOUS
  Administered 2024-05-04: 1 mg via INTRAVENOUS

## 2024-05-04 MED ORDER — SODIUM CHLORIDE 0.9% FLUSH: 3.0000 mL | INTRAVENOUS | Status: DC | PRN

## 2024-05-04 MED ORDER — CEFAZOLIN SODIUM-DEXTROSE 2-4 GM/100ML-% IV SOLN
2.0000 g | INTRAVENOUS | Status: AC
  Administered 2024-05-04: 2 g via INTRAVENOUS

## 2024-05-04 MED ORDER — ACETAMINOPHEN 325 MG PO TABS
650.0000 mg | ORAL_TABLET | ORAL | Status: DC | PRN
Start: 2024-05-04 — End: 2024-05-04

## 2024-05-04 MED ORDER — SODIUM CHLORIDE 0.9 % IV SOLN
INTRAVENOUS | Status: DC
Start: 2024-05-04 — End: 2024-05-04

## 2024-05-04 MED ORDER — MIDAZOLAM HCL 5 MG/5ML IJ SOLN
INTRAMUSCULAR | Status: AC
  Filled 2024-05-04: qty 5

## 2024-05-04 MED ORDER — HEPARIN SODIUM (PORCINE) 1000 UNIT/ML IJ SOLN
INTRAMUSCULAR | Status: AC
Start: 2024-05-04 — End: 2024-05-04
  Filled 2024-05-04: qty 10

## 2024-05-04 MED ORDER — ASPIRIN 81 MG PO TBEC: 81.0000 mg | DELAYED_RELEASE_TABLET | Freq: Every day | ORAL | 12 refills | Status: AC

## 2024-05-04 MED ORDER — FAMOTIDINE 20 MG PO TABS: 40.0000 mg | ORAL_TABLET | Freq: Once | ORAL | Status: DC | PRN

## 2024-05-04 SURGICAL SUPPLY — 29 items
BALLOON LUTONIX AV 8X40X75 (BALLOONS) IMPLANT
BALLOON LUTONIX DCB 7X60X130 (BALLOONS) IMPLANT
CATH ANGIO 5F PIGTAIL 65CM (CATHETERS) IMPLANT
CATH BEACON 5 .035 40 KMP TP (CATHETERS) IMPLANT
CATH TEMPO 5F RIM 65CM (CATHETERS) IMPLANT
CATH VS15FR (CATHETERS) IMPLANT
COVER PROBE ULTRASOUND 5X96 (MISCELLANEOUS) IMPLANT
DEVICE PRESTO INFLATION (MISCELLANEOUS) IMPLANT
DEVICE TORQUE (MISCELLANEOUS) IMPLANT
DEVICE VASC CLSR CELT ART 6 (Vascular Products) IMPLANT
GLIDEWIRE ADV .035X180CM (WIRE) IMPLANT
GLIDEWIRE ADV .035X260CM (WIRE) IMPLANT
GLIDEWIRE STIFF .35X180X3 HYDR (WIRE) IMPLANT
GOWN STRL REUS W/ TWL LRG LVL3 (GOWN DISPOSABLE) ×1 IMPLANT
KIT HALO SNGL LOOP SNARE 6FR (MISCELLANEOUS) IMPLANT
NDL ENTRY 21GA 7CM ECHOTIP (NEEDLE) IMPLANT
NEEDLE ENTRY 21GA 7CM ECHOTIP (NEEDLE) ×1 IMPLANT
PACK ANGIOGRAPHY (CUSTOM PROCEDURE TRAY) ×1 IMPLANT
SET INTRO CAPELLA COAXIAL (SET/KITS/TRAYS/PACK) IMPLANT
SHEATH BRITE TIP 5FRX11 (SHEATH) IMPLANT
SHEATH BRITE TIP 6FR X 23 (SHEATH) IMPLANT
SHEATH BRITE TIP 6FRX11 (SHEATH) IMPLANT
STENT LIFESTAR 9X40 (Permanent Stent) IMPLANT
STENT LIFESTREAM 6X37X80 (Permanent Stent) IMPLANT
STENT LIFESTREAM 7X26X80 (Permanent Stent) IMPLANT
SYR MEDRAD MARK 7 150ML (SYRINGE) IMPLANT
TUBING CONTRAST HIGH PRESS 72 (TUBING) IMPLANT
WIRE J 3MM .035X145CM (WIRE) IMPLANT
WIRE SUPRACORE 300CM (WIRE) IMPLANT

## 2024-05-04 NOTE — H&P (View-Only) (Signed)
 MRN : [id number]  [name] is a 78 y.o. ([birth year]) male who presents with chief complaint of check circulation.  History of Present Illness:   Patient presents to [facility] for treatment of his atherosclerotic occlusive disease of the lower extremities.  He notes that his legs feel heavy and stiff and they become painful which is worse at night. This has been ongoing for the last 6 or 7 months. He does have neuropathy and he notes that that neuropathy is worse at night as well. He also has significant lower back issues. Currently has no open wounds or ulcerations. He does not walk significantly and so he is not able to describe claudication. His symptoms are concerning for rest pain however. Today he has an ABI 0.68 on the right and 0.63 on the left. He has previous ABIs in 2018 of 0.66 on the right and 0.53 on the left. He appears to have waveforms that appear to be more monophasic in nature with dampened toe waveforms on the right and stronger on the left.   Current Meds  Medication Sig   acetaminophen  (TYLENOL ) 500 MG tablet Oral   amLODipine  (NORVASC ) 2.5 MG tablet Take 2.5 mg by mouth every morning.   citalopram  (CELEXA ) 40 MG tablet Take 40 mg by mouth daily.   clopidogrel  (PLAVIX ) 75 MG tablet Take 75 mg by mouth daily.   DULoxetine  (CYMBALTA ) 60 MG capsule Take 1 capsule (60 mg total) by mouth daily.   folic acid  (FOLVITE ) 1 MG tablet Take 1 tablet (1 mg total) by mouth daily.   furosemide  (LASIX ) 20 MG tablet Take 20 mg by mouth daily.   gabapentin  (NEURONTIN ) 400 MG capsule Take 1 capsule (400 mg total) by mouth 2 (two) times daily.   JARDIANCE  25 MG TABS tablet Take 1 tablet (25 mg total) by mouth daily.   metFORMIN  (GLUCOPHAGE ) 500 MG tablet Take 1 tablet (500 mg total) by mouth 2 (two) times daily with a meal.   pregabalin  (LYRICA ) 75 MG capsule Take 1 capsule (75 mg total) by  mouth 2 (two) times daily.   QUEtiapine  (SEROQUEL ) 200 MG tablet Take 200 mg by mouth at bedtime.   tamsulosin  (FLOMAX ) 0.4 MG CAPS capsule Take 0.4 mg by mouth at bedtime.   tiZANidine  (ZANAFLEX ) 4 MG capsule TAKE 1 CAPSULE BY MOUTH 3 TIMES DAILY.    Past Medical History:  Diagnosis Date   Acute right PCA stroke (HCC) 03/12/2018   a.) residual loss of peripheral vision and balance issues   Anemia    Anxiety    Atherosclerosis of aorta (HCC)    Benign prostatic hyperplasia    Bilateral carotid artery stenosis    Cerebral atherosclerosis 03/13/2018   a.) CTA neck 03/13/2018: proximal inferior division RIGHT M2 segment stenosis with normal distal opacification; mod stenosis of the distal RIGHT P2 segment (likely culprit of 03/12/2018 CVA); diffuse small vessel Dz   Cervical spondylosis    Chronic back pain    a.) followed by pain mgmt at [facility]   DDD (degenerative disc disease), lumbar    Depression  Essential hypertension    GERD (gastroesophageal reflux disease)    HCV (hepatitis C virus)    a.) s/p Tx with ledipasvir/sofosbuvir   History of kidney stones    Leg ulcer, left (HCC) 2023   has been seen at the wound clinic   Lesion of BILATERAL parotid glands 03/13/2018   a.) CTA neck 03/13/2018: RIGHT (1.3 x 1.8 x 2.4 cm) and LEFT (1.5 x 0.9 1.2 cm); b.) CT soft tissue neck 04/05/2021: RIGHT (2.3 x 1.6 cm) and LEFT (2.3 x 1.6 cm)   Long term current use of antithrombotics/antiplatelets    a.) daily DAPT therapy (ASA + clopidogrel )   Lumbar radiculitis    Lumbar spondylosis    Mixed hyperlipidemia    Overdose of benzodiazepine    Overuse of medication 02/07/2022   a.) admitted to [facility] with decreased LOC secondary to overuse of Delta 8 THC gummies   Peripheral vascular disease (HCC)    Polysubstance abuse (HCC)    a.) marijuana + cocaine + heroin + BZO   Right-sided Bell's palsy    Type 2 diabetes mellitus without complication, without long-term current use of insulin (HCC)     Unresponsive episode 02/07/2022   Unsteady gait    a.) following CVA in 2019; uses ambulation aid (walker) for balance   Urothelial carcinoma (HCC)    Wears dentures    Has full upper.  Does not wear.    Past Surgical History:  Procedure Laterality Date   ANTERIOR LUMBAR FUSION  05/13/2012   Procedure: ANTERIOR LUMBAR FUSION 1 LEVEL;  Surgeon: Donaciano Sprang, MD;  Location: [facility] OR;  Service: Orthopedics;  Laterality: N/A;  ALIF L5-S1   CATARACT EXTRACTION W/PHACO Left 01/01/2021   Procedure: CATARACT EXTRACTION PHACO AND INTRAOCULAR LENS PLACEMENT (IOC) LEFT DIABETIC 2.28 00:24.7;  Surgeon: Myrna Adine Anes, MD;  Location: [facility];  Service: Ophthalmology;  Laterality: Left;  covid + 11-30-20   CATARACT EXTRACTION W/PHACO Right 01/15/2021   Procedure: CATARACT EXTRACTION PHACO AND INTRAOCULAR LENS PLACEMENT (IOC) RIGHT DIABETIC;  Surgeon: Myrna Adine Anes, MD;  Location: [facility];  Service: Ophthalmology;  Laterality: Right;  2.62 0:34.5   CHOLECYSTECTOMY     COLONOSCOPY WITH PROPOFOL  N/A 09/19/2021   Procedure: COLONOSCOPY WITH PROPOFOL ;  Surgeon: Toledo, Ladell POUR, MD;  Location: [facility];  Service: Gastroenterology;  Laterality: N/A;   CYSTOSCOPY W/ RETROGRADES Left 08/04/2018   Procedure: CYSTOSCOPY WITH RETROGRADE PYELOGRAM;  Surgeon: Twylla Glendia BROCKS, MD;  Location: [facility];  Service: Urology;  Laterality: Left;   CYSTOSCOPY W/ RETROGRADES Left 01/20/2024   Procedure: CYSTOSCOPY, WITH RETROGRADE PYELOGRAM;  Surgeon: Twylla Glendia BROCKS, MD;  Location: [facility];  Service: Urology;  Laterality: Left;   CYSTOSCOPY W/ URETERAL STENT PLACEMENT Right 07/30/2022   Procedure: CYSTOSCOPY WITH URETERAL STENT REPLACEMENT;  Surgeon: Twylla Glendia BROCKS, MD;  Location: [facility];  Service: Urology;  Laterality: Right;   CYSTOSCOPY WITH BIOPSY N/A 01/20/2024   Procedure: CYSTOSCOPY and Urethral  WITH BIOPSY;  Surgeon: Twylla Glendia BROCKS, MD;  Location: [facility];  Service:  Urology;  Laterality: N/A;   CYSTOSCOPY WITH INSERTION OF UROLIFT N/A 01/18/2020   Procedure: CYSTOSCOPY WITH INSERTION OF UROLIFT;  Surgeon: Twylla Glendia BROCKS, MD;  Location: [facility];  Service: Urology;  Laterality: N/A;   CYSTOSCOPY WITH RETROGRADE PYELOGRAM, URETEROSCOPY AND STENT PLACEMENT Left 06/18/2022   Procedure: CYSTOSCOPY WITH RETROGRADE PYELOGRAM;  Surgeon: Twylla Glendia BROCKS, MD;  Location: [facility];  Service: Urology;  Laterality: Left;   CYSTOSCOPY  WITH STENT PLACEMENT Left 08/04/2018   Procedure: CYSTOSCOPY WITH STENT PLACEMENT;  Surgeon: Twylla Glendia BROCKS, MD;  Location: [facility];  Service: Urology;  Laterality: Left;   CYSTOSCOPY WITH URETEROSCOPY AND STENT PLACEMENT Right 06/18/2022   Procedure: CYSTOSCOPY WITH URETEROSCOPY AND STENT PLACEMENT;  Surgeon: Twylla Glendia BROCKS, MD;  Location: [facility];  Service: Urology;  Laterality: Right;   EXTRACORPOREAL SHOCK WAVE LITHOTRIPSY  2012   EXTRACORPOREAL SHOCK WAVE LITHOTRIPSY Left 08/20/2018   Procedure: EXTRACORPOREAL SHOCK WAVE LITHOTRIPSY (ESWL);  Surgeon: Penne Knee, MD;  Location: [facility];  Service: Urology;  Laterality: Left; (cancelled)   EXTRACORPOREAL SHOCK WAVE LITHOTRIPSY Left 08/27/2018   Procedure: EXTRACORPOREAL SHOCK WAVE LITHOTRIPSY (ESWL);  Surgeon: Twylla Glendia BROCKS, MD;  Location: [facility];  Service: Urology;  Laterality: Left;   EXTRACORPOREAL SHOCK WAVE LITHOTRIPSY Right 10/15/2018   Procedure: EXTRACORPOREAL SHOCK WAVE LITHOTRIPSY (ESWL);  Surgeon: Francisca Redell BROCKS, MD;  Location: [facility];  Service: Urology;  Laterality: Right;   KIDNEY STONE SURGERY     NEPHRECTOMY, RADICAL, ROBOT ASSISTED, LAPAROSCOPIC Right 11/21/2022   with ureterectomy   TRANSURETHRAL RESECTION OF BLADDER TUMOR N/A 01/20/2024   Procedure: TURBT (TRANSURETHRAL RESECTION OF BLADDER TUMOR);  Surgeon: Twylla Glendia BROCKS, MD;  Location: [facility];  Service: Urology;  Laterality: N/A;   URETERAL BIOPSY Right 07/30/2022   Procedure: URETERAL BIOPSY  OF RENAL PELVIS;  Surgeon: Twylla Glendia BROCKS, MD;  Location: [facility];  Service: Urology;  Laterality: Right;   URETEROSCOPY Right 07/30/2022   Procedure: URETEROSCOPY;  Surgeon: Twylla Glendia BROCKS, MD;  Location: [facility];  Service: Urology;  Laterality: Right;    Social History Social History   Tobacco Use   Smoking status: Every Day    Current packs/day: 0.50    Average packs/day: 0.5 packs/day for 53.0 years (26.5 ttl pk-yrs)    Types: Cigarettes   Smokeless tobacco: Never  Vaping Use   Vaping status: Never Used  Substance Use Topics   Alcohol  use: Not Currently   Drug use: Not Currently    Types: Heroin, Cocaine, Marijuana    Comment: 1960's    Family History Family History  Problem Relation Age of Onset   Stroke Mother    Diabetes Mother    Lung cancer Father    Diabetes Father     Allergies  Allergen Reactions   Lisinopril      REVIEW OF SYSTEMS (Negative unless checked)  Constitutional: [] Weight loss  [] Fever  [] Chills Cardiac: [] Chest pain   [] Chest pressure   [] Palpitations   [] Shortness of breath when laying flat   [] Shortness of breath with exertion. Vascular:  [x] Pain in legs with walking   [] Pain in legs at rest  [] History of DVT   [] Phlebitis   [] Swelling in legs   [] Varicose veins   [] Non-healing ulcers Pulmonary:   [] Uses home oxygen   [] Productive cough   [] Hemoptysis   [] Wheeze  [] COPD   [] Asthma Neurologic:  [] Dizziness   [] Seizures   [] History of stroke   [] History of TIA  [] Aphasia   [] Vissual changes   [] Weakness or numbness in arm   [] Weakness or numbness in leg Musculoskeletal:   [] Joint swelling   [] Joint pain   [] Low back pain Hematologic:  [] Easy bruising  [] Easy bleeding   [] Hypercoagulable state   [] Anemic Gastrointestinal:  [] Diarrhea   [] Vomiting  [] Gastroesophageal reflux/heartburn   [] Difficulty swallowing. Genitourinary:  [] Chronic kidney disease   [] Difficult urination  [] Frequent urination   [] Blood in urine Skin:  [] Rashes   []   Ulcers   Psychological:  [] History of anxiety   []  History of major depression.  Physical Examination  Vitals:   05/04/24 0739  BP: (!) 142/73  Pulse: 68  Resp: 15  Temp: 98.3 F (36.8 C)  TempSrc: Temporal  SpO2: 97%  Weight: 59.5 kg  Height: 5' 3 (1.6 m)   Body mass index is 23.24 kg/m. Gen: WD/WN, NAD Head: [place]/AT, No temporalis wasting.  Ear/Nose/Throat: Hearing grossly intact, nares w/o erythema or drainage Eyes: PER, EOMI, sclera nonicteric.  Neck: Supple, no masses.  No bruit or JVD.  Pulmonary:  Good air movement, no audible wheezing, no use of accessory muscles.  Cardiac: RRR, normal S1, S2, no Murmurs. Vascular:  mild trophic changes, no open wounds Vessel Right Left  Radial Palpable Palpable  PT Not Palpable Not Palpable  DP Not Palpable Not Palpable  Gastrointestinal: soft, non-distended. No guarding/no peritoneal signs.  Musculoskeletal: M/S 5/5 throughout.  No visible deformity.  Neurologic: CN 2-12 intact. Pain and light touch intact in extremities.  Symmetrical.  Speech is fluent. Motor exam as listed above. Psychiatric: Judgment intact, Mood & affect appropriate for pt's clinical situation. Dermatologic: No rashes or ulcers noted.  No changes consistent with cellulitis.   CBC Lab Results  Component Value Date   WBC 11.5 (H) 01/13/2024   HGB 15.4 01/13/2024   HCT 46.1 01/13/2024   MCV 90.4 01/13/2024   PLT 254 01/13/2024    BMET    Component Value Date/Time   NA 137 01/13/2024 1504   K 4.1 01/13/2024 1504   CL 100 01/13/2024 1504   CO2 26 01/13/2024 1504   GLUCOSE 102 (H) 01/13/2024 1504   BUN 41 (H) 05/04/2024 0733   BUN 10 05/21/2018 1020   CREATININE 1.77 (H) 05/04/2024 0733   CALCIUM  10.0 01/13/2024 1504   GFRNONAA 39 (L) 05/04/2024 0733   GFRAA >60 06/03/2018 1831   Estimated Creatinine Clearance: 28.6 mL/min (A) (by C-G formula based on SCr of 1.77 mg/dL (H)).  COAG Lab Results  Component Value Date   INR 1.08 05/07/2012     Radiology No results found.   Assessment/Plan 1. Atherosclerosis of native artery of both lower extremities with rest pain (HCC) (Primary) Recommend:   The patient has evidence of severe atherosclerotic changes of both lower extremities with rest pain that is associated with preulcerative changes and impending tissue loss of the  feet.  This represents a limb threatening ischemia and places the patient at the risk for  limb loss.   Patient should undergo angiography of the  lower extremity with the hope for intervention for limb salvage.  The risks and benefits as well as the alternative therapies was discussed in detail with the patient.  All questions were answered.  Patient agrees to proceed with left followed by right lower extremity angiography with the hope for intervention.   The patient will follow up with me in the office after the procedure.     2. Essential hypertension Continue antihypertensive medications as already ordered, these medications have been reviewed and there are no changes at this time.   3. Lumbosacral radiculitis I did have a discussion with the patient that given his longstanding history of lower back issues some of this pain may actually be related to his lower back.  With that said intervention may not completely resolve all of his pain and discomfort but given his notably reduced ABIs intervention is certainly indicated.       Cordella Shawl, MD  05/04/2024 8:24 AM

## 2024-05-04 NOTE — Op Note (Signed)
 [name]  Percutaneous Study/Intervention Procedural Note   Date of Surgery: 05/04/2024  Surgeon:[name], [name]   Pre-operative Diagnosis: Atherosclerotic occlusive disease bilateral lower extremities with rest pain left lower extremity worse than the right  Post-operative diagnosis:  Same  Procedure(s) Performed:  1.  Abdominal aortogram  2.  Bilateral distal runoff  3.  Percutaneous transluminal angioplasty and stent placement right common iliac artery; kissing balloon technique  4.  Percutaneous transluminal and plasty and stent placement left common iliac artery; kissing balloon technique             5.  Percutaneous transluminal angioplasty and stent placement left external iliac artery  6.  Ultrasound guided access bilateral common femoral arteries  7.  StarClose closure device bilateral common femoral arteries  Anesthesia: Conscious sedation was administered under my direct supervision by the interventional radiology RN. IV Versed  plus fentanyl  were utilized. Continuous ECG, pulse oximetry and blood pressure was monitored throughout the entire procedure. Conscious sedation was for a total of 1 hour 17 minutes and 31 seconds.  Sheath: Bilateral 23 cm 6 French Pinnacle sheaths retrograde common femoral arteries  Contrast: 90 cc  Fluoroscopy Time: 19.9 minutes  Indications: Patient presents with increasing pain of both lower extremities.  Physical examination as well as noninvasive studies demonstrate significant atherosclerotic occlusive disease.  Angiography with hope for intervention has been recommended.  Risks and benefits have been reviewed all questions have been answered alternative therapies have also been discussed patient has agreed to proceed.  Procedure:  [name] a 78 y.o. male who was identified and appropriate procedural time out was performed.  The patient was then placed supine on the table and prepped and draped in the  usual sterile fashion.  Ultrasound was used to evaluate the right common femoral artery.  It was echolucent and pulsatile indicating it is patent .  An ultrasound image was acquired for the permanent record.  A micropuncture needle was used to access the right common femoral artery under direct ultrasound guidance.  The microwire was then advanced under fluoroscopic guidance without difficulty followed by the micro-sheath  A 0.035 J wire was advanced without resistance and a 5Fr sheath was placed.    The pigtail catheter was then positioned at the level of T12 and an AP image of the aorta was obtained. After review the images the pigtail catheter was repositioned above the aortic bifurcation and bilateral oblique views of the pelvis were obtained. Subsequently the detector was returned to the AP position and bilateral lower extremity runoff was obtained.  After review the images the ultrasound was reprepped and delivered back onto the sterile field. The left common femoral was then imaged with the ultrasound it was noted to be echolucent and pulsatile indicating patency. Images recorded for the permanent record. Under real-time visualization a microneedle was inserted into the anterior wall the common femoral artery microwire was then advanced without difficulty under fluoroscopic guidance followed by placement of the micro-sheath.  A J wire was then negotiated under fluoroscopic guidance into the aorta. 6 French sheath was then placed.  6000 Units of heparin  was given and allowed to circulate for proximally 4 minutes.  Findings in the aortic bifurcation were as follows the right common iliac artery demonstrates a greater than 75% stenosis at its origin with moderate poststenotic dilatation.  Distally the common iliac is diffusely diseased but widely patent.  The left common iliac artery demonstrates an occlusion over 2 to 3 cm  beginning just distal to the origin.  There is diffuse greater than 70% stenosis  throughout the common iliac extending down into the external iliac.  Internal iliac is patent bilaterally.  Working from both the left femoral approach as well as the right femoral approach with a series of different catheters including a pigtail rim and a V S1 as well as a series of different wires including advantage wires and stiff angled glide I was able to cross the occlusion from the right sided approach using the V S1 catheter.  The stiff angled Glidewire was then snared through the left femoral sheath and pulled extracorporeally.  The Kumpe catheter was then advanced over the wire in a retrograde fashion up into the proximal right common femoral.  The stiff angled Glidewire was then removed and the Kumpe catheter flipped so that it was aiming proximally.  Supra core wire was then advanced under fluoroscopic guidance and the 6 French by 23 cm Pinnacle sheath was advanced to the tip is in the distal aorta.  The right sheath was then upsized to a 6 x 23 French sheath as well after a advantage wire was advanced through the pigtail catheter. Magnified images of the aortic bifurcation were then made using hand injection contrast from the femoral sheaths. After appropriate sizing a 7 mm x 26 mm Lifestream stent was selected for the right and a 6 mm x 36 mm Lifestream stent was selected for the left. There were then advanced and positioned just above the aortic bifurcation. Insufflation for full expansion of the stents was performed simultaneously.  Next an 8 mm x 40 mm balloon was selected for the right and a 7 mm x 60 mm balloon was selected for the left.  Both the right and left iliac stents were then predilated using these larger balloons inflations were to 10 atm for approximately 1 minute.  Follow-up imaging was then performed and the left-sided need to be extended into the external iliac artery.  A 9 mm x 60 mm life star stent was then deployed overlapping the initial Lifestream stent and extending into  the external iliac artery.  It was then postdilated with a 7 mm x 60 mm Lutonix drug-eluting balloon inflated to 8 atm for 1 minute.  Follow-up imaging now demonstrated less than 10% residual stenosis throughout the iliac arteries bilaterally with successful reconstruction of the aortic bifurcation.  Oblique views were then obtained of the groins in succession and Celt device is deployed without difficulty bilaterally. There were no immediate complications   Findings:   Aortogram:  The abdominal aorta is opacified with a bolus injection contrast. Demonstrates diffuse disease but there are no hemodynamically significant lesions noted until the distal aortic bifurcation where bilateral greater than 70% ostial iliac lesions are identified. There is moderate poststenotic dilatation noted of the common iliac arteries as well.  Right Lower Extremity: The right common femoral and profunda femoris demonstrate diffuse calcific disease but are free of hemodynamically significant stenosis.  The superficial femoral artery demonstrates a greater than 80% focal stenosis in its proximal one third.  At [facility] canal it occludes.  There is reconstitution of the mid popliteal and there appears to be two-vessel runoff to the foot.  Left Lower Extremity:  The left common femoral and profunda femoris demonstrate diffuse calcific disease but are free of hemodynamically significant stenosis.  The superficial femoral artery occludes at [place]'s canal.  There is reconstitution of the above-knee popliteal.  There is two-vessel runoff to the  foot.  Following placement of the iliac stents there is now wide patency with less than 10% residual stenosis with rapid flow through the aortic bifurcation bilaterally.  Summary:  Successful reconstruction of the distal aorta and bilateral iliac arteries  Disposition: Patient was taken to the recovery room in stable condition having tolerated the procedure well.  Cameron  Quenton Recendez 05/04/2024,10:16 AM

## 2024-05-04 NOTE — Progress Notes (Signed)
 MRN : [id number]  [name] is a 78 y.o. ([birth year]) male who presents with chief complaint of check circulation.  History of Present Illness:   Patient presents to [facility] for treatment of his atherosclerotic occlusive disease of the lower extremities.  He notes that his legs feel heavy and stiff and they become painful which is worse at night. This has been ongoing for the last 6 or 7 months. He does have neuropathy and he notes that that neuropathy is worse at night as well. He also has significant lower back issues. Currently has no open wounds or ulcerations. He does not walk significantly and so he is not able to describe claudication. His symptoms are concerning for rest pain however. Today he has an ABI 0.68 on the right and 0.63 on the left. He has previous ABIs in 2018 of 0.66 on the right and 0.53 on the left. He appears to have waveforms that appear to be more monophasic in nature with dampened toe waveforms on the right and stronger on the left.   Current Meds  Medication Sig   acetaminophen  (TYLENOL ) 500 MG tablet Oral   amLODipine  (NORVASC ) 2.5 MG tablet Take 2.5 mg by mouth every morning.   citalopram  (CELEXA ) 40 MG tablet Take 40 mg by mouth daily.   clopidogrel  (PLAVIX ) 75 MG tablet Take 75 mg by mouth daily.   DULoxetine  (CYMBALTA ) 60 MG capsule Take 1 capsule (60 mg total) by mouth daily.   folic acid  (FOLVITE ) 1 MG tablet Take 1 tablet (1 mg total) by mouth daily.   furosemide  (LASIX ) 20 MG tablet Take 20 mg by mouth daily.   gabapentin  (NEURONTIN ) 400 MG capsule Take 1 capsule (400 mg total) by mouth 2 (two) times daily.   JARDIANCE  25 MG TABS tablet Take 1 tablet (25 mg total) by mouth daily.   metFORMIN  (GLUCOPHAGE ) 500 MG tablet Take 1 tablet (500 mg total) by mouth 2 (two) times daily with a meal.   pregabalin  (LYRICA ) 75 MG capsule Take 1 capsule (75 mg total) by  mouth 2 (two) times daily.   QUEtiapine  (SEROQUEL ) 200 MG tablet Take 200 mg by mouth at bedtime.   tamsulosin  (FLOMAX ) 0.4 MG CAPS capsule Take 0.4 mg by mouth at bedtime.   tiZANidine  (ZANAFLEX ) 4 MG capsule TAKE 1 CAPSULE BY MOUTH 3 TIMES DAILY.    Past Medical History:  Diagnosis Date   Acute right PCA stroke (HCC) 03/12/2018   a.) residual loss of peripheral vision and balance issues   Anemia    Anxiety    Atherosclerosis of aorta (HCC)    Benign prostatic hyperplasia    Bilateral carotid artery stenosis    Cerebral atherosclerosis 03/13/2018   a.) CTA neck 03/13/2018: proximal inferior division RIGHT M2 segment stenosis with normal distal opacification; mod stenosis of the distal RIGHT P2 segment (likely culprit of 03/12/2018 CVA); diffuse small vessel Dz   Cervical spondylosis    Chronic back pain    a.) followed by pain mgmt at [facility]   DDD (degenerative disc disease), lumbar    Depression  Essential hypertension    GERD (gastroesophageal reflux disease)    HCV (hepatitis C virus)    a.) s/p Tx with ledipasvir/sofosbuvir   History of kidney stones    Leg ulcer, left (HCC) 2023   has been seen at the wound clinic   Lesion of BILATERAL parotid glands 03/13/2018   a.) CTA neck 03/13/2018: RIGHT (1.3 x 1.8 x 2.4 cm) and LEFT (1.5 x 0.9 1.2 cm); b.) CT soft tissue neck 04/05/2021: RIGHT (2.3 x 1.6 cm) and LEFT (2.3 x 1.6 cm)   Long term current use of antithrombotics/antiplatelets    a.) daily DAPT therapy (ASA + clopidogrel )   Lumbar radiculitis    Lumbar spondylosis    Mixed hyperlipidemia    Overdose of benzodiazepine    Overuse of medication 02/07/2022   a.) admitted to [facility] with decreased LOC secondary to overuse of Delta 8 THC gummies   Peripheral vascular disease (HCC)    Polysubstance abuse (HCC)    a.) marijuana + cocaine + heroin + BZO   Right-sided Bell's palsy    Type 2 diabetes mellitus without complication, without long-term current use of insulin (HCC)     Unresponsive episode 02/07/2022   Unsteady gait    a.) following CVA in 2019; uses ambulation aid (walker) for balance   Urothelial carcinoma (HCC)    Wears dentures    Has full upper.  Does not wear.    Past Surgical History:  Procedure Laterality Date   ANTERIOR LUMBAR FUSION  05/13/2012   Procedure: ANTERIOR LUMBAR FUSION 1 LEVEL;  Surgeon: Donaciano Sprang, MD;  Location: [facility] OR;  Service: Orthopedics;  Laterality: N/A;  ALIF L5-S1   CATARACT EXTRACTION W/PHACO Left 01/01/2021   Procedure: CATARACT EXTRACTION PHACO AND INTRAOCULAR LENS PLACEMENT (IOC) LEFT DIABETIC 2.28 00:24.7;  Surgeon: Myrna Adine Anes, MD;  Location: [facility];  Service: Ophthalmology;  Laterality: Left;  covid + 11-30-20   CATARACT EXTRACTION W/PHACO Right 01/15/2021   Procedure: CATARACT EXTRACTION PHACO AND INTRAOCULAR LENS PLACEMENT (IOC) RIGHT DIABETIC;  Surgeon: Myrna Adine Anes, MD;  Location: [facility];  Service: Ophthalmology;  Laterality: Right;  2.62 0:34.5   CHOLECYSTECTOMY     COLONOSCOPY WITH PROPOFOL  N/A 09/19/2021   Procedure: COLONOSCOPY WITH PROPOFOL ;  Surgeon: Toledo, Ladell POUR, MD;  Location: [facility];  Service: Gastroenterology;  Laterality: N/A;   CYSTOSCOPY W/ RETROGRADES Left 08/04/2018   Procedure: CYSTOSCOPY WITH RETROGRADE PYELOGRAM;  Surgeon: Twylla Glendia BROCKS, MD;  Location: [facility];  Service: Urology;  Laterality: Left;   CYSTOSCOPY W/ RETROGRADES Left 01/20/2024   Procedure: CYSTOSCOPY, WITH RETROGRADE PYELOGRAM;  Surgeon: Twylla Glendia BROCKS, MD;  Location: [facility];  Service: Urology;  Laterality: Left;   CYSTOSCOPY W/ URETERAL STENT PLACEMENT Right 07/30/2022   Procedure: CYSTOSCOPY WITH URETERAL STENT REPLACEMENT;  Surgeon: Twylla Glendia BROCKS, MD;  Location: [facility];  Service: Urology;  Laterality: Right;   CYSTOSCOPY WITH BIOPSY N/A 01/20/2024   Procedure: CYSTOSCOPY and Urethral  WITH BIOPSY;  Surgeon: Twylla Glendia BROCKS, MD;  Location: [facility];  Service:  Urology;  Laterality: N/A;   CYSTOSCOPY WITH INSERTION OF UROLIFT N/A 01/18/2020   Procedure: CYSTOSCOPY WITH INSERTION OF UROLIFT;  Surgeon: Twylla Glendia BROCKS, MD;  Location: [facility];  Service: Urology;  Laterality: N/A;   CYSTOSCOPY WITH RETROGRADE PYELOGRAM, URETEROSCOPY AND STENT PLACEMENT Left 06/18/2022   Procedure: CYSTOSCOPY WITH RETROGRADE PYELOGRAM;  Surgeon: Twylla Glendia BROCKS, MD;  Location: [facility];  Service: Urology;  Laterality: Left;   CYSTOSCOPY  WITH STENT PLACEMENT Left 08/04/2018   Procedure: CYSTOSCOPY WITH STENT PLACEMENT;  Surgeon: Twylla Glendia BROCKS, MD;  Location: [facility];  Service: Urology;  Laterality: Left;   CYSTOSCOPY WITH URETEROSCOPY AND STENT PLACEMENT Right 06/18/2022   Procedure: CYSTOSCOPY WITH URETEROSCOPY AND STENT PLACEMENT;  Surgeon: Twylla Glendia BROCKS, MD;  Location: [facility];  Service: Urology;  Laterality: Right;   EXTRACORPOREAL SHOCK WAVE LITHOTRIPSY  2012   EXTRACORPOREAL SHOCK WAVE LITHOTRIPSY Left 08/20/2018   Procedure: EXTRACORPOREAL SHOCK WAVE LITHOTRIPSY (ESWL);  Surgeon: Penne Knee, MD;  Location: [facility];  Service: Urology;  Laterality: Left; (cancelled)   EXTRACORPOREAL SHOCK WAVE LITHOTRIPSY Left 08/27/2018   Procedure: EXTRACORPOREAL SHOCK WAVE LITHOTRIPSY (ESWL);  Surgeon: Twylla Glendia BROCKS, MD;  Location: [facility];  Service: Urology;  Laterality: Left;   EXTRACORPOREAL SHOCK WAVE LITHOTRIPSY Right 10/15/2018   Procedure: EXTRACORPOREAL SHOCK WAVE LITHOTRIPSY (ESWL);  Surgeon: Francisca Redell BROCKS, MD;  Location: [facility];  Service: Urology;  Laterality: Right;   KIDNEY STONE SURGERY     NEPHRECTOMY, RADICAL, ROBOT ASSISTED, LAPAROSCOPIC Right 11/21/2022   with ureterectomy   TRANSURETHRAL RESECTION OF BLADDER TUMOR N/A 01/20/2024   Procedure: TURBT (TRANSURETHRAL RESECTION OF BLADDER TUMOR);  Surgeon: Twylla Glendia BROCKS, MD;  Location: [facility];  Service: Urology;  Laterality: N/A;   URETERAL BIOPSY Right 07/30/2022   Procedure: URETERAL BIOPSY  OF RENAL PELVIS;  Surgeon: Twylla Glendia BROCKS, MD;  Location: [facility];  Service: Urology;  Laterality: Right;   URETEROSCOPY Right 07/30/2022   Procedure: URETEROSCOPY;  Surgeon: Twylla Glendia BROCKS, MD;  Location: [facility];  Service: Urology;  Laterality: Right;    Social History Social History   Tobacco Use   Smoking status: Every Day    Current packs/day: 0.50    Average packs/day: 0.5 packs/day for 53.0 years (26.5 ttl pk-yrs)    Types: Cigarettes   Smokeless tobacco: Never  Vaping Use   Vaping status: Never Used  Substance Use Topics   Alcohol  use: Not Currently   Drug use: Not Currently    Types: Heroin, Cocaine, Marijuana    Comment: 1960's    Family History Family History  Problem Relation Age of Onset   Stroke Mother    Diabetes Mother    Lung cancer Father    Diabetes Father     Allergies  Allergen Reactions   Lisinopril      REVIEW OF SYSTEMS (Negative unless checked)  Constitutional: [] Weight loss  [] Fever  [] Chills Cardiac: [] Chest pain   [] Chest pressure   [] Palpitations   [] Shortness of breath when laying flat   [] Shortness of breath with exertion. Vascular:  [x] Pain in legs with walking   [] Pain in legs at rest  [] History of DVT   [] Phlebitis   [] Swelling in legs   [] Varicose veins   [] Non-healing ulcers Pulmonary:   [] Uses home oxygen   [] Productive cough   [] Hemoptysis   [] Wheeze  [] COPD   [] Asthma Neurologic:  [] Dizziness   [] Seizures   [] History of stroke   [] History of TIA  [] Aphasia   [] Vissual changes   [] Weakness or numbness in arm   [] Weakness or numbness in leg Musculoskeletal:   [] Joint swelling   [] Joint pain   [] Low back pain Hematologic:  [] Easy bruising  [] Easy bleeding   [] Hypercoagulable state   [] Anemic Gastrointestinal:  [] Diarrhea   [] Vomiting  [] Gastroesophageal reflux/heartburn   [] Difficulty swallowing. Genitourinary:  [] Chronic kidney disease   [] Difficult urination  [] Frequent urination   [] Blood in urine Skin:  [] Rashes   []   Ulcers   Psychological:  [] History of anxiety   []  History of major depression.  Physical Examination  Vitals:   05/04/24 0739  BP: (!) 142/73  Pulse: 68  Resp: 15  Temp: 98.3 F (36.8 C)  TempSrc: Temporal  SpO2: 97%  Weight: 59.5 kg  Height: 5' 3 (1.6 m)   Body mass index is 23.24 kg/m. Gen: WD/WN, NAD Head: [place]/AT, No temporalis wasting.  Ear/Nose/Throat: Hearing grossly intact, nares w/o erythema or drainage Eyes: PER, EOMI, sclera nonicteric.  Neck: Supple, no masses.  No bruit or JVD.  Pulmonary:  Good air movement, no audible wheezing, no use of accessory muscles.  Cardiac: RRR, normal S1, S2, no Murmurs. Vascular:  mild trophic changes, no open wounds Vessel Right Left  Radial Palpable Palpable  PT Not Palpable Not Palpable  DP Not Palpable Not Palpable  Gastrointestinal: soft, non-distended. No guarding/no peritoneal signs.  Musculoskeletal: M/S 5/5 throughout.  No visible deformity.  Neurologic: CN 2-12 intact. Pain and light touch intact in extremities.  Symmetrical.  Speech is fluent. Motor exam as listed above. Psychiatric: Judgment intact, Mood & affect appropriate for pt's clinical situation. Dermatologic: No rashes or ulcers noted.  No changes consistent with cellulitis.   CBC Lab Results  Component Value Date   WBC 11.5 (H) 01/13/2024   HGB 15.4 01/13/2024   HCT 46.1 01/13/2024   MCV 90.4 01/13/2024   PLT 254 01/13/2024    BMET    Component Value Date/Time   NA 137 01/13/2024 1504   K 4.1 01/13/2024 1504   CL 100 01/13/2024 1504   CO2 26 01/13/2024 1504   GLUCOSE 102 (H) 01/13/2024 1504   BUN 41 (H) 05/04/2024 0733   BUN 10 05/21/2018 1020   CREATININE 1.77 (H) 05/04/2024 0733   CALCIUM  10.0 01/13/2024 1504   GFRNONAA 39 (L) 05/04/2024 0733   GFRAA >60 06/03/2018 1831   Estimated Creatinine Clearance: 28.6 mL/min (A) (by C-G formula based on SCr of 1.77 mg/dL (H)).  COAG Lab Results  Component Value Date   INR 1.08 05/07/2012     Radiology No results found.   Assessment/Plan 1. Atherosclerosis of native artery of both lower extremities with rest pain (HCC) (Primary) Recommend:   The patient has evidence of severe atherosclerotic changes of both lower extremities with rest pain that is associated with preulcerative changes and impending tissue loss of the  feet.  This represents a limb threatening ischemia and places the patient at the risk for  limb loss.   Patient should undergo angiography of the  lower extremity with the hope for intervention for limb salvage.  The risks and benefits as well as the alternative therapies was discussed in detail with the patient.  All questions were answered.  Patient agrees to proceed with left followed by right lower extremity angiography with the hope for intervention.   The patient will follow up with me in the office after the procedure.     2. Essential hypertension Continue antihypertensive medications as already ordered, these medications have been reviewed and there are no changes at this time.   3. Lumbosacral radiculitis I did have a discussion with the patient that given his longstanding history of lower back issues some of this pain may actually be related to his lower back.  With that said intervention may not completely resolve all of his pain and discomfort but given his notably reduced ABIs intervention is certainly indicated.       Cordella Shawl, MD  05/04/2024 8:24 AM

## 2024-05-04 NOTE — Interval H&P Note (Signed)
 History and Physical Interval Note:  05/04/2024 8:30 AM  [name]  has presented today for surgery, with the diagnosis of LLE Angio    ASO w rest pain.  The various methods of treatment have been discussed with the patient and family. After consideration of risks, benefits and other options for treatment, the patient has consented to  Procedure(s): Lower Extremity Angiography (Left) as a surgical intervention.  The patient's history has been reviewed, patient examined, no change in status, stable for surgery.  I have reviewed the patient's chart and labs.  Questions were answered to the patient's satisfaction.     Cordella Shawl

## 2024-05-05 ENCOUNTER — Encounter: Payer: Self-pay | Admitting: Vascular Surgery

## 2024-05-05 DIAGNOSIS — Z5111 Encounter for antineoplastic chemotherapy: Secondary | ICD-10-CM | POA: Diagnosis not present

## 2024-05-05 DIAGNOSIS — Z79899 Other long term (current) drug therapy: Secondary | ICD-10-CM | POA: Diagnosis not present

## 2024-05-05 DIAGNOSIS — C678 Malignant neoplasm of overlapping sites of bladder: Secondary | ICD-10-CM | POA: Diagnosis not present

## 2024-05-11 ENCOUNTER — Other Ambulatory Visit (INDEPENDENT_AMBULATORY_CARE_PROVIDER_SITE_OTHER): Payer: Self-pay | Admitting: Vascular Surgery

## 2024-05-11 ENCOUNTER — Other Ambulatory Visit: Payer: Self-pay

## 2024-05-11 ENCOUNTER — Ambulatory Visit
Admission: RE | Admit: 2024-05-11 | Discharge: 2024-05-11 | Disposition: A | Discharge status: Home / Self Care | Payer: Medicare (Managed Care) | Attending: Vascular Surgery | Admitting: Vascular Surgery

## 2024-05-11 ENCOUNTER — Encounter
Admission: RE | Disposition: A | Discharge status: Home / Self Care | Payer: Self-pay | Source: Home / Self Care | Attending: Vascular Surgery

## 2024-05-11 ENCOUNTER — Encounter: Payer: Self-pay | Admitting: Vascular Surgery

## 2024-05-11 DIAGNOSIS — Z539 Procedure and treatment not carried out, unspecified reason: Secondary | ICD-10-CM | POA: Insufficient documentation

## 2024-05-11 DIAGNOSIS — N289 Disorder of kidney and ureter, unspecified: Secondary | ICD-10-CM

## 2024-05-11 DIAGNOSIS — R569 Unspecified convulsions: Secondary | ICD-10-CM | POA: Diagnosis not present

## 2024-05-11 DIAGNOSIS — I70229 Atherosclerosis of native arteries of extremities with rest pain, unspecified extremity: Secondary | ICD-10-CM | POA: Insufficient documentation

## 2024-05-11 LAB — CREATININE, SERUM
Creatinine, Ser: 2.13 mg/dL — ABNORMAL HIGH (ref 0.61–1.24)
GFR, Estimated: 31 mL/min — ABNORMAL LOW (ref >60)

## 2024-05-11 LAB — GLUCOSE, CAPILLARY: Glucose-Capillary: 110 mg/dL — ABNORMAL HIGH (ref 70–99)

## 2024-05-11 LAB — BUN: BUN: 36 mg/dL — ABNORMAL HIGH (ref 8–23)

## 2024-05-11 SURGERY — LOWER EXTREMITY ANGIOGRAPHY
Anesthesia: Moderate Sedation | Site: Leg Lower | Laterality: Right

## 2024-05-11 MED ORDER — FAMOTIDINE 20 MG PO TABS: 40.0000 mg | ORAL_TABLET | Freq: Once | ORAL | Status: DC | PRN

## 2024-05-11 MED ORDER — DIPHENHYDRAMINE HCL 50 MG/ML IJ SOLN: 50.0000 mg | Freq: Once | INTRAMUSCULAR | Status: DC | PRN

## 2024-05-11 MED ORDER — SODIUM CHLORIDE 0.9 % IV BOLUS: 500.0000 mL | Freq: Once | INTRAVENOUS | Status: DC

## 2024-05-11 MED ORDER — ONDANSETRON HCL 4 MG/2ML IJ SOLN: 4.0000 mg | Freq: Four times a day (QID) | INTRAMUSCULAR | Status: DC | PRN

## 2024-05-11 MED ORDER — CEFAZOLIN SODIUM-DEXTROSE 2-4 GM/100ML-% IV SOLN
INTRAVENOUS | Status: AC
Start: 2024-05-11 — End: 2024-05-11
  Filled 2024-05-11: qty 100

## 2024-05-11 MED ORDER — METHYLPREDNISOLONE SODIUM SUCC 125 MG IJ SOLR: 125.0000 mg | Freq: Once | INTRAMUSCULAR | Status: DC | PRN

## 2024-05-11 MED ORDER — SODIUM CHLORIDE 0.9 % IV SOLN: INTRAVENOUS | Status: DC

## 2024-05-11 MED ORDER — CEFAZOLIN SODIUM-DEXTROSE 2-4 GM/100ML-% IV SOLN: 2.0000 g | INTRAVENOUS | Status: DC

## 2024-05-11 MED ORDER — MIDAZOLAM HCL 2 MG/ML PO SYRP: 8.0000 mg | ORAL_SOLUTION | Freq: Once | ORAL | Status: DC | PRN

## 2024-05-11 MED ORDER — HYDROMORPHONE HCL 1 MG/ML IJ SOLN: 1.0000 mg | Freq: Once | INTRAMUSCULAR | Status: DC | PRN

## 2024-05-11 NOTE — Progress Notes (Signed)
 Dr. Jama came by and spoke with pt. And his daughter [name]. Pt. To be seen in office in approx. 2 weeks. Daughter and pt. Verbalize understanding of conversation.

## 2024-05-11 NOTE — Progress Notes (Signed)
 The patient presented today for follow-up angiography and further intervention.  However, he is creatinine was elevated compared to his previous value.  It is now 2.13 prior to his angiogram on May 04, 2024 it was 1.7.  This could be secondary to contrast exposure from just 7 days ago it could be dehydration or combination thereof.  The patient reports that he has not been eating or drinking for 2 full days he just does not feel like it.  I will give him a 500 cc bolus of normal saline while he is here.  He was encouraged to eat and drink in a usual typical fashion.  I will plan to see him back in the office in approximately 2 weeks.  Repeat BUN and creatinine will be obtained.  Once we see this is normalized or that he is back to his baseline then he can be rescheduled.  If he does not return to his baseline I will consult nephrology.

## 2024-05-11 NOTE — Progress Notes (Signed)
 Cre 2.13 today. Note sent to MD and NP (compared to Cre 1.77 7/115/25). Await orders.

## 2024-05-11 NOTE — Progress Notes (Signed)
 NS bolus completed per orders. Pt. Assisted with dressing & getting to wheelchair. DC instructions reviewed with pt. And his daughter with verbalized understanding. Pt. States I gaetano' get out of here & smoke. Pt. Assisted to car from w/c with male RN assist. Stable for DC home at present.

## 2024-05-11 NOTE — Progress Notes (Signed)
 Spoke with MD. Orders received for NS bolus. MD to speak with pt. And family . Procedure cancelled for today per MD

## 2024-05-12 DIAGNOSIS — M51362 Other intervertebral disc degeneration, lumbar region with discogenic back pain and lower extremity pain: Secondary | ICD-10-CM | POA: Diagnosis not present

## 2024-05-12 DIAGNOSIS — Z5112 Encounter for antineoplastic immunotherapy: Secondary | ICD-10-CM | POA: Diagnosis not present

## 2024-05-12 DIAGNOSIS — C678 Malignant neoplasm of overlapping sites of bladder: Secondary | ICD-10-CM | POA: Diagnosis not present

## 2024-05-14 ENCOUNTER — Telehealth: Payer: Self-pay | Admitting: Family Medicine

## 2024-05-14 ENCOUNTER — Other Ambulatory Visit: Payer: Self-pay

## 2024-05-14 DIAGNOSIS — M5136 Other intervertebral disc degeneration, lumbar region with discogenic back pain only: Secondary | ICD-10-CM

## 2024-05-14 DIAGNOSIS — E1140 Type 2 diabetes mellitus with diabetic neuropathy, unspecified: Secondary | ICD-10-CM

## 2024-05-14 NOTE — Telephone Encounter (Signed)
 [facility] is asking for a new rx for Amlodipine  Besylate 2.5 mg. #90 with 3 RF

## 2024-05-14 NOTE — Telephone Encounter (Signed)
Converted to rf request

## 2024-05-14 NOTE — Telephone Encounter (Signed)
 [facility] is also asking for new Rx on   Furosemide  20 mg. #90 with 3 RF Gabapentin  100 mg. #180 with RF

## 2024-05-17 ENCOUNTER — Encounter: Payer: Self-pay | Admitting: Family Medicine

## 2024-05-18 DIAGNOSIS — Z8673 Personal history of transient ischemic attack (TIA), and cerebral infarction without residual deficits: Secondary | ICD-10-CM | POA: Diagnosis not present

## 2024-05-18 DIAGNOSIS — G959 Disease of spinal cord, unspecified: Secondary | ICD-10-CM | POA: Diagnosis not present

## 2024-05-18 DIAGNOSIS — R42 Dizziness and giddiness: Secondary | ICD-10-CM | POA: Diagnosis not present

## 2024-05-18 DIAGNOSIS — R454 Irritability and anger: Secondary | ICD-10-CM | POA: Diagnosis not present

## 2024-05-18 DIAGNOSIS — E889 Metabolic disorder, unspecified: Secondary | ICD-10-CM | POA: Diagnosis not present

## 2024-05-18 DIAGNOSIS — G629 Polyneuropathy, unspecified: Secondary | ICD-10-CM | POA: Diagnosis not present

## 2024-05-18 DIAGNOSIS — W19XXXA Unspecified fall, initial encounter: Secondary | ICD-10-CM | POA: Diagnosis not present

## 2024-05-18 DIAGNOSIS — R2689 Other abnormalities of gait and mobility: Secondary | ICD-10-CM | POA: Diagnosis not present

## 2024-05-18 DIAGNOSIS — R29898 Other symptoms and signs involving the musculoskeletal system: Secondary | ICD-10-CM | POA: Diagnosis not present

## 2024-05-18 DIAGNOSIS — M545 Low back pain, unspecified: Secondary | ICD-10-CM | POA: Diagnosis not present

## 2024-05-18 DIAGNOSIS — R569 Unspecified convulsions: Secondary | ICD-10-CM | POA: Diagnosis not present

## 2024-05-18 DIAGNOSIS — M51362 Other intervertebral disc degeneration, lumbar region with discogenic back pain and lower extremity pain: Secondary | ICD-10-CM | POA: Diagnosis not present

## 2024-05-19 DIAGNOSIS — C678 Malignant neoplasm of overlapping sites of bladder: Secondary | ICD-10-CM | POA: Diagnosis not present

## 2024-05-20 ENCOUNTER — Encounter: Payer: Self-pay | Admitting: Family Medicine

## 2024-05-23 DIAGNOSIS — I70229 Atherosclerosis of native arteries of extremities with rest pain, unspecified extremity: Secondary | ICD-10-CM | POA: Insufficient documentation

## 2024-05-23 NOTE — H&P (View-Only) (Signed)
 MRN : [id number]  [name] is a 78 y.o. (06-16-1946) male who presents with chief complaint of check circulation.  History of Present Illness:   The patient presented to [facility] on 05/11/2024 for follow-up angiography and further intervention.  However, his creatinine was elevated compared to his previous value.  It is now 2.13 prior to his angiogram on May 04, 2024 it was 1.7.  This could be secondary to contrast exposure from just 7 days ago it could be dehydration or combination thereof.  The patient reports that he has not been eating or drinking for 2 full days he just does not feel like it.   I will give him a 500 cc bolus of normal saline while he is here.  He was encouraged to eat and drink in a usual typical fashion.    No outpatient medications have been marked as taking for the 05/24/24 encounter (Appointment) with Jama, Cordella [name], MD.    Past Medical History:  Diagnosis Date   Acute right PCA stroke (HCC) 03/12/2018   a.) residual loss of peripheral vision and balance issues   Anemia    Anxiety    Atherosclerosis of aorta (HCC)    Benign prostatic hyperplasia    Bilateral carotid artery stenosis    Cerebral atherosclerosis 03/13/2018   a.) CTA neck 03/13/2018: proximal inferior division RIGHT M2 segment stenosis with normal distal opacification; mod stenosis of the distal RIGHT P2 segment (likely culprit of 03/12/2018 CVA); diffuse small vessel Dz   Cervical spondylosis    Chronic back pain    a.) followed by pain mgmt at [facility]   DDD (degenerative disc disease), lumbar    Depression    Essential hypertension    GERD (gastroesophageal reflux disease)    HCV (hepatitis C virus)    a.) s/p Tx with ledipasvir/sofosbuvir   History of kidney stones    Leg ulcer, left (HCC) 2023   has been seen at the wound clinic   Lesion of BILATERAL parotid glands 03/13/2018   a.) CTA neck 03/13/2018: RIGHT (1.3  x 1.8 x 2.4 cm) and LEFT (1.5 x 0.9 1.2 cm); b.) CT soft tissue neck 04/05/2021: RIGHT (2.3 x 1.6 cm) and LEFT (2.3 x 1.6 cm)   Long term current use of antithrombotics/antiplatelets    a.) daily DAPT therapy (ASA + clopidogrel )   Lumbar radiculitis    Lumbar spondylosis    Mixed hyperlipidemia    Overdose of benzodiazepine    Overuse of medication 02/07/2022   a.) admitted to [facility] with decreased LOC secondary to overuse of Delta 8 THC gummies   Peripheral vascular disease (HCC)    Polysubstance abuse (HCC)    a.) marijuana + cocaine + heroin + BZO   Right-sided Bell's palsy    Type 2 diabetes mellitus without complication, without long-term current use of insulin (HCC)    Unresponsive episode 02/07/2022   Unsteady gait    a.) following CVA in 2019; uses ambulation aid (walker) for balance   Urothelial carcinoma (HCC)    Wears dentures    Has full upper.  Does not wear.    Past Surgical History:  Procedure Laterality Date   ANTERIOR LUMBAR FUSION  05/13/2012   Procedure: ANTERIOR LUMBAR FUSION 1 LEVEL;  Surgeon: Donaciano Sprang, MD;  Location: [facility] OR;  Service: Orthopedics;  Laterality: N/A;  ALIF L5-S1   CATARACT EXTRACTION W/PHACO Left 01/01/2021   Procedure: CATARACT EXTRACTION PHACO AND INTRAOCULAR LENS PLACEMENT (IOC) LEFT DIABETIC 2.28 00:24.7;  Surgeon: Myrna Adine Anes, MD;  Location: [facility];  Service: Ophthalmology;  Laterality: Left;  covid + 11-30-20   CATARACT EXTRACTION W/PHACO Right 01/15/2021   Procedure: CATARACT EXTRACTION PHACO AND INTRAOCULAR LENS PLACEMENT (IOC) RIGHT DIABETIC;  Surgeon: Myrna Adine Anes, MD;  Location: [facility];  Service: Ophthalmology;  Laterality: Right;  2.62 0:34.5   CHOLECYSTECTOMY     COLONOSCOPY WITH PROPOFOL  N/A 09/19/2021   Procedure: COLONOSCOPY WITH PROPOFOL ;  Surgeon: Toledo, Ladell POUR, MD;  Location: [facility];  Service: Gastroenterology;  Laterality: N/A;   CYSTOSCOPY W/ RETROGRADES Left 08/04/2018    Procedure: CYSTOSCOPY WITH RETROGRADE PYELOGRAM;  Surgeon: Twylla Glendia BROCKS, MD;  Location: [facility];  Service: Urology;  Laterality: Left;   CYSTOSCOPY W/ RETROGRADES Left 01/20/2024   Procedure: CYSTOSCOPY, WITH RETROGRADE PYELOGRAM;  Surgeon: Twylla Glendia BROCKS, MD;  Location: [facility];  Service: Urology;  Laterality: Left;   CYSTOSCOPY W/ URETERAL STENT PLACEMENT Right 07/30/2022   Procedure: CYSTOSCOPY WITH URETERAL STENT REPLACEMENT;  Surgeon: Twylla Glendia BROCKS, MD;  Location: [facility];  Service: Urology;  Laterality: Right;   CYSTOSCOPY WITH BIOPSY N/A 01/20/2024   Procedure: CYSTOSCOPY and Urethral  WITH BIOPSY;  Surgeon: Twylla Glendia BROCKS, MD;  Location: [facility];  Service: Urology;  Laterality: N/A;   CYSTOSCOPY WITH INSERTION OF UROLIFT N/A 01/18/2020   Procedure: CYSTOSCOPY WITH INSERTION OF UROLIFT;  Surgeon: Twylla Glendia BROCKS, MD;  Location: [facility];  Service: Urology;  Laterality: N/A;   CYSTOSCOPY WITH RETROGRADE PYELOGRAM, URETEROSCOPY AND STENT PLACEMENT Left 06/18/2022   Procedure: CYSTOSCOPY WITH RETROGRADE PYELOGRAM;  Surgeon: Twylla Glendia BROCKS, MD;  Location: [facility];  Service: Urology;  Laterality: Left;   CYSTOSCOPY WITH STENT PLACEMENT Left 08/04/2018   Procedure: CYSTOSCOPY WITH STENT PLACEMENT;  Surgeon: Twylla Glendia BROCKS, MD;  Location: [facility];  Service: Urology;  Laterality: Left;   CYSTOSCOPY WITH URETEROSCOPY AND STENT PLACEMENT Right 06/18/2022   Procedure: CYSTOSCOPY WITH URETEROSCOPY AND STENT PLACEMENT;  Surgeon: Twylla Glendia BROCKS, MD;  Location: [facility];  Service: Urology;  Laterality: Right;   EXTRACORPOREAL SHOCK WAVE LITHOTRIPSY  2012   EXTRACORPOREAL SHOCK WAVE LITHOTRIPSY Left 08/20/2018   Procedure: EXTRACORPOREAL SHOCK WAVE LITHOTRIPSY (ESWL);  Surgeon: Penne Knee, MD;  Location: [facility];  Service: Urology;  Laterality: Left; (cancelled)   EXTRACORPOREAL SHOCK WAVE LITHOTRIPSY Left 08/27/2018   Procedure: EXTRACORPOREAL SHOCK WAVE LITHOTRIPSY (ESWL);   Surgeon: Twylla Glendia BROCKS, MD;  Location: [facility];  Service: Urology;  Laterality: Left;   EXTRACORPOREAL SHOCK WAVE LITHOTRIPSY Right 10/15/2018   Procedure: EXTRACORPOREAL SHOCK WAVE LITHOTRIPSY (ESWL);  Surgeon: Francisca Redell BROCKS, MD;  Location: [facility];  Service: Urology;  Laterality: Right;   KIDNEY STONE SURGERY     LOWER EXTREMITY ANGIOGRAPHY Left 05/04/2024   Procedure: Lower Extremity Angiography;  Surgeon: Jama Cordella [name], MD;  Location: [facility];  Service: Cardiovascular;  Laterality: Left;   NEPHRECTOMY, RADICAL, ROBOT ASSISTED, LAPAROSCOPIC Right 11/21/2022   with ureterectomy   TRANSURETHRAL RESECTION OF BLADDER TUMOR N/A 01/20/2024   Procedure: TURBT (TRANSURETHRAL RESECTION OF BLADDER TUMOR);  Surgeon: Twylla,  Glendia BROCKS, MD;  Location: [facility];  Service: Urology;  Laterality: N/A;   URETERAL BIOPSY Right 07/30/2022   Procedure: URETERAL BIOPSY OF RENAL PELVIS;  Surgeon: Twylla Glendia BROCKS, MD;  Location: [facility];  Service: Urology;  Laterality: Right;   URETEROSCOPY Right 07/30/2022   Procedure: URETEROSCOPY;  Surgeon: Twylla Glendia BROCKS, MD;  Location: [facility];  Service: Urology;  Laterality: Right;    Social History Social History   Tobacco Use   Smoking status: Every Day    Current packs/day: 0.50    Average packs/day: 0.5 packs/day for 53.0 years (26.5 ttl pk-yrs)    Types: Cigarettes   Smokeless tobacco: Never  Vaping Use   Vaping status: Never Used  Substance Use Topics   Alcohol  use: Not Currently   Drug use: Not Currently    Types: Heroin, Cocaine, Marijuana    Comment: 1960's    Family History Family History  Problem Relation Age of Onset   Stroke Mother    Diabetes Mother    Lung cancer Father    Diabetes Father     Allergies  Allergen Reactions   Lisinopril      REVIEW OF SYSTEMS (Negative unless checked)  Constitutional: [] Weight loss  [] Fever  [] Chills Cardiac: [] Chest pain   [] Chest pressure   [] Palpitations   [] Shortness of  breath when laying flat   [] Shortness of breath with exertion. Vascular:  [x] Pain in legs with walking   [] Pain in legs at rest  [] History of DVT   [] Phlebitis   [] Swelling in legs   [] Varicose veins   [] Non-healing ulcers Pulmonary:   [] Uses home oxygen   [] Productive cough   [] Hemoptysis   [] Wheeze  [] COPD   [] Asthma Neurologic:  [] Dizziness   [] Seizures   [] History of stroke   [] History of TIA  [] Aphasia   [] Vissual changes   [] Weakness or numbness in arm   [] Weakness or numbness in leg Musculoskeletal:   [] Joint swelling   [] Joint pain   [] Low back pain Hematologic:  [] Easy bruising  [] Easy bleeding   [] Hypercoagulable state   [] Anemic Gastrointestinal:  [] Diarrhea   [] Vomiting  [] Gastroesophageal reflux/heartburn   [] Difficulty swallowing. Genitourinary:  [] Chronic kidney disease   [] Difficult urination  [] Frequent urination   [] Blood in urine Skin:  [] Rashes   [] Ulcers  Psychological:  [] History of anxiety   []  History of major depression.  Physical Examination  There were no vitals filed for this visit. There is no height or weight on file to calculate BMI. Gen: WD/WN, NAD Head: [place]/AT, No temporalis wasting.  Ear/Nose/Throat: Hearing grossly intact, nares w/o erythema or drainage Eyes: PER, EOMI, sclera nonicteric.  Neck: Supple, no masses.  No bruit or JVD.  Pulmonary:  Good air movement, no audible wheezing, no use of accessory muscles.  Cardiac: RRR, normal S1, S2, no Murmurs. Vascular:  mild trophic changes, no open wounds Vessel Right Left  Radial Palpable Palpable  PT Not Palpable Not Palpable  DP Not Palpable Not Palpable  Gastrointestinal: soft, non-distended. No guarding/no peritoneal signs.  Musculoskeletal: M/S 5/5 throughout.  No visible deformity.  Neurologic: CN 2-12 intact. Pain and light touch intact in extremities.  Symmetrical.  Speech is fluent. Motor exam as listed above. Psychiatric: Judgment intact, Mood & affect appropriate for pt's clinical  situation. Dermatologic: No rashes or ulcers noted.  No changes consistent with cellulitis.   CBC Lab Results  Component Value Date   WBC 11.5 (H) 01/13/2024   HGB 15.4 01/13/2024   HCT  46.1 01/13/2024   MCV 90.4 01/13/2024   PLT 254 01/13/2024    BMET    Component Value Date/Time   NA 137 01/13/2024 1504   K 4.1 01/13/2024 1504   CL 100 01/13/2024 1504   CO2 26 01/13/2024 1504   GLUCOSE 102 (H) 01/13/2024 1504   BUN 36 (H) 05/11/2024 0725   BUN 10 05/21/2018 1020   CREATININE 2.13 (H) 05/11/2024 0725   CALCIUM  10.0 01/13/2024 1504   GFRNONAA 31 (L) 05/11/2024 0725   GFRAA >60 06/03/2018 1831   Estimated Creatinine Clearance: 23.7 mL/min (A) (by C-G formula based on SCr of 2.13 mg/dL (H)).  COAG Lab Results  Component Value Date   INR 1.08 05/07/2012    Radiology PERIPHERAL VASCULAR CATHETERIZATION Result Date: 05/04/2024 See surgical note for result.    Assessment/Plan 1. Atherosclerosis of native artery of both lower extremities with rest pain (HCC) (Primary) Recommend:  The patient has evidence of severe atherosclerotic changes of both lower extremities with rest pain that is associated with preulcerative changes and impending tissue loss of the right foot.  This represents a limb threatening ischemia and places the patient at the risk for right limb loss.  Patient should undergo angiography of the right lower extremity with the hope for intervention for limb salvage.  The risks and benefits as well as the alternative therapies was discussed in detail with the patient.  All questions were answered.  Patient agrees to proceed with right lower extremity angiography.  The patient will follow up with me in the office after the procedure.   I70.223    critical limb ischemia of the lower extremity I70.229    Atherosclerotic occlusive disease with rest pain  CPT codes: 62773   stent placement femoral-popliteal artery 36247   introduction catheter below diaphragm  third order  2. Bilateral carotid artery stenosis Recommend:  Given the patient's asymptomatic subcritical stenosis no further invasive testing or surgery at this time.  Duplex ultrasound shows 1-39% stenosis bilaterally.  Continue antiplatelet therapy as prescribed Continue management of CAD, HTN and Hyperlipidemia Healthy heart diet,  encouraged exercise at least 4 times per week  Follow up in 24 months with duplex ultrasound and physical exam   3. Essential hypertension Continue antihypertensive medications as already ordered, these medications have been reviewed and there are no changes at this time.  4. Diabetes mellitus type 2, diet-controlled (HCC) Continue hypoglycemic medications as already ordered, these medications have been reviewed and there are no changes at this time.  Hgb A1C to be monitored as already arranged by primary service  5. Mixed hyperlipidemia Continue statin as ordered and reviewed, no changes at this time    Cordella Shawl, MD  05/23/2024 2:24 PM

## 2024-05-23 NOTE — Progress Notes (Signed)
 MRN : [id number]  [name] is a 78 y.o. (06-16-1946) male who presents with chief complaint of check circulation.  History of Present Illness:   The patient presented to [facility] on 05/11/2024 for follow-up angiography and further intervention.  However, his creatinine was elevated compared to his previous value.  It is now 2.13 prior to his angiogram on May 04, 2024 it was 1.7.  This could be secondary to contrast exposure from just 7 days ago it could be dehydration or combination thereof.  The patient reports that he has not been eating or drinking for 2 full days he just does not feel like it.   I will give him a 500 cc bolus of normal saline while he is here.  He was encouraged to eat and drink in a usual typical fashion.    No outpatient medications have been marked as taking for the 05/24/24 encounter (Appointment) with Jama, Cordella [name], MD.    Past Medical History:  Diagnosis Date   Acute right PCA stroke (HCC) 03/12/2018   a.) residual loss of peripheral vision and balance issues   Anemia    Anxiety    Atherosclerosis of aorta (HCC)    Benign prostatic hyperplasia    Bilateral carotid artery stenosis    Cerebral atherosclerosis 03/13/2018   a.) CTA neck 03/13/2018: proximal inferior division RIGHT M2 segment stenosis with normal distal opacification; mod stenosis of the distal RIGHT P2 segment (likely culprit of 03/12/2018 CVA); diffuse small vessel Dz   Cervical spondylosis    Chronic back pain    a.) followed by pain mgmt at [facility]   DDD (degenerative disc disease), lumbar    Depression    Essential hypertension    GERD (gastroesophageal reflux disease)    HCV (hepatitis C virus)    a.) s/p Tx with ledipasvir/sofosbuvir   History of kidney stones    Leg ulcer, left (HCC) 2023   has been seen at the wound clinic   Lesion of BILATERAL parotid glands 03/13/2018   a.) CTA neck 03/13/2018: RIGHT (1.3  x 1.8 x 2.4 cm) and LEFT (1.5 x 0.9 1.2 cm); b.) CT soft tissue neck 04/05/2021: RIGHT (2.3 x 1.6 cm) and LEFT (2.3 x 1.6 cm)   Long term current use of antithrombotics/antiplatelets    a.) daily DAPT therapy (ASA + clopidogrel )   Lumbar radiculitis    Lumbar spondylosis    Mixed hyperlipidemia    Overdose of benzodiazepine    Overuse of medication 02/07/2022   a.) admitted to [facility] with decreased LOC secondary to overuse of Delta 8 THC gummies   Peripheral vascular disease (HCC)    Polysubstance abuse (HCC)    a.) marijuana + cocaine + heroin + BZO   Right-sided Bell's palsy    Type 2 diabetes mellitus without complication, without long-term current use of insulin (HCC)    Unresponsive episode 02/07/2022   Unsteady gait    a.) following CVA in 2019; uses ambulation aid (walker) for balance   Urothelial carcinoma (HCC)    Wears dentures    Has full upper.  Does not wear.    Past Surgical History:  Procedure Laterality Date   ANTERIOR LUMBAR FUSION  05/13/2012   Procedure: ANTERIOR LUMBAR FUSION 1 LEVEL;  Surgeon: Donaciano Sprang, MD;  Location: [facility] OR;  Service: Orthopedics;  Laterality: N/A;  ALIF L5-S1   CATARACT EXTRACTION W/PHACO Left 01/01/2021   Procedure: CATARACT EXTRACTION PHACO AND INTRAOCULAR LENS PLACEMENT (IOC) LEFT DIABETIC 2.28 00:24.7;  Surgeon: Myrna Adine Anes, MD;  Location: [facility];  Service: Ophthalmology;  Laterality: Left;  covid + 11-30-20   CATARACT EXTRACTION W/PHACO Right 01/15/2021   Procedure: CATARACT EXTRACTION PHACO AND INTRAOCULAR LENS PLACEMENT (IOC) RIGHT DIABETIC;  Surgeon: Myrna Adine Anes, MD;  Location: [facility];  Service: Ophthalmology;  Laterality: Right;  2.62 0:34.5   CHOLECYSTECTOMY     COLONOSCOPY WITH PROPOFOL  N/A 09/19/2021   Procedure: COLONOSCOPY WITH PROPOFOL ;  Surgeon: Toledo, Ladell POUR, MD;  Location: [facility];  Service: Gastroenterology;  Laterality: N/A;   CYSTOSCOPY W/ RETROGRADES Left 08/04/2018    Procedure: CYSTOSCOPY WITH RETROGRADE PYELOGRAM;  Surgeon: Twylla Glendia BROCKS, MD;  Location: [facility];  Service: Urology;  Laterality: Left;   CYSTOSCOPY W/ RETROGRADES Left 01/20/2024   Procedure: CYSTOSCOPY, WITH RETROGRADE PYELOGRAM;  Surgeon: Twylla Glendia BROCKS, MD;  Location: [facility];  Service: Urology;  Laterality: Left;   CYSTOSCOPY W/ URETERAL STENT PLACEMENT Right 07/30/2022   Procedure: CYSTOSCOPY WITH URETERAL STENT REPLACEMENT;  Surgeon: Twylla Glendia BROCKS, MD;  Location: [facility];  Service: Urology;  Laterality: Right;   CYSTOSCOPY WITH BIOPSY N/A 01/20/2024   Procedure: CYSTOSCOPY and Urethral  WITH BIOPSY;  Surgeon: Twylla Glendia BROCKS, MD;  Location: [facility];  Service: Urology;  Laterality: N/A;   CYSTOSCOPY WITH INSERTION OF UROLIFT N/A 01/18/2020   Procedure: CYSTOSCOPY WITH INSERTION OF UROLIFT;  Surgeon: Twylla Glendia BROCKS, MD;  Location: [facility];  Service: Urology;  Laterality: N/A;   CYSTOSCOPY WITH RETROGRADE PYELOGRAM, URETEROSCOPY AND STENT PLACEMENT Left 06/18/2022   Procedure: CYSTOSCOPY WITH RETROGRADE PYELOGRAM;  Surgeon: Twylla Glendia BROCKS, MD;  Location: [facility];  Service: Urology;  Laterality: Left;   CYSTOSCOPY WITH STENT PLACEMENT Left 08/04/2018   Procedure: CYSTOSCOPY WITH STENT PLACEMENT;  Surgeon: Twylla Glendia BROCKS, MD;  Location: [facility];  Service: Urology;  Laterality: Left;   CYSTOSCOPY WITH URETEROSCOPY AND STENT PLACEMENT Right 06/18/2022   Procedure: CYSTOSCOPY WITH URETEROSCOPY AND STENT PLACEMENT;  Surgeon: Twylla Glendia BROCKS, MD;  Location: [facility];  Service: Urology;  Laterality: Right;   EXTRACORPOREAL SHOCK WAVE LITHOTRIPSY  2012   EXTRACORPOREAL SHOCK WAVE LITHOTRIPSY Left 08/20/2018   Procedure: EXTRACORPOREAL SHOCK WAVE LITHOTRIPSY (ESWL);  Surgeon: Penne Knee, MD;  Location: [facility];  Service: Urology;  Laterality: Left; (cancelled)   EXTRACORPOREAL SHOCK WAVE LITHOTRIPSY Left 08/27/2018   Procedure: EXTRACORPOREAL SHOCK WAVE LITHOTRIPSY (ESWL);   Surgeon: Twylla Glendia BROCKS, MD;  Location: [facility];  Service: Urology;  Laterality: Left;   EXTRACORPOREAL SHOCK WAVE LITHOTRIPSY Right 10/15/2018   Procedure: EXTRACORPOREAL SHOCK WAVE LITHOTRIPSY (ESWL);  Surgeon: Francisca Redell BROCKS, MD;  Location: [facility];  Service: Urology;  Laterality: Right;   KIDNEY STONE SURGERY     LOWER EXTREMITY ANGIOGRAPHY Left 05/04/2024   Procedure: Lower Extremity Angiography;  Surgeon: Jama Cordella [name], MD;  Location: [facility];  Service: Cardiovascular;  Laterality: Left;   NEPHRECTOMY, RADICAL, ROBOT ASSISTED, LAPAROSCOPIC Right 11/21/2022   with ureterectomy   TRANSURETHRAL RESECTION OF BLADDER TUMOR N/A 01/20/2024   Procedure: TURBT (TRANSURETHRAL RESECTION OF BLADDER TUMOR);  Surgeon: Twylla,  Glendia BROCKS, MD;  Location: [facility];  Service: Urology;  Laterality: N/A;   URETERAL BIOPSY Right 07/30/2022   Procedure: URETERAL BIOPSY OF RENAL PELVIS;  Surgeon: Twylla Glendia BROCKS, MD;  Location: [facility];  Service: Urology;  Laterality: Right;   URETEROSCOPY Right 07/30/2022   Procedure: URETEROSCOPY;  Surgeon: Twylla Glendia BROCKS, MD;  Location: [facility];  Service: Urology;  Laterality: Right;    Social History Social History   Tobacco Use   Smoking status: Every Day    Current packs/day: 0.50    Average packs/day: 0.5 packs/day for 53.0 years (26.5 ttl pk-yrs)    Types: Cigarettes   Smokeless tobacco: Never  Vaping Use   Vaping status: Never Used  Substance Use Topics   Alcohol  use: Not Currently   Drug use: Not Currently    Types: Heroin, Cocaine, Marijuana    Comment: 1960's    Family History Family History  Problem Relation Age of Onset   Stroke Mother    Diabetes Mother    Lung cancer Father    Diabetes Father     Allergies  Allergen Reactions   Lisinopril      REVIEW OF SYSTEMS (Negative unless checked)  Constitutional: [] Weight loss  [] Fever  [] Chills Cardiac: [] Chest pain   [] Chest pressure   [] Palpitations   [] Shortness of  breath when laying flat   [] Shortness of breath with exertion. Vascular:  [x] Pain in legs with walking   [] Pain in legs at rest  [] History of DVT   [] Phlebitis   [] Swelling in legs   [] Varicose veins   [] Non-healing ulcers Pulmonary:   [] Uses home oxygen   [] Productive cough   [] Hemoptysis   [] Wheeze  [] COPD   [] Asthma Neurologic:  [] Dizziness   [] Seizures   [] History of stroke   [] History of TIA  [] Aphasia   [] Vissual changes   [] Weakness or numbness in arm   [] Weakness or numbness in leg Musculoskeletal:   [] Joint swelling   [] Joint pain   [] Low back pain Hematologic:  [] Easy bruising  [] Easy bleeding   [] Hypercoagulable state   [] Anemic Gastrointestinal:  [] Diarrhea   [] Vomiting  [] Gastroesophageal reflux/heartburn   [] Difficulty swallowing. Genitourinary:  [] Chronic kidney disease   [] Difficult urination  [] Frequent urination   [] Blood in urine Skin:  [] Rashes   [] Ulcers  Psychological:  [] History of anxiety   []  History of major depression.  Physical Examination  There were no vitals filed for this visit. There is no height or weight on file to calculate BMI. Gen: WD/WN, NAD Head: [place]/AT, No temporalis wasting.  Ear/Nose/Throat: Hearing grossly intact, nares w/o erythema or drainage Eyes: PER, EOMI, sclera nonicteric.  Neck: Supple, no masses.  No bruit or JVD.  Pulmonary:  Good air movement, no audible wheezing, no use of accessory muscles.  Cardiac: RRR, normal S1, S2, no Murmurs. Vascular:  mild trophic changes, no open wounds Vessel Right Left  Radial Palpable Palpable  PT Not Palpable Not Palpable  DP Not Palpable Not Palpable  Gastrointestinal: soft, non-distended. No guarding/no peritoneal signs.  Musculoskeletal: M/S 5/5 throughout.  No visible deformity.  Neurologic: CN 2-12 intact. Pain and light touch intact in extremities.  Symmetrical.  Speech is fluent. Motor exam as listed above. Psychiatric: Judgment intact, Mood & affect appropriate for pt's clinical  situation. Dermatologic: No rashes or ulcers noted.  No changes consistent with cellulitis.   CBC Lab Results  Component Value Date   WBC 11.5 (H) 01/13/2024   HGB 15.4 01/13/2024   HCT  46.1 01/13/2024   MCV 90.4 01/13/2024   PLT 254 01/13/2024    BMET    Component Value Date/Time   NA 137 01/13/2024 1504   K 4.1 01/13/2024 1504   CL 100 01/13/2024 1504   CO2 26 01/13/2024 1504   GLUCOSE 102 (H) 01/13/2024 1504   BUN 36 (H) 05/11/2024 0725   BUN 10 05/21/2018 1020   CREATININE 2.13 (H) 05/11/2024 0725   CALCIUM  10.0 01/13/2024 1504   GFRNONAA 31 (L) 05/11/2024 0725   GFRAA >60 06/03/2018 1831   Estimated Creatinine Clearance: 23.7 mL/min (A) (by C-G formula based on SCr of 2.13 mg/dL (H)).  COAG Lab Results  Component Value Date   INR 1.08 05/07/2012    Radiology PERIPHERAL VASCULAR CATHETERIZATION Result Date: 05/04/2024 See surgical note for result.    Assessment/Plan 1. Atherosclerosis of native artery of both lower extremities with rest pain (HCC) (Primary) Recommend:  The patient has evidence of severe atherosclerotic changes of both lower extremities with rest pain that is associated with preulcerative changes and impending tissue loss of the right foot.  This represents a limb threatening ischemia and places the patient at the risk for right limb loss.  Patient should undergo angiography of the right lower extremity with the hope for intervention for limb salvage.  The risks and benefits as well as the alternative therapies was discussed in detail with the patient.  All questions were answered.  Patient agrees to proceed with right lower extremity angiography.  The patient will follow up with me in the office after the procedure.   I70.223    critical limb ischemia of the lower extremity I70.229    Atherosclerotic occlusive disease with rest pain  CPT codes: 62773   stent placement femoral-popliteal artery 36247   introduction catheter below diaphragm  third order  2. Bilateral carotid artery stenosis Recommend:  Given the patient's asymptomatic subcritical stenosis no further invasive testing or surgery at this time.  Duplex ultrasound shows 1-39% stenosis bilaterally.  Continue antiplatelet therapy as prescribed Continue management of CAD, HTN and Hyperlipidemia Healthy heart diet,  encouraged exercise at least 4 times per week  Follow up in 24 months with duplex ultrasound and physical exam   3. Essential hypertension Continue antihypertensive medications as already ordered, these medications have been reviewed and there are no changes at this time.  4. Diabetes mellitus type 2, diet-controlled (HCC) Continue hypoglycemic medications as already ordered, these medications have been reviewed and there are no changes at this time.  Hgb A1C to be monitored as already arranged by primary service  5. Mixed hyperlipidemia Continue statin as ordered and reviewed, no changes at this time    Cordella Shawl, MD  05/23/2024 2:24 PM

## 2024-05-24 ENCOUNTER — Ambulatory Visit (INDEPENDENT_AMBULATORY_CARE_PROVIDER_SITE_OTHER): Payer: Medicare (Managed Care) | Admitting: Vascular Surgery

## 2024-05-24 ENCOUNTER — Encounter (INDEPENDENT_AMBULATORY_CARE_PROVIDER_SITE_OTHER): Payer: Self-pay | Admitting: Vascular Surgery

## 2024-05-24 ENCOUNTER — Other Ambulatory Visit
Admission: RE | Admit: 2024-05-24 | Discharge: 2024-05-24 | Disposition: A | Discharge status: Home / Self Care | Payer: Medicare (Managed Care) | Source: Ambulatory Visit | Attending: Vascular Surgery | Admitting: Vascular Surgery

## 2024-05-24 VITALS — BP 112/74 | HR 74 | Resp 16 | Ht 63.0 in | Wt 130.0 lb

## 2024-05-24 DIAGNOSIS — I10 Essential (primary) hypertension: Secondary | ICD-10-CM | POA: Diagnosis not present

## 2024-05-24 DIAGNOSIS — E119 Type 2 diabetes mellitus without complications: Secondary | ICD-10-CM | POA: Diagnosis not present

## 2024-05-24 DIAGNOSIS — I70223 Atherosclerosis of native arteries of extremities with rest pain, bilateral legs: Secondary | ICD-10-CM | POA: Diagnosis not present

## 2024-05-24 DIAGNOSIS — N289 Disorder of kidney and ureter, unspecified: Secondary | ICD-10-CM | POA: Insufficient documentation

## 2024-05-24 DIAGNOSIS — I6523 Occlusion and stenosis of bilateral carotid arteries: Secondary | ICD-10-CM | POA: Diagnosis not present

## 2024-05-24 DIAGNOSIS — E782 Mixed hyperlipidemia: Secondary | ICD-10-CM

## 2024-05-24 LAB — BASIC METABOLIC PANEL WITH GFR
Anion gap: 12 (ref 5–15)
BUN: 45 mg/dL — ABNORMAL HIGH (ref 8–23)
CO2: 26 mmol/L (ref 22–32)
Calcium: 9.8 mg/dL (ref 8.9–10.3)
Chloride: 103 mmol/L (ref 98–111)
Creatinine, Ser: 1.99 mg/dL — ABNORMAL HIGH (ref 0.61–1.24)
GFR, Estimated: 34 mL/min — ABNORMAL LOW (ref >60)
Glucose, Bld: 108 mg/dL — ABNORMAL HIGH (ref 70–99)
Potassium: 4.3 mmol/L (ref 3.5–5.1)
Sodium: 141 mmol/L (ref 135–145)

## 2024-05-24 MED ORDER — TAMSULOSIN HCL 0.4 MG PO CAPS: 0.4000 mg | ORAL_CAPSULE | Freq: Every day | ORAL | 1 refills | Status: DC

## 2024-05-26 DIAGNOSIS — M5442 Lumbago with sciatica, left side: Secondary | ICD-10-CM | POA: Diagnosis not present

## 2024-05-26 DIAGNOSIS — M5441 Lumbago with sciatica, right side: Secondary | ICD-10-CM | POA: Diagnosis not present

## 2024-05-26 DIAGNOSIS — G8929 Other chronic pain: Secondary | ICD-10-CM | POA: Diagnosis not present

## 2024-05-26 DIAGNOSIS — M542 Cervicalgia: Secondary | ICD-10-CM | POA: Diagnosis not present

## 2024-05-26 DIAGNOSIS — M546 Pain in thoracic spine: Secondary | ICD-10-CM | POA: Diagnosis not present

## 2024-05-26 DIAGNOSIS — M5416 Radiculopathy, lumbar region: Secondary | ICD-10-CM | POA: Diagnosis not present

## 2024-05-27 ENCOUNTER — Encounter: Payer: Self-pay | Admitting: Family Medicine

## 2024-05-28 DIAGNOSIS — M5116 Intervertebral disc disorders with radiculopathy, lumbar region: Secondary | ICD-10-CM | POA: Diagnosis not present

## 2024-05-28 DIAGNOSIS — F17210 Nicotine dependence, cigarettes, uncomplicated: Secondary | ICD-10-CM | POA: Diagnosis not present

## 2024-05-28 DIAGNOSIS — D649 Anemia, unspecified: Secondary | ICD-10-CM | POA: Diagnosis not present

## 2024-05-28 DIAGNOSIS — N400 Enlarged prostate without lower urinary tract symptoms: Secondary | ICD-10-CM | POA: Diagnosis not present

## 2024-05-28 DIAGNOSIS — Z9841 Cataract extraction status, right eye: Secondary | ICD-10-CM | POA: Diagnosis not present

## 2024-05-28 DIAGNOSIS — Z79899 Other long term (current) drug therapy: Secondary | ICD-10-CM | POA: Diagnosis not present

## 2024-05-28 DIAGNOSIS — K219 Gastro-esophageal reflux disease without esophagitis: Secondary | ICD-10-CM | POA: Diagnosis not present

## 2024-05-28 DIAGNOSIS — F5102 Adjustment insomnia: Secondary | ICD-10-CM | POA: Diagnosis not present

## 2024-05-28 DIAGNOSIS — E782 Mixed hyperlipidemia: Secondary | ICD-10-CM | POA: Diagnosis not present

## 2024-05-28 DIAGNOSIS — F419 Anxiety disorder, unspecified: Secondary | ICD-10-CM | POA: Diagnosis not present

## 2024-05-28 DIAGNOSIS — Z7902 Long term (current) use of antithrombotics/antiplatelets: Secondary | ICD-10-CM | POA: Diagnosis not present

## 2024-05-28 DIAGNOSIS — Z7982 Long term (current) use of aspirin: Secondary | ICD-10-CM | POA: Diagnosis not present

## 2024-05-28 DIAGNOSIS — Z7984 Long term (current) use of oral hypoglycemic drugs: Secondary | ICD-10-CM | POA: Diagnosis not present

## 2024-05-28 MED ORDER — AMLODIPINE BESYLATE 2.5 MG PO TABS: 2.5000 mg | ORAL_TABLET | ORAL | 0 refills | Status: DC

## 2024-05-28 MED ORDER — JARDIANCE 25 MG PO TABS: 25.0000 mg | ORAL_TABLET | Freq: Every day | ORAL | 3 refills | Status: DC

## 2024-05-29 ENCOUNTER — Encounter (INDEPENDENT_AMBULATORY_CARE_PROVIDER_SITE_OTHER): Payer: Self-pay | Admitting: Vascular Surgery

## 2024-05-31 ENCOUNTER — Telehealth (INDEPENDENT_AMBULATORY_CARE_PROVIDER_SITE_OTHER): Payer: Self-pay

## 2024-05-31 DIAGNOSIS — M4802 Spinal stenosis, cervical region: Secondary | ICD-10-CM | POA: Diagnosis not present

## 2024-05-31 DIAGNOSIS — M4712 Other spondylosis with myelopathy, cervical region: Secondary | ICD-10-CM | POA: Diagnosis not present

## 2024-05-31 NOTE — Telephone Encounter (Addendum)
 Spoke with the patient to reschedule his RLE leg angio with Dr. Jama.  RLE (06/15/24 - 11:00 am) patient to arrive to the [facility]. Pre-procedure instructions will be sent to Mychart and mailed. Patient was inpatient to get off the phone.

## 2024-06-03 ENCOUNTER — Ambulatory Visit: Payer: Self-pay

## 2024-06-03 NOTE — Telephone Encounter (Signed)
 FYI Only or Action Required?: FYI only for provider.  Patient was last seen in primary care on 03/11/2024 by Sharma Coyer, MD.  Called Nurse Triage reporting Pain.  Symptoms began x couple of weeks.  Interventions attempted: Other: elevation and asperene cream.  Symptoms are: gradually worsening.  Triage Disposition: See PCP When Office is Open (Within 3 Days), Call EMS [phone] Now  Patient/caregiver understands and will follow disposition?: Yes    Copied from CRM #[id number]. Topic: Clinical - Red Word Triage >> Jun 03, 2024  8:04 AM [name]: Red Word that prompted transfer to Nurse Triage: Pain in feet, little swelling   ----------------------------------------------------------------------- From previous Reason for Contact - Scheduling: Patient/patient representative is calling to schedule an appointment. Refer to attachments for appointment information. Reason for Disposition  [1] Loss of speech or garbled speech AND [2] sudden onset AND [3] present now    Patient states his speech has changed x 2 to 3 months ago - stated his stutters a lot more  [1] MODERATE pain (e.g., interferes with normal activities) AND [2] present > 3 days  Answer Assessment - Initial Assessment Questions 1. ONSET: When did the muscle aches or body pains start?      Ongoing and worsening x couple weeks 2. LOCATION: What part of your body is hurting? (e.g., entire body, arms, legs)      Bilateral feet and bilateral hands 3. SEVERITY: How bad is the pain? (Scale 1-10; or mild, moderate, severe)     10/10 pain - pain , numbness and tingling 4. CAUSE: What do you think is causing the pains?     Mild Swelling bilateral, neuropathy 5. FEVER: Do you have a fever? If Yes, ask: What is your temperature, how was it measured, and  when did it start?      no 6. OTHER SYMPTOMS: Do you have any other symptoms? (e.g., chest pain, cold or flu symptoms, rash, weakness, weight loss)      na 7. PREGNANCY: Is there any chance you are pregnant? When was your last menstrual period?     na 8. TRAVEL: Have you traveled out of the country in the last month? (e.g., exposures, travel history)     na  Answer Assessment - Initial Assessment Questions 1. SYMPTOM: What is the main symptom you are concerned about? (e.g., weakness, numbness)     Slurring of speech and stuttering 2. ONSET: When did this start? (e.g., minutes, hours, days; while sleeping)     2 to 3 months  3. LAST NORMAL: When was the last time you (the patient) were normal (no symptoms)?     na 4. PATTERN Does this come and go, or has it been constant since it started?  Is it present now?     na 5. CARDIAC SYMPTOMS: Have you had any of the following symptoms: chest pain, difficulty breathing, palpitations?     no 6. NEUROLOGIC SYMPTOMS: Have you had any of the following symptoms: headache, dizziness, vision loss, double vision, changes in speech, unsteady on your feet?     Unsteady on feet , changes in speech 7. OTHER SYMPTOMS: Do you have any other symptoms?     na 8. PREGNANCY: Is there any chance you are pregnant? When was your last menstrual period?     na  Protocols used: Muscle Aches and Body Pain-A-AH, Neurologic Deficit-A-AH

## 2024-06-04 NOTE — Telephone Encounter (Signed)
 Reviewed telephone report   Agree with urgent evaluation as recommended

## 2024-06-10 ENCOUNTER — Inpatient Hospital Stay
Admission: RE | Admit: 2024-06-10 | Discharge: 2024-06-10 | Disposition: A | Discharge status: Home / Self Care | Payer: Self-pay | Source: Ambulatory Visit | Attending: Neurosurgery | Admitting: Neurosurgery

## 2024-06-10 ENCOUNTER — Ambulatory Visit (INDEPENDENT_AMBULATORY_CARE_PROVIDER_SITE_OTHER): Payer: Medicare (Managed Care) | Admitting: Family Medicine

## 2024-06-10 ENCOUNTER — Inpatient Hospital Stay
Admission: RE | Admit: 2024-06-10 | Discharge: 2024-06-10 | Disposition: A | Discharge status: Home / Self Care | Payer: Self-pay | Source: Ambulatory Visit | Attending: Neurosurgery

## 2024-06-10 ENCOUNTER — Other Ambulatory Visit: Payer: Self-pay | Admitting: Family Medicine

## 2024-06-10 VITALS — BP 111/67 | HR 86 | Resp 16 | Wt 132.0 lb

## 2024-06-10 DIAGNOSIS — M51362 Other intervertebral disc degeneration, lumbar region with discogenic back pain and lower extremity pain: Secondary | ICD-10-CM | POA: Diagnosis not present

## 2024-06-10 DIAGNOSIS — R569 Unspecified convulsions: Secondary | ICD-10-CM

## 2024-06-10 DIAGNOSIS — Z049 Encounter for examination and observation for unspecified reason: Secondary | ICD-10-CM

## 2024-06-10 DIAGNOSIS — I10 Essential (primary) hypertension: Secondary | ICD-10-CM

## 2024-06-10 DIAGNOSIS — M1611 Unilateral primary osteoarthritis, right hip: Secondary | ICD-10-CM

## 2024-06-10 DIAGNOSIS — I6990 Unspecified sequelae of unspecified cerebrovascular disease: Secondary | ICD-10-CM

## 2024-06-10 DIAGNOSIS — E1140 Type 2 diabetes mellitus with diabetic neuropathy, unspecified: Secondary | ICD-10-CM

## 2024-06-10 DIAGNOSIS — M961 Postlaminectomy syndrome, not elsewhere classified: Secondary | ICD-10-CM | POA: Diagnosis not present

## 2024-06-10 DIAGNOSIS — I639 Cerebral infarction, unspecified: Secondary | ICD-10-CM

## 2024-06-10 DIAGNOSIS — E440 Moderate protein-calorie malnutrition: Secondary | ICD-10-CM

## 2024-06-10 DIAGNOSIS — R3981 Functional urinary incontinence: Secondary | ICD-10-CM

## 2024-06-10 DIAGNOSIS — Z7409 Other reduced mobility: Secondary | ICD-10-CM

## 2024-06-10 DIAGNOSIS — I70223 Atherosclerosis of native arteries of extremities with rest pain, bilateral legs: Secondary | ICD-10-CM | POA: Diagnosis not present

## 2024-06-10 DIAGNOSIS — I6930 Unspecified sequelae of cerebral infarction: Secondary | ICD-10-CM

## 2024-06-10 DIAGNOSIS — I739 Peripheral vascular disease, unspecified: Secondary | ICD-10-CM

## 2024-06-10 DIAGNOSIS — R471 Dysarthria and anarthria: Secondary | ICD-10-CM

## 2024-06-10 MED ORDER — FUROSEMIDE 20 MG PO TABS: 20.0000 mg | ORAL_TABLET | Freq: Every day | ORAL | 1 refills | Status: DC

## 2024-06-10 MED ORDER — ENSURE HIGH PROTEIN PO LIQD: 1.0000 | Freq: Three times a day (TID) | ORAL | 3 refills | Status: DC

## 2024-06-10 MED ORDER — TRAMADOL HCL 50 MG PO TABS: 50.0000 mg | ORAL_TABLET | Freq: Three times a day (TID) | ORAL | 0 refills | Status: AC | PRN

## 2024-06-10 MED ORDER — MALE URINAL MISC: 1.0000 | 6 refills | Status: AC | PRN

## 2024-06-10 NOTE — Progress Notes (Addendum)
 Established patient visit   Patient: [name]   DOB: Mar 12, 1946   78 y.o. Male  MRN: [id number] Visit Date: 06/10/2024  Today's healthcare provider: Rockie Agent, MD   Chief Complaint  Patient presents with   Referral    [facility] referral and med refill..Pt wants something for pain.   Subjective     HPI     Referral    Additional comments: HH referral and med refill..Pt wants something for pain.      Last edited by Marylen Odella CROME, CMA on 06/10/2024  1:20 PM.       Discussed the use of AI scribe software for clinical note transcription with the patient, who gave verbal consent to proceed.  History of Present Illness [name] is a 78 year old male with cerebrovascular accident and seizure-like activity who presents with mobility issues and slurred speech. His daughter is his primary caregiver.  He has a history of limited mobility and currently uses a transport chair. He experiences difficulty with activities of daily living, such as dressing and transferring from the chair to the bed. He has experienced falls, including one yesterday and another two weeks ago.  He reports slurred speech and stuttering, ongoing for approximately six months to a year. He is unsure of the cause but notes the slurred speech and stuttering have been ongoing for approximately six months to a year. No recent changes in swallowing ability are noted.  His medical history includes cerebrovascular accident and seizure-like activity. He recalls passing out once and has experienced Bell's palsy in the past, which typically resolves in a few days but leaves him with speech difficulties. He has been evaluated by a neurologist and is scheduled for neck surgery in October.  He experiences neuropathy, which he describes as the 'main thing that's killing me.' He has been on tramadol  for neuropathy pain in the past for eight years without issues, but there is concern about seizure risk. He  also reports using a muscle relaxant, which he finds weak, and has a history of using multiple medications including oxycodone , Ativan , and gabapentin .  His daughter manages his medications due to a history of overdose on benzodiazepines. He has been prescribed Ensure, a nutritional shake, to support his nutritional needs, especially in preparation for his upcoming surgery.     Past Medical History:  Diagnosis Date   Acute right PCA stroke (HCC) 03/12/2018   a.) residual loss of peripheral vision and balance issues   Anemia    Anxiety    Atherosclerosis of aorta (HCC)    Benign prostatic hyperplasia    Bilateral carotid artery stenosis    Cerebral atherosclerosis 03/13/2018   a.) CTA neck 03/13/2018: proximal inferior division RIGHT M2 segment stenosis with normal distal opacification; mod stenosis of the distal RIGHT P2 segment (likely culprit of 03/12/2018 CVA); diffuse small vessel Dz   Cervical spondylosis    Chronic back pain    a.) followed by pain mgmt at [facility]   DDD (degenerative disc disease), lumbar    Depression    Essential hypertension    GERD (gastroesophageal reflux disease)    HCV (hepatitis C virus)    a.) s/p Tx with ledipasvir/sofosbuvir   History of kidney stones    Leg ulcer, left (HCC) 2023   has been seen at the wound clinic   Lesion of BILATERAL parotid glands 03/13/2018   a.) CTA neck 03/13/2018: RIGHT (1.3 x 1.8 x 2.4 cm) and LEFT (  1.5 x 0.9 1.2 cm); b.) CT soft tissue neck 04/05/2021: RIGHT (2.3 x 1.6 cm) and LEFT (2.3 x 1.6 cm)   Long term current use of antithrombotics/antiplatelets    a.) daily DAPT therapy (ASA + clopidogrel )   Lumbar radiculitis    Lumbar spondylosis    Mixed hyperlipidemia    Overdose of benzodiazepine    Overuse of medication 02/07/2022   a.) admitted to [facility] with decreased LOC secondary to overuse of Delta 8 THC gummies   Peripheral vascular disease (HCC)    Polysubstance abuse (HCC)    a.) marijuana + cocaine + heroin +  BZO   Right-sided Bell's palsy    Type 2 diabetes mellitus without complication, without long-term current use of insulin (HCC)    Unresponsive episode 02/07/2022   Unsteady gait    a.) following CVA in 2019; uses ambulation aid (walker) for balance   Urothelial carcinoma (HCC)    Wears dentures    Has full upper.  Does not wear.    Medications: Outpatient Medications Prior to Visit  Medication Sig   pregabalin  (LYRICA ) 75 MG capsule Take 1 capsule (75 mg total) by mouth 2 (two) times daily. (Patient taking differently: Take 75 mg by mouth 3 (three) times daily. 1 in the AM/ 2 at bedtime)   acetaminophen  (TYLENOL ) 500 MG tablet Oral   amLODipine  (NORVASC ) 2.5 MG tablet Take 1 tablet (2.5 mg total) by mouth every morning.   aspirin  EC 81 MG tablet Take 1 tablet (81 mg total) by mouth daily. Swallow whole.   atorvastatin  (LIPITOR ) 10 MG tablet Take 1 tablet (10 mg total) by mouth daily.   Blood Glucose Monitoring Suppl (HEALTHPRO BLOOD GLUCOSE MONITO) w/Device KIT MISCELLANEOUS   citalopram  (CELEXA ) 40 MG tablet Take 40 mg by mouth daily.   clopidogrel  (PLAVIX ) 75 MG tablet Take 75 mg by mouth daily.   Docusate Sodium  (DSS) 100 MG CAPS Oral   DULoxetine  (CYMBALTA ) 60 MG capsule Take 1 capsule (60 mg total) by mouth daily.   folic acid  (FOLVITE ) 1 MG tablet Take 1 tablet (1 mg total) by mouth daily.   gabapentin  (NEURONTIN ) 400 MG capsule Take 1 capsule (400 mg total) by mouth 2 (two) times daily.   JARDIANCE  25 MG TABS tablet Take 1 tablet (25 mg total) by mouth daily.   metFORMIN  (GLUCOPHAGE ) 500 MG tablet Take 1 tablet (500 mg total) by mouth 2 (two) times daily with a meal.   Multiple Vitamins-Minerals (CENTRUM SILVER 50+MEN) TABS Take 1 tablet by mouth daily.   QUEtiapine  (SEROQUEL ) 200 MG tablet Take 200 mg by mouth at bedtime.   tamsulosin  (FLOMAX ) 0.4 MG CAPS capsule Take 1 capsule (0.4 mg total) by mouth at bedtime.   tiZANidine  (ZANAFLEX ) 4 MG capsule TAKE 1 CAPSULE BY MOUTH 3  TIMES DAILY.   [DISCONTINUED] furosemide  (LASIX ) 20 MG tablet Take 20 mg by mouth daily.   No facility-administered medications prior to visit.    Review of Systems      Objective    BP 111/67 (BP Location: Right Arm, Patient Position: Sitting)   Pulse 86   Resp 16   Wt 132 lb (59.9 kg)   SpO2 98%   BMI 23.38 kg/m   BP Readings from Last 3 Encounters:  06/17/24 102/64  06/15/24 (!) 141/68  06/10/24 111/67   Wt Readings from Last 3 Encounters:  06/17/24 130 lb (59 kg)  06/15/24 130 lb (59 kg)  06/10/24 132 lb (59.9 kg)  Physical Exam  Physical Exam CHEST: Clear to auscultation bilaterally. CARDIOVASCULAR: Normal heart sounds. NEUROLOGICAL: Cranial nerves intact, motor strength 4/5 in left upper and lower extremities , tenderness to palpation of lumbar region, pt has significant weakness on right lower extremity, tenderness to movement in lower extremities bilaterally,  no deformities or injuries visualized today, patient speech has occasional slurring of some words     No results found for any visits on 06/10/24.  Assessment & Plan     Problem List Items Addressed This Visit     Atherosclerosis of native arteries of extremity with rest pain (HCC)   Continue Plavix , ASA and vascular follow up       Relevant Medications   furosemide  (LASIX ) 20 MG tablet   Other Relevant Orders   Ambulatory referral to Home Health   DME Wheelchair manual   DDD (degenerative disc disease), lumbar   Chronic back pain  Minimal improvement on gabapentin   Given hx of overdose on oxycodone  and seizures, we discussed risk of lower seizure threshold on tramadol , patient states that tramadol  prescription was effective for 8 years previously  Shared decision making, we will trial 50mg  tramadol  every 8 hours PRN for chronic pain       Relevant Orders   Ambulatory referral to Home Health   DME Wheelchair manual   Essential hypertension - Primary   Chronic hypertension   managed with amlodipine .  good control. 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 Neuropathy is the main issue causing significant discomfort. Recent falls reported, indicating worsening mobility and potential safety concerns. Tramadol  prescribed with caution due to history of seizure-like activity, but previously tolerated without issues. Insurance may limit supply, requiring prior authorization for extended use. - Refer to home health for skilled nursing and physical therapy - Evaluate for powered wheelchair - Prescribe tramadol  50 mg three times daily as needed for pain - Order urinal for use in the car - Request evaluation for rollator      Relevant Orders   Ambulatory referral to Home Health   DME Wheelchair manual   Failed back syndrome, lumbar   Relevant Orders   Ambulatory referral to Home Health   DME Wheelchair manual   Late effect of cerebrovascular accident (CVA)   Hx of CVA with residual intermittent slurring of speech that has become more noticeable lately  Ambulation is difficult resulting in multiple falls  Recommend continue to follow up with neurology  Family is requesting home health, orders placed for PT/OT/aide to help with ADLs  Will also order wheelchair to assist with mobility concerns      Osteoarthritis of hip (Right) (Chronic)   Relevant Orders   Ambulatory referral to Home Health   PAD (peripheral artery disease) (HCC)   Severe neuropathy symptoms, particularly in the feet, causing significant pain and sleep disturbances. Chronic          -  continue lyrica  75mg  in the AM and 150mg  at night  - f/u with  vascular surgery for circulation issues - continue follow up with  neurology for further evaluation - Continue aspirin  81 mg and  Plavix  75 mg daily      Relevant Medications   furosemide  (LASIX ) 20 MG tablet   Other Relevant Orders   Ambulatory referral to Home Health   DME Wheelchair manual   Seizure-like activity [facility])   Relevant Orders   Ambulatory referral to Home Health   Type 2 diabetes mellitus with diabetic neuropathy, without long-term current use of insulin (HCC)   Managed with metformin  and Jardiance . Last A1c was 7.1 in January 2025 chronic - Continue metformin  500 mg twice daily - Continue Jardiance  25 mg daily - needs A1c next visit       Relevant Orders   Ambulatory referral to Home Health   Other Visit Diagnoses       Decreased mobility and endurance       Relevant Orders   Ambulatory referral to Home Health     Dysarthria       Relevant Orders   Ambulatory referral to Home Health     Functional urinary incontinence       Relevant Medications   Incontinence Supplies (MALE URINAL) MISC     Moderate protein-calorie malnutrition (HCC)       Relevant Medications   Nutritional Supplements (ENSURE HIGH PROTEIN) LIQD       Assessment and Plan Assessment & Plan     Speech disturbance with history of cerebrovascular accident and Bell's palsy  Late Effects of CVA  Speech disturbance with cerebrovascular accident and Bell's palsy. Slurred speech persistent for 6 months to a year. Neurologist involved in care. No swallowing difficulties reported. - Refer to speech therapy with home health order  - Continue follow-up with neurologist       Return in about 2 months (around 08/10/2024) for Chronic F/U.         Rockie Agent, MD  [facility] [phone] (phone) [phone] (fax)  [facility]

## 2024-06-11 NOTE — Assessment & Plan Note (Signed)
 Chronic back pain  Minimal improvement on gabapentin   Given hx of overdose on oxycodone  and seizures, we discussed risk of lower seizure threshold on tramadol , patient states that tramadol  prescription was effective for 8 years previously  Shared decision making, we will trial 50mg  tramadol  every 8 hours PRN for chronic pain

## 2024-06-11 NOTE — Assessment & Plan Note (Signed)
 Chronic hypertension  managed with amlodipine .  good control. - Continue amlodipine  2.5 mg daily

## 2024-06-11 NOTE — Progress Notes (Signed)
 Referring Physician:  Dodson Delon FERNS, MD [address] [place],  [place] [address]  Primary Physician:  Cameron Coyer, MD  History of Present Illness: 06/17/2024 [name] is a 78 y.o with a history of CVA in 2019 resulting in speech difficulty, bladder cancer, DM, neuropathy who is here today with a chief complaint of low back pain and neuropathy in his feet. He was seen by Dr. Dodson on 8/6 and at that time was experiencing low back pain with pain traveling down both legs and buttock with neuropathic pain into his feet with associated numbness, tingling and weakness prompting referral to neurosurgery.  Today he reports localized pain in his low back without radiating symptoms and trouble walking. He had a fall about 1.5 weeks ago and feels he is weaker since then. He complains of neuropathy in his feet as well as worsening balance trouble  Of note he was seen by Dr. Norleen Montgomery on 05/31/24 for evaluation of cervical myelopathy and schedule for surgery on 10/7 or 10/9 at [facility]. He has not undergone the recommended dedicated cervical MRI as he has not received a call to schedule this.   He smokes about a half pack of cigarettes per day  Conservative measures:  Physical therapy: Home health therapy started last week Multimodal medical therapy including regular antiinflammatories:  tylenol , cymbalta , gabapentin , lyrica , tizanidine , tramadol   Injections:  history of esi injection before spine surgery in 2013.  Past Surgery:  L5-S1 Fusion in 2013  [name] has symptoms of cervical myelopathy.  The symptoms are causing a significant impact on the patient's life.   Review of Systems:  A 10 point review of systems is negative, except for the pertinent positives and negatives detailed in the HPI.  Past Medical History: Past Medical History:  Diagnosis Date   Acute right PCA stroke (HCC) 03/12/2018   a.) residual loss of peripheral vision and  balance issues   Anemia    Anxiety    Atherosclerosis of aorta (HCC)    Benign prostatic hyperplasia    Bilateral carotid artery stenosis    Cerebral atherosclerosis 03/13/2018   a.) CTA neck 03/13/2018: proximal inferior division RIGHT M2 segment stenosis with normal distal opacification; mod stenosis of the distal RIGHT P2 segment (likely culprit of 03/12/2018 CVA); diffuse small vessel Dz   Cervical spondylosis    Chronic back pain    a.) followed by pain mgmt at [facility]   DDD (degenerative disc disease), lumbar    Depression    Essential hypertension    GERD (gastroesophageal reflux disease)    HCV (hepatitis C virus)    a.) s/p Tx with ledipasvir/sofosbuvir   History of kidney stones    Leg ulcer, left (HCC) 2023   has been seen at the wound clinic   Lesion of BILATERAL parotid glands 03/13/2018   a.) CTA neck 03/13/2018: RIGHT (1.3 x 1.8 x 2.4 cm) and LEFT (1.5 x 0.9 1.2 cm); b.) CT soft tissue neck 04/05/2021: RIGHT (2.3 x 1.6 cm) and LEFT (2.3 x 1.6 cm)   Long term current use of antithrombotics/antiplatelets    a.) daily DAPT therapy (ASA + clopidogrel )   Lumbar radiculitis    Lumbar spondylosis    Mixed hyperlipidemia    Overdose of benzodiazepine    Overuse of medication 02/07/2022   a.) admitted to [facility] with decreased LOC secondary to overuse of Delta 8 THC gummies   Peripheral vascular disease (HCC)    Polysubstance abuse (HCC)  a.) marijuana + cocaine + heroin + BZO   Right-sided Bell's palsy    Type 2 diabetes mellitus without complication, without long-term current use of insulin (HCC)    Unresponsive episode 02/07/2022   Unsteady gait    a.) following CVA in 2019; uses ambulation aid (walker) for balance   Urothelial carcinoma (HCC)    Wears dentures    Has full upper.  Does not wear.    Past Surgical History: Past Surgical History:  Procedure Laterality Date   ANTERIOR LUMBAR FUSION  05/13/2012   Procedure: ANTERIOR LUMBAR FUSION 1 LEVEL;  Surgeon:  Cameron Sprang, MD;  Location: [facility] OR;  Service: Orthopedics;  Laterality: N/A;  ALIF L5-S1   CATARACT EXTRACTION W/PHACO Left 01/01/2021   Procedure: CATARACT EXTRACTION PHACO AND INTRAOCULAR LENS PLACEMENT (IOC) LEFT DIABETIC 2.28 00:24.7;  Surgeon: Cameron Adine Anes, MD;  Location: [facility];  Service: Ophthalmology;  Laterality: Left;  covid + 11-30-20   CATARACT EXTRACTION W/PHACO Right 01/15/2021   Procedure: CATARACT EXTRACTION PHACO AND INTRAOCULAR LENS PLACEMENT (IOC) RIGHT DIABETIC;  Surgeon: Cameron Adine Anes, MD;  Location: [facility];  Service: Ophthalmology;  Laterality: Right;  2.62 0:34.5   CHOLECYSTECTOMY     COLONOSCOPY WITH PROPOFOL  N/A 09/19/2021   Procedure: COLONOSCOPY WITH PROPOFOL ;  Surgeon: Toledo, Ladell POUR, MD;  Location: [facility];  Service: Gastroenterology;  Laterality: N/A;   CYSTOSCOPY W/ RETROGRADES Left 08/04/2018   Procedure: CYSTOSCOPY WITH RETROGRADE PYELOGRAM;  Surgeon: Cameron Glendia BROCKS, MD;  Location: [facility] ORS;  Service: Urology;  Laterality: Left;   CYSTOSCOPY W/ RETROGRADES Left 01/20/2024   Procedure: CYSTOSCOPY, WITH RETROGRADE PYELOGRAM;  Surgeon: Cameron Glendia BROCKS, MD;  Location: [facility] ORS;  Service: Urology;  Laterality: Left;   CYSTOSCOPY W/ URETERAL STENT PLACEMENT Right 07/30/2022   Procedure: CYSTOSCOPY WITH URETERAL STENT REPLACEMENT;  Surgeon: Cameron Glendia BROCKS, MD;  Location: [facility] ORS;  Service: Urology;  Laterality: Right;   CYSTOSCOPY WITH BIOPSY N/A 01/20/2024   Procedure: CYSTOSCOPY and Urethral  WITH BIOPSY;  Surgeon: Cameron Glendia BROCKS, MD;  Location: [facility] ORS;  Service: Urology;  Laterality: N/A;   CYSTOSCOPY WITH INSERTION OF UROLIFT N/A 01/18/2020   Procedure: CYSTOSCOPY WITH INSERTION OF UROLIFT;  Surgeon: Cameron Glendia BROCKS, MD;  Location: [facility];  Service: Urology;  Laterality: N/A;   CYSTOSCOPY WITH RETROGRADE PYELOGRAM, URETEROSCOPY AND STENT PLACEMENT Left 06/18/2022   Procedure: CYSTOSCOPY WITH RETROGRADE  PYELOGRAM;  Surgeon: Cameron Glendia BROCKS, MD;  Location: [facility];  Service: Urology;  Laterality: Left;   CYSTOSCOPY WITH STENT PLACEMENT Left 08/04/2018   Procedure: CYSTOSCOPY WITH STENT PLACEMENT;  Surgeon: Cameron Glendia BROCKS, MD;  Location: [facility];  Service: Urology;  Laterality: Left;   CYSTOSCOPY WITH URETEROSCOPY AND STENT PLACEMENT Right 06/18/2022   Procedure: CYSTOSCOPY WITH URETEROSCOPY AND STENT PLACEMENT;  Surgeon: Cameron Glendia BROCKS, MD;  Location: [facility] OR[facility];  Service: Urology;  Laterality: Right;   EXTRACORPOREAL SHOCK WAVE LITHOTRIPSY  2012   EXTRACORPOREAL SHOCK WAVE LITHOTRIPSY Left 08/20/2018   Procedure: EXTRACORPOREAL SHOCK WAVE LITHOTRIPSY (ESWL);  Surgeon: Penne Knee, MD;  Location: [facility];  Service: Urology;  Laterality: Left; (cancelled)   EXTRACORPOREAL SHOCK WAVE LITHOTRIPSY Left 08/27/2018   Procedure: EXTRACORPOREAL SHOCK WAVE LITHOTRIPSY (ESWL);  Surgeon: Cameron Glendia BROCKS, MD;  Location: [facility];  Service: Urology;  Laterality: Left;   EXTRACORPOREAL SHOCK WAVE LITHOTRIPSY Right 10/15/2018   Procedure: EXTRACORPOREAL SHOCK WAVE LITHOTRIPSY (ESWL);  Surgeon: Francisca Redell BROCKS, MD;  Location: [facility];  Service: Urology;  Laterality:  Right;   KIDNEY STONE SURGERY     LOWER EXTREMITY ANGIOGRAPHY Left 05/04/2024   Procedure: Lower Extremity Angiography;  Surgeon: Jama Cordella [name], MD;  Location: [facility];  Service: Cardiovascular;  Laterality: Left;   LOWER EXTREMITY ANGIOGRAPHY Right 06/15/2024   Procedure: Lower Extremity Angiography;  Surgeon: Jama Cordella [name], MD;  Location: [facility];  Service: Cardiovascular;  Laterality: Right;   NEPHRECTOMY, RADICAL, ROBOT ASSISTED, LAPAROSCOPIC Right 11/21/2022   with ureterectomy   TRANSURETHRAL RESECTION OF BLADDER TUMOR N/A 01/20/2024   Procedure: TURBT (TRANSURETHRAL RESECTION OF BLADDER TUMOR);  Surgeon: Cameron Glendia BROCKS, MD;  Location: [facility];  Service: Urology;  Laterality: N/A;   URETERAL  BIOPSY Right 07/30/2022   Procedure: URETERAL BIOPSY OF RENAL PELVIS;  Surgeon: Cameron Glendia BROCKS, MD;  Location: [facility];  Service: Urology;  Laterality: Right;   URETEROSCOPY Right 07/30/2022   Procedure: URETEROSCOPY;  Surgeon: Cameron Glendia BROCKS, MD;  Location: [facility];  Service: Urology;  Laterality: Right;    Allergies: Allergies as of 06/17/2024 - Review Complete 06/17/2024  Allergen Reaction Noted   Lisinopril  03/11/2024    Medications: Outpatient Encounter Medications as of 06/17/2024  Medication Sig   acetaminophen  (TYLENOL ) 500 MG tablet Oral   amLODipine  (NORVASC ) 2.5 MG tablet Take 1 tablet (2.5 mg total) by mouth every morning.   aspirin  EC 81 MG tablet Take 1 tablet (81 mg total) by mouth daily. Swallow whole.   atorvastatin  (LIPITOR ) 10 MG tablet Take 1 tablet (10 mg total) by mouth daily.   Blood Glucose Monitoring Suppl (HEALTHPRO BLOOD GLUCOSE MONITO) w/Device KIT MISCELLANEOUS   citalopram  (CELEXA ) 40 MG tablet Take 40 mg by mouth daily.   clopidogrel  (PLAVIX ) 75 MG tablet Take 75 mg by mouth daily.   Docusate Sodium  (DSS) 100 MG CAPS Oral   DULoxetine  (CYMBALTA ) 60 MG capsule Take 1 capsule (60 mg total) by mouth daily.   folic acid  (FOLVITE ) 1 MG tablet Take 1 tablet (1 mg total) by mouth daily.   furosemide  (LASIX ) 20 MG tablet Take 1 tablet (20 mg total) by mouth daily.   gabapentin  (NEURONTIN ) 400 MG capsule Take 1 capsule (400 mg total) by mouth 2 (two) times daily.   Incontinence Supplies (MALE URINAL) MISC 1 Device by Does not apply route as needed.   JARDIANCE  25 MG TABS tablet Take 1 tablet (25 mg total) by mouth daily.   metFORMIN  (GLUCOPHAGE ) 500 MG tablet Take 1 tablet (500 mg total) by mouth 2 (two) times daily with a meal.   Multiple Vitamins-Minerals (CENTRUM SILVER 50+MEN) TABS Take 1 tablet by mouth daily.   Nutritional Supplements (ENSURE HIGH PROTEIN) LIQD Take 1 Bottle by mouth 3 (three) times daily.   pregabalin  (LYRICA ) 75 MG capsule Take 1  capsule (75 mg total) by mouth 2 (two) times daily. (Patient taking differently: Take 75 mg by mouth 3 (three) times daily. 1 in the AM/ 2 at bedtime)   QUEtiapine  (SEROQUEL ) 200 MG tablet Take 200 mg by mouth at bedtime.   tamsulosin  (FLOMAX ) 0.4 MG CAPS capsule Take 1 capsule (0.4 mg total) by mouth at bedtime.   tiZANidine  (ZANAFLEX ) 4 MG capsule TAKE 1 CAPSULE BY MOUTH 3 TIMES DAILY.   traMADol  (ULTRAM ) 50 MG tablet Take 1 tablet (50 mg total) by mouth every 8 (eight) hours as needed for up to 7 days.   No facility-administered encounter medications on file as of 06/17/2024.    Social History: Social History   Tobacco  Use   Smoking status: Every Day    Current packs/day: 0.50    Average packs/day: 0.5 packs/day for 53.0 years (26.5 ttl pk-yrs)    Types: Cigarettes   Smokeless tobacco: Never  Vaping Use   Vaping status: Never Used  Substance Use Topics   Alcohol  use: Not Currently   Drug use: Not Currently    Types: Heroin, Cocaine, Marijuana    Comment: 1960's    Family Medical History: Family History  Problem Relation Age of Onset   Stroke Mother    Diabetes Mother    Lung cancer Father    Diabetes Father     Physical Examination: Today's Vitals   06/17/24 0930  BP: 102/64  Weight: 59 kg  Height: 5' 3 (1.6 m)  PainSc: 9   PainLoc: Back   Body mass index is 23.03 kg/m.   General: Patient is well developed, well nourished, calm, collected, and in no apparent distress. Attention to examination is appropriate.  Psychiatric: Patient is non-anxious.  Head:  Pupils equal, round, and reactive to light.  ENT:  Oral mucosa appears well hydrated.  Neck:   Supple.   Respiratory: Patient is breathing without any difficulty.  Extremities: No edema.  Vascular: Palpable dorsal pedal pulses.  Skin:   On exposed skin, there are no abnormal skin lesions.  NEUROLOGICAL:     Awake, alert, oriented to person, place, and time.  Speech is clear and fluent. Fund of  knowledge is appropriate.   Cranial Nerves: Pupils equal round and reactive to light.  Facial tone is symmetric.  Facial sensation is symmetric.   Strength: Side Biceps Triceps Deltoid Interossei Grip Wrist Ext. Wrist Flex.  R 3 3 4+ 5 5 5 5   L 3 3 5 5 5 5 5    Side Iliopsoas Quads Hamstring PF DF EHL  R 5 5 5 5 5 5   L 5 5 5 5 5 5    Reflexes a 1+ and symmetric at the biceps, triceps, brachioradialis, patella and achilles.   Hoffman's is present bilaterally  Clonus is not present.  Toes are down-going.  Bilateral upper and lower extremity sensation is intact to light touch.    Pt presents in a wheelchair    Medical Decision Making  Imaging: 04/26/24 MRI T spine  IMPRESSION:  1.  Multilevel degenerative disease of the lumbar spine superimposed upon a  congenitally diminutive spinal canal with up to moderate spinal canal  stenosis at L2-L3, L3-L4 and L4-L5, slightly progressed at junctional L4-L5  level compared to 09/11/2016. Moderate right foraminal stenosis at L5-S1.   2.  Small posterior annular fissures at L2-L3 and L3-L4 with associated  central disc protrusions at both levels.   3.  No high-grade spinal canal or foraminal stenosis within the thoracic  spine.   4. Degenerative changes of the cervical spine are suboptimally  characterized but notable for suspected severe spinal canal stenosis at  C5-C6 and C6-C7. Consider dedicated MRI of the cervical spine for further  characterization if clinically indicated.   Electronically Signed by:  Anil Vasireddi, MD, [facility]  Electronically Signed on:  05/01/2024 12:58 PM   MRI L spine 04/26/24 IMPRESSION:  1.  Multilevel degenerative disease of the lumbar spine superimposed upon a  congenitally diminutive spinal canal with up to moderate spinal canal  stenosis at L2-L3, L3-L4 and L4-L5, slightly progressed at junctional L4-L5  level compared to 09/11/2016. Moderate right foraminal stenosis at L5-S1.   2.  Small posterior  annular  fissures at L2-L3 and L3-L4 with associated  central disc protrusions at both levels.   3.  No high-grade spinal canal or foraminal stenosis within the thoracic  spine.   4. Degenerative changes of the cervical spine are suboptimally  characterized but notable for suspected severe spinal canal stenosis at  C5-C6 and C6-C7. Consider dedicated MRI of the cervical spine for further  characterization if clinically indicated.   I have personally reviewed the images and agree with the above interpretation.  Assessment and Plan: Mr. Moure is a pleasant 78 y.o. male with cervical myelopathy which has progressed since his fall about a week and a half ago. He has yet to get the recommended cervical MRI as he states no one has called him to schedule this. In talking with his family, it sounds like he has MRI, xrays and further pre-op evaluation on 9/8 in [place]. I encouraged them to keep these appointments and call Dr. Armand office should his symptoms worsen. His presentation to our office and worsening exam were discussed with Dr. Salina.  While he does have low back pain, this is not as significant as his neurologic decline and myelopathy symptoms at this time. We will defer further treatment of this until after his cervical spine is addressed. This can be addressed by Dr. Armand office once he is cleared from surgery or us  but he does not need to see both neurosurgical clinics. We will see him going forward on an as needed basis. He expressed understanding and was in agreement with this plan   Thank you for involving me in the care of this patient.   I spent a total of 63 minutes in both face-to-face and non-face-to-face activities for this visit on the date of this encounter including discussion of his symptoms, extensive chart review, imaging review, discussion with outside provider, and documentation.   Edsel Goods Dept. of Neurosurgery

## 2024-06-11 NOTE — Assessment & Plan Note (Signed)
 Continue Plavix , ASA and vascular follow up

## 2024-06-11 NOTE — Assessment & Plan Note (Signed)
 Hx of CVA with residual intermittent slurring of speech that has become more noticeable lately  Ambulation is difficult resulting in multiple falls  Recommend continue to follow up with neurology  Family is requesting home health, orders placed for PT/OT/aide to help with ADLs  Will also order wheelchair to assist with mobility concerns

## 2024-06-11 NOTE — Assessment & Plan Note (Addendum)
 Severe neuropathy symptoms, particularly in the feet, causing significant pain and sleep disturbances. Chronic          - continue lyrica  75mg  in the AM and 150mg  at night  - f/u with  vascular surgery for circulation issues - continue follow up with  neurology for further evaluation - Continue aspirin  81 mg and Plavix  75 mg daily

## 2024-06-11 NOTE — Assessment & Plan Note (Signed)
 Decreased mobility with chronic pain syndrome, neuropathy, failed back surgery, and hip osteoarthritis Chronic pain syndrome with neuropathy, failed back surgery, and hip osteoarthritis contributing to decreased mobility. Neuropathy is the main issue causing significant discomfort. Recent falls reported, indicating worsening mobility and potential safety concerns. Tramadol  prescribed with caution due to history of seizure-like activity, but previously tolerated without issues. Insurance may limit supply, requiring prior authorization for extended use. - Refer to home health for skilled nursing and physical therapy - Evaluate for powered wheelchair - Prescribe tramadol  50 mg three times daily as needed for pain - Order urinal for use in the car - Request evaluation for rollator

## 2024-06-11 NOTE — Assessment & Plan Note (Signed)
 Managed with metformin  and Jardiance . Last A1c was 7.1 in January 2025 chronic - Continue metformin  500 mg twice daily - Continue Jardiance  25 mg daily - needs A1c next visit

## 2024-06-15 ENCOUNTER — Other Ambulatory Visit: Payer: Self-pay

## 2024-06-15 ENCOUNTER — Ambulatory Visit
Admission: RE | Admit: 2024-06-15 | Discharge: 2024-06-15 | Disposition: A | Discharge status: Home / Self Care | Payer: Medicare (Managed Care) | Attending: Vascular Surgery | Admitting: Vascular Surgery

## 2024-06-15 ENCOUNTER — Encounter: Payer: Self-pay | Admitting: Vascular Surgery

## 2024-06-15 ENCOUNTER — Encounter
Admission: RE | Disposition: A | Discharge status: Home / Self Care | Payer: Self-pay | Source: Home / Self Care | Attending: Vascular Surgery

## 2024-06-15 DIAGNOSIS — I2510 Atherosclerotic heart disease of native coronary artery without angina pectoris: Secondary | ICD-10-CM | POA: Diagnosis not present

## 2024-06-15 DIAGNOSIS — F17210 Nicotine dependence, cigarettes, uncomplicated: Secondary | ICD-10-CM | POA: Insufficient documentation

## 2024-06-15 DIAGNOSIS — I70229 Atherosclerosis of native arteries of extremities with rest pain, unspecified extremity: Secondary | ICD-10-CM | POA: Diagnosis present

## 2024-06-15 DIAGNOSIS — I10 Essential (primary) hypertension: Secondary | ICD-10-CM | POA: Diagnosis not present

## 2024-06-15 DIAGNOSIS — E1151 Type 2 diabetes mellitus with diabetic peripheral angiopathy without gangrene: Secondary | ICD-10-CM | POA: Diagnosis not present

## 2024-06-15 DIAGNOSIS — I70221 Atherosclerosis of native arteries of extremities with rest pain, right leg: Secondary | ICD-10-CM

## 2024-06-15 DIAGNOSIS — Z95828 Presence of other vascular implants and grafts: Secondary | ICD-10-CM

## 2024-06-15 DIAGNOSIS — Z7984 Long term (current) use of oral hypoglycemic drugs: Secondary | ICD-10-CM | POA: Diagnosis not present

## 2024-06-15 DIAGNOSIS — E782 Mixed hyperlipidemia: Secondary | ICD-10-CM | POA: Diagnosis not present

## 2024-06-15 DIAGNOSIS — I6523 Occlusion and stenosis of bilateral carotid arteries: Secondary | ICD-10-CM | POA: Diagnosis not present

## 2024-06-15 DIAGNOSIS — Z79899 Other long term (current) drug therapy: Secondary | ICD-10-CM | POA: Insufficient documentation

## 2024-06-15 DIAGNOSIS — Z9889 Other specified postprocedural states: Secondary | ICD-10-CM | POA: Diagnosis not present

## 2024-06-15 HISTORY — PX: LOWER EXTREMITY ANGIOGRAPHY: CATH118251

## 2024-06-15 LAB — BUN: BUN: 32 mg/dL — ABNORMAL HIGH (ref 8–23)

## 2024-06-15 LAB — GLUCOSE, CAPILLARY
Glucose-Capillary: 102 mg/dL — ABNORMAL HIGH (ref 70–99)
Glucose-Capillary: 89 mg/dL (ref 70–99)

## 2024-06-15 LAB — CREATININE, SERUM
Creatinine, Ser: 1.54 mg/dL — ABNORMAL HIGH (ref 0.61–1.24)
GFR, Estimated: 46 mL/min — ABNORMAL LOW (ref >60)

## 2024-06-15 SURGERY — LOWER EXTREMITY ANGIOGRAPHY
Anesthesia: Moderate Sedation | Site: Leg Lower | Laterality: Right

## 2024-06-15 MED ORDER — HEPARIN SODIUM (PORCINE) 1000 UNIT/ML IJ SOLN
INTRAMUSCULAR | Status: DC | PRN
Start: 2024-06-15 — End: 2024-06-15
  Administered 2024-06-15: 6000 [IU] via INTRAVENOUS

## 2024-06-15 MED ORDER — SODIUM CHLORIDE 0.9 % IV SOLN: INTRAVENOUS | Status: DC

## 2024-06-15 MED ORDER — CEFAZOLIN SODIUM-DEXTROSE 2-4 GM/100ML-% IV SOLN
INTRAVENOUS | Status: AC
  Filled 2024-06-15: qty 100

## 2024-06-15 MED ORDER — METHYLPREDNISOLONE SODIUM SUCC 125 MG IJ SOLR: 125.0000 mg | Freq: Once | INTRAMUSCULAR | Status: DC | PRN

## 2024-06-15 MED ORDER — HYDROMORPHONE HCL 1 MG/ML IJ SOLN: 1.0000 mg | Freq: Once | INTRAMUSCULAR | Status: DC | PRN

## 2024-06-15 MED ORDER — IODIXANOL 320 MG/ML IV SOLN
INTRAVENOUS | Status: DC | PRN
  Administered 2024-06-15: 50 mL via INTRA_ARTERIAL

## 2024-06-15 MED ORDER — HEPARIN (PORCINE) IN NACL 2000-0.9 UNIT/L-% IV SOLN
INTRAVENOUS | Status: DC | PRN
  Administered 2024-06-15: 1000 mL

## 2024-06-15 MED ORDER — ACETAMINOPHEN 325 MG PO TABS: 650.0000 mg | ORAL_TABLET | ORAL | Status: DC | PRN

## 2024-06-15 MED ORDER — HEPARIN (PORCINE) IN NACL 1000-0.9 UT/500ML-% IV SOLN
INTRAVENOUS | Status: DC | PRN
Start: 2024-06-15 — End: 2024-06-15
  Administered 2024-06-15: 1000 mL

## 2024-06-15 MED ORDER — SODIUM CHLORIDE 0.9 % IV SOLN: 250.0000 mL | INTRAVENOUS | Status: DC | PRN

## 2024-06-15 MED ORDER — SODIUM CHLORIDE 0.9% FLUSH: 3.0000 mL | Freq: Two times a day (BID) | INTRAVENOUS | Status: DC

## 2024-06-15 MED ORDER — HEPARIN SODIUM (PORCINE) 1000 UNIT/ML IJ SOLN
INTRAMUSCULAR | Status: AC
Start: 2024-06-15 — End: 2024-06-15
  Filled 2024-06-15: qty 10

## 2024-06-15 MED ORDER — MORPHINE SULFATE (PF) 2 MG/ML IV SOLN: 2.0000 mg | INTRAVENOUS | Status: DC | PRN

## 2024-06-15 MED ORDER — OXYCODONE HCL 5 MG PO TABS: 5.0000 mg | ORAL_TABLET | ORAL | Status: DC | PRN

## 2024-06-15 MED ORDER — MIDAZOLAM HCL 5 MG/5ML IJ SOLN
INTRAMUSCULAR | Status: AC
  Filled 2024-06-15: qty 5

## 2024-06-15 MED ORDER — MIDAZOLAM HCL 2 MG/2ML IJ SOLN
INTRAMUSCULAR | Status: DC | PRN
  Administered 2024-06-15: .5 mg via INTRAVENOUS
  Administered 2024-06-15: 1 mg via INTRAVENOUS
  Administered 2024-06-15: 2 mg via INTRAVENOUS
  Administered 2024-06-15: .5 mg via INTRAVENOUS

## 2024-06-15 MED ORDER — SODIUM CHLORIDE 0.9% FLUSH
3.0000 mL | INTRAVENOUS | Status: DC | PRN
Start: 2024-06-15 — End: 2024-06-15

## 2024-06-15 MED ORDER — LIDOCAINE HCL (PF) 1 % IJ SOLN
INTRAMUSCULAR | Status: DC | PRN
  Administered 2024-06-15: 10 mL

## 2024-06-15 MED ORDER — FENTANYL CITRATE (PF) 100 MCG/2ML IJ SOLN
INTRAMUSCULAR | Status: AC
  Filled 2024-06-15: qty 2

## 2024-06-15 MED ORDER — HYDRALAZINE HCL 20 MG/ML IJ SOLN: 5.0000 mg | INTRAMUSCULAR | Status: DC | PRN

## 2024-06-15 MED ORDER — DIPHENHYDRAMINE HCL 50 MG/ML IJ SOLN
INTRAMUSCULAR | Status: DC | PRN
  Administered 2024-06-15: 25 mg via INTRAVENOUS

## 2024-06-15 MED ORDER — FENTANYL CITRATE PF 50 MCG/ML IJ SOSY
PREFILLED_SYRINGE | INTRAMUSCULAR | Status: AC
  Filled 2024-06-15: qty 1

## 2024-06-15 MED ORDER — ONDANSETRON HCL 4 MG/2ML IJ SOLN: 4.0000 mg | Freq: Four times a day (QID) | INTRAMUSCULAR | Status: DC | PRN

## 2024-06-15 MED ORDER — CEFAZOLIN SODIUM-DEXTROSE 2-4 GM/100ML-% IV SOLN
2.0000 g | INTRAVENOUS | Status: AC
  Administered 2024-06-15: 2 g via INTRAVENOUS

## 2024-06-15 MED ORDER — MIDAZOLAM HCL 2 MG/ML PO SYRP: 8.0000 mg | ORAL_SOLUTION | Freq: Once | ORAL | Status: DC | PRN

## 2024-06-15 MED ORDER — DIPHENHYDRAMINE HCL 50 MG/ML IJ SOLN: 50.0000 mg | Freq: Once | INTRAMUSCULAR | Status: DC | PRN

## 2024-06-15 MED ORDER — FENTANYL CITRATE (PF) 100 MCG/2ML IJ SOLN
INTRAMUSCULAR | Status: DC | PRN
  Administered 2024-06-15: 50 ug via INTRAVENOUS
  Administered 2024-06-15 (×3): 25 ug via INTRAVENOUS

## 2024-06-15 MED ORDER — LABETALOL HCL 5 MG/ML IV SOLN: 10.0000 mg | INTRAVENOUS | Status: DC | PRN

## 2024-06-15 MED ORDER — FAMOTIDINE 20 MG PO TABS: 40.0000 mg | ORAL_TABLET | Freq: Once | ORAL | Status: DC | PRN

## 2024-06-15 MED ORDER — DIPHENHYDRAMINE HCL 50 MG/ML IJ SOLN
INTRAMUSCULAR | Status: AC
  Filled 2024-06-15: qty 1

## 2024-06-15 SURGICAL SUPPLY — 26 items
BALLOON LUTONIX 4X220X130 (BALLOONS) IMPLANT
BALLOON LUTONIX 5X150X130 (BALLOONS) IMPLANT
BALLOON LUTONIX DCB 4X100X130 (BALLOONS) IMPLANT
BALLOON LUTONIX DCB 5X40X130 (BALLOONS) IMPLANT
CATH ANGIO 5F PIGTAIL 65CM (CATHETERS) IMPLANT
CATH BEACON 5 .038 100 VERT TP (CATHETERS) IMPLANT
CATH ROTAREX 135 6FR (CATHETERS) IMPLANT
CATH TEMPO 5F RIM 65CM (CATHETERS) IMPLANT
COVER PROBE ULTRASOUND 5X96 (MISCELLANEOUS) IMPLANT
DEVICE PRESTO INFLATION (MISCELLANEOUS) IMPLANT
DEVICE STARCLOSE SE CLOSURE (Vascular Products) IMPLANT
GLIDEWIRE ADV .035X260CM (WIRE) IMPLANT
GOWN STRL REUS W/ TWL LRG LVL3 (GOWN DISPOSABLE) ×1 IMPLANT
NDL ENTRY 21GA 7CM ECHOTIP (NEEDLE) IMPLANT
NEEDLE ENTRY 21GA 7CM ECHOTIP (NEEDLE) ×1 IMPLANT
PACK ANGIOGRAPHY (CUSTOM PROCEDURE TRAY) ×1 IMPLANT
SET INTRO CAPELLA COAXIAL (SET/KITS/TRAYS/PACK) IMPLANT
SHEATH ANL2 6FRX45 HC (SHEATH) IMPLANT
SHEATH BRITE TIP 5FRX11 (SHEATH) IMPLANT
STENT LIFESTENT 5F 6X150X135 (Permanent Stent) IMPLANT
STENT LIFESTENT 5F 6X40X135 (Permanent Stent) IMPLANT
SYR MEDRAD MARK 7 150ML (SYRINGE) IMPLANT
TUBING CONTRAST HIGH PRESS 72 (TUBING) IMPLANT
WIRE G V18X300CM (WIRE) IMPLANT
WIRE J 3MM .035X145CM (WIRE) IMPLANT
WIRE SUPRACORE 300CM (WIRE) IMPLANT

## 2024-06-15 NOTE — Op Note (Addendum)
 [facility]  Percutaneous Study/Intervention Procedural Note   Date of Surgery: 06/15/2024  Surgeon:  Cordella JUDITHANN Shawl, MD.  Pre-operative Diagnosis: Atherosclerotic occlusive disease bilateral lower extremities with rest pain of the right lower extremity  Post-operative diagnosis:  Same  Procedure(s) Performed:             1.  Introduction catheter into right lower extremity 3rd order catheter placement              2.    Contrast injection right lower extremity for distal runoff             3.  Percutaneous transluminal angioplasty and stent placement right superficial femoral and popliteal artery.             4.  Atherectomy right SFA and popliteal with the [place] thromboatherectomy catheter.              5.   Star close closure left common femoral arteriotomy  Anesthesia: Conscious sedation was administered under my direct supervision by the interventional radiology RN. IV Versed  plus fentanyl  were utilized. Continuous ECG, pulse oximetry and blood pressure was monitored throughout the entire procedure.  Conscious sedation was for a total of 78 minutes.  Sheath: 6 [place] Ansell left common femoral artery retrograde  Contrast: 50 cc  Fluoroscopy Time: 11.5 minutes  Indications:  Lemond LULLA Lukes presents with rest pain of the right lower extremity.  The patient has known atherosclerotic occlusive disease. Noninvasive studies as well as physical examination demonstrate significant atherosclerotic changes. This places the patient at increased risk for limb loss. Angiography with the hope for intervention for limb salvage is recommended.  The risks and benefits are reviewed all questions answered patient agrees to proceed.  Procedure:  JOSAIAH MUHAMMED is a 78 y.o. y.o. male who was identified and appropriate procedural time out was performed.  The patient was then placed supine on the table and prepped and draped in the usual sterile fashion.    Ultrasound was  placed in the sterile sleeve and the left groin was evaluated the left common femoral artery was echolucent and pulsatile indicating patency.  Image was recorded for the permanent record and under real-time visualization a microneedle was inserted into the common femoral artery microwire followed by a micro-sheath.  A J-wire was then advanced through the micro-sheath and a  5 [place] sheath was then inserted over a J-wire. J-wire was then advanced and a 5 French rib catheter was positioned at the level of the aortic bifurcation.  An LAO view of the pelvis was obtained.  Subsequently the rim catheter with the stiff angle Glidewire was used to cross the aortic bifurcation the catheter wire were advanced down into the right distal external iliac artery. Oblique view of the femoral bifurcation was then obtained and subsequently the wire was reintroduced and the pigtail catheter negotiated into the SFA representing third order catheter placement. Distal runoff was then performed.  6000 units of heparin  was then given and allowed to circulate and a 6 [place] Ansell sheath was advanced up and over the bifurcation and positioned in the femoral artery  KMP  catheter and stiff angle Glidewire were then negotiated down into the distal SFA.  Using the [place] and wire the SFA popliteal occlusion was crossed and the catheter was advanced.  Hand-injection of contrast demonstrated intraluminal positioning in the mid to distal popliteal.  The V18 wire was then reintroduced and the [place] catheter was  prepped on the field and advanced over the V18 wire.  Atherectomy of the SFA and popliteal was then performed.  A total of 3 passes were made.  Follow-up images demonstrated successful recanalization of the occlusion but with greater than 60% residual stenosis.  There was also a 80 to 90% more proximal lesion.  Working from proximally to distally reimaging of the proximal SFA was performed and a 4 mm Lutonix drug-eluting balloon  was used to treat the proximal lesion.  I then used 4 mm Lutonix drug-eluting balloons to treat the popliteal and distal SFA lesion as well.  Inflations were to 8 to 10 atm for approximately 1 minute.  Follow-up imaging demonstrated greater than 40% residual stenosis at both locations.  The detector was repositioned more proximally and a magnified image was obtained a 6 mm x 40 mm life stent was then deployed and postdilated with a 5 mm x 40 mm Lutonix drug-eluting balloon inflated to 10 atm for approximately 1 minute.  Next a 6 mm x 150 mm life stent was deployed from the level of the femoral condyles proximally.  It was then postdilated with a 5 mm x 150 mm Lutonix drug-eluting balloon inflated to 10 atm for approximately 1 minute.  Because the stented elongated slightly the proximal end was not covered by the Lutonix balloon and a 5 mm x 40 mm Lutonix drug-eluting balloon was used to cover this area.  Again the inflation was to 10 atm for approximately 1 minute.  Beginning proximally hand-injection of contrast was now performed and demonstrated the SFA popliteal are widely patent with less than 10% residual stenosis.  Trifurcation remains patent and unchanged compared to intervention.  After review of these images the sheath is pulled into the left external iliac oblique of the common femoral is obtained and a Star close device deployed. There no immediate complications.   Findings:  The abdominal aorta was recently imaged and is patent without hemodynamically significant stenosis.  The common iliac artery stents that were recently placed are both widely patent.  The external iliac artery on the right is widely patent.   The right common femoral is widely patent as is the profunda femoris.  The SFA does indeed have a significant stenosis in its proximal one third of 80 to 90% this is fairly focal.  Beginning at [facility] canal the SFA occludes in the popliteal remains occluded down to its midportion.  The  distal popliteal is free of hemodynamically significant stenosis and the trifurcation is patent with three-vessel runoff to the foot.    After atherectomy there is successful recanalization of the SFA popliteal.  Following angioplasty and stent placement the SFA and popliteal is now patent with in-line flow and looks quite nice and less than 10% residual stenosis.  Three-vessel runoff is preserved.     Summary: Successful recanalization right lower extremity for limb salvage                        Disposition: Patient was taken to the recovery room in stable condition having tolerated the procedure well.  Lamanda Rudder, Cordella [name] 06/15/2024,2:59 PM

## 2024-06-15 NOTE — Interval H&P Note (Signed)
 History and Physical Interval Note:  06/15/2024 12:58 PM  [name]  has presented today for surgery, with the diagnosis of RLE Angio   ASO w rest pain.  The various methods of treatment have been discussed with the patient and family. After consideration of risks, benefits and other options for treatment, the patient has consented to  Procedure(s): Lower Extremity Angiography (Right) as a surgical intervention.  The patient's history has been reviewed, patient examined, no change in status, stable for surgery.  I have reviewed the patient's chart and labs.  Questions were answered to the patient's satisfaction.     Cordella Shawl

## 2024-06-16 ENCOUNTER — Encounter: Payer: Self-pay | Admitting: Vascular Surgery

## 2024-06-17 ENCOUNTER — Encounter: Payer: Self-pay | Admitting: Neurosurgery

## 2024-06-17 ENCOUNTER — Ambulatory Visit: Payer: Medicare (Managed Care) | Admitting: Neurosurgery

## 2024-06-17 VITALS — BP 102/64 | Ht 63.0 in | Wt 130.0 lb

## 2024-06-17 DIAGNOSIS — G9589 Other specified diseases of spinal cord: Secondary | ICD-10-CM

## 2024-06-17 DIAGNOSIS — R2689 Other abnormalities of gait and mobility: Secondary | ICD-10-CM

## 2024-06-17 DIAGNOSIS — R262 Difficulty in walking, not elsewhere classified: Secondary | ICD-10-CM | POA: Diagnosis not present

## 2024-06-17 DIAGNOSIS — R531 Weakness: Secondary | ICD-10-CM

## 2024-06-17 DIAGNOSIS — W19XXXA Unspecified fall, initial encounter: Secondary | ICD-10-CM | POA: Diagnosis not present

## 2024-06-17 DIAGNOSIS — M545 Low back pain, unspecified: Secondary | ICD-10-CM

## 2024-06-17 DIAGNOSIS — M4712 Other spondylosis with myelopathy, cervical region: Secondary | ICD-10-CM

## 2024-06-22 ENCOUNTER — Other Ambulatory Visit: Payer: Self-pay | Admitting: Family Medicine

## 2024-06-22 ENCOUNTER — Ambulatory Visit: Admit: 2024-06-22 | Payer: Medicare (Managed Care) | Admitting: Vascular Surgery

## 2024-06-22 DIAGNOSIS — I739 Peripheral vascular disease, unspecified: Secondary | ICD-10-CM

## 2024-06-22 DIAGNOSIS — M961 Postlaminectomy syndrome, not elsewhere classified: Secondary | ICD-10-CM

## 2024-06-22 DIAGNOSIS — M1611 Unilateral primary osteoarthritis, right hip: Secondary | ICD-10-CM

## 2024-06-22 SURGERY — LOWER EXTREMITY ANGIOGRAPHY
Anesthesia: Moderate Sedation | Site: Leg Lower | Laterality: Right

## 2024-06-28 DIAGNOSIS — Z8619 Personal history of other infectious and parasitic diseases: Secondary | ICD-10-CM | POA: Diagnosis not present

## 2024-06-28 DIAGNOSIS — Z01818 Encounter for other preprocedural examination: Secondary | ICD-10-CM | POA: Diagnosis not present

## 2024-06-28 DIAGNOSIS — E1140 Type 2 diabetes mellitus with diabetic neuropathy, unspecified: Secondary | ICD-10-CM | POA: Diagnosis not present

## 2024-06-28 DIAGNOSIS — N183 Chronic kidney disease, stage 3 unspecified: Secondary | ICD-10-CM | POA: Diagnosis not present

## 2024-06-28 DIAGNOSIS — F17200 Nicotine dependence, unspecified, uncomplicated: Secondary | ICD-10-CM | POA: Diagnosis not present

## 2024-06-28 DIAGNOSIS — Z720 Tobacco use: Secondary | ICD-10-CM | POA: Diagnosis not present

## 2024-06-28 DIAGNOSIS — Z8673 Personal history of transient ischemic attack (TIA), and cerebral infarction without residual deficits: Secondary | ICD-10-CM | POA: Diagnosis not present

## 2024-06-28 DIAGNOSIS — T424X4D Poisoning by benzodiazepines, undetermined, subsequent encounter: Secondary | ICD-10-CM | POA: Diagnosis not present

## 2024-06-28 DIAGNOSIS — J4489 Other specified chronic obstructive pulmonary disease: Secondary | ICD-10-CM | POA: Diagnosis not present

## 2024-06-28 DIAGNOSIS — G959 Disease of spinal cord, unspecified: Secondary | ICD-10-CM | POA: Diagnosis not present

## 2024-06-28 DIAGNOSIS — I10 Essential (primary) hypertension: Secondary | ICD-10-CM | POA: Diagnosis not present

## 2024-06-28 DIAGNOSIS — N400 Enlarged prostate without lower urinary tract symptoms: Secondary | ICD-10-CM | POA: Diagnosis not present

## 2024-06-28 DIAGNOSIS — R296 Repeated falls: Secondary | ICD-10-CM | POA: Diagnosis not present

## 2024-06-28 DIAGNOSIS — R2689 Other abnormalities of gait and mobility: Secondary | ICD-10-CM | POA: Diagnosis not present

## 2024-06-28 DIAGNOSIS — F1190 Opioid use, unspecified, uncomplicated: Secondary | ICD-10-CM | POA: Diagnosis not present

## 2024-06-28 DIAGNOSIS — M5031 Other cervical disc degeneration,  high cervical region: Secondary | ICD-10-CM | POA: Diagnosis not present

## 2024-06-28 DIAGNOSIS — H53452 Other localized visual field defect, left eye: Secondary | ICD-10-CM | POA: Diagnosis not present

## 2024-06-28 DIAGNOSIS — M9971 Connective tissue and disc stenosis of intervertebral foramina of cervical region: Secondary | ICD-10-CM | POA: Diagnosis not present

## 2024-06-28 DIAGNOSIS — R569 Unspecified convulsions: Secondary | ICD-10-CM | POA: Diagnosis not present

## 2024-06-28 DIAGNOSIS — F332 Major depressive disorder, recurrent severe without psychotic features: Secondary | ICD-10-CM | POA: Diagnosis not present

## 2024-06-28 DIAGNOSIS — M50223 Other cervical disc displacement at C6-C7 level: Secondary | ICD-10-CM | POA: Diagnosis not present

## 2024-06-28 DIAGNOSIS — M79604 Pain in right leg: Secondary | ICD-10-CM | POA: Diagnosis not present

## 2024-06-28 DIAGNOSIS — M79605 Pain in left leg: Secondary | ICD-10-CM | POA: Diagnosis not present

## 2024-06-28 DIAGNOSIS — N179 Acute kidney failure, unspecified: Secondary | ICD-10-CM | POA: Diagnosis not present

## 2024-06-28 DIAGNOSIS — Z981 Arthrodesis status: Secondary | ICD-10-CM | POA: Diagnosis not present

## 2024-06-28 DIAGNOSIS — I6523 Occlusion and stenosis of bilateral carotid arteries: Secondary | ICD-10-CM | POA: Diagnosis not present

## 2024-06-29 ENCOUNTER — Other Ambulatory Visit (INDEPENDENT_AMBULATORY_CARE_PROVIDER_SITE_OTHER): Payer: Self-pay | Admitting: Vascular Surgery

## 2024-06-29 DIAGNOSIS — Z9889 Other specified postprocedural states: Secondary | ICD-10-CM

## 2024-07-01 ENCOUNTER — Encounter (INDEPENDENT_AMBULATORY_CARE_PROVIDER_SITE_OTHER): Payer: Self-pay | Admitting: Vascular Surgery

## 2024-07-01 ENCOUNTER — Ambulatory Visit (INDEPENDENT_AMBULATORY_CARE_PROVIDER_SITE_OTHER): Payer: Medicare (Managed Care) | Admitting: Vascular Surgery

## 2024-07-01 ENCOUNTER — Other Ambulatory Visit (INDEPENDENT_AMBULATORY_CARE_PROVIDER_SITE_OTHER): Payer: Medicare (Managed Care)

## 2024-07-01 VITALS — BP 137/67 | HR 67 | Ht 64.0 in | Wt 130.0 lb

## 2024-07-01 DIAGNOSIS — I6523 Occlusion and stenosis of bilateral carotid arteries: Secondary | ICD-10-CM

## 2024-07-01 DIAGNOSIS — I739 Peripheral vascular disease, unspecified: Secondary | ICD-10-CM

## 2024-07-01 DIAGNOSIS — M5116 Intervertebral disc disorders with radiculopathy, lumbar region: Secondary | ICD-10-CM | POA: Diagnosis not present

## 2024-07-01 DIAGNOSIS — Z9889 Other specified postprocedural states: Secondary | ICD-10-CM

## 2024-07-01 DIAGNOSIS — I70223 Atherosclerosis of native arteries of extremities with rest pain, bilateral legs: Secondary | ICD-10-CM

## 2024-07-01 DIAGNOSIS — R262 Difficulty in walking, not elsewhere classified: Secondary | ICD-10-CM | POA: Diagnosis not present

## 2024-07-01 DIAGNOSIS — E782 Mixed hyperlipidemia: Secondary | ICD-10-CM | POA: Diagnosis not present

## 2024-07-01 DIAGNOSIS — E119 Type 2 diabetes mellitus without complications: Secondary | ICD-10-CM

## 2024-07-01 DIAGNOSIS — I10 Essential (primary) hypertension: Secondary | ICD-10-CM | POA: Diagnosis not present

## 2024-07-01 DIAGNOSIS — M6281 Muscle weakness (generalized): Secondary | ICD-10-CM | POA: Diagnosis not present

## 2024-07-04 ENCOUNTER — Encounter (INDEPENDENT_AMBULATORY_CARE_PROVIDER_SITE_OTHER): Payer: Self-pay | Admitting: Vascular Surgery

## 2024-07-04 NOTE — Progress Notes (Signed)
 MRN : [id number]  [name] is a 78 y.o. (07-19-1946) male who presents with chief complaint of check circulation.  History of Present Illness:   The patient returns to the office for followup and review status post angiogram with intervention on 06/15/2024.   Procedure:  Percutaneous transluminal angioplasty and stent placement right superficial femoral and popliteal artery. 2.   Atherectomy right SFA and popliteal with the [place] thromboatherectomy catheter.   The patient notes improvement in the lower extremity symptoms. No interval shortening of the patient's claudication distance or rest pain symptoms. No new ulcers or wounds have occurred since the last visit.  There have been no significant changes to the patient's overall health care.  No documented history of amaurosis fugax or recent TIA symptoms. There are no recent neurological changes noted. No documented history of DVT, PE or superficial thrombophlebitis. The patient denies recent episodes of angina or shortness of breath.   ABI's Rt=1.11 and Lt=1.02  (previous ABI's Rt=0.68 and Lt=0.53)   Current Meds  Medication Sig   acetaminophen  (TYLENOL ) 500 MG tablet Oral   amLODipine  (NORVASC ) 2.5 MG tablet Take 1 tablet (2.5 mg total) by mouth every morning.   aspirin  EC 81 MG tablet Take 1 tablet (81 mg total) by mouth daily. Swallow whole.   atorvastatin  (LIPITOR ) 10 MG tablet Take 1 tablet (10 mg total) by mouth daily.   Blood Glucose Monitoring Suppl (HEALTHPRO BLOOD GLUCOSE MONITO) w/Device KIT MISCELLANEOUS   citalopram  (CELEXA ) 40 MG tablet Take 40 mg by mouth daily.   clopidogrel  (PLAVIX ) 75 MG tablet Take 75 mg by mouth daily.   Docusate Sodium  (DSS) 100 MG CAPS Oral   DULoxetine  (CYMBALTA ) 60 MG capsule Take 1 capsule (60 mg total) by mouth daily.   folic acid  (FOLVITE ) 1 MG tablet Take 1 tablet (1 mg total) by mouth daily.   furosemide   (LASIX ) 20 MG tablet Take 1 tablet (20 mg total) by mouth daily.   Incontinence Supplies (MALE URINAL) MISC 1 Device by Does not apply route as needed.   JARDIANCE  25 MG TABS tablet Take 1 tablet (25 mg total) by mouth daily.   metFORMIN  (GLUCOPHAGE ) 500 MG tablet Take 1 tablet (500 mg total) by mouth 2 (two) times daily with a meal.   Multiple Vitamins-Minerals (CENTRUM SILVER 50+MEN) TABS Take 1 tablet by mouth daily.   Nutritional Supplements (ENSURE HIGH PROTEIN) LIQD Take 1 Bottle by mouth 3 (three) times daily.   pregabalin  (LYRICA ) 75 MG capsule Take 1 capsule (75 mg total) by mouth 2 (two) times daily.   QUEtiapine  (SEROQUEL ) 200 MG tablet Take 200 mg by mouth at bedtime.   tamsulosin  (FLOMAX ) 0.4 MG CAPS capsule Take 1 capsule (0.4 mg total) by mouth at bedtime.   tiZANidine  (ZANAFLEX ) 4 MG capsule TAKE 1 CAPSULE BY MOUTH 3 TIMES DAILY.    Past Medical History:  Diagnosis Date   Acute right PCA stroke (HCC) 03/12/2018   a.) residual loss of peripheral vision and balance issues   Anemia    Anxiety    Atherosclerosis of aorta (HCC)  Benign prostatic hyperplasia    Bilateral carotid artery stenosis    Cerebral atherosclerosis 03/13/2018   a.) CTA neck 03/13/2018: proximal inferior division RIGHT M2 segment stenosis with normal distal opacification; mod stenosis of the distal RIGHT P2 segment (likely culprit of 03/12/2018 CVA); diffuse small vessel Dz   Cervical spondylosis    Chronic back pain    a.) followed by pain mgmt at [facility]   DDD (degenerative disc disease), lumbar    Depression    Essential hypertension    GERD (gastroesophageal reflux disease)    HCV (hepatitis C virus)    a.) s/p Tx with ledipasvir/sofosbuvir   History of kidney stones    Leg ulcer, left (HCC) 2023   has been seen at the wound clinic   Lesion of BILATERAL parotid glands 03/13/2018   a.) CTA neck 03/13/2018: RIGHT (1.3 x 1.8 x 2.4 cm) and LEFT (1.5 x 0.9 1.2 cm); b.) CT soft tissue neck 04/05/2021:  RIGHT (2.3 x 1.6 cm) and LEFT (2.3 x 1.6 cm)   Long term current use of antithrombotics/antiplatelets    a.) daily DAPT therapy (ASA + clopidogrel )   Lumbar radiculitis    Lumbar spondylosis    Mixed hyperlipidemia    Overdose of benzodiazepine    Overuse of medication 02/07/2022   a.) admitted to [facility] with decreased LOC secondary to overuse of Delta 8 THC gummies   Peripheral vascular disease (HCC)    Polysubstance abuse (HCC)    a.) marijuana + cocaine + heroin + BZO   Right-sided Bell's palsy    Type 2 diabetes mellitus without complication, without long-term current use of insulin (HCC)    Unresponsive episode 02/07/2022   Unsteady gait    a.) following CVA in 2019; uses ambulation aid (walker) for balance   Urothelial carcinoma (HCC)    Wears dentures    Has full upper.  Does not wear.    Past Surgical History:  Procedure Laterality Date   ANTERIOR LUMBAR FUSION  05/13/2012   Procedure: ANTERIOR LUMBAR FUSION 1 LEVEL;  Surgeon: Donaciano Sprang, MD;  Location: [facility];  Service: Orthopedics;  Laterality: N/A;  ALIF L5-S1   CATARACT EXTRACTION W/PHACO Left 01/01/2021   Procedure: CATARACT EXTRACTION PHACO AND INTRAOCULAR LENS PLACEMENT (IOC) LEFT DIABETIC 2.28 00:24.7;  Surgeon: Myrna Adine Anes, MD;  Location: [facility];  Service: Ophthalmology;  Laterality: Left;  covid + 11-30-20   CATARACT EXTRACTION W/PHACO Right 01/15/2021   Procedure: CATARACT EXTRACTION PHACO AND INTRAOCULAR LENS PLACEMENT (IOC) RIGHT DIABETIC;  Surgeon: Myrna Adine Anes, MD;  Location: [facility];  Service: Ophthalmology;  Laterality: Right;  2.62 0:34.5   CHOLECYSTECTOMY     COLONOSCOPY WITH PROPOFOL  N/A 09/19/2021   Procedure: COLONOSCOPY WITH PROPOFOL ;  Surgeon: Toledo, Ladell POUR, MD;  Location: [facility];  Service: Gastroenterology;  Laterality: N/A;   CYSTOSCOPY W/ RETROGRADES Left 08/04/2018   Procedure: CYSTOSCOPY WITH RETROGRADE PYELOGRAM;  Surgeon: Twylla Glendia BROCKS, MD;   Location: [facility];  Service: Urology;  Laterality: Left;   CYSTOSCOPY W/ RETROGRADES Left 01/20/2024   Procedure: CYSTOSCOPY, WITH RETROGRADE PYELOGRAM;  Surgeon: Twylla Glendia BROCKS, MD;  Location: [facility];  Service: Urology;  Laterality: Left;   CYSTOSCOPY W/ URETERAL STENT PLACEMENT Right 07/30/2022   Procedure: CYSTOSCOPY WITH URETERAL STENT REPLACEMENT;  Surgeon: Twylla Glendia BROCKS, MD;  Location: [facility];  Service: Urology;  Laterality: Right;   CYSTOSCOPY WITH BIOPSY N/A 01/20/2024   Procedure: CYSTOSCOPY and Urethral  WITH BIOPSY;  Surgeon: Twylla,  Glendia BROCKS, MD;  Location: [facility];  Service: Urology;  Laterality: N/A;   CYSTOSCOPY WITH INSERTION OF UROLIFT N/A 01/18/2020   Procedure: CYSTOSCOPY WITH INSERTION OF UROLIFT;  Surgeon: Twylla Glendia BROCKS, MD;  Location: [facility];  Service: Urology;  Laterality: N/A;   CYSTOSCOPY WITH RETROGRADE PYELOGRAM, URETEROSCOPY AND STENT PLACEMENT Left 06/18/2022   Procedure: CYSTOSCOPY WITH RETROGRADE PYELOGRAM;  Surgeon: Twylla Glendia BROCKS, MD;  Location: [facility];  Service: Urology;  Laterality: Left;   CYSTOSCOPY WITH STENT PLACEMENT Left 08/04/2018   Procedure: CYSTOSCOPY WITH STENT PLACEMENT;  Surgeon: Twylla Glendia BROCKS, MD;  Location: [facility];  Service: Urology;  Laterality: Left;   CYSTOSCOPY WITH URETEROSCOPY AND STENT PLACEMENT Right 06/18/2022   Procedure: CYSTOSCOPY WITH URETEROSCOPY AND STENT PLACEMENT;  Surgeon: Twylla Glendia BROCKS, MD;  Location: [facility];  Service: Urology;  Laterality: Right;   EXTRACORPOREAL SHOCK WAVE LITHOTRIPSY  2012   EXTRACORPOREAL SHOCK WAVE LITHOTRIPSY Left 08/20/2018   Procedure: EXTRACORPOREAL SHOCK WAVE LITHOTRIPSY (ESWL);  Surgeon: Penne Knee, MD;  Location: [facility];  Service: Urology;  Laterality: Left; (cancelled)   EXTRACORPOREAL SHOCK WAVE LITHOTRIPSY Left 08/27/2018   Procedure: EXTRACORPOREAL SHOCK WAVE LITHOTRIPSY (ESWL);  Surgeon: Twylla Glendia BROCKS, MD;  Location: [facility];  Service: Urology;  Laterality:  Left;   EXTRACORPOREAL SHOCK WAVE LITHOTRIPSY Right 10/15/2018   Procedure: EXTRACORPOREAL SHOCK WAVE LITHOTRIPSY (ESWL);  Surgeon: Francisca Redell BROCKS, MD;  Location: [facility];  Service: Urology;  Laterality: Right;   KIDNEY STONE SURGERY     LOWER EXTREMITY ANGIOGRAPHY Left 05/04/2024   Procedure: Lower Extremity Angiography;  Surgeon: Jama Cordella [name], MD;  Location: [facility];  Service: Cardiovascular;  Laterality: Left;   LOWER EXTREMITY ANGIOGRAPHY Right 06/15/2024   Procedure: Lower Extremity Angiography;  Surgeon: Jama Cordella [name], MD;  Location: [facility];  Service: Cardiovascular;  Laterality: Right;   NEPHRECTOMY, RADICAL, ROBOT ASSISTED, LAPAROSCOPIC Right 11/21/2022   with ureterectomy   TRANSURETHRAL RESECTION OF BLADDER TUMOR N/A 01/20/2024   Procedure: TURBT (TRANSURETHRAL RESECTION OF BLADDER TUMOR);  Surgeon: Twylla Glendia BROCKS, MD;  Location: [facility];  Service: Urology;  Laterality: N/A;   URETERAL BIOPSY Right 07/30/2022   Procedure: URETERAL BIOPSY OF RENAL PELVIS;  Surgeon: Twylla Glendia BROCKS, MD;  Location: [facility];  Service: Urology;  Laterality: Right;   URETEROSCOPY Right 07/30/2022   Procedure: URETEROSCOPY;  Surgeon: Twylla Glendia BROCKS, MD;  Location: [facility];  Service: Urology;  Laterality: Right;    Social History Social History   Tobacco Use   Smoking status: Every Day    Current packs/day: 0.50    Average packs/day: 0.5 packs/day for 53.0 years (26.5 ttl pk-yrs)    Types: Cigarettes   Smokeless tobacco: Never  Vaping Use   Vaping status: Never Used  Substance Use Topics   Alcohol  use: Not Currently   Drug use: Not Currently    Types: Heroin, Cocaine, Marijuana    Comment: 1960's    Family History Family History  Problem Relation Age of Onset   Stroke Mother    Diabetes Mother    Lung cancer Father    Diabetes Father     Allergies  Allergen Reactions   Lisinopril      REVIEW OF SYSTEMS (Negative unless  checked)  Constitutional: [] Weight loss  [] Fever  [] Chills Cardiac: [] Chest pain   [] Chest pressure   [] Palpitations   [] Shortness of breath when laying flat   [] Shortness of breath with exertion. Vascular:  [x] Pain in legs  with walking   [] Pain in legs at rest  [] History of DVT   [] Phlebitis   [] Swelling in legs   [] Varicose veins   [] Non-healing ulcers Pulmonary:   [] Uses home oxygen   [] Productive cough   [] Hemoptysis   [] Wheeze  [] COPD   [] Asthma Neurologic:  [] Dizziness   [] Seizures   [] History of stroke   [] History of TIA  [] Aphasia   [] Vissual changes   [] Weakness or numbness in arm   [] Weakness or numbness in leg Musculoskeletal:   [] Joint swelling   [] Joint pain   [] Low back pain Hematologic:  [] Easy bruising  [] Easy bleeding   [] Hypercoagulable state   [] Anemic Gastrointestinal:  [] Diarrhea   [] Vomiting  [] Gastroesophageal reflux/heartburn   [] Difficulty swallowing. Genitourinary:  [] Chronic kidney disease   [] Difficult urination  [] Frequent urination   [] Blood in urine Skin:  [] Rashes   [] Ulcers  Psychological:  [] History of anxiety   []  History of major depression.  Physical Examination  Vitals:   07/01/24 1523  BP: 137/67  Pulse: 67  Weight: 130 lb (59 kg)  Height: 5' 4 (1.626 m)   Body mass index is 22.31 kg/m. Gen: WD/WN, NAD Head: [place]/AT, No temporalis wasting.  Ear/Nose/Throat: Hearing grossly intact, nares w/o erythema or drainage Eyes: PER, EOMI, sclera nonicteric.  Neck: Supple, no masses.  No bruit or JVD.  Pulmonary:  Good air movement, no audible wheezing, no use of accessory muscles.  Cardiac: RRR, normal S1, S2, no Murmurs. Vascular:  mild trophic changes, no open wounds Vessel Right Left  Radial Palpable Palpable  PT Palpable Palpable  DP Not Palpable Not Palpable  Gastrointestinal: soft, non-distended. No guarding/no peritoneal signs.  Musculoskeletal: M/S 5/5 throughout.  No visible deformity.  Neurologic: CN 2-12 intact. Pain and light touch intact  in extremities.  Symmetrical.  Speech is fluent. Motor exam as listed above. Psychiatric: Judgment intact, Mood & affect appropriate for pt's clinical situation. Dermatologic: No rashes or ulcers noted.  No changes consistent with cellulitis.   CBC Lab Results  Component Value Date   WBC 11.5 (H) 01/13/2024   HGB 15.4 01/13/2024   HCT 46.1 01/13/2024   MCV 90.4 01/13/2024   PLT 254 01/13/2024    BMET    Component Value Date/Time   NA 141 05/24/2024 0947   K 4.3 05/24/2024 0947   CL 103 05/24/2024 0947   CO2 26 05/24/2024 0947   GLUCOSE 108 (H) 05/24/2024 0947   BUN 32 (H) 06/15/2024 0756   BUN 10 05/21/2018 1020   CREATININE 1.54 (H) 06/15/2024 0756   CALCIUM  9.8 05/24/2024 0947   GFRNONAA 46 (L) 06/15/2024 0756   GFRAA >60 06/03/2018 1831   Estimated Creatinine Clearance: 34.1 mL/min (A) (by C-G formula based on SCr of 1.54 mg/dL (H)).  COAG Lab Results  Component Value Date   INR 1.08 05/07/2012    Radiology PERIPHERAL VASCULAR CATHETERIZATION Result Date: 06/15/2024 See surgical note for result.    Assessment/Plan 1. Atherosclerosis of native artery of both lower extremities with rest pain (HCC) (Primary) Recommend:  The patient is status post successful angiogram with intervention.  The patient reports that the claudication symptoms and leg pain has improved.   The patient denies lifestyle limiting changes at this point in time.  No further invasive studies, angiography or surgery at this time. The patient should continue walking and begin a more formal exercise program.  The patient should continue antiplatelet therapy and aggressive treatment of the lipid abnormalities  Continued surveillance is indicated  as atherosclerosis is likely to progress with time.    Patient should undergo noninvasive studies as ordered. The patient will follow up with me to review the studies.  - VAS US  LOWER EXTREMITY ARTERIAL DUPLEX; Future - VAS US  ABI WITH/WO TBI;  Future  2. Bilateral carotid artery stenosis Recommend:  Given the patient's asymptomatic subcritical stenosis no further invasive testing or surgery at this time.  Duplex ultrasound shows 1-39% stenosis bilaterally.  Continue antiplatelet therapy as prescribed Continue management of CAD, HTN and Hyperlipidemia Healthy heart diet,  encouraged exercise at least 4 times per week  Follow up in 12 months with duplex ultrasound and physical exam   3. Essential hypertension Continue antihypertensive medications as already ordered, these medications have been reviewed and there are no changes at this time.  4. Diabetes mellitus type 2, diet-controlled (HCC) Continue hypoglycemic medications as already ordered, these medications have been reviewed and there are no changes at this time.  Hgb A1C to be monitored as already arranged by primary service  5. Mixed hyperlipidemia Continue statin as ordered and reviewed, no changes at this time    Cordella Shawl, MD  07/04/2024 12:31 PM

## 2024-07-05 DIAGNOSIS — F17210 Nicotine dependence, cigarettes, uncomplicated: Secondary | ICD-10-CM | POA: Diagnosis not present

## 2024-07-05 DIAGNOSIS — Z9841 Cataract extraction status, right eye: Secondary | ICD-10-CM | POA: Diagnosis not present

## 2024-07-05 DIAGNOSIS — N400 Enlarged prostate without lower urinary tract symptoms: Secondary | ICD-10-CM | POA: Diagnosis not present

## 2024-07-05 DIAGNOSIS — Z7902 Long term (current) use of antithrombotics/antiplatelets: Secondary | ICD-10-CM | POA: Diagnosis not present

## 2024-07-05 DIAGNOSIS — M5116 Intervertebral disc disorders with radiculopathy, lumbar region: Secondary | ICD-10-CM | POA: Diagnosis not present

## 2024-07-05 DIAGNOSIS — F5102 Adjustment insomnia: Secondary | ICD-10-CM | POA: Diagnosis not present

## 2024-07-05 DIAGNOSIS — K219 Gastro-esophageal reflux disease without esophagitis: Secondary | ICD-10-CM | POA: Diagnosis not present

## 2024-07-05 DIAGNOSIS — Z7982 Long term (current) use of aspirin: Secondary | ICD-10-CM | POA: Diagnosis not present

## 2024-07-05 DIAGNOSIS — D649 Anemia, unspecified: Secondary | ICD-10-CM | POA: Diagnosis not present

## 2024-07-05 DIAGNOSIS — Z79899 Other long term (current) drug therapy: Secondary | ICD-10-CM | POA: Diagnosis not present

## 2024-07-05 DIAGNOSIS — Z7984 Long term (current) use of oral hypoglycemic drugs: Secondary | ICD-10-CM | POA: Diagnosis not present

## 2024-07-05 DIAGNOSIS — E782 Mixed hyperlipidemia: Secondary | ICD-10-CM | POA: Diagnosis not present

## 2024-07-05 DIAGNOSIS — F419 Anxiety disorder, unspecified: Secondary | ICD-10-CM | POA: Diagnosis not present

## 2024-07-05 LAB — VAS US ABI WITH/WO TBI
Left ABI: 1.02
Right ABI: 1.11

## 2024-07-07 ENCOUNTER — Other Ambulatory Visit: Payer: Self-pay | Admitting: Family Medicine

## 2024-07-07 DIAGNOSIS — R600 Localized edema: Secondary | ICD-10-CM | POA: Diagnosis not present

## 2024-07-07 DIAGNOSIS — M5136 Other intervertebral disc degeneration, lumbar region with discogenic back pain only: Secondary | ICD-10-CM

## 2024-07-07 DIAGNOSIS — R9389 Abnormal findings on diagnostic imaging of other specified body structures: Secondary | ICD-10-CM | POA: Diagnosis not present

## 2024-07-08 ENCOUNTER — Telehealth (INDEPENDENT_AMBULATORY_CARE_PROVIDER_SITE_OTHER): Payer: Self-pay | Admitting: Vascular Surgery

## 2024-07-08 NOTE — Telephone Encounter (Signed)
 Marjorie from pre-admission clinic called regarding assessment form that was faxed last week.  [phone] (phone) [phone] (fax)

## 2024-07-09 ENCOUNTER — Other Ambulatory Visit: Payer: Self-pay

## 2024-07-09 ENCOUNTER — Encounter: Payer: Self-pay | Admitting: Family Medicine

## 2024-07-09 MED ORDER — CLOPIDOGREL BISULFATE 75 MG PO TABS: 75.0000 mg | ORAL_TABLET | Freq: Every day | ORAL | 1 refills | Status: AC

## 2024-07-09 NOTE — Telephone Encounter (Signed)
 Requesting clearance form will be refax to the office

## 2024-07-12 DIAGNOSIS — C678 Malignant neoplasm of overlapping sites of bladder: Secondary | ICD-10-CM | POA: Diagnosis not present

## 2024-07-12 DIAGNOSIS — C641 Malignant neoplasm of right kidney, except renal pelvis: Secondary | ICD-10-CM | POA: Diagnosis not present

## 2024-07-13 DIAGNOSIS — G8929 Other chronic pain: Secondary | ICD-10-CM | POA: Diagnosis not present

## 2024-07-13 DIAGNOSIS — G959 Disease of spinal cord, unspecified: Secondary | ICD-10-CM | POA: Diagnosis not present

## 2024-07-13 DIAGNOSIS — R29898 Other symptoms and signs involving the musculoskeletal system: Secondary | ICD-10-CM | POA: Diagnosis not present

## 2024-07-13 DIAGNOSIS — Z8673 Personal history of transient ischemic attack (TIA), and cerebral infarction without residual deficits: Secondary | ICD-10-CM | POA: Diagnosis not present

## 2024-07-13 DIAGNOSIS — E889 Metabolic disorder, unspecified: Secondary | ICD-10-CM | POA: Diagnosis not present

## 2024-07-13 DIAGNOSIS — G63 Polyneuropathy in diseases classified elsewhere: Secondary | ICD-10-CM | POA: Diagnosis not present

## 2024-07-13 DIAGNOSIS — R2689 Other abnormalities of gait and mobility: Secondary | ICD-10-CM | POA: Diagnosis not present

## 2024-07-13 DIAGNOSIS — R42 Dizziness and giddiness: Secondary | ICD-10-CM | POA: Diagnosis not present

## 2024-07-13 DIAGNOSIS — W19XXXA Unspecified fall, initial encounter: Secondary | ICD-10-CM | POA: Diagnosis not present

## 2024-07-13 DIAGNOSIS — R569 Unspecified convulsions: Secondary | ICD-10-CM | POA: Diagnosis not present

## 2024-07-13 DIAGNOSIS — M51362 Other intervertebral disc degeneration, lumbar region with discogenic back pain and lower extremity pain: Secondary | ICD-10-CM | POA: Diagnosis not present

## 2024-07-13 DIAGNOSIS — M545 Low back pain, unspecified: Secondary | ICD-10-CM | POA: Diagnosis not present

## 2024-07-14 DIAGNOSIS — Z01818 Encounter for other preprocedural examination: Secondary | ICD-10-CM | POA: Diagnosis not present

## 2024-07-14 DIAGNOSIS — R600 Localized edema: Secondary | ICD-10-CM | POA: Diagnosis not present

## 2024-07-14 DIAGNOSIS — R9389 Abnormal findings on diagnostic imaging of other specified body structures: Secondary | ICD-10-CM | POA: Diagnosis not present

## 2024-07-15 NOTE — Telephone Encounter (Signed)
 [name] called stating that they willing to wait until Monday for Dr Jama to fill out the assessment.

## 2024-07-15 NOTE — Telephone Encounter (Addendum)
 Kendra left voicemail requesting again regarding the assessment form.

## 2024-07-19 NOTE — Telephone Encounter (Signed)
 Requesting clearance has been faxed

## 2024-07-22 NOTE — Telephone Encounter (Signed)
 Marjorie left a voicemail on Wed, 07/21/2024 stating that they did received the risk assessment. However, she needed to know when patient will have his next follow up and ultrasounds.  Called Marjorie and she was notified of the requested information.

## 2024-07-26 ENCOUNTER — Telehealth (INDEPENDENT_AMBULATORY_CARE_PROVIDER_SITE_OTHER): Payer: Self-pay | Admitting: Vascular Surgery

## 2024-07-26 NOTE — Telephone Encounter (Signed)
 Patient was notified with medical recommendations and verbalized that he will keep his upcoming appointment as scheduled.

## 2024-07-26 NOTE — Telephone Encounter (Addendum)
 Patient was informed with medical recommendations on vascular standpoint for vascular risk assessment for surgery. Patient did request for sooner appt.  Per Dr Jama he had a long segment occlusion of his SFA and was stented. He needs 4 months minimum of dual antiplatelet therapy. He can come in and see me sooner but is not going to change the 4 months. If he does want to come in and see me sooner I would get an ABI with his visit. I did reach out to the patient but was not able to leave a voicemail

## 2024-07-26 NOTE — Telephone Encounter (Signed)
 Pt LVM on [facility] line asking why his surgery w/GS was stopped. Would like a call back from nurse or GS.

## 2024-08-07 ENCOUNTER — Other Ambulatory Visit: Payer: Self-pay | Admitting: Family Medicine

## 2024-08-10 ENCOUNTER — Encounter: Payer: Self-pay | Admitting: Family Medicine

## 2024-08-10 ENCOUNTER — Ambulatory Visit (INDEPENDENT_AMBULATORY_CARE_PROVIDER_SITE_OTHER): Payer: Medicare (Managed Care) | Admitting: Family Medicine

## 2024-08-10 VITALS — BP 108/61 | HR 71 | Temp 98.1°F | Ht 64.0 in | Wt 130.0 lb

## 2024-08-10 DIAGNOSIS — Z8673 Personal history of transient ischemic attack (TIA), and cerebral infarction without residual deficits: Secondary | ICD-10-CM

## 2024-08-10 DIAGNOSIS — I739 Peripheral vascular disease, unspecified: Secondary | ICD-10-CM | POA: Diagnosis not present

## 2024-08-10 DIAGNOSIS — Z23 Encounter for immunization: Secondary | ICD-10-CM | POA: Diagnosis not present

## 2024-08-10 DIAGNOSIS — F3289 Other specified depressive episodes: Secondary | ICD-10-CM

## 2024-08-10 DIAGNOSIS — R270 Ataxia, unspecified: Secondary | ICD-10-CM | POA: Diagnosis not present

## 2024-08-10 DIAGNOSIS — I69923 Fluency disorder following unspecified cerebrovascular disease: Secondary | ICD-10-CM

## 2024-08-10 DIAGNOSIS — F5104 Psychophysiologic insomnia: Secondary | ICD-10-CM

## 2024-08-10 DIAGNOSIS — Z7984 Long term (current) use of oral hypoglycemic drugs: Secondary | ICD-10-CM

## 2024-08-10 DIAGNOSIS — M545 Low back pain, unspecified: Secondary | ICD-10-CM | POA: Diagnosis not present

## 2024-08-10 DIAGNOSIS — G8929 Other chronic pain: Secondary | ICD-10-CM

## 2024-08-10 DIAGNOSIS — B351 Tinea unguium: Secondary | ICD-10-CM

## 2024-08-10 DIAGNOSIS — I10 Essential (primary) hypertension: Secondary | ICD-10-CM

## 2024-08-10 DIAGNOSIS — R600 Localized edema: Secondary | ICD-10-CM | POA: Diagnosis not present

## 2024-08-10 DIAGNOSIS — R569 Unspecified convulsions: Secondary | ICD-10-CM

## 2024-08-10 DIAGNOSIS — F419 Anxiety disorder, unspecified: Secondary | ICD-10-CM

## 2024-08-10 DIAGNOSIS — F17200 Nicotine dependence, unspecified, uncomplicated: Secondary | ICD-10-CM

## 2024-08-10 DIAGNOSIS — E119 Type 2 diabetes mellitus without complications: Secondary | ICD-10-CM

## 2024-08-10 DIAGNOSIS — Y92009 Unspecified place in unspecified non-institutional (private) residence as the place of occurrence of the external cause: Secondary | ICD-10-CM

## 2024-08-10 DIAGNOSIS — E1140 Type 2 diabetes mellitus with diabetic neuropathy, unspecified: Secondary | ICD-10-CM

## 2024-08-10 DIAGNOSIS — E538 Deficiency of other specified B group vitamins: Secondary | ICD-10-CM | POA: Diagnosis not present

## 2024-08-10 DIAGNOSIS — M51362 Other intervertebral disc degeneration, lumbar region with discogenic back pain and lower extremity pain: Secondary | ICD-10-CM

## 2024-08-10 DIAGNOSIS — E559 Vitamin D deficiency, unspecified: Secondary | ICD-10-CM

## 2024-08-10 MED ORDER — PREGABALIN 150 MG PO CAPS: 150.0000 mg | ORAL_CAPSULE | Freq: Two times a day (BID) | ORAL | 0 refills | Status: DC

## 2024-08-10 NOTE — Progress Notes (Signed)
 "     Established patient visit   Patient: [name]   DOB: December 12, 1945   78 y.o. Male  MRN: [id number] Visit Date: 08/10/2024  Today's healthcare provider: Rockie Agent, MD   Chief Complaint  Patient presents with   Medical Management of Chronic Issues    Patient present for follow up of chronic issues. Reports still having pain in both feet. Would like to receive flu vaccine. Reports that he will see vascular surgery on 12/1 and has finished cancer tx   Medication Problem    Reports that when folic acid  is filled at pharmacy insurance will not cover, not sure if documentation is needed. OTC options are not cheaper    Subjective     HPI     Medical Management of Chronic Issues    Additional comments: Patient present for follow up of chronic issues. Reports still having pain in both feet. Would like to receive flu vaccine. Reports that he will see vascular surgery on 12/1 and has finished cancer tx        Medication Problem    Additional comments: Reports that when folic acid  is filled at pharmacy insurance will not cover, not sure if documentation is needed. OTC options are not cheaper       Last edited by Cherry Chiquita HERO, CMA on 08/10/2024  9:35 AM.       Discussed the use of AI scribe software for clinical note transcription with the patient, who gave verbal consent to proceed.  History of Present Illness [name] is a 78 year old male with type two diabetes and diabetic neuropathy who presents for chronic follow-up.  He experiences severe pain in his feet, which is persistent and exacerbated by wearing shoes. He has been on gabapentin  400 mg twice daily and Cymbalta  60 mg daily with minimal relief. A switch to Lyrica  75 mg twice daily has provided limited benefit. He also experiences stuttering and occasional loss of feeling in his hands, leading to dropping objects. These symptoms have persisted for over six months to a year.  He has a history of  degenerative disc disease and lumbar facet syndrome, as well as chronic back pain. Back surgery was planned but delayed because the vascular surgeon did not clear him and he is currently on Plavix  after vascular stenting. The vascular surgeon did not clear him for surgery and he needs to continue aspirin  and Plavix  therapy for four months after stenting.  He has a history of cerebrovascular accident, hypertension, chronic hip pain, insomnia, nephrolithiasis, opiate use, polysubstance abuse, seizure-like activity, and moderate THC dependence. He is on Seroquel  200 mg at bedtime for insomnia and Flomax  0.4 mg at bedtime for BPH. He also takes tizanidine  4 mg three times daily for chronic pain and Remicade 250 mg twice daily for seizure history.  He reports a recent mild seizure episode where he fell and hit his head on a bedpost, but denies any current headache or vision changes. He has a history of B12 deficiency and has taken B12 supplements in the past, both orally and via injections.  He has been receiving home health services for physical therapy to prepare for surgery, but these services were discontinued. The therapy involved exercises to improve mobility and strength, but he refused assistance with personal care.  He continues to smoke and received a flu vaccine during this visit.     Past Medical History:  Diagnosis Date   Acute right PCA stroke (HCC) 03/12/2018  a.) residual loss of peripheral vision and balance issues   Anemia    Anxiety    Atherosclerosis of aorta    Benign prostatic hyperplasia    Bilateral carotid artery stenosis    Cerebral atherosclerosis 03/13/2018   a.) CTA neck 03/13/2018: proximal inferior division RIGHT M2 segment stenosis with normal distal opacification; mod stenosis of the distal RIGHT P2 segment (likely culprit of 03/12/2018 CVA); diffuse small vessel Dz   Cervical spondylosis    Chronic back pain    a.) followed by pain mgmt at [facility]   DDD  (degenerative disc disease), lumbar    Depression    Essential hypertension    GERD (gastroesophageal reflux disease)    HCV (hepatitis C virus)    a.) s/p Tx with ledipasvir/sofosbuvir   History of kidney stones    Leg ulcer, left (HCC) 2023   has been seen at the wound clinic   Lesion of BILATERAL parotid glands 03/13/2018   a.) CTA neck 03/13/2018: RIGHT (1.3 x 1.8 x 2.4 cm) and LEFT (1.5 x 0.9 1.2 cm); b.) CT soft tissue neck 04/05/2021: RIGHT (2.3 x 1.6 cm) and LEFT (2.3 x 1.6 cm)   Long term current use of antithrombotics/antiplatelets    a.) daily DAPT therapy (ASA + clopidogrel )   Lumbar radiculitis    Lumbar spondylosis    Mixed hyperlipidemia    Overdose of benzodiazepine    Overuse of medication 02/07/2022   a.) admitted to [facility] with decreased LOC secondary to overuse of Delta 8 THC gummies   Peripheral vascular disease    Polysubstance abuse (HCC)    a.) marijuana + cocaine + heroin + BZO   Right-sided Bell's palsy    Type 2 diabetes mellitus without complication, without long-term current use of insulin (HCC)    Unresponsive episode 02/07/2022   Unsteady gait    a.) following CVA in 2019; uses ambulation aid (walker) for balance   Urothelial carcinoma (HCC)    Wears dentures    Has full upper.  Does not wear.    Medications: Outpatient Medications Prior to Visit  Medication Sig   acetaminophen  (TYLENOL ) 500 MG tablet Oral   amLODipine  (NORVASC ) 2.5 MG tablet Take 1 tablet (2.5 mg total) by mouth every morning.   aspirin  EC 81 MG tablet Take 1 tablet (81 mg total) by mouth daily. Swallow whole.   atorvastatin  (LIPITOR ) 10 MG tablet Take 1 tablet (10 mg total) by mouth daily.   Blood Glucose Monitoring Suppl (HEALTHPRO BLOOD GLUCOSE MONITO) w/Device KIT MISCELLANEOUS   citalopram  (CELEXA ) 40 MG tablet Take 40 mg by mouth daily.   clopidogrel  (PLAVIX ) 75 MG tablet Take 1 tablet (75 mg total) by mouth daily.   Docusate Sodium  (DSS) 100 MG CAPS Oral   DULoxetine   (CYMBALTA ) 60 MG capsule Take 1 capsule (60 mg total) by mouth daily.   folic acid  (FOLVITE ) 1 MG tablet Take 1 tablet (1 mg total) by mouth daily.   furosemide  (LASIX ) 20 MG tablet TAKE 1 TABLET BY MOUTH EVERY DAY   Incontinence Supplies (MALE URINAL) MISC 1 Device by Does not apply route as needed.   JARDIANCE  25 MG TABS tablet Take 1 tablet (25 mg total) by mouth daily.   metFORMIN  (GLUCOPHAGE ) 500 MG tablet Take 1 tablet (500 mg total) by mouth 2 (two) times daily with a meal.   Multiple Vitamins-Minerals (CENTRUM SILVER 50+MEN) TABS Take 1 tablet by mouth daily.   Nutritional Supplements (ENSURE HIGH PROTEIN) LIQD Take 1 Bottle  by mouth 3 (three) times daily.   QUEtiapine  (SEROQUEL ) 200 MG tablet Take 200 mg by mouth at bedtime.   tamsulosin  (FLOMAX ) 0.4 MG CAPS capsule Take 1 capsule (0.4 mg total) by mouth at bedtime.   tiZANidine  (ZANAFLEX ) 4 MG capsule TAKE 1 CAPSULE BY MOUTH 3 TIMES DAILY.   [DISCONTINUED] gabapentin  (NEURONTIN ) 400 MG capsule Take 1 capsule (400 mg total) by mouth 2 (two) times daily.   [DISCONTINUED] pregabalin  (LYRICA ) 75 MG capsule Take 1 capsule (75 mg total) by mouth 2 (two) times daily.   Cholecalciferol (VITAMIN D-1000 MAX ST) 25 MCG (1000 UT) tablet Take 1,000 Units by mouth daily.   divalproex (DEPAKOTE) 250 MG DR tablet Take 250 mg by mouth 2 (two) times daily.   No facility-administered medications prior to visit.    Review of Systems  Last metabolic panel Lab Results  Component Value Date   GLUCOSE 85 08/10/2024   NA 141 08/10/2024   K 5.3 (H) 08/10/2024   CL 100 08/10/2024   CO2 21 08/10/2024   BUN 43 (H) 08/10/2024   CREATININE 2.01 (H) 08/10/2024   GFRNONAA 46 (L) 06/15/2024   CALCIUM  11.2 (H) 08/10/2024   PHOS 3.2 10/10/2020   PROT 8.6 (H) 08/31/2022   ALBUMIN 4.4 08/31/2022   BILITOT 0.6 08/31/2022   ALKPHOS 85 08/31/2022   AST 22 08/31/2022   ALT 21 08/31/2022   ANIONGAP 12 05/24/2024   Last lipids Lab Results  Component Value  Date   CHOL 200 03/13/2018   HDL 20 (L) 03/13/2018   LDLCALC 131 (H) 03/13/2018   TRIG 247 (H) 03/13/2018   CHOLHDL 10.0 03/13/2018   Last hemoglobin A1c Lab Results  Component Value Date   HGBA1C 5.4 08/10/2024   Last thyroid  functions Lab Results  Component Value Date   TSH 4.589 (H) 10/14/2020   Last vitamin D No results found for: 25OHVITD2, 25OHVITD3, VD25OH Last vitamin B12 and Folate Lab Results  Component Value Date   VITAMINB12 1,327 (H) 08/10/2024   FOLATE >20.0 08/10/2024        Objective    BP 108/61 (BP Location: Left Arm, Patient Position: Sitting, Cuff Size: Normal)   Pulse 71   Temp 98.1 F (36.7 C) (Oral)   Ht 5' 4 (1.626 m)   Wt 130 lb (59 kg)   SpO2 99%   BMI 22.31 kg/m   BP Readings from Last 3 Encounters:  08/10/24 108/61  07/01/24 137/67  06/17/24 102/64   Wt Readings from Last 3 Encounters:  08/10/24 130 lb (59 kg)  07/01/24 130 lb (59 kg)  06/17/24 130 lb (59 kg)        Physical Exam  Physical Exam GENERAL: Alert, in no acute distress. EXTREMITIES: bilateral edema  Foot exam:Diabetic foot exam normal. Bilateral great toes and left fifth toe discolored. Subungual debris present. Toenails cold, feet with dry skin and hyperpigmentation.    Results for orders placed or performed in visit on 08/10/24  HgB A1c  Result Value Ref Range   Hgb A1c MFr Bld 5.4 4.8 - 5.6 %   Est. average glucose Bld gHb Est-mCnc 108 mg/dL  Folate  Result Value Ref Range   Folate >20.0 >3.0 ng/mL  Vitamin B12  Result Value Ref Range   Vitamin B-12 1,327 (H) 232 - 1,245 pg/mL  BMP8+EGFR  Result Value Ref Range   Glucose 85 70 - 99 mg/dL   BUN 43 (H) 8 - 27 mg/dL   Creatinine, Ser 7.98 (H) 0.76 -  1.27 mg/dL   eGFR 34 (L) >40 fO/fpw/8.26   BUN/Creatinine Ratio 21 10 - 24   Sodium 141 134 - 144 mmol/L   Potassium 5.3 (H) 3.5 - 5.2 mmol/L   Chloride 100 96 - 106 mmol/L   CO2 21 20 - 29 mmol/L   Calcium  11.2 (H) 8.6 - 10.2 mg/dL     Assessment & Plan     Problem List Items Addressed This Visit     Anxiety and depression   Ataxia   Relevant Orders   Folate (Completed)   Vitamin B12 (Completed)   Chronic low back pain (Location of Primary Source of Pain) (Central) (Chronic)    Chronic pain syndrome (lumbar facet syndrome, chronic hip pain, foot pain) Chronic pain syndrome with lumbar facet syndrome, chronic hip pain, and foot pain. Pain management includes Lyrica , Cymbalta , and tizanidine . Surgery for lumbar facet syndrome delayed due to vascular clearance requirements. Physical therapy was previously discontinued but may be beneficial pre-surgery. - Increased Lyrica  to 150 mg twice daily. - Continue tizanidine  4 mg three times daily. - Referred to physical therapy for pre-surgical preparation      Relevant Medications   divalproex (DEPAKOTE) 250 MG DR tablet   pregabalin  (LYRICA ) 150 MG capsule   Other Relevant Orders   Ambulatory referral to Home Health   Clinical depression   Chronic   - Continue Celexa  20 mg daily       DDD (degenerative disc disease), lumbar   Relevant Orders   Ambulatory referral to Home Health   Diabetes mellitus type 2, diet-controlled (HCC) (Chronic)   Chronic condition  Continue lifestyle management  Repeat A1C today DM foot exam completed, abnormal, podiatry referral submitted        Essential hypertension   Chronic hypertension  managed with amlodipine .  good control. - Continue amlodipine  2.5 mg daily      History of CVA (cerebrovascular accident)   Relevant Orders   Ambulatory referral to Home Health   Insomnia, psychophysiological    Insomnia Chronic insomnia managed with Seroquel . - Continue Seroquel  200 mg at bedtime.      PAD (peripheral artery disease)   Peripheral arterial disease with bilateral lower extremity stents Chronic  Abnormal foot exam present in bilateral great toes and left fifth toe, with discoloration and debris subungual. Cold feet and  dry skin with hyperpigmentation likely secondary to peripheral arterial disease. Stents placed in both legs, currently on Plavix  and aspirin  for four months post-procedure. Surgery delayed until vascular clearance due to risk of clotting. - Continue Plavix  75mg  every day and aspirin  for four months post-procedure. - Referred to podiatry for evaluation of skin changes and toenail care. - Ordered ABI to assess vascular status. - Continue follow-up with vascular surgery.      Relevant Medications   pregabalin  (LYRICA ) 150 MG capsule   Seizure-like activity (HCC)   Relevant Orders   Ambulatory referral to Home Health   Tobacco dependence   Chronic condition  Nicotine  dependence with continued smoking. Lung cancer screening needed. - Ordered lung cancer screening. -counseled on importance of smoking for 3 mins      Type 2 diabetes mellitus with diabetic neuropathy, without long-term current use of insulin (HCC) - Primary (Chronic)    Type 2 diabetes mellitus with diabetic polyneuropathy Chronic condition  Type 2 diabetes mellitus with diabetic polyneuropathy. Neuropathy causing significant foot pain. Current management includes Lyrica  and Cymbalta . Gabapentin  was discontinued. Neuropathy symptoms may be exacerbated by vitamin deficiencies. - Increased Lyrica   to 150 mg twice daily. - Checked B12 and folate levels to assess for deficiencies. - Continue Cymbalta  60 mg daily. - Continue metformin  500 mg twice daily. - Continue Jardiance  25 mg daily.      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 - Continue Flomax  0.4 mg at bedtime.  Chronic kidney disease, unspecified Chronic kidney disease, unspecified.  History of seizure-like activity History of seizure-like activity. Recent mild seizure-like episode with fall and head impact. Concerns about tramadol  lowering seizure threshold. - Avoid tramadol  due to risk of lowering seizure threshold.  Vitamin D deficiency Chronic condition  - Continue vitamin D supplementation.   Edema of lower extremities Edema of lower extremities, possibly related to heart failure or peripheral arterial disease. Currently on furosemide  for swelling. - Ordered BMP to assess kidney function and electrolytes. - Continue furosemide  as prescribed.  General Health Maintenance - Administered flu vaccine.     Return in about 3 months (around 11/10/2024).         Rockie Agent, MD  [facility] [phone] (phone) [phone] (fax)  [facility] "

## 2024-08-10 NOTE — Patient Instructions (Signed)
 To keep you healthy, please keep in mind the following health maintenance items that you are due for:   Health Maintenance Due  Topic Date Due   FOOT EXAM  Never done   OPHTHALMOLOGY EXAM  Never done   Zoster Vaccines- Shingrix (1 of 2) Never done   Medicare Annual Wellness (AWV)  01/22/2022   Lung Cancer Screening  09/18/2023   Influenza Vaccine  05/21/2024   HEMOGLOBIN A1C  07/14/2024     Best Wishes,   Dr. Lang

## 2024-08-11 LAB — BMP8+EGFR
BUN/Creatinine Ratio: 21 (ref 10–24)
BUN: 43 mg/dL — ABNORMAL HIGH (ref 8–27)
CO2: 21 mmol/L (ref 20–29)
Calcium: 11.2 mg/dL — ABNORMAL HIGH (ref 8.6–10.2)
Chloride: 100 mmol/L (ref 96–106)
Creatinine, Ser: 2.01 mg/dL — ABNORMAL HIGH (ref 0.76–1.27)
Glucose: 85 mg/dL (ref 70–99)
Potassium: 5.3 mmol/L — ABNORMAL HIGH (ref 3.5–5.2)
Sodium: 141 mmol/L (ref 134–144)
eGFR: 34 mL/min/1.73 — ABNORMAL LOW (ref >59)

## 2024-08-11 LAB — HEMOGLOBIN A1C
Est. average glucose Bld gHb Est-mCnc: 108 mg/dL
Hgb A1c MFr Bld: 5.4 % (ref 4.8–5.6)

## 2024-08-11 LAB — FOLATE: Folate: >20 ng/mL (ref >3.0)

## 2024-08-11 LAB — VITAMIN B12: Vitamin B-12: 1327 pg/mL — ABNORMAL HIGH (ref 232–1245)

## 2024-08-13 ENCOUNTER — Encounter: Payer: Self-pay | Admitting: Family Medicine

## 2024-08-13 ENCOUNTER — Ambulatory Visit: Payer: Self-pay | Admitting: Family Medicine

## 2024-08-13 NOTE — Assessment & Plan Note (Signed)
 Chronic   - Continue Celexa  20 mg daily

## 2024-08-13 NOTE — Assessment & Plan Note (Signed)
 Peripheral arterial disease with bilateral lower extremity stents Chronic  Abnormal foot exam present in bilateral great toes and left fifth toe, with discoloration and debris subungual. Cold feet and dry skin with hyperpigmentation likely secondary to peripheral arterial disease. Stents placed in both legs, currently on Plavix  and aspirin  for four months post-procedure. Surgery delayed until vascular clearance due to risk of clotting. - Continue Plavix  75mg  every day and aspirin  for four months post-procedure. - Referred to podiatry for evaluation of skin changes and toenail care. - Ordered ABI to assess vascular status. - Continue follow-up with vascular surgery.

## 2024-08-13 NOTE — Assessment & Plan Note (Signed)
  Type 2 diabetes mellitus with diabetic polyneuropathy Chronic condition  Type 2 diabetes mellitus with diabetic polyneuropathy. Neuropathy causing significant foot pain. Current management includes Lyrica  and Cymbalta . Gabapentin  was discontinued. Neuropathy symptoms may be exacerbated by vitamin deficiencies. - Increased Lyrica  to 150 mg twice daily. - Checked B12 and folate levels to assess for deficiencies. - Continue Cymbalta  60 mg daily. - Continue metformin  500 mg twice daily. - Continue Jardiance  25 mg daily.

## 2024-08-13 NOTE — Assessment & Plan Note (Signed)
  Insomnia Chronic insomnia managed with Seroquel . - Continue Seroquel  200 mg at bedtime.

## 2024-08-13 NOTE — Assessment & Plan Note (Signed)
  Chronic pain syndrome (lumbar facet syndrome, chronic hip pain, foot pain) Chronic pain syndrome with lumbar facet syndrome, chronic hip pain, and foot pain. Pain management includes Lyrica , Cymbalta , and tizanidine . Surgery for lumbar facet syndrome delayed due to vascular clearance requirements. Physical therapy was previously discontinued but may be beneficial pre-surgery. - Increased Lyrica  to 150 mg twice daily. - Continue tizanidine  4 mg three times daily. - Referred to physical therapy for pre-surgical preparation

## 2024-08-13 NOTE — Assessment & Plan Note (Signed)
 Chronic hypertension  managed with amlodipine .  good control. - Continue amlodipine  2.5 mg daily

## 2024-08-13 NOTE — Assessment & Plan Note (Signed)
 Chronic condition  Nicotine  dependence with continued smoking. Lung cancer screening needed. - Ordered lung cancer screening. -counseled on importance of smoking for 3 mins

## 2024-08-13 NOTE — Assessment & Plan Note (Signed)
 Chronic condition  Continue lifestyle management  Repeat A1C today DM foot exam completed, abnormal, podiatry referral submitted

## 2024-08-17 NOTE — Progress Notes (Signed)
 Last read by [name] Hisle at 9:12AM on 08/14/2024. Attempted to call mailbox is full unable to lvm. But pt has reviewed results via MyChart.

## 2024-09-10 ENCOUNTER — Encounter: Payer: Self-pay | Admitting: Emergency Medicine

## 2024-09-10 ENCOUNTER — Ambulatory Visit
Admission: EM | Admit: 2024-09-10 | Discharge: 2024-09-10 | Disposition: A | Discharge status: Home / Self Care | Payer: Medicare (Managed Care) | Attending: Emergency Medicine | Admitting: Emergency Medicine

## 2024-09-10 ENCOUNTER — Ambulatory Visit: Payer: Self-pay

## 2024-09-10 DIAGNOSIS — M545 Low back pain, unspecified: Secondary | ICD-10-CM | POA: Diagnosis not present

## 2024-09-10 MED ORDER — METHYLPREDNISOLONE SODIUM SUCC 125 MG IJ SOLR
60.0000 mg | Freq: Once | INTRAMUSCULAR | Status: AC
  Administered 2024-09-10: 60 mg via INTRAMUSCULAR

## 2024-09-10 MED ORDER — TRAMADOL HCL 50 MG PO TABS: 50.0000 mg | ORAL_TABLET | Freq: Four times a day (QID) | ORAL | 0 refills | Status: AC | PRN

## 2024-09-10 NOTE — Telephone Encounter (Signed)
 FYI Only or Action Required?: Action required by provider: clinical question for provider and requesting pain medication-Tylenol  isn't touching his pain currently.  Patient was last seen in primary care on 08/10/2024 by Sharma Coyer, MD.  Called Nurse Triage reporting Back Pain.  Symptoms began within the last week.  Interventions attempted: OTC medications: Tylenol  and Rest, hydration, or home remedies.  Symptoms are: unchanged.  Triage Disposition: See HCP Within 4 Hours (Or PCP Triage)  Patient/caregiver understands and will follow disposition?:   Copied from CRM #[id number]. Topic: Clinical - Red Word Triage >> Sep 10, 2024  9:29 AM Ivette P wrote: [facility] that prompted transfer to Nurse Triage: Lower back pain Reason for Disposition  [1] SEVERE back pain (e.g., excruciating, unable to do any normal activities) AND [2] not improved 2 hours after pain medicine  Answer Assessment - Initial Assessment Questions Patient with hx of chronic back pain that has worsened over the last week. Patient states pain is in his lower back. Patient is a 10 out of 10.  No appointments available today. Patient is asking for pain medication to be sent to his pharmacy. Would like a call back from office.   1. ONSET: When did the pain begin? (e.g., minutes, hours, days)     Chronic back pain-worsened over the last week 2. LOCATION: Where does it hurt? (upper, mid or lower back)     Lower back 3. SEVERITY: How bad is the pain?  (e.g., Scale 1-10; mild, moderate, or severe)     10 4. PATTERN: Is the pain constant? (e.g., yes, no; constant, intermittent)      constant 5. RADIATION: Does the pain shoot into your legs or somewhere else?     yes 6. CAUSE:  What do you think is causing the back pain?      unsure 7. BACK OVERUSE:  Any recent lifting of heavy objects, strenuous work or exercise?     no 8. MEDICINES: What have you taken so far for the pain? (e.g., nothing,  acetaminophen , NSAIDS)     tylenol  9. NEUROLOGIC SYMPTOMS: Do you have any weakness, numbness, or problems with bowel/bladder control?     no 10. OTHER SYMPTOMS: Do you have any other symptoms? (e.g., fever, abdomen pain, burning with urination, blood in urine)       no  Protocols used: Back Pain-A-AH

## 2024-09-10 NOTE — Telephone Encounter (Signed)
 Agree with patient's evaluation in UC today, prescribed tramadol . Continue follow up as scheduled for vascular and neurosurgery

## 2024-09-10 NOTE — Discharge Instructions (Addendum)
 Your pain is most likely caused by irritation to your chronic back pain  You have been given an injection of methylprednisolone  which is a steroid to help relieve inflammation and pain and ideally will start to see some improvement within 30 minutes to an hour, we have avoided oral steroids due to your history of diabetes  Due to you taking Plavix  which is a blood thinner we cannot use anti-inflammatory medicine such as ibuprofen  or similar products  You are currently taking Cymbalta  and Lyrica    You may use tramadol  every 6 hours as needed but please be mindful you will only be dispensed 12 tablets  You may continue use of Tylenol  or use any topical medicines additionally  You may use heating pad in 15 minute intervals as needed for additional comfort,  Begin stretching affected area daily for 10 minutes as tolerated to further loosen muscles, exercises in packet  When lying down place pillow underneath and between knees for support  Please schedule follow-up appoint with your primary doctor

## 2024-09-10 NOTE — ED Provider Notes (Signed)
 [name]    CSN: [id number] Arrival date & time: 09/10/24  1326      History   Chief Complaint Chief Complaint  Patient presents with   Back Pain    HPI [name] is a 78 y.o. male.   Patient presents for evaluation of low back pain beginning at the center radiating into the tailbone and the right hip present for 7 days.  Has chronic lumbar pain at baseline, has worsening without precipitating event, injury or trauma.  Endorses symptoms are exacerbated by standing walking twisting turning and bending.  Has caused him to become tearful and making it difficult to walk, currently in wheelchair.  Has attempted use of Tylenol  which has been ineffective.  Takes daily Cymbalta  and Lyrica  which she endorses is for his foot neuropathy and not his back.  Has upcoming appointment in January for evaluation for spinal surgery.  Denies urinary symptoms.     Past Medical History:  Diagnosis Date   Acute right PCA stroke (HCC) 03/12/2018   a.) residual loss of peripheral vision and balance issues   Anemia    Anxiety    Atherosclerosis of aorta    Benign prostatic hyperplasia    Bilateral carotid artery stenosis    Cerebral atherosclerosis 03/13/2018   a.) CTA neck 03/13/2018: proximal inferior division RIGHT M2 segment stenosis with normal distal opacification; mod stenosis of the distal RIGHT P2 segment (likely culprit of 03/12/2018 CVA); diffuse small vessel Dz   Cervical spondylosis    Chronic back pain    a.) followed [facility] pain mgmt at [facility]   DDD (degenerative disc disease), lumbar    Depression    Essential hypertension    GERD (gastroesophageal reflux disease)    HCV (hepatitis C virus)    a.) s/p Tx with ledipasvir/sofosbuvir   History of kidney stones    Leg ulcer, left (HCC) 2023   has been seen at the wound clinic   Lesion of BILATERAL parotid glands 03/13/2018   a.) CTA neck 03/13/2018: RIGHT (1.3 x 1.8 x 2.4 cm) and LEFT (1.5 x 0.9 1.2 cm); b.) CT soft  tissue neck 04/05/2021: RIGHT (2.3 x 1.6 cm) and LEFT (2.3 x 1.6 cm)   Long term current use of antithrombotics/antiplatelets    a.) daily DAPT therapy (ASA + clopidogrel )   Lumbar radiculitis    Lumbar spondylosis    Mixed hyperlipidemia    Overdose of benzodiazepine    Overuse of medication 02/07/2022   a.) admitted to [facility] with decreased LOC secondary to overuse of Delta 8 THC gummies   Peripheral vascular disease    Polysubstance abuse (HCC)    a.) marijuana + cocaine + heroin + BZO   Right-sided Bell's palsy    Type 2 diabetes mellitus without complication, without long-term current use of insulin (HCC)    Unresponsive episode 02/07/2022   Unsteady gait    a.) following CVA in 2019; uses ambulation aid (walker) for balance   Urothelial carcinoma (HCC)    Wears dentures    Has full upper.  Does not wear.    Patient Active Problem List   Diagnosis Date Noted   Atherosclerosis of native arteries of extremity with rest pain (HCC) 05/23/2024   Renal insufficiency 05/11/2024   History of ischemic cerebrovascular accident (CVA) with residual deficit 12/31/2023   Tobacco dependence 12/31/2023   Type 2 diabetes mellitus with diabetic neuropathy, without long-term current use of insulin (HCC) 12/31/2023   Leg wound, left 02/10/2022  Overweight (BMI 25.0-29.9) 02/08/2022   Diabetes mellitus type 2, diet-controlled (HCC) 02/08/2022   Tetrahydrocannabinol (THC) use disorder, moderate, dependence (HCC) 02/07/2022   Hypokalemia 02/07/2022   Polysubstance abuse (HCC) 02/07/2022   Parotid abscess 04/05/2021   Overdose of benzodiazepine 10/16/2020   Bipolar depression (HCC) 10/16/2020   Acute cystitis with hematuria    Weakness    Benign prostatic hyperplasia without lower urinary tract symptoms 12/31/2019   Ataxia 03/09/2019   Loss of peripheral visual field, bilateral 03/09/2019   Seizure-like activity (HCC) 03/09/2019   Hyperlipidemia 11/25/2018   Bilateral carotid artery  stenosis 11/25/2018   Failed back syndrome, lumbar 07/23/2018   Nephrolithiasis 07/08/2018   History of CVA (cerebrovascular accident) 05/22/2018   Visual disturbance 05/22/2018   Late effect of cerebrovascular accident (CVA) 03/12/2018   PAD (peripheral artery disease) 04/20/2017   Chronic hepatitis C without hepatic coma (HCC) 03/12/2017   Pain and swelling of lower extremity 01/14/2016   Chronic hip pain (Right) 01/14/2016   Osteoarthritis of hip (Right) 01/14/2016   Generalized anxiety disorder 01/14/2016   Anxiety and depression 01/14/2016   Lumbar facet syndrome 01/14/2016   Clinical depression 01/11/2016   Essential hypertension 01/11/2016   Insomnia, psychophysiological 01/11/2016   Current tobacco use 01/11/2016   Chronic low back pain (Location of Primary Source of Pain) (Central) 01/11/2016   Chronic pain 01/11/2016   Lumbar spondylosis 01/11/2016   Long term current use of opiate analgesic 01/11/2016   Long term prescription opiate use 01/11/2016   Opiate use (15 MME/Day) 01/11/2016   Encounter for therapeutic drug level monitoring 01/11/2016   Encounter for pain management planning 01/11/2016   Failed back surgical syndrome (S/P anterior fusion at L5-S1) 01/11/2016   Lumbar facet arthropathy 01/11/2016   Lumbosacral radiculitis 08/16/2014   DDD (degenerative disc disease), lumbar 08/16/2014   Discogenic low back pain 04/27/2012    Past Surgical History:  Procedure Laterality Date   ANTERIOR LUMBAR FUSION  05/13/2012   Procedure: ANTERIOR LUMBAR FUSION 1 LEVEL;  Surgeon: Donaciano Sprang, MD;  Location: [facility] OR;  Service: Orthopedics;  Laterality: N/A;  ALIF L5-S1   CATARACT EXTRACTION W/PHACO Left 01/01/2021   Procedure: CATARACT EXTRACTION PHACO AND INTRAOCULAR LENS PLACEMENT (IOC) LEFT DIABETIC 2.28 00:24.7;  Surgeon: Myrna Adine Anes, MD;  Location: [facility];  Service: Ophthalmology;  Laterality: Left;  covid + 11-30-20   CATARACT EXTRACTION W/PHACO Right  01/15/2021   Procedure: CATARACT EXTRACTION PHACO AND INTRAOCULAR LENS PLACEMENT (IOC) RIGHT DIABETIC;  Surgeon: Myrna Adine Anes, MD;  Location: [facility];  Service: Ophthalmology;  Laterality: Right;  2.62 0:34.5   CHOLECYSTECTOMY     COLONOSCOPY WITH PROPOFOL  N/A 09/19/2021   Procedure: COLONOSCOPY WITH PROPOFOL ;  Surgeon: Toledo, Ladell POUR, MD;  Location: [facility];  Service: Gastroenterology;  Laterality: N/A;   CYSTOSCOPY W/ RETROGRADES Left 08/04/2018   Procedure: CYSTOSCOPY WITH RETROGRADE PYELOGRAM;  Surgeon: Twylla Glendia BROCKS, MD;  Location: [facility];  Service: Urology;  Laterality: Left;   CYSTOSCOPY W/ RETROGRADES Left 01/20/2024   Procedure: CYSTOSCOPY, WITH RETROGRADE PYELOGRAM;  Surgeon: Twylla Glendia BROCKS, MD;  Location: [facility];  Service: Urology;  Laterality: Left;   CYSTOSCOPY W/ URETERAL STENT PLACEMENT Right 07/30/2022   Procedure: CYSTOSCOPY WITH URETERAL STENT REPLACEMENT;  Surgeon: Twylla Glendia BROCKS, MD;  Location: [facility];  Service: Urology;  Laterality: Right;   CYSTOSCOPY WITH BIOPSY N/A 01/20/2024   Procedure: CYSTOSCOPY and Urethral  WITH BIOPSY;  Surgeon: Twylla Glendia BROCKS, MD;  Location: [facility];  Service: Urology;  Laterality: N/A;   CYSTOSCOPY WITH INSERTION OF UROLIFT N/A 01/18/2020   Procedure: CYSTOSCOPY WITH INSERTION OF UROLIFT;  Surgeon: Twylla Glendia BROCKS, MD;  Location: [facility];  Service: Urology;  Laterality: N/A;   CYSTOSCOPY WITH RETROGRADE PYELOGRAM, URETEROSCOPY AND STENT PLACEMENT Left 06/18/2022   Procedure: CYSTOSCOPY WITH RETROGRADE PYELOGRAM;  Surgeon: Twylla Glendia BROCKS, MD;  Location: [facility];  Service: Urology;  Laterality: Left;   CYSTOSCOPY WITH STENT PLACEMENT Left 08/04/2018   Procedure: CYSTOSCOPY WITH STENT PLACEMENT;  Surgeon: Twylla Glendia BROCKS, MD;  Location: [facility];  Service: Urology;  Laterality: Left;   CYSTOSCOPY WITH URETEROSCOPY AND STENT PLACEMENT Right 06/18/2022   Procedure: CYSTOSCOPY WITH URETEROSCOPY AND STENT  PLACEMENT;  Surgeon: Twylla Glendia BROCKS, MD;  Location: [facility];  Service: Urology;  Laterality: Right;   EXTRACORPOREAL SHOCK WAVE LITHOTRIPSY  2012   EXTRACORPOREAL SHOCK WAVE LITHOTRIPSY Left 08/20/2018   Procedure: EXTRACORPOREAL SHOCK WAVE LITHOTRIPSY (ESWL);  Surgeon: Penne Knee, MD;  Location: [facility];  Service: Urology;  Laterality: Left; (cancelled)   EXTRACORPOREAL SHOCK WAVE LITHOTRIPSY Left 08/27/2018   Procedure: EXTRACORPOREAL SHOCK WAVE LITHOTRIPSY (ESWL);  Surgeon: Twylla Glendia BROCKS, MD;  Location: [facility];  Service: Urology;  Laterality: Left;   EXTRACORPOREAL SHOCK WAVE LITHOTRIPSY Right 10/15/2018   Procedure: EXTRACORPOREAL SHOCK WAVE LITHOTRIPSY (ESWL);  Surgeon: Francisca Redell BROCKS, MD;  Location: [facility];  Service: Urology;  Laterality: Right;   KIDNEY STONE SURGERY     LOWER EXTREMITY ANGIOGRAPHY Left 05/04/2024   Procedure: Lower Extremity Angiography;  Surgeon: Jama Cordella [name], MD;  Location: [facility];  Service: Cardiovascular;  Laterality: Left;   LOWER EXTREMITY ANGIOGRAPHY Right 06/15/2024   Procedure: Lower Extremity Angiography;  Surgeon: Jama Cordella [name], MD;  Location: [facility];  Service: Cardiovascular;  Laterality: Right;   NEPHRECTOMY, RADICAL, ROBOT ASSISTED, LAPAROSCOPIC Right 11/21/2022   with ureterectomy   TRANSURETHRAL RESECTION OF BLADDER TUMOR N/A 01/20/2024   Procedure: TURBT (TRANSURETHRAL RESECTION OF BLADDER TUMOR);  Surgeon: Twylla Glendia BROCKS, MD;  Location: [facility];  Service: Urology;  Laterality: N/A;   URETERAL BIOPSY Right 07/30/2022   Procedure: URETERAL BIOPSY OF RENAL PELVIS;  Surgeon: Twylla Glendia BROCKS, MD;  Location: [facility];  Service: Urology;  Laterality: Right;   URETEROSCOPY Right 07/30/2022   Procedure: URETEROSCOPY;  Surgeon: Twylla Glendia BROCKS, MD;  Location: [facility];  Service: Urology;  Laterality: Right;       Home Medications    Prior to Admission medications   Medication Sig Start Date End  Date Taking? Authorizing Provider  acetaminophen  (TYLENOL ) 500 MG tablet Oral 04/25/23   [provider]  amLODipine  (NORVASC ) 2.5 MG tablet Take 1 tablet (2.5 mg total) by mouth every morning. 05/28/24   Wellington Curtis LABOR, FNP  aspirin  EC 81 MG tablet Take 1 tablet (81 mg total) by mouth daily. Swallow whole. 05/04/24   Pace, Gwendlyn SAUNDERS, NP  atorvastatin  (LIPITOR ) 10 MG tablet Take 1 tablet (10 mg total) by mouth daily. 05/04/24 05/04/25  Elisabeth Gwendlyn SAUNDERS, NP  Blood Glucose Monitoring Suppl ([employer] BLOOD GLUCOSE MONITO) w/Device KIT MISCELLANEOUS 03/24/23   [provider]  Cholecalciferol (VITAMIN D-1000 MAX ST) 25 MCG (1000 UT) tablet Take 1,000 Units by mouth daily.    [provider]  citalopram  (CELEXA ) 40 MG tablet Take 40 mg by mouth daily.    [provider]  clopidogrel  (PLAVIX ) 75 MG tablet Take 1 tablet (75 mg total) by mouth daily. 07/09/24  Simmons-Robinson, Makiera, MD  divalproex (DEPAKOTE) 250 MG DR tablet Take 250 mg by mouth 2 (two) times daily.    [provider]  Docusate Sodium  (DSS) 100 MG CAPS Oral 01/24/23   [provider]  DULoxetine  (CYMBALTA ) 60 MG capsule Take 1 capsule (60 mg total) by mouth daily. 03/11/24   Simmons-Robinson, Rockie, MD  folic acid  (FOLVITE ) 1 MG tablet Take 1 tablet (1 mg total) by mouth daily. 03/11/24   Simmons-Robinson, Makiera, MD  furosemide  (LASIX ) 20 MG tablet TAKE 1 TABLET BY MOUTH EVERY DAY 08/09/24   Simmons-Robinson, Rockie, MD  Incontinence Supplies (MALE URINAL) MISC 1 Device by Does not apply route as needed. 06/10/24   Simmons-Robinson, Rockie, MD  JARDIANCE  25 MG TABS tablet Take 1 tablet (25 mg total) by mouth daily. 05/28/24   Clifton, Kellie A, FNP  metFORMIN  (GLUCOPHAGE ) 500 MG tablet Take 1 tablet (500 mg total) by mouth 2 (two) times daily with a meal. 03/11/24   Simmons-Robinson, Makiera, MD  Multiple Vitamins-Minerals (CENTRUM SILVER 50+MEN) TABS Take 1 tablet by mouth daily.    [provider]  Nutritional Supplements (ENSURE HIGH PROTEIN) LIQD Take 1 Bottle by mouth 3 (three) times daily. 06/10/24   Simmons-Robinson, Rockie, MD  pregabalin  (LYRICA ) 150 MG capsule Take 1 capsule (150 mg total) by mouth 2 (two) times daily. 08/10/24   Simmons-Robinson, Makiera, MD  QUEtiapine  (SEROQUEL ) 200 MG tablet Take 200 mg by mouth at bedtime.    [provider]  tamsulosin  (FLOMAX ) 0.4 MG CAPS capsule Take 1 capsule (0.4 mg total) by mouth at bedtime. 05/24/24   Simmons-Robinson, Makiera, MD  tiZANidine  (ZANAFLEX ) 4 MG capsule TAKE 1 CAPSULE BY MOUTH 3 TIMES DAILY. 04/27/24   Simmons-Robinson, Rockie, MD    Family History Family History  Problem Relation Age of Onset   Stroke Mother    Diabetes Mother    Lung cancer Father    Diabetes Father     Social History Social History   Tobacco Use   Smoking status: Every Day    Current packs/day: 0.50    Average packs/day: 0.5 packs/day for 53.0 years (26.5 ttl pk-yrs)    Types: Cigarettes   Smokeless tobacco: Never  Vaping Use   Vaping status: Never Used  Substance Use Topics   Alcohol  use: Not Currently   Drug use: Not Currently    Types: Heroin, Cocaine, Marijuana    Comment: 1960's     Allergies   Lisinopril   Review of Systems Review of Systems   Physical Exam Triage Vital Signs ED Triage Vitals  Encounter Vitals Group     BP 09/10/24 1443 126/78     Girls Systolic BP Percentile --      Girls Diastolic BP Percentile --      Boys Systolic BP Percentile --      Boys Diastolic BP Percentile --      Pulse Rate 09/10/24 1443 85     Resp 09/10/24 1443 19     Temp 09/10/24 1443 98 F (36.7 C)     Temp Source 09/10/24 1443 Oral     SpO2 09/10/24 1443 96 %     Weight --      Height --      Head Circumference --      Peak Flow --      Pain Score 09/10/24 1447 10     Pain Loc --      Pain Education --      Exclude  from Growth Chart --    No data found.  Updated Vital Signs BP 126/78 (BP  Location: Right Arm)   Pulse 85   Temp 98 F (36.7 C) (Oral)   Resp 19   SpO2 96%   Visual Acuity Right Eye Distance:   Left Eye Distance:   Bilateral Distance:    Right Eye Near:   Left Eye Near:    Bilateral Near:     Physical Exam Constitutional:      Appearance: Normal appearance.  Eyes:     Extraocular Movements: Extraocular movements intact.  Pulmonary:     Effort: Pulmonary effort is normal.  Musculoskeletal:     Comments: Tenderness across the lumbar region without point tenderness, ecchymosis swelling or deformity, difficulty sitting or read, leaning towards the left side, positive straight leg test bilaterally  Neurological:     Mental Status: He is alert and oriented to person, place, and time. Mental status is at baseline.      UC Treatments / Results  Labs (all labs ordered are listed, but only abnormal results are displayed) Labs Reviewed - No data to display  EKG   Radiology No results found.  Procedures Procedures (including critical care time)  Medications Ordered in UC Medications - No data to display  Initial Impression / Assessment and Plan / UC Course  I have reviewed the triage vital signs and the nursing notes.  Pertinent labs & imaging results that were available during my care of the patient were reviewed by me and considered in my medical decision making (see chart for details).  Lumbar back pain  Acute on chronic flare, limitations on medicine due to history, well-controlled diabetes, methylprednisolone  IM given, will defer use of oral steroids, currently taking Lyrica  and Cymbalta  and has taken gabapentin  in the past without success per chart review, currently taking Plavix  therefore defer use of NSAIDs, prescribed tramadol , which his family is hesitant to use, 12 tablets to be dispensed, PDMP reviewed, moderate risk, recommended nonpharmacological supportive care and advised follow-up with his primary for further management until  able to be seen by orthopedic Final Clinical Impressions(s) / UC Diagnoses   Final diagnoses:  None   Discharge Instructions   None    ED Prescriptions   None    I have reviewed the PDMP during this encounter.   Teresa Shelba SAUNDERS, NP 09/10/24 [phone]

## 2024-09-10 NOTE — ED Triage Notes (Signed)
 Patient reports lower back pain that started 1 week ago. Patient states that the back hurts so bad that he can't walk. Patient is schedule to see his back doctor in January to schedule surgery.  Rates pain 10/10. Patient has taken Tylenol  with no relief.

## 2024-09-13 NOTE — Telephone Encounter (Signed)
 Unable to leave voicemail. Ok for E2C2 to advise per provider if call is returned

## 2024-09-20 ENCOUNTER — Other Ambulatory Visit: Payer: Self-pay | Admitting: Family Medicine

## 2024-09-20 DIAGNOSIS — M545 Low back pain, unspecified: Secondary | ICD-10-CM

## 2024-09-20 DIAGNOSIS — I739 Peripheral vascular disease, unspecified: Secondary | ICD-10-CM

## 2024-09-20 DIAGNOSIS — E1140 Type 2 diabetes mellitus with diabetic neuropathy, unspecified: Secondary | ICD-10-CM

## 2024-09-27 DIAGNOSIS — R2689 Other abnormalities of gait and mobility: Secondary | ICD-10-CM | POA: Diagnosis not present

## 2024-09-27 DIAGNOSIS — R4189 Other symptoms and signs involving cognitive functions and awareness: Secondary | ICD-10-CM | POA: Diagnosis not present

## 2024-09-27 DIAGNOSIS — R569 Unspecified convulsions: Secondary | ICD-10-CM | POA: Diagnosis not present

## 2024-09-27 DIAGNOSIS — Z8673 Personal history of transient ischemic attack (TIA), and cerebral infarction without residual deficits: Secondary | ICD-10-CM | POA: Diagnosis not present

## 2024-09-27 DIAGNOSIS — R42 Dizziness and giddiness: Secondary | ICD-10-CM | POA: Diagnosis not present

## 2024-09-27 DIAGNOSIS — G629 Polyneuropathy, unspecified: Secondary | ICD-10-CM | POA: Diagnosis not present

## 2024-09-27 DIAGNOSIS — F411 Generalized anxiety disorder: Secondary | ICD-10-CM | POA: Diagnosis not present

## 2024-09-28 ENCOUNTER — Telehealth: Payer: Self-pay | Admitting: Family Medicine

## 2024-09-28 DIAGNOSIS — E1140 Type 2 diabetes mellitus with diabetic neuropathy, unspecified: Secondary | ICD-10-CM

## 2024-09-28 DIAGNOSIS — E119 Type 2 diabetes mellitus without complications: Secondary | ICD-10-CM

## 2024-09-28 MED ORDER — JARDIANCE 25 MG PO TABS: 25.0000 mg | ORAL_TABLET | Freq: Every day | ORAL | 3 refills | Status: AC

## 2024-09-28 NOTE — Telephone Encounter (Signed)
[facility] faxed refill request for the following medications: ? ?JARDIANCE 25 MG TABS tablet  ? ?Please advise. ? ?

## 2024-09-29 NOTE — Progress Notes (Unsigned)
 "                                                                      MRN : [id number]  [name] is a 78 y.o. (29-Nov-1945) male who presents with chief complaint of check circulation.  History of Present Illness:   The patient returns to the office for followup and review status post angiogram with intervention on 06/15/2024.    Procedure:  Percutaneous transluminal angioplasty and stent placement right superficial femoral and popliteal artery. 2.   Atherectomy right SFA and popliteal with the [place] thromboatherectomy catheter.    The patient notes improvement in the lower extremity symptoms. No interval shortening of the patient's claudication distance or rest pain symptoms. No new ulcers or wounds have occurred since the last visit.  Patient is also followed for carotid stenosis.  No recent history of amaurosis fugax or recent TIA symptoms. There are no recent neurological changes noted.   There have been no significant changes to the patient's overall health care.   No documented history of DVT, PE or superficial thrombophlebitis. The patient denies recent episodes of angina or shortness of breath.    ABI's Rt=1.05 and Lt=0.82  (previous ABI's Rt=1.11 and Lt=1.02).  Duplex ultrasound of the right lower extremity dated 09/30/2024 demonstrates uniform velocities through the right lower extremity no evidence of hemodynamically significant stenosis.  Carotid duplex dated 11/24/2018 demonstrates 1 to 39% bilateral ICA stenosis.  Mild heterogeneous plaque is identified.  No outpatient medications have been marked as taking for the 09/30/24 encounter (Appointment) with Jama, Cordella [name], MD.    Past Medical History:  Diagnosis Date   Acute right PCA stroke (HCC) 03/12/2018   a.) residual loss of peripheral vision and balance issues   Anemia    Anxiety    Atherosclerosis of aorta    Benign prostatic hyperplasia    Bilateral carotid artery stenosis    Cerebral  atherosclerosis 03/13/2018   a.) CTA neck 03/13/2018: proximal inferior division RIGHT M2 segment stenosis with normal distal opacification; mod stenosis of the distal RIGHT P2 segment (likely culprit of 03/12/2018 CVA); diffuse small vessel Dz   Cervical spondylosis    Chronic back pain    a.) followed [facility] pain mgmt at [facility]   DDD (degenerative disc disease), lumbar    Depression    Essential hypertension    GERD (gastroesophageal reflux disease)    HCV (hepatitis C virus)    a.) s/p Tx with ledipasvir/sofosbuvir   History of kidney stones    Leg ulcer, left (HCC) 2023   has been seen at the wound clinic   Lesion of BILATERAL parotid glands 03/13/2018   a.) CTA neck 03/13/2018: RIGHT (1.3 x 1.8 x 2.4 cm) and LEFT (1.5 x 0.9 1.2 cm); b.) CT soft tissue neck 04/05/2021: RIGHT (2.3 x 1.6 cm) and LEFT (2.3 x 1.6 cm)   Long term current use of antithrombotics/antiplatelets    a.) daily DAPT therapy (ASA + clopidogrel )   Lumbar radiculitis    Lumbar spondylosis    Mixed hyperlipidemia    Overdose of benzodiazepine    Overuse of medication 02/07/2022   a.) admitted to [facility] with decreased LOC secondary to overuse of Delta 8 THC gummies  Peripheral vascular disease    Polysubstance abuse (HCC)    a.) marijuana + cocaine + heroin + BZO   Right-sided Bell's palsy    Type 2 diabetes mellitus without complication, without long-term current use of insulin (HCC)    Unresponsive episode 02/07/2022   Unsteady gait    a.) following CVA in 2019; uses ambulation aid (walker) for balance   Urothelial carcinoma (HCC)    Wears dentures    Has full upper.  Does not wear.    Past Surgical History:  Procedure Laterality Date   ANTERIOR LUMBAR FUSION  05/13/2012   Procedure: ANTERIOR LUMBAR FUSION 1 LEVEL;  Surgeon: Donaciano Sprang, MD;  Location: [facility] OR;  Service: Orthopedics;  Laterality: N/A;  ALIF L5-S1   CATARACT EXTRACTION W/PHACO Left 01/01/2021   Procedure: CATARACT EXTRACTION PHACO AND  INTRAOCULAR LENS PLACEMENT (IOC) LEFT DIABETIC 2.28 00:24.7;  Surgeon: Myrna Adine Anes, MD;  Location: [facility];  Service: Ophthalmology;  Laterality: Left;  covid + 11-30-20   CATARACT EXTRACTION W/PHACO Right 01/15/2021   Procedure: CATARACT EXTRACTION PHACO AND INTRAOCULAR LENS PLACEMENT (IOC) RIGHT DIABETIC;  Surgeon: Myrna Adine Anes, MD;  Location: [facility];  Service: Ophthalmology;  Laterality: Right;  2.62 0:34.5   CHOLECYSTECTOMY     COLONOSCOPY WITH PROPOFOL  N/A 09/19/2021   Procedure: COLONOSCOPY WITH PROPOFOL ;  Surgeon: Toledo, Ladell POUR, MD;  Location: [facility];  Service: Gastroenterology;  Laterality: N/A;   CYSTOSCOPY W/ RETROGRADES Left 08/04/2018   Procedure: CYSTOSCOPY WITH RETROGRADE PYELOGRAM;  Surgeon: Twylla Glendia BROCKS, MD;  Location: [facility] ORS;  Service: Urology;  Laterality: Left;   CYSTOSCOPY W/ RETROGRADES Left 01/20/2024   Procedure: CYSTOSCOPY, WITH RETROGRADE PYELOGRAM;  Surgeon: Twylla Glendia BROCKS, MD;  Location: [facility] OR[facility];  Service: Urology;  Laterality: Left;   CYSTOSCOPY W/ URETERAL STENT PLACEMENT Right 07/30/2022   Procedure: CYSTOSCOPY WITH URETERAL STENT REPLACEMENT;  Surgeon: Twylla Glendia BROCKS, MD;  Location: [facility] OR[facility];  Service: Urology;  Laterality: Right;   CYSTOSCOPY WITH BIOPSY N/A 01/20/2024   Procedure: CYSTOSCOPY and Urethral  WITH BIOPSY;  Surgeon: Twylla Glendia BROCKS, MD;  Location: [facility];  Service: Urology;  Laterality: N/A;   CYSTOSCOPY WITH INSERTION OF UROLIFT N/A 01/18/2020   Procedure: CYSTOSCOPY WITH INSERTION OF UROLIFT;  Surgeon: Twylla Glendia BROCKS, MD;  Location: [facility];  Service: Urology;  Laterality: N/A;   CYSTOSCOPY WITH RETROGRADE PYELOGRAM, URETEROSCOPY AND STENT PLACEMENT Left 06/18/2022   Procedure: CYSTOSCOPY WITH RETROGRADE PYELOGRAM;  Surgeon: Twylla Glendia BROCKS, MD;  Location: [facility];  Service: Urology;  Laterality: Left;   CYSTOSCOPY WITH STENT PLACEMENT Left 08/04/2018   Procedure: CYSTOSCOPY WITH STENT  PLACEMENT;  Surgeon: Twylla Glendia BROCKS, MD;  Location: [facility];  Service: Urology;  Laterality: Left;   CYSTOSCOPY WITH URETEROSCOPY AND STENT PLACEMENT Right 06/18/2022   Procedure: CYSTOSCOPY WITH URETEROSCOPY AND STENT PLACEMENT;  Surgeon: Twylla Glendia BROCKS, MD;  Location: [facility];  Service: Urology;  Laterality: Right;   EXTRACORPOREAL SHOCK WAVE LITHOTRIPSY  2012   EXTRACORPOREAL SHOCK WAVE LITHOTRIPSY Left 08/20/2018   Procedure: EXTRACORPOREAL SHOCK WAVE LITHOTRIPSY (ESWL);  Surgeon: Penne Knee, MD;  Location: [facility];  Service: Urology;  Laterality: Left; (cancelled)   EXTRACORPOREAL SHOCK WAVE LITHOTRIPSY Left 08/27/2018   Procedure: EXTRACORPOREAL SHOCK WAVE LITHOTRIPSY (ESWL);  Surgeon: Twylla Glendia BROCKS, MD;  Location: [facility];  Service: Urology;  Laterality: Left;   EXTRACORPOREAL SHOCK WAVE LITHOTRIPSY Right 10/15/2018   Procedure: EXTRACORPOREAL SHOCK WAVE LITHOTRIPSY (ESWL);  Surgeon: Francisca Redell BROCKS, MD;  Location: [facility];  Service: Urology;  Laterality: Right;   KIDNEY STONE SURGERY     LOWER EXTREMITY ANGIOGRAPHY Left 05/04/2024   Procedure: Lower Extremity Angiography;  Surgeon: Jama Cordella [name], MD;  Location: [facility];  Service: Cardiovascular;  Laterality: Left;   LOWER EXTREMITY ANGIOGRAPHY Right 06/15/2024   Procedure: Lower Extremity Angiography;  Surgeon: Jama Cordella [name], MD;  Location: [facility];  Service: Cardiovascular;  Laterality: Right;   NEPHRECTOMY, RADICAL, ROBOT ASSISTED, LAPAROSCOPIC Right 11/21/2022   with ureterectomy   TRANSURETHRAL RESECTION OF BLADDER TUMOR N/A 01/20/2024   Procedure: TURBT (TRANSURETHRAL RESECTION OF BLADDER TUMOR);  Surgeon: Twylla Glendia BROCKS, MD;  Location: [facility];  Service: Urology;  Laterality: N/A;   URETERAL BIOPSY Right 07/30/2022   Procedure: URETERAL BIOPSY OF RENAL PELVIS;  Surgeon: Twylla Glendia BROCKS, MD;  Location: [facility];  Service: Urology;  Laterality: Right;   URETEROSCOPY Right  07/30/2022   Procedure: URETEROSCOPY;  Surgeon: Twylla Glendia BROCKS, MD;  Location: [facility];  Service: Urology;  Laterality: Right;    Social History Social History   Tobacco Use   Smoking status: Every Day    Current packs/day: 0.50    Average packs/day: 0.5 packs/day for 53.0 years (26.5 ttl pk-yrs)    Types: Cigarettes   Smokeless tobacco: Never  Vaping Use   Vaping status: Never Used  Substance Use Topics   Alcohol  use: Not Currently   Drug use: Not Currently    Types: Heroin, Cocaine, Marijuana    Comment: 1960's    Family History Family History  Problem Relation Age of Onset   Stroke Mother    Diabetes Mother    Lung cancer Father    Diabetes Father     Allergies  Allergen Reactions   Lisinopril      REVIEW OF SYSTEMS (Negative unless checked)  Constitutional: [] Weight loss  [] Fever  [] Chills Cardiac: [] Chest pain   [] Chest pressure   [] Palpitations   [] Shortness of breath when laying flat   [] Shortness of breath with exertion. Vascular:  [x] Pain in legs with walking   [] Pain in legs at rest  [] History of DVT   [] Phlebitis   [] Swelling in legs   [] Varicose veins   [] Non-healing ulcers Pulmonary:   [] Uses home oxygen   [] Productive cough   [] Hemoptysis   [] Wheeze  [] COPD   [] Asthma Neurologic:  [] Dizziness   [] Seizures   [] History of stroke   [] History of TIA  [] Aphasia   [] Vissual changes   [] Weakness or numbness in arm   [x] Weakness or numbness in leg Musculoskeletal:   [] Joint swelling   [x] Joint pain   [x] Low back pain Hematologic:  [] Easy bruising  [] Easy bleeding   [] Hypercoagulable state   [] Anemic Gastrointestinal:  [] Diarrhea   [] Vomiting  [] Gastroesophageal reflux/heartburn   [] Difficulty swallowing. Genitourinary:  [] Chronic kidney disease   [] Difficult urination  [] Frequent urination   [] Blood in urine Skin:  [] Rashes   [] Ulcers  Psychological:  [] History of anxiety   []  History of major depression.  Physical Examination  There were no vitals filed  for this visit. There is no height or weight on file to calculate BMI. Gen: WD/WN, NAD Head: [place]/AT, No temporalis wasting.  Ear/Nose/Throat: Hearing grossly intact, nares w/o erythema or drainage Eyes: PER, EOMI, sclera nonicteric.  Neck: Supple, no masses.  No bruit or JVD.  Pulmonary:  Good air movement, no audible wheezing, no use of accessory muscles.  Cardiac: RRR, normal S1, S2, no Murmurs. Vascular:  mild trophic changes, no open wounds Vessel Right Left  Radial Palpable Palpable  PT Not Palpable Not Palpable  DP Not Palpable Not Palpable  Gastrointestinal: soft, non-distended. No guarding/no peritoneal signs.  Musculoskeletal: M/S 5/5 throughout.  No visible deformity.  Neurologic: CN 2-12 intact. Pain and light touch intact in extremities.  Symmetrical.  Speech is fluent. Motor exam as listed above. Psychiatric: Judgment intact, Mood & affect appropriate for pt's clinical situation. Dermatologic: No rashes or ulcers noted.  No changes consistent with cellulitis.   CBC Lab Results  Component Value Date   WBC 11.5 (H) 01/13/2024   HGB 15.4 01/13/2024   HCT 46.1 01/13/2024   MCV 90.4 01/13/2024   PLT 254 01/13/2024    BMET    Component Value Date/Time   NA 141 08/10/2024 1038   K 5.3 (H) 08/10/2024 1038   CL 100 08/10/2024 1038   CO2 21 08/10/2024 1038   GLUCOSE 85 08/10/2024 1038   GLUCOSE 108 (H) 05/24/2024 0947   BUN 43 (H) 08/10/2024 1038   CREATININE 2.01 (H) 08/10/2024 1038   CALCIUM  11.2 (H) 08/10/2024 1038   GFRNONAA 46 (L) 06/15/2024 0756   GFRAA >60 06/03/2018 1831   CrCl cannot be calculated (Patient's most recent lab result is older than the maximum 21 days allowed.).  COAG Lab Results  Component Value Date   INR 1.08 05/07/2012    Radiology No results found.   Assessment/Plan 1. Atherosclerosis of native artery of both lower extremities with rest pain (HCC) (Primary)  Recommend:  The patient has status postintervention August 2025.  His  risk pain symptoms have now resolved.  The patient has evidence of atherosclerosis of the lower extremities with claudication.  The patient does not voice lifestyle limiting changes at this point in time.  Noninvasive studies do not suggest clinically significant change.  No invasive studies, angiography or surgery at this time The patient should continue walking and begin a more formal exercise program.  The patient should continue antiplatelet therapy and aggressive treatment of the lipid abnormalities  No changes in the patient's medications at this time  Continued surveillance is indicated as atherosclerosis is likely to progress with time.    The patient will continue follow up with noninvasive studies as ordered.  - VAS US  ABI WITH/WO TBI; Future  2. Bilateral carotid artery stenosis Recommend:  Given the patient's asymptomatic subcritical stenosis no further invasive testing or surgery at this time.  Duplex ultrasound shows 1-39% stenosis bilaterally.  Continue antiplatelet therapy as prescribed Continue management of CAD, HTN and Hyperlipidemia Healthy heart diet,  encouraged exercise at least 4 times per week  Follow up in 6 months with duplex ultrasound and physical exam  - VAS US  CAROTID; Future  3. Essential hypertension Continue antihypertensive medications as already ordered, these medications have been reviewed and there are no changes at this time.  4. Diabetes mellitus type 2, diet-controlled (HCC) Continue hypoglycemic medications as already ordered, these medications have been reviewed and there are no changes at this time.  Hgb A1C to be monitored as already arranged by primary service  5. Degeneration of intervertebral disc of lumbar region with discogenic back pain and lower extremity pain Continue medications to treat the patient's degenerative disease as already ordered, these medications have been reviewed and there are no changes at this  time.  Continued activity and therapy was stressed.  6. Mixed hyperlipidemia Continue statin as ordered and reviewed, no changes at this time    Cordella  Bibiana Gillean, MD  09/29/2024 4:33 PM   "

## 2024-09-30 ENCOUNTER — Other Ambulatory Visit (INDEPENDENT_AMBULATORY_CARE_PROVIDER_SITE_OTHER): Payer: Medicare (Managed Care)

## 2024-09-30 ENCOUNTER — Ambulatory Visit (INDEPENDENT_AMBULATORY_CARE_PROVIDER_SITE_OTHER): Payer: Medicare (Managed Care) | Admitting: Vascular Surgery

## 2024-09-30 ENCOUNTER — Encounter (INDEPENDENT_AMBULATORY_CARE_PROVIDER_SITE_OTHER): Payer: Self-pay | Admitting: Vascular Surgery

## 2024-09-30 VITALS — BP 114/73 | HR 70 | Resp 18

## 2024-09-30 DIAGNOSIS — M51362 Other intervertebral disc degeneration, lumbar region with discogenic back pain and lower extremity pain: Secondary | ICD-10-CM

## 2024-09-30 DIAGNOSIS — I70223 Atherosclerosis of native arteries of extremities with rest pain, bilateral legs: Secondary | ICD-10-CM | POA: Diagnosis not present

## 2024-09-30 DIAGNOSIS — I10 Essential (primary) hypertension: Secondary | ICD-10-CM

## 2024-09-30 DIAGNOSIS — E119 Type 2 diabetes mellitus without complications: Secondary | ICD-10-CM

## 2024-09-30 DIAGNOSIS — E782 Mixed hyperlipidemia: Secondary | ICD-10-CM

## 2024-09-30 DIAGNOSIS — I6523 Occlusion and stenosis of bilateral carotid arteries: Secondary | ICD-10-CM

## 2024-10-04 LAB — VAS US ABI WITH/WO TBI
Left ABI: 0.82
Right ABI: 1.05

## 2024-10-08 ENCOUNTER — Encounter (INDEPENDENT_AMBULATORY_CARE_PROVIDER_SITE_OTHER): Payer: Self-pay | Admitting: Vascular Surgery

## 2024-10-11 DIAGNOSIS — M47812 Spondylosis without myelopathy or radiculopathy, cervical region: Secondary | ICD-10-CM | POA: Diagnosis not present

## 2024-10-11 DIAGNOSIS — M1611 Unilateral primary osteoarthritis, right hip: Secondary | ICD-10-CM | POA: Diagnosis not present

## 2024-10-11 DIAGNOSIS — M4802 Spinal stenosis, cervical region: Secondary | ICD-10-CM | POA: Diagnosis not present

## 2024-10-19 ENCOUNTER — Telehealth: Payer: Self-pay | Admitting: Family Medicine

## 2024-10-19 DIAGNOSIS — I10 Essential (primary) hypertension: Secondary | ICD-10-CM

## 2024-10-19 MED ORDER — AMLODIPINE BESYLATE 2.5 MG PO TABS: 2.5000 mg | ORAL_TABLET | ORAL | 0 refills | Status: AC

## 2024-10-19 NOTE — Telephone Encounter (Signed)
[facility] faxed refill request for the following medications:   amLODipine (NORVASC) 2.5 MG tablet   Please advise.

## 2024-10-19 NOTE — Telephone Encounter (Signed)
 Refilled

## 2024-10-25 ENCOUNTER — Other Ambulatory Visit (HOSPITAL_COMMUNITY): Payer: Self-pay

## 2024-10-25 ENCOUNTER — Other Ambulatory Visit: Payer: Self-pay | Admitting: Family Medicine

## 2024-10-25 ENCOUNTER — Telehealth: Payer: Self-pay

## 2024-10-25 DIAGNOSIS — E440 Moderate protein-calorie malnutrition: Secondary | ICD-10-CM

## 2024-10-25 NOTE — Telephone Encounter (Signed)
 Pharmacy Patient Advocate Encounter   Received notification from Physician's Office that prior authorization for ensure is required/requested.   Insurance verification completed.   The patient is insured through [employer].   Per test claim: Per test claim, medication is not covered due to plan/benefit exclusion, PA not submitted at this time  Plan will not cover medication, it is considered OTC.

## 2024-10-25 NOTE — Telephone Encounter (Signed)
 Pharmacy Patient Advocate Encounter   Received notification from Physician's Office that prior authorization for ensure is required/requested.   Insurance verification completed.   The patient is insured through [employer].   Per test claim: Per test claim, medication is not covered due to plan/benefit exclusion, PA not submitted at this time  Plan will not cover medication, it is considered OTC. Clinic made aware.

## 2024-10-27 ENCOUNTER — Other Ambulatory Visit: Payer: Self-pay | Admitting: Family Medicine

## 2024-10-27 DIAGNOSIS — M5417 Radiculopathy, lumbosacral region: Secondary | ICD-10-CM

## 2024-11-11 ENCOUNTER — Encounter: Payer: Self-pay | Admitting: Family Medicine

## 2024-11-11 ENCOUNTER — Ambulatory Visit: Payer: Medicare (Managed Care) | Admitting: Family Medicine

## 2024-11-11 ENCOUNTER — Telehealth: Payer: Self-pay | Admitting: Family Medicine

## 2024-11-11 VITALS — BP 114/71 | HR 87 | Ht 63.0 in | Wt 135.0 lb

## 2024-11-11 DIAGNOSIS — E1140 Type 2 diabetes mellitus with diabetic neuropathy, unspecified: Secondary | ICD-10-CM

## 2024-11-11 DIAGNOSIS — R531 Weakness: Secondary | ICD-10-CM

## 2024-11-11 DIAGNOSIS — I6930 Unspecified sequelae of cerebral infarction: Secondary | ICD-10-CM

## 2024-11-11 DIAGNOSIS — R569 Unspecified convulsions: Secondary | ICD-10-CM

## 2024-11-11 DIAGNOSIS — M25551 Pain in right hip: Secondary | ICD-10-CM

## 2024-11-11 DIAGNOSIS — I10 Essential (primary) hypertension: Secondary | ICD-10-CM | POA: Diagnosis not present

## 2024-11-11 DIAGNOSIS — E119 Type 2 diabetes mellitus without complications: Secondary | ICD-10-CM

## 2024-11-11 DIAGNOSIS — R270 Ataxia, unspecified: Secondary | ICD-10-CM

## 2024-11-11 DIAGNOSIS — M961 Postlaminectomy syndrome, not elsewhere classified: Secondary | ICD-10-CM

## 2024-11-11 DIAGNOSIS — Z720 Tobacco use: Secondary | ICD-10-CM

## 2024-11-11 DIAGNOSIS — M47816 Spondylosis without myelopathy or radiculopathy, lumbar region: Secondary | ICD-10-CM

## 2024-11-11 DIAGNOSIS — F3289 Other specified depressive episodes: Secondary | ICD-10-CM

## 2024-11-11 DIAGNOSIS — E782 Mixed hyperlipidemia: Secondary | ICD-10-CM | POA: Diagnosis not present

## 2024-11-11 DIAGNOSIS — M545 Low back pain, unspecified: Secondary | ICD-10-CM

## 2024-11-11 DIAGNOSIS — G8929 Other chronic pain: Secondary | ICD-10-CM

## 2024-11-11 DIAGNOSIS — I739 Peripheral vascular disease, unspecified: Secondary | ICD-10-CM

## 2024-11-11 DIAGNOSIS — M5417 Radiculopathy, lumbosacral region: Secondary | ICD-10-CM

## 2024-11-11 DIAGNOSIS — M79606 Pain in leg, unspecified: Secondary | ICD-10-CM

## 2024-11-11 DIAGNOSIS — Z8673 Personal history of transient ischemic attack (TIA), and cerebral infarction without residual deficits: Secondary | ICD-10-CM

## 2024-11-11 DIAGNOSIS — R7989 Other specified abnormal findings of blood chemistry: Secondary | ICD-10-CM

## 2024-11-11 MED ORDER — PREGABALIN 150 MG PO CAPS: 150.0000 mg | ORAL_CAPSULE | Freq: Two times a day (BID) | ORAL | 3 refills | Status: AC

## 2024-11-11 NOTE — Telephone Encounter (Signed)
 Reviewed message from pharmacy and prescription refill record  Patient's care giver requested 150mg  tabs instead of 75mg  tablets to use pill burden.

## 2024-11-11 NOTE — Progress Notes (Signed)
 "  Established Patient Office Visit  Patient ID: [name], male    DOB: 1946/06/08  Age: 79 y.o. MRN: [id number] PCP: Sharma Coyer, MD  Chief Complaint  Patient presents with   Medical Management of Chronic Issues    Patient reports legs swollen and feet pain due to neuropathy   Hypertension   Diabetes    Subjective:     HPI  Discussed the use of AI scribe software for clinical note transcription with the patient, who gave verbal consent to proceed.  History of Present Illness [name] is a 79 year old male with chronic hypertension, diabetes, and peripheral arterial disease who presents for chronic follow-up and pre-surgical evaluation. He is accompanied by a family member, possibly a caregiver.  He is scheduled for cervical decompression and fusion surgery at C5-C6 and C6-C7. He experiences ongoing neuropathy and pain, particularly in his lower back and feet, which is constant and affects his daily life. He has frequent falls attributed to weakness and instability in his legs. He uses a walker and a wheelchair for mobility, with family assistance for daily activities.  He reports swollen eyelids and significant neuropathy pain, especially in his feet and lower back. Falls occur almost daily due to leg weakness. His family notes that medication changes often result in increased falls and weakness.  He was started on primidone 50 mg nightly for tremors, increased to 100 mg nightly, with no significant improvement. He continues to take Seroquel , Celexa , and Lyrica . His family is concerned about the impact of medication changes on his fall risk.  His feet hurt significantly, especially in the toes, disrupting sleep. He also reports swelling in his feet and legs.  He has a history of peripheral arterial disease and follows up with a vascular specialist. His last A1c was 5.4, indicating good diabetes control. He is currently taking Lasix  20 mg daily.  His family is  concerned about his frequent falls and safety at home. Safety bars and a hospital bed have been installed to assist with mobility. Evaluated by neurosurgery for cercial decompression and fusion of C5-C6 and C6-C7,bone denisty scan was ordered, will need to hold ASA and plavix  for 1 week prior to surgery and additional 2 weeks of holding plavix  after surgery, plan to resume ASA 1 week after  surgery   Neuro visit in Dec, started primidone 50mg  nightly and then increased to 100mg  nightly for tremors, continue seroquel ,celexa , lyrica   Patient Active Problem List   Diagnosis Date Noted   Atherosclerosis of native arteries of extremity with rest pain (HCC) 05/23/2024   Renal insufficiency 05/11/2024   History of ischemic cerebrovascular accident (CVA) with residual deficit 12/31/2023   Tobacco dependence 12/31/2023   Type 2 diabetes mellitus with diabetic neuropathy, without long-term current use of insulin (HCC) 12/31/2023   Leg wound, left 02/10/2022   Overweight (BMI 25.0-29.9) 02/08/2022   Diabetes mellitus type 2, diet-controlled (HCC) 02/08/2022   Tetrahydrocannabinol (THC) use disorder, moderate, dependence (HCC) 02/07/2022   Hypokalemia 02/07/2022   Polysubstance abuse (HCC) 02/07/2022   Parotid abscess 04/05/2021   Overdose of benzodiazepine 10/16/2020   Bipolar depression (HCC) 10/16/2020   Acute cystitis with hematuria    Weakness    Benign prostatic hyperplasia without lower urinary tract symptoms 12/31/2019   Ataxia 03/09/2019   Loss of peripheral visual field, bilateral 03/09/2019   Seizure-like activity (HCC) 03/09/2019   Hyperlipidemia 11/25/2018   Bilateral carotid artery stenosis 11/25/2018   Failed back syndrome, lumbar 07/23/2018  Nephrolithiasis 07/08/2018   History of CVA (cerebrovascular accident) 05/22/2018   Visual disturbance 05/22/2018   Late effect of cerebrovascular accident (CVA) 03/12/2018   PAD (peripheral artery disease) 04/20/2017   Chronic hepatitis C  without hepatic coma (HCC) 03/12/2017   Pain and swelling of lower extremity 01/14/2016   Chronic hip pain (Right) 01/14/2016   Osteoarthritis of hip (Right) 01/14/2016   Generalized anxiety disorder 01/14/2016   Anxiety and depression 01/14/2016   Lumbar facet syndrome 01/14/2016   Clinical depression 01/11/2016   Essential hypertension 01/11/2016   Insomnia, psychophysiological 01/11/2016   Current tobacco use 01/11/2016   Chronic low back pain (Location of Primary Source of Pain) (Central) 01/11/2016   Chronic pain 01/11/2016   Lumbar spondylosis 01/11/2016   Long term current use of opiate analgesic 01/11/2016   Long term prescription opiate use 01/11/2016   Opiate use (15 MME/Day) 01/11/2016   Encounter for therapeutic drug level monitoring 01/11/2016   Encounter for pain management planning 01/11/2016   Failed back surgical syndrome (S/P anterior fusion at L5-S1) 01/11/2016   Lumbar facet arthropathy 01/11/2016   Lumbosacral radiculitis 08/16/2014   DDD (degenerative disc disease), lumbar 08/16/2014   Discogenic low back pain 04/27/2012      ROS    Objective:     BP 114/71 (BP Location: Left Arm, Patient Position: Sitting, Cuff Size: Normal)   Pulse 87   Ht 5' 3 (1.6 m)   Wt 135 lb (61.2 kg)   SpO2 100%   BMI 23.91 kg/m  BP Readings from Last 3 Encounters:  11/11/24 114/71  09/30/24 114/73  09/10/24 126/78   Wt Readings from Last 3 Encounters:  11/11/24 135 lb (61.2 kg)  08/10/24 130 lb (59 kg)  07/01/24 130 lb (59 kg)      Physical Exam Vitals reviewed.  Constitutional:      General: He is not in acute distress.    Appearance: Normal appearance. He is not ill-appearing.  Cardiovascular:     Rate and Rhythm: Normal rate and regular rhythm.  Pulmonary:     Effort: Pulmonary effort is normal. No respiratory distress.     Breath sounds: No wheezing, rhonchi or rales.  Neurological:     Mental Status: He is alert and oriented to person, place, and  time.  Psychiatric:        Mood and Affect: Mood normal.        Behavior: Behavior normal.      Results for orders placed or performed in visit on 11/11/24  Comprehensive metabolic panel with GFR  Result Value Ref Range   Glucose 108 (H) 70 - 99 mg/dL   BUN 57 (H) 8 - 27 mg/dL   Creatinine, Ser 7.58 (H) 0.76 - 1.27 mg/dL   eGFR 27 (L) >40 fO/fpw/8.26   BUN/Creatinine Ratio 24 10 - 24   Sodium 145 (H) 134 - 144 mmol/L   Potassium 5.2 3.5 - 5.2 mmol/L   Chloride 97 96 - 106 mmol/L   CO2 26 20 - 29 mmol/L   Calcium  10.9 (H) 8.6 - 10.2 mg/dL   Total Protein 7.8 6.0 - 8.5 g/dL   Albumin 4.6 3.8 - 4.8 g/dL   Globulin, Total 3.2 1.5 - 4.5 g/dL   Bilirubin Total <9.7 0.0 - 1.2 mg/dL   Alkaline Phosphatase 80 47 - 123 IU/L   AST 25 0 - 40 IU/L   ALT 15 0 - 44 IU/L  Hemoglobin A1c  Result Value Ref Range   Hgb  A1c MFr Bld 5.3 4.8 - 5.6 %   Est. average glucose Bld gHb Est-mCnc 105 mg/dL  Vitamin B12  Result Value Ref Range   Vitamin B-12 854 232 - 1,245 pg/mL  Lipid panel  Result Value Ref Range   Cholesterol, Total 142 100 - 199 mg/dL   Triglycerides 876 0 - 149 mg/dL   HDL 56 >60 mg/dL   VLDL Cholesterol Cal 22 5 - 40 mg/dL   LDL Chol Calc (NIH) 64 0 - 99 mg/dL   Chol/HDL Ratio 2.5 0.0 - 5.0 ratio  TSH + free T4  Result Value Ref Range   TSH 5.890 (H) 0.450 - 4.500 uIU/mL   Free T4 0.64 (L) 0.82 - 1.77 ng/dL    Last metabolic panel Lab Results  Component Value Date   GLUCOSE 108 (H) 11/11/2024   NA 145 (H) 11/11/2024   K 5.2 11/11/2024   CL 97 11/11/2024   CO2 26 11/11/2024   BUN 57 (H) 11/11/2024   CREATININE 2.41 (H) 11/11/2024   EGFR 27 (L) 11/11/2024   CALCIUM  10.9 (H) 11/11/2024   PHOS 3.2 10/10/2020   PROT 7.8 11/11/2024   ALBUMIN 4.6 11/11/2024   LABGLOB 3.2 11/11/2024   BILITOT <0.2 11/11/2024   ALKPHOS 80 11/11/2024   AST 25 11/11/2024   ALT 15 11/11/2024   ANIONGAP 12 05/24/2024   Last lipids Lab Results  Component Value Date   CHOL 142  11/11/2024   HDL 56 11/11/2024   LDLCALC 64 11/11/2024   TRIG 123 11/11/2024   CHOLHDL 2.5 11/11/2024   Last hemoglobin A1c Lab Results  Component Value Date   HGBA1C 5.3 11/11/2024   Last thyroid  functions Lab Results  Component Value Date   TSH 5.890 (H) 11/11/2024   FREET4 0.64 (L) 11/11/2024   Last vitamin D No results found for: 25OHVITD2, 25OHVITD3, VD25OH    The ASCVD Risk score (Arnett DK, et al., 2019) failed to calculate for the following reasons:   Risk score cannot be calculated because patient has a medical history suggesting prior/existing ASCVD   * - Cholesterol units were assumed  Outpatient Encounter Medications as of 11/11/2024  Medication Sig   acetaminophen  (TYLENOL ) 500 MG tablet Oral   amLODipine  (NORVASC ) 2.5 MG tablet Take 1 tablet (2.5 mg total) by mouth every morning.   aspirin  EC 81 MG tablet Take 1 tablet (81 mg total) by mouth daily. Swallow whole.   atorvastatin  (LIPITOR ) 10 MG tablet Take 1 tablet (10 mg total) by mouth daily.   Blood Glucose Monitoring Suppl (HEALTHPRO BLOOD GLUCOSE MONITO) w/Device KIT MISCELLANEOUS   Cholecalciferol (VITAMIN D-1000 MAX ST) 25 MCG (1000 UT) tablet Take 1,000 Units by mouth daily.   citalopram  (CELEXA ) 40 MG tablet Take 40 mg by mouth daily.   clopidogrel  (PLAVIX ) 75 MG tablet Take 1 tablet (75 mg total) by mouth daily.   divalproex (DEPAKOTE) 250 MG DR tablet Take 250 mg by mouth 2 (two) times daily.   Docusate Sodium  (DSS) 100 MG CAPS Oral   DULoxetine  (CYMBALTA ) 60 MG capsule Take 1 capsule (60 mg total) by mouth daily.   furosemide  (LASIX ) 20 MG tablet TAKE 1 TABLET BY MOUTH EVERY DAY   Incontinence Supplies (MALE URINAL) MISC 1 Device by Does not apply route as needed.   JARDIANCE  25 MG TABS tablet Take 1 tablet (25 mg total) by mouth daily.   metFORMIN  (GLUCOPHAGE ) 500 MG tablet Take 1 tablet (500 mg total) by mouth 2 (two) times daily with  a meal.   Multiple Vitamins-Minerals (CENTRUM SILVER  50+MEN) TABS Take 1 tablet by mouth daily.   Nutritional Supplements (ENSURE ACTIVE HIGH PROTEIN) LIQD TAKE 1 BOTTLE BY MOUTH 3 (THREE) TIMES DAILY.   primidone (MYSOLINE) 50 MG tablet Take by mouth.   QUEtiapine  (SEROQUEL ) 100 MG tablet Take 300 mg by mouth at bedtime.   tamsulosin  (FLOMAX ) 0.4 MG CAPS capsule Take 1 capsule (0.4 mg total) by mouth at bedtime.   tiZANidine  (ZANAFLEX ) 4 MG capsule TAKE 1 CAPSULE BY MOUTH THREE TIMES A DAY   traMADol  (ULTRAM ) 50 MG tablet Take 1 tablet (50 mg total) by mouth every 6 (six) hours as needed.   [DISCONTINUED] folic acid  (FOLVITE ) 1 MG tablet Take 1 tablet (1 mg total) by mouth daily.   [DISCONTINUED] pregabalin  (LYRICA ) 150 MG capsule TAKE 1 CAPSULE BY MOUTH TWICE A DAY   pregabalin  (LYRICA ) 150 MG capsule Take 1 capsule (150 mg total) by mouth 2 (two) times daily.   QUEtiapine  (SEROQUEL ) 200 MG tablet Take 200 mg by mouth at bedtime. (Patient not taking: Reported on 11/11/2024)   No facility-administered encounter medications on file as of 11/11/2024.       Assessment & Plan:   Problem List Items Addressed This Visit     Ataxia   Relevant Orders   Ambulatory referral to Home Health   Chronic hip pain (Right) (Chronic)   Relevant Medications   primidone (MYSOLINE) 50 MG tablet   pregabalin  (LYRICA ) 150 MG capsule   Other Relevant Orders   Ambulatory referral to Home Health   Chronic low back pain (Location of Primary Source of Pain) (Central) (Chronic)   Relevant Medications   primidone (MYSOLINE) 50 MG tablet   pregabalin  (LYRICA ) 150 MG capsule   Other Relevant Orders   Ambulatory referral to Home Health   Chronic pain (Chronic)   Relevant Medications   primidone (MYSOLINE) 50 MG tablet   pregabalin  (LYRICA ) 150 MG capsule   Other Relevant Orders   Ambulatory referral to Home Health   Clinical depression   Relevant Orders   Ambulatory referral to Home Health   Current tobacco use   Relevant Orders   Ambulatory referral to  Home Health   Diabetes mellitus type 2, diet-controlled (HCC) (Chronic)   Relevant Orders   Hemoglobin A1c (Completed)   Ambulatory referral to Home Health   Essential hypertension - Primary   Relevant Orders   Comprehensive metabolic panel with GFR (Completed)   Ambulatory referral to Home Health   Failed back surgical syndrome (S/P anterior fusion at L5-S1) (Chronic)   Relevant Orders   Ambulatory referral to Home Health   Failed back syndrome, lumbar   Relevant Orders   Ambulatory referral to Home Health   History of CVA (cerebrovascular accident)   Relevant Orders   Ambulatory referral to Home Health   Hyperlipidemia   Relevant Orders   Lipid panel (Completed)   Ambulatory referral to Home Health   Late effect of cerebrovascular accident (CVA)   Relevant Orders   Ambulatory referral to Home Health   Lumbar facet arthropathy (Chronic)   Relevant Orders   Ambulatory referral to Home Health   Lumbar facet syndrome (Chronic)   Relevant Orders   Ambulatory referral to Home Health   Lumbar spondylosis (Chronic)   Relevant Orders   Ambulatory referral to Home Health   Lumbosacral radiculitis (Chronic)   Relevant Medications   primidone (MYSOLINE) 50 MG tablet   QUEtiapine  (SEROQUEL ) 100 MG tablet  pregabalin  (LYRICA ) 150 MG capsule   Other Relevant Orders   Ambulatory referral to Home Health   PAD (peripheral artery disease)   Relevant Medications   pregabalin  (LYRICA ) 150 MG capsule   Other Relevant Orders   Ambulatory referral to Home Health   Pain and swelling of lower extremity (Chronic)   Relevant Orders   Ambulatory referral to Home Health   Seizure-like activity [facility])   Relevant Orders   Ambulatory referral to Home Health   Type 2 diabetes mellitus with diabetic neuropathy, without long-term current use of insulin (HCC) (Chronic)   Relevant Medications   pregabalin  (LYRICA ) 150 MG capsule   Weakness   Relevant Orders   Ambulatory referral to Home Health    Other Visit Diagnoses       Elevated vitamin B12 level       Relevant Orders   Vitamin B12 (Completed)   Ambulatory referral to Home Health     Abnormal TSH       Relevant Orders   TSH + free T4 (Completed)   Ambulatory referral to Home Health       Assessment and Plan Assessment & Plan Type 2 diabetes mellitus with diabetic neuropathy Diabetes is well-controlled with an A1c of 5.4. Neuropathy symptoms persist, primarily affecting the feet, causing significant pain and sleep disturbances. Lyrica  has been used for neuropathy management, but symptoms remain severe. Surgery is anticipated to alleviate some neuropathy symptoms by addressing cervical spine issues. - Continue current diabetes management regimen. - Continue Lyrica  150 mg twice daily. - Ordered A1c test.  Essential hypertension Chronic  Hypertension management is ongoing. - continue amlodipine  2.5mg  daily   Peripheral artery disease Chronic  Contributing to lower extremity pain and circulation issues. He is under the care of a vascular surgeon. - Continue follow-up with vascular surgeon.  Chronic pain and lower extremity weakness  PAD  Limited Mobility  Chronic pain and weakness in the lower extremities are significant issues, exacerbated by neuropathy and peripheral artery disease. He experiences frequent falls, which are concerning for safety. Surgery is planned to address cervical spine issues, which may alleviate some symptoms. Post-surgery, he will be on strong narcotics for three months, followed by a transition to naproxen. Home health support is needed for mobility and daily activities. - Proceed with cervical decompression surgery. - Hold aspirin  and Plavix  one week prior to surgery and resume aspirin  one week post-surgery. - Hold Plavix  for two weeks post-surgery. - Arrange for home health aide, physical therapy, and nursing support post-surgery. - Consider electric wheelchair for mobility assistance. -  Discussed potential need for rehabilitation post-surgery.  Mixed hyperlipidemia Chronic  - Ordered lipid panel. -continue atorvastatin  10mg  daily   Abnormal TSH Chronic  Previous abnormal TSH levels noted. Re-evaluation is necessary to assess current thyroid  function. - Ordered thyroid  function tests.  Current tobacco use Chronic  Continued tobacco use is a concern, particularly in relation to post-surgical recovery and overall health. - Encouraged smoking cessation to improve surgical recovery and overall health.    Return in about 3 months (around 02/09/2025) for Chronic F/U.    Rockie Agent, MD [facility]  "

## 2024-11-11 NOTE — Patient Instructions (Signed)
 To keep you healthy, please keep in mind the following health maintenance items that you are due for:   Health Maintenance Due  Topic Date Due   OPHTHALMOLOGY EXAM  Never done   Zoster Vaccines- Shingrix (1 of 2) Never done   COVID-19 Vaccine (2 - Pfizer risk series) 09/08/2020   Medicare Annual Wellness (AWV)  01/22/2022   Lung Cancer Screening  09/18/2023     Best Wishes,   Dr. Lang

## 2024-11-11 NOTE — Telephone Encounter (Signed)
 Copied from CRM #[id number]. Topic: Clinical - Prescription Issue >> Nov 11, 2024 11:42 AM Olam RAMAN wrote: Reason for CRM:  therisa from [facility] [phone] pregabalin  (LYRICA ) 150 MG capsule was flagged multiple and was p.u 75 mg on1/17 from a different provider zackary potter and wanted dr Marcine to [facility]

## 2024-11-12 LAB — COMPREHENSIVE METABOLIC PANEL WITH GFR
ALT: 15 IU/L (ref 0–44)
AST: 25 IU/L (ref 0–40)
Albumin: 4.6 g/dL (ref 3.8–4.8)
Alkaline Phosphatase: 80 IU/L (ref 47–123)
BUN/Creatinine Ratio: 24 (ref 10–24)
BUN: 57 mg/dL — ABNORMAL HIGH (ref 8–27)
Bilirubin Total: <0.2 mg/dL (ref 0.0–1.2)
CO2: 26 mmol/L (ref 20–29)
Calcium: 10.9 mg/dL — ABNORMAL HIGH (ref 8.6–10.2)
Chloride: 97 mmol/L (ref 96–106)
Creatinine, Ser: 2.41 mg/dL — ABNORMAL HIGH (ref 0.76–1.27)
Globulin, Total: 3.2 g/dL (ref 1.5–4.5)
Glucose: 108 mg/dL — ABNORMAL HIGH (ref 70–99)
Potassium: 5.2 mmol/L (ref 3.5–5.2)
Sodium: 145 mmol/L — ABNORMAL HIGH (ref 134–144)
Total Protein: 7.8 g/dL (ref 6.0–8.5)
eGFR: 27 mL/min/1.73 — ABNORMAL LOW

## 2024-11-12 LAB — LIPID PANEL
Chol/HDL Ratio: 2.5 ratio (ref 0.0–5.0)
Cholesterol, Total: 142 mg/dL (ref 100–199)
HDL: 56 mg/dL
LDL Chol Calc (NIH): 64 mg/dL (ref 0–99)
Triglycerides: 123 mg/dL (ref 0–149)
VLDL Cholesterol Cal: 22 mg/dL (ref 5–40)

## 2024-11-12 LAB — VITAMIN B12: Vitamin B-12: 854 pg/mL (ref 232–1245)

## 2024-11-12 LAB — TSH+FREE T4
Free T4: 0.64 ng/dL — ABNORMAL LOW (ref 0.82–1.77)
TSH: 5.89 u[IU]/mL — ABNORMAL HIGH (ref 0.450–4.500)

## 2024-11-12 LAB — HEMOGLOBIN A1C
Est. average glucose Bld gHb Est-mCnc: 105 mg/dL
Hgb A1c MFr Bld: 5.3 % (ref 4.8–5.6)

## 2024-11-18 ENCOUNTER — Ambulatory Visit: Payer: Self-pay | Admitting: Family Medicine

## 2024-11-18 DIAGNOSIS — N184 Chronic kidney disease, stage 4 (severe): Secondary | ICD-10-CM

## 2024-11-18 DIAGNOSIS — E039 Hypothyroidism, unspecified: Secondary | ICD-10-CM

## 2024-11-18 MED ORDER — LEVOTHYROXINE SODIUM 50 MCG PO TABS: 50.0000 ug | ORAL_TABLET | Freq: Every day | ORAL | 3 refills | Status: AC

## 2024-11-25 ENCOUNTER — Other Ambulatory Visit: Payer: Self-pay | Admitting: Family Medicine

## 2025-02-23 ENCOUNTER — Ambulatory Visit: Payer: Medicare (Managed Care) | Admitting: Family Medicine

## 2025-03-31 ENCOUNTER — Encounter (INDEPENDENT_AMBULATORY_CARE_PROVIDER_SITE_OTHER): Payer: Medicare (Managed Care)

## 2025-03-31 ENCOUNTER — Ambulatory Visit (INDEPENDENT_AMBULATORY_CARE_PROVIDER_SITE_OTHER): Payer: Medicare (Managed Care) | Admitting: Vascular Surgery
